# Patient Record
Sex: Female | Born: 1935 | Race: White | Hispanic: No | Smoking: Former smoker
Health system: Southern US, Community
[De-identification: ages and names within clinical notes are randomized; demographics above are authoritative.]

## PROBLEM LIST (undated history)

## (undated) DIAGNOSIS — F32A Depression, unspecified: Secondary | ICD-10-CM

## (undated) DIAGNOSIS — J189 Pneumonia, unspecified organism: Secondary | ICD-10-CM

## (undated) DIAGNOSIS — F329 Major depressive disorder, single episode, unspecified: Secondary | ICD-10-CM

## (undated) DIAGNOSIS — I2510 Atherosclerotic heart disease of native coronary artery without angina pectoris: Secondary | ICD-10-CM

## (undated) DIAGNOSIS — I209 Angina pectoris, unspecified: Secondary | ICD-10-CM

## (undated) DIAGNOSIS — R011 Cardiac murmur, unspecified: Secondary | ICD-10-CM

## (undated) DIAGNOSIS — M109 Gout, unspecified: Secondary | ICD-10-CM

## (undated) DIAGNOSIS — C9000 Multiple myeloma not having achieved remission: Principal | ICD-10-CM

## (undated) DIAGNOSIS — J42 Unspecified chronic bronchitis: Secondary | ICD-10-CM

## (undated) DIAGNOSIS — J449 Chronic obstructive pulmonary disease, unspecified: Secondary | ICD-10-CM

## (undated) DIAGNOSIS — Z9289 Personal history of other medical treatment: Secondary | ICD-10-CM

## (undated) DIAGNOSIS — M549 Dorsalgia, unspecified: Secondary | ICD-10-CM

## (undated) DIAGNOSIS — G8929 Other chronic pain: Secondary | ICD-10-CM

## (undated) DIAGNOSIS — I4891 Unspecified atrial fibrillation: Secondary | ICD-10-CM

## (undated) DIAGNOSIS — R0602 Shortness of breath: Secondary | ICD-10-CM

## (undated) DIAGNOSIS — I219 Acute myocardial infarction, unspecified: Secondary | ICD-10-CM

## (undated) DIAGNOSIS — N189 Chronic kidney disease, unspecified: Secondary | ICD-10-CM

## (undated) DIAGNOSIS — K644 Residual hemorrhoidal skin tags: Secondary | ICD-10-CM

## (undated) DIAGNOSIS — I10 Essential (primary) hypertension: Secondary | ICD-10-CM

## (undated) DIAGNOSIS — I25810 Atherosclerosis of coronary artery bypass graft(s) without angina pectoris: Secondary | ICD-10-CM

## (undated) DIAGNOSIS — E7800 Pure hypercholesterolemia, unspecified: Secondary | ICD-10-CM

## (undated) HISTORY — PX: APPENDECTOMY: SHX54

## (undated) HISTORY — PX: CHOLECYSTECTOMY: SHX55

## (undated) HISTORY — PX: ABDOMINAL HYSTERECTOMY: SHX81

## (undated) HISTORY — DX: Multiple myeloma not having achieved remission: C90.00

## (undated) HISTORY — PX: CATARACT EXTRACTION W/ INTRAOCULAR LENS  IMPLANT, BILATERAL: SHX1307

---

## 1968-12-06 HISTORY — PX: ECTOPIC PREGNANCY SURGERY: SHX613

## 1998-01-03 ENCOUNTER — Ambulatory Visit: Admission: RE | Admit: 1998-01-03 | Discharge: 1998-01-03 | Payer: Self-pay | Admitting: Pulmonary Disease

## 1998-08-23 ENCOUNTER — Encounter: Payer: Self-pay | Admitting: Emergency Medicine

## 1998-08-23 ENCOUNTER — Inpatient Hospital Stay (HOSPITAL_COMMUNITY): Admission: EM | Admit: 1998-08-23 | Discharge: 1998-08-26 | Payer: Self-pay | Admitting: Emergency Medicine

## 1999-03-19 ENCOUNTER — Other Ambulatory Visit: Admission: RE | Admit: 1999-03-19 | Discharge: 1999-03-19 | Payer: Self-pay | Admitting: *Deleted

## 1999-04-22 ENCOUNTER — Inpatient Hospital Stay (HOSPITAL_COMMUNITY): Admission: EM | Admit: 1999-04-22 | Discharge: 1999-04-24 | Payer: Self-pay | Admitting: Emergency Medicine

## 1999-04-22 ENCOUNTER — Encounter: Payer: Self-pay | Admitting: Interventional Cardiology

## 1999-06-05 ENCOUNTER — Inpatient Hospital Stay (HOSPITAL_COMMUNITY): Admission: AD | Admit: 1999-06-05 | Discharge: 1999-06-07 | Payer: Self-pay | Admitting: Interventional Cardiology

## 1999-06-05 ENCOUNTER — Encounter: Payer: Self-pay | Admitting: Interventional Cardiology

## 1999-06-13 ENCOUNTER — Emergency Department (HOSPITAL_COMMUNITY): Admission: EM | Admit: 1999-06-13 | Discharge: 1999-06-13 | Payer: Self-pay | Admitting: Emergency Medicine

## 2000-06-13 ENCOUNTER — Emergency Department (HOSPITAL_COMMUNITY): Admission: EM | Admit: 2000-06-13 | Discharge: 2000-06-13 | Payer: Self-pay | Admitting: Internal Medicine

## 2000-08-03 ENCOUNTER — Other Ambulatory Visit: Admission: RE | Admit: 2000-08-03 | Discharge: 2000-08-03 | Payer: Self-pay | Admitting: *Deleted

## 2001-08-06 ENCOUNTER — Encounter: Payer: Self-pay | Admitting: Cardiology

## 2001-08-06 ENCOUNTER — Inpatient Hospital Stay (HOSPITAL_COMMUNITY): Admission: EM | Admit: 2001-08-06 | Discharge: 2001-08-08 | Payer: Self-pay | Admitting: Emergency Medicine

## 2001-08-06 ENCOUNTER — Encounter: Payer: Self-pay | Admitting: Interventional Cardiology

## 2001-08-07 ENCOUNTER — Encounter: Payer: Self-pay | Admitting: Interventional Cardiology

## 2002-09-28 ENCOUNTER — Encounter: Admission: RE | Admit: 2002-09-28 | Discharge: 2002-09-28 | Payer: Self-pay | Admitting: Internal Medicine

## 2002-09-28 ENCOUNTER — Encounter: Payer: Self-pay | Admitting: Internal Medicine

## 2003-04-08 HISTORY — PX: CORONARY ARTERY BYPASS GRAFT: SHX141

## 2003-04-08 HISTORY — PX: CORONARY ANGIOPLASTY WITH STENT PLACEMENT: SHX49

## 2003-06-23 ENCOUNTER — Emergency Department (HOSPITAL_COMMUNITY): Admission: EM | Admit: 2003-06-23 | Discharge: 2003-06-23 | Payer: Self-pay | Admitting: Family Medicine

## 2003-06-30 ENCOUNTER — Emergency Department (HOSPITAL_COMMUNITY): Admission: EM | Admit: 2003-06-30 | Discharge: 2003-06-30 | Payer: Self-pay | Admitting: Family Medicine

## 2003-08-11 ENCOUNTER — Emergency Department (HOSPITAL_COMMUNITY): Admission: EM | Admit: 2003-08-11 | Discharge: 2003-08-11 | Payer: Self-pay | Admitting: Family Medicine

## 2004-02-06 ENCOUNTER — Inpatient Hospital Stay (HOSPITAL_COMMUNITY): Admission: EM | Admit: 2004-02-06 | Discharge: 2004-02-23 | Payer: Self-pay | Admitting: Emergency Medicine

## 2008-03-03 ENCOUNTER — Inpatient Hospital Stay (HOSPITAL_COMMUNITY): Admission: EM | Admit: 2008-03-03 | Discharge: 2008-03-07 | Payer: Self-pay | Admitting: Emergency Medicine

## 2008-03-05 ENCOUNTER — Encounter (INDEPENDENT_AMBULATORY_CARE_PROVIDER_SITE_OTHER): Payer: Self-pay | Admitting: Cardiology

## 2008-03-06 ENCOUNTER — Encounter (INDEPENDENT_AMBULATORY_CARE_PROVIDER_SITE_OTHER): Payer: Self-pay | Admitting: Surgery

## 2009-01-26 ENCOUNTER — Encounter: Admission: RE | Admit: 2009-01-26 | Discharge: 2009-01-26 | Payer: Self-pay | Admitting: Internal Medicine

## 2010-04-27 ENCOUNTER — Encounter: Payer: Self-pay | Admitting: Internal Medicine

## 2010-04-27 ENCOUNTER — Encounter: Payer: Self-pay | Admitting: Cardiothoracic Surgery

## 2010-04-28 ENCOUNTER — Encounter: Payer: Self-pay | Admitting: Internal Medicine

## 2010-08-20 NOTE — Op Note (Signed)
[name], [name]              ACCOUNT NO.:  [id number]   MEDICAL RECORD NO.:  [id number]          PATIENT TYPE:  INP   LOCATION:  3309                         FACILITY:  [facility]   PHYSICIAN:  Currie Paris, M.D.DATE OF BIRTH:  11/13/1931   DATE OF PROCEDURE:  03/06/2008  DATE OF DISCHARGE:                               OPERATIVE REPORT   PREOPERATIVE DIAGNOSIS:  Acute calculus cholecystitis.   POSTOPERATIVE DIAGNOSIS:  Acute calculus cholecystitis.   OPERATION:  Laparoscopic cholecystectomy with operative cholangiogram.   SURGEON:  Currie Paris, MD   ASSISTANT:  Letha Cape, PA   ANESTHESIA:  General.   CLINICAL HISTORY:  This is a 77 year old lady with coronary artery  disease, came in with what initially thought to be some chest pain, but  on further evaluation, was more epigastric, right upper quadrant, and  right shoulder.  Her cardiac workup was okay, and she was found to have  gallstones with some right upper quadrant tenderness.  It was felt that  her symptoms were biliary in origin.  After discussion with the patient  and her family, she elected to proceed to cholecystectomy.   DESCRIPTION OF PROCEDURE:  The patient was seen in the holding area, and  we went over the plans again, and she had no further questions.   She was taken to the operating room, and after satisfactory general  endotracheal anesthesia had been obtained, the abdomen was prepped and  draped.  The time-out was done.   I used 0.25% plain Marcaine for each incision.  I made the umbilical  incision, opened the fascia, and entered the peritoneal cavity.  There  appeared to be some omental adhesions, but I was able to get in and  place the Hasson cannula and insufflate the abdomen.   On placing the camera, I could see we had some flimsy adhesions, and I  was able to break through a thin window and gain access into the right  upper quadrant.  There were multiple other adhesions up  here.  Once I  was able to get up here, however, I was able to put a 5-mm trocar in the  right upper quadrant under direct vision.  Initially with that in place,  I could use the scissors and took down some midline adhesions to the  falciform, so that I had the midline opened up and could put a 10/11  trocar there under direct vision.  This was next done.  Then, with the  camera in the epigastric port, I was able to take the remaining midline  adhesions down enough to get good visualization of the umbilical port,  freed that up to be sure there was no bowel involved with these  adhesions that we had a free entry into the peritoneal cavity.   With that done, I put another 5-mm port laterally.  The patient was  placed in reverse Trendelenburg and tilted to the left.   The gallbladder was removed.  I opened the peritoneum over the cystic  duct and identified both the cystic duct and cystic artery, dissected  out a  nice window on a long segment of each.  The cystic duct was  clipped once as was the artery.  The cystic duct was then opened, and a  Cook catheter introduced, and an operative cholangiogram done, which  appeared normal to me.   The cystic duct catheter was removed, and 3 clips placed on the stay  side of the cystic duct.  Two additional clips were placed on the cystic  artery, and both were divided.  A small posterior branch of the cystic  artery was also identified and clipped as we removed the gallbladder.   The gallbladder was removed from below to above with coagulation current  of the cautery.  Once it was removed, I placed it in the bag.  We  irrigated.  I cauterized a few oozing places on the bed of the  gallbladder.  Once everything was dry and irrigated, I went ahead and  brought the gallbladder out the umbilical port.  The lateral ports were  removed under direct vision.  There was no bleeding.  The camera was  placed back in the epigastric port, and I reinspected to  make sure we  had had no injuries on entering the abdomen, and everything appeared to  be okay.  The umbilical site was closed with a purse-string.  The  abdomen was deflated through the epigastric site.  Skin was closed with  4-0 Monocryl subcuticular and Dermabond.   The patient tolerated the procedure well, and there were no  complications.  All counts were correct.      Currie Paris, M.D.  Electronically Signed     CJS/MEDQ  D:  03/06/2008  T:  03/07/2008  Job:  [id number]

## 2010-08-20 NOTE — H&P (Signed)
[name], [name]              ACCOUNT NO.:  [id number]   MEDICAL RECORD NO.:  [id number]          PATIENT TYPE:  INP   LOCATION:  3309                         FACILITY:  [facility]   PHYSICIAN:  Armanda Magic, M.D.     DATE OF BIRTH:  07/11/1931   DATE OF ADMISSION:  03/03/2008  DATE OF DISCHARGE:                              HISTORY & PHYSICAL   CHIEF COMPLAINT:  Chest pain.   HISTORY OF PRESENT ILLNESS:  [name] is a 77 year old female with 24  hours of constant left back sharp pain and substernal chest pressure.  She took sublingual nitroglycerin at home which did not help.  She came  to the ER and was placed on IV nitroglycerin and IV heparin drip that  helped somewhat.  She also complains of nausea, vomiting, and diarrhea  for 24 hours.   REVIEW OF SYSTEMS:  Otherwise negative.   PAST MEDICAL HISTORY:  She has coronary artery disease and undergone  bypass surgery in the past.  She has had multiple interventions of her  right coronary artery.  She has a history of hypertension, dyslipidemia,  asthma, COPD, obesity, and gout.   SOCIAL HISTORY:  She lives in [place] with her daughter and smokes 1  pack of cigarettes per day.  No alcohol or illicit drug use.   FAMILY HISTORY:  Mother died of a CVA.  Father died in his 8s of an  aneurysm.   MEDICATIONS:  1. Omega-3 Fish Oil 1000 mg 2 tablets daily.  2. Hydrochlorothiazide 25 mg a day.  3. Potassium gluconate 595 mg over-the-counter 2 tablets daily.  4. Zetia 10 mg nightly.  5. Restoril 15 mg nightly.  6. Coated aspirin 325 mg 2 tablets daily.  7. Colchicine 0.6 mg daily.   ALLERGIES:  CODEINE.   PHYSICAL EXAMINATION:  VITAL SIGNS:  Temperature 97.9, pulse 52, blood  pressure 136/78, respirations 28, O2 saturation 99% on 2L.  HEENT:  Grossly normal.  Conjunctivae normal.  Nares without drainage.  NECK: .  No carotid or subclavian bruits.  No JVD or thyromegaly.  CHEST:  Clear to auscultation bilaterally.  No  wheezing or rhonchi.  HEART:  Regular rate and rhythm.  No gallops or murmur.  ABDOMEN:  Good bowel sounds.  Nontender.  Nondistended.  No masses or  bruits.  EXTREMITY:  No lower extremity edema.  SKIN:  Warm and dry.  NEUROLOGIC:  Cranial nerves II through XII were grossly intact.  PSYCHIATRIC:  Normal mood and affect.   Chest x-ray no active disease.   LABORATORY STUDIES:  Hemoglobin 13.9, hematocrit 41.4, and platelets  172.  White count 6.4, sodium 130, potassium 3.2, BUN 30, creatinine  1.6, myoglobin 236, troponin normal.  EKG shows T-wave inversions in  lateral leads new from November 2005.   ASSESSMENT AND PLAN:  1. Chest pain atypical.  2. Hypokalemia, repleted.  3. Coronary artery disease.  4. History of coronary artery bypass graft.  5. Asthma.  6. Hypertension.  7. Borderline diabetes mellitus.  8. Diarrhea.   We will admit the patient and perform a chest CT to rule  out pulmonary  embolism.  We placed her on IV heparin to begin with.  We wanted to  switch her over to Lovenox, but  the patient refused this Lovenox shot.  We will continue IV nitroglycerin and start her on Protonix.  Also, we  will check her C. diff.  Monitor on telemetry.      Guy Franco, P.A.      Armanda Magic, M.D.  Electronically Signed    LB/MEDQ  D:  03/03/2008  T:  03/04/2008  Job:  [id number]   cc:   Georgann Housekeeper, MD  Lyn Records, M.D.

## 2010-08-20 NOTE — Consult Note (Signed)
[name], [name]              ACCOUNT NO.:  [id number]   MEDICAL RECORD NO.:  [id number]          PATIENT TYPE:  INP   LOCATION:                               FACILITY:  [facility]   PHYSICIAN:  Ollen Gross. Vernell Morgans, M.D. DATE OF BIRTH:  06/01/1931   DATE OF CONSULTATION:  03/05/2008  DATE OF DISCHARGE:                                 CONSULTATION   We were asked to see [name] in consultation by Dr. Ewing Schlein to evaluate  her for gallstones.   CHIEF COMPLAINT:  Abdominal pain.  [name] is a 77 year old white  female who presented a couple of days ago to the emergency department  with epigastric pain radiating to her back.  She has a history of  coronary artery disease and thought it might be her heart causing this  pain.  She was admitted by the cardiologist and worked up.  The workup  has not shown any cardiac source of her pain.  She also had an  ultrasound that did show stones in her gallbladder, but no gallbladder  wall thickening or ductal dilatation.  Her white counts normal.  Her  LFTs are normal.  She is continuing to have this pain, though this is  fairly constant.   PAST MEDICAL HISTORY:  Again significant for coronary artery disease,  hypertension, cholesterol, asthma, COPD, and gout.   PAST SURGICAL HISTORY:  Significant for coronary artery bypass grafting  and hysterectomy.   MEDICATIONS:  Omega-3, fish oil, hydrochlorothiazide, potassium, Zetia,  Restoril, aspirin, and colchicine.   ALLERGIES:  CODEINE.   SOCIAL HISTORY:  She smokes a pack of cigarettes a day and denies any  alcohol use.   FAMILY HISTORY:  Noncontributory.   PHYSICAL EXAMINATION:  GENERAL:  She is elderly white female in no acute  distress.  SKIN:  Warm and dry.  No jaundice.  EYES:  Extraocular movements intact.  Pupils equal, round, and reactive  to light.  Sclerae nonicteric.  LUNGS:  Clear bilaterally with no use of accessory inspiratory muscles.  HEART:  Regular rate and rhythm with  impulse in the left chest.  ABDOMEN:  Soft with some mild right upper quadrant tenderness, but no  guarding or peritonitis.  No palpable mass or hepatosplenomegaly.  EXTREMITIES:  No cyanosis, clubbing, or edema.  Good strength in arms  and legs.  PSYCHOLOGIC:  She is alert and oriented x3 with no evidence of anxiety  or depression.   Her labs and ultrasound were as mentioned above.   ASSESSMENT AND PLAN:  This is a 77 year old white female with what  sounds like symptomatic gallstones.  This certainly could account for  her symptoms.  It sounds as though her heart has been ruled out for  source of the pain.  At this point, I think she would probably benefit  from cholecystectomy.  I have discussed her in detail the  risks and benefits of operation to remove the gallbladder as well as  some of the technical aspects and she understands and wishes to proceed.  We will tentatively plan for this for tomorrow if she  is felt to be in  adequate cardiac risk for surgery by the cardiologists.       Ollen Gross. Vernell Morgans, M.D.  Electronically Signed     PST/MEDQ  D:  03/05/2008  T:  03/06/2008  Job:  [id number]

## 2010-08-20 NOTE — Consult Note (Signed)
NAMEKORAYMA, HAGWOOD              ACCOUNT NO.:  [id number]   MEDICAL RECORD NO.:  [id number]          PATIENT TYPE:  INP   LOCATION:  3309                         FACILITY:  [facility]   PHYSICIAN:  Petra Kuba, M.D.    DATE OF BIRTH:  1932-01-10   DATE OF CONSULTATION:  03/05/2008  DATE OF DISCHARGE:                                 CONSULTATION   HISTORY:  The patient was seen at request of Armanda Magic, although  primary patient of Verdis Prime with atypical chest pain.  She is ruled  out for an MI.  Pain does go to her back and also includes in her  abdomen.  She is doing better than she was.  This pain came on all of a  sudden on Thursday, did have some diarrhea as well on Thursday but it  has decreased 3 or 4 times yesterday and 2 times today.  She has not  seen any blood.  She is not having any previous GI workup, not sure if  she ever had a colonoscopy before, if she did it was years ago.  No  previous endoscopies or ultrasound, do not know if she had gallstones.  She is not having any nausea or vomiting and is currently doing better.  At home, has had some bulging but no other GI issues and no lower bowel  complaints.   PAST MEDICAL HISTORY:  Pertinent for coronary artery disease with a CABG  and stent.  She also has hypertension, gout, and hysterectomy.   FAMILY HISTORY:  Negative for any GI issues.   ALLERGIES:  To CODEINE.   MEDICINES AT HOME:  Aspirin, colchicine, HCTZ, and temazepam.   REVIEW OF SYSTEMS:  Negative except above.   CURRENT MEDICATIONS:  Currently in the hospital she is on aspirin, HCTZ,  Zetia, Ambien, colchicine, potassium, Protonix, heparin, nitroglycerin,  morphine, and Senokot.   PHYSICAL EXAMINATION:  GENERAL:  No acute distress, lying comfortably in  the bed.  LUNGS:  Clear with regular rate and rhythm.  ABDOMEN:  Soft and nontender, cannot duplicate the pain.   Ultrasound pertinent for stones, normal CBD, spiral CT ruled out PE.   LABORATORY DATA:  Pertinent for a hemoglobin which is dropped with  hydration from 14.3 to 11.7, normal white count, normal MCV, platelet  count is dropped from 172-123.  PT normal on admission.  BUN and  creatinine normal, was slightly dehydrated on admission.  SGOT 62  dropped to 46 with hydration, SGPT 44 dropped to 34, normal bilirubin,  alk phos, amylase, and lipase.  Albumin was 3.9 dropped to 3.1.  Ultrasound confirms multiple small stones and sludge with a normal size  common bile duct.  No other abnormalities.   ASSESSMENT:  1. Coronary artery disease.  2. Atypical chest and abdominal pain probably due to gallstones.  3. Minimal diarrhea questionably secondary to the gallstones as well.   PLAN:  Surgical consult today or tomorrow, discussed schedule to Dr.  Michaell Cowing and he will pass that along to either Dr. Corliss Skains or Dr. Jamey Ripa  tomorrow.  Pending their evaluation, they might consider  a lap chole  first, if okay with cardiology and an intraop cholangiogram versus a GI  workup first to rule out other etiologies although I think that be low  yield.  We will do an endoscopy and a colonoscopy if they requested.  She does not need a preop ERCP based on the very minimal increased liver  test which have decreased nicely.  We will follow with you.  I have  discussed all the above with the patient who agrees.           ______________________________  Petra Kuba, M.D.     MEM/MEDQ  D:  03/05/2008  T:  03/05/2008  Job:  [id number]   cc:   Lyn Records, M.D.

## 2010-08-23 NOTE — H&P (Signed)
[name], [name]              ACCOUNT NO.:  [id number]   MEDICAL RECORD NO.:  [id number]          PATIENT TYPE:  INP   LOCATION:  1829                         FACILITY:  [facility]   PHYSICIAN:  Lyn Records, M.D.   DATE OF BIRTH:  01-30-1932   DATE OF ADMISSION:  02/06/2004  DATE OF DISCHARGE:                                HISTORY & PHYSICAL   CHIEF COMPLAINT:  Significant chest pain.   HISTORY OF PRESENT ILLNESS:  [name] is a 77 year old white female  patient of Dr. Michaelle Copas, known history of CAD and MI, status post stent to  the RCA in 2000 after an MI with subsequent instant stenosis in January 2001  and then again March 2001, treated with brachytherapy. Her last cardiac  admission was in 2003 for complaint of chest pain. At that time a Cardiolite  study was negative for ischemia. She presents today to the ER with  complaints of being awakened last night with substernal chest tightness  about 2 a.m. associated with nausea and vomiting, radiating up to the left  arm and shoulder. She took 3 nitroglycerin without any total relief of  symptoms, but did minimize symptoms some. On further questioning of the  patient, she has been having these symptoms intermittently for several  months. These symptoms will last for multiple hours but not as long as last  night symptoms which lasted all night long. Again she describes epigastric  pain mainly occurring at night associated with diaphoresis and nausea. She  has been taking nitroglycerin 1-3 tablets per occurrence. She has been  having shortness of breath with exertion, but this is her chronic baseline  associated with her COPD. She does not have similar chest discomfort or  epigastric discomfort with any type of walking. In questioning her about her  previous cardiac presentation symptoms, she says these symptoms are exactly  the same.   REVIEW OF SYSTEMS:  Chronic acid reflux symptoms. Patient uses over-the-  counter medications  such as Tums, Gaviscon, Maalox, etc. She denies any  recent fevers, chills, or cough. No palpitations. No dizziness. No syncope.  No orthopnea. No abdominal pain. No hematochezia. No hematemesis or melena.  No pain in the legs with walking. No lower extremity swelling.   PAST MEDICAL HISTORY:  1.  Hypertension.  2.  Dyslipidemia.  3.  MI May 2000 status post stent to the RCA.  4.  Instant stenosis January 2001 and in March 2001 treated with      brachytherapy.  5.  Negative Cardiolite study for ischemia in 2003.  6.  Asthma.  7.  COPD.  8.  Obesity.  9.  Ongoing tobacco abuse.   FAMILY MEDICAL HISTORY:  Father, he died in his 81s of an aneurysm. Mother  died of a CVA.   SOCIAL HISTORY:  She is a smoker, down to one pack per day. She has been  smoking for at least 50 years. She does not utilize alcohol products. She  lives with her son. She still drives and they live in a mobile home.   ALLERGIES:  CODEINE.  MEDICATIONS:  1.  Toprol XL 50 mg daily.  2.  Zetia 10 mg daily.  3.  HCTZ 25 mg half tablet daily.  4.  Enteric coated aspirin 325 mg daily.  5.  Fish oil two caps daily.   PHYSICAL EXAMINATION:  GENERAL:  Pleasant female, currently without any  significant complaints of epigastric discomfort. Appropriate to  conversation.  VITAL SIGNS:  BP 130/70, heart rate 56, respirations 20, temperature 97.8.  NEUROLOGIC:  Patient is alert and oriented, moving all extremities x4. No  focal neurological deficits.  HEENT:  Head is normocephalic. Sclerae not injected.  NECK:  Supple no adenopathy.  CHEST:  Bilateral lung sounds are clear to auscultation without wheezing.  Respiratory effort not labored. Currently on room air.  CARDIAC:  Mostly S1, S2. There is a soft S4. No JVD. Carotids 2+ bilaterally  without bruits. In sinus rhythm. Apical radial pulse is regular.  ABDOMEN:  Soft, nondistended, nontender. Bowel sounds are present. No  hepatosplenomegaly, masses or bruits.   EXTREMITIES:  Symmetrical in appearance without edema, cyanosis or clubbing.  Pulses are 2+ bilaterally femoral, radial, pedal and dorsalis pedis.   LABS:  Sodium 139, potassium 3.8, chloride 105, BUN 20, glucose 109, pCO2 on  ABG is 39.5, bicarb 24, hemoglobin 13.9, hematocrit 41, creatinine 1.1,  white count 11,200, platelet count 197,000, neutrophils are normal at 68%  with a normal lymphocyte count of 27%, PT 13.1, INR 1, PTT 26, point of care  enzymes done in the ER are negative times 3 collections with troponin I  being less than 0.05. EKG is negative for any acute changes. She is having  some PVCs. Chest x-ray shows no CHF, cardiac enlargement.   IMPRESSION:  1.  Chest pain in patient with known coronary artery disease and prior      history of myocardial infarction and stent.  2.  Recurrent epigastric located chest pain, rule out potential      gastrointestinal etiology especially with history of problems related to      chronic reflux.  3.  Hypertension currently controlled.  4.  Dyslipidemia on Zetia and fish oil.  5.  Chronic obstructive pulmonary disease and asthma in patient with      continued tobacco abuse.  6.  Mild obesity.   PLAN:  Dr. Katrinka Blazing is going to admit the patient to the medical telemetry  unit. Routine serial cardiac isoenzymes. Continue home medications. Check a  lipase and amylase to help differentiate if this is possibly a GI etiology.  Continue IV nitroglycerin. Serial EKGs. Anticoagulation with heparin or  Lovenox. Continue aspirin as before. Initiate proton pump inhibitor with  Protonix. Again, based on patient's symptoms and additional diagnostic  workup, will proceed either with Cardiolite study versus diagnostic coronary  angiography.       ALE/MEDQ  D:  02/06/2004  T:  02/06/2004  Job:  [id number]   cc:   Georgann Housekeeper, MD  [address]  [place]  [place] [address] Fax: [phone]

## 2010-08-23 NOTE — H&P (Signed)
[place]. [facility]  Patient:    [name], [name]                     MRN: [id number] Adm. Date:  [id number] Attending:  Lyn Records. Iii Dictator:   Anselm Lis, N.P. CC:         Ammie Dalton, M.D.                         History and Physical  HISTORY OF PRESENT ILLNESS:  [name] is a pleasant 77 year old female, with  history of ongoing tobacco use and dyslipidemia, who is one month status post PTCA of in-stent restenosis of RCA.  She is now with two-day history of progressive angina pectoris which is suspected secondary to restenosis of RCA.  Chest discomfort has been somewhat responsive sublingual nitrate.  This morning, after she awoke, with 8 out 10 anterior chest pressure for which he took two sublingual nitrates and propped herself up.  She fell asleep but reawoke again approximately 4 oclock with continued though decreased discomfort.  The discomfort radiated to her left neck and left upper shoulder.  She had associated shortness of breath but no diaphoresis.  PAST MEDICAL HISTORY:  Coronary atherosclerotic heart disease, April 23, 1999. PTCA of RCA (diffuse) in-stent restenosis.  Residual left main 24%, mid-lucent AD 50-60% proximal and 70% after SP #2.  Good LV function.  May 2000, an inferior myocardial infarction with peaked CK 1640 and MB fraction 176.  She had a subsequent stent of the RCA.  Hysterectomy with USO in 1976 secondary to tubal pregnancy.  She had had a prior USO secondary to ruptured ovarian cyst. Questionable she may have had an appendectomy incident to her hysterectomy. Chronic obstructive pulmonary disease with ongoing tobacco use.  History of hypertension.  Benign head trauma, familiar.  Dyslipidemia on Lescol management via our [facility].  The patient denies history of diabetes mellitus, hypertension, cancer, nor GERD. She does have a "touch" of asthma.  ALLERGIES:  CODEINE causes pruritus and  headache.  MEDICATIONS: 1. Toprol XL 50 mg p.o. q.d. 2. Lescol 20 mg p.o. q.d. 3. Nitroglycerin 0.4 sublingual p.r.n. chest pain. 4. Enteric-coated aspirin 325 mg q.d.  SOCIAL HISTORY:  She smokes a pack of cigarettes per day for 40 years but is trying to cut down.  She has intention to quit when she enters in the hospital.  ETOH:  Negative.  She is widowed in 65 (second marriage).  She has a daughter and a on from her first marriage who are alive and well.  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  FAMILY HISTORY:  Father deceased of aneurysm in his 83s.  Mother deceased with [facility] after suffering a stroke.  She was in her 58s.  One brother alive and well.  REVIEW OF SYSTEMS:  She wears bifocals.  Decreased hearing bilaterally.  Denies  history of syncope nor near syncopal episodes but gets dizzy as she moves about and gets up to fast which is consistent for her.  Positive history for constipation for which she will take occasional over-the-counter laxatives.  Denies diarrhea, melena, nor bright red blood per rectum.  Negative hematuria.  No arthritic-type complaints.  Episodic pedal edema complaints since the morning.  Negative orthopnea.  History of nocturnal night sweats which is dependent on the side she is  lying on.  Negative palpation nor history of claudication.  PHYSICAL EXAMINATION:  VITAL SIGNS:  Blood pressure is 112/70 with heart rate 60 and regular.  Weight 158-1/2 pounds.  GENERAL:  She is a pleasantly conversant 77 year old female in no apparent distress.  Her good friend accompanies her today.  NECK:  Brisk bilateral carotid upstrokes without bruits, JVD, nor thyromegaly.  CARDIOVASCULAR:  Regular rate and rhythm without murmurs, rubs, or gallops. Normal S1 and S2.  Questionable soft S4.  CHEST:  Bibasilar crackles.  Negative CTA tenderness.  ABDOMEN:   Soft, nondistended, normal active bowel sounds.  Negative abdominal aortic nor femoral bruit.  EXTREMITIES:  There are +2/4 bilateral radial and femoral pulses.  Decreased pedal pulses, though good in warmth and color.  LABORATORY DATA:  CMET, CBC, chest x-ray, coagulase, and EKG are pending at the  time of this dictation.  IMPRESSION: 1. Unstable anginal pectoris in a 77 year old who is status post recent    percutaneous transluminal coronary angioplasty with in-stent restenosis    approximately one month earlier.  She has known stable moderate left anterior    descending coronary artery disease 60-70%.  There is a high suspicion of    restenosis of her right coronary artery. 2. Dyslipidemia on Lescol. 3. History of chronic obstructive pulmonary disease with ongoing tobacco use,    though she has cut down significantly and is hoping to quit with this    hospital admission.  PLAN:  Admit to hospital.  Rule out MI protocol with serial cardiac enzymes and  daily EKGs.  We will note that she has IV nitrate and heparin.  Anticipate coronary angiogram tomorrow with brachytherapy to restenotic RCA if indicated and able.  The risks, potential, benefits, and alternatives to the procedure is discussed n detail with [name].  The patient indicates her questions and concerns were addressed and is agreeable to proceed. DD:  06/05/99 TD:  06/05/99 Job: [id number]

## 2010-08-23 NOTE — Cardiovascular Report (Signed)
[place]. [facility]  Patient:    [name], [name]                     MRN: [id number] Proc. Date: 06/06/99 Adm. Date:  [id number] Disc. Date: [id number] Attending:  Lyn Records. Iii CC:         Ammie Dalton, M.D.             Darci Needle, M.D.             Cardiac Catheterization Lab                        Cardiac Catheterization  CINE NO.:  [id number].  PROCEDURES PERFORMED: 1. Left heart catheterization. 2. Selective coronary angiography. 3. Left ventriculography. 4. PTCA and brachy therapy of the right coronary stented region.  INDICATIONS:  Recurrent angina in a patient with diffuse in-stent restenosis on  April 23, 1999.  This study is being done to investigate for restenosis and to perform brachy therapy if there is clinical indication.  DESCRIPTION OF PROCEDURE:  After an informed consent was obtained, an 8-French sheath was started in the right femoral artery using the modified Seldinger technique.  A 6-French A2 multipurpose catheter was used for hemodynamic recordings, hand injection left ventriculography, and selective left coronary angiography.   A Judkins #4 6-French catheter was used for right coronary angiography.  After reviewing the cineangiograms, it was difficult to determine the exact source of the patients chest discomfort.  We did ultimately decide that there was moderate restenosis within the stents in the right coronary at just six weeks following angioplasty.  Because of this we felt that progression to more restenosis was highly likely, and that we should go ahead and treat the stented  area with brachy therapy.  The patient had been previously consented to have this procedure, and desires greatly not to have coronary bypass surgery.  Of concern is borderline lesions in the left coronary system, but they have been present since Aug 23, 1998 and apparently not changed in appearance over time.  She had  total  relief of symptoms after her most recent angioplasty in January 2001, with recurrence of symptoms over the past week.  We upgraded to an 8-French side hole JR4 catheter, and used a 300 cm long BMW wire. Heparin (6000 units) was administered intravenously.  We performed angioplasty within the stent and just distal to the stent; and proximal to the stent with a 30 mm long 3.0 mm Adante balloon.  Five balloon inflations were performed to a peak of 10 atm.  We then used a 3.25 x 15 mm long CrossSail balloon in the proximal part of the stent, where there is persistent indentation, to 13 atm.  Four balloon inflations were performed.  We then performed brachy therapy using the beta catheter system.  An 18.6 Gy was delivered.  We did an overlapping pullback procedure because of the length of the patients lesion.  This was discussed with Dr. Chipper Herb (the radiation oncologist).  We feel that we had overlap only within the stented area, and that this overlap area was no more than 5 mm.  We documented angiographic catheter positions during the case.  The ______ time for each brachy therapy run was 3 min 26 sec.  The patient tolerated the procedure without significant ischemia, and the post-angiographic procedure result was improved when compared to the preprocedure angiogram.  The patient left  the cardiac catheterization laboratory in stable condition without any evidence of ischemia or discomfort.  RESULTS:  HEMODYNAMIC DATA: 1. Aortic pressure:  177/81. 2. Left ventricular pressure:  179/22 mmHg.  LEFT VENTRICULOGRAPHY:  The left ventricle by hand injection demonstrates normal contractility.  Ejection fraction was estimated to be 60%.  SELECTIVE CORONARY ANGIOGRAPHY: 1. Left Main:  Free of significant obstruction. 2. Left Anterior Descending Coronary:  This is a large vessel that wraps around the    left ventricular apex.  There are two questionable areas of stenosis  within his    artery.  After the first septal perforator and near the origin of the first    diagonal there is an eccentric 50-70% stenosis.  Just distal to this is an    area of lucency that my obstruct the vessel by up to 50%.  The appearance of    this area of stenosis is unchanged when compared to the previous angiograms.  The distal vessel is diffusely diseased. 3. Circumflex Artery:  Gives origin to obtuse marginal branches.  The second obtuse    marginal bifurcates, as does the first obtuse marginal.  No significant    obstructive lesions are noted in the circumflex system.  Luminal irregularities    are noted to be present. 4. Right Coronary Artery:  The native right coronary contains obvious segmental  stenting, starting in the proximal vessel and extending to the distal vessel.    Within the proximal part of the stented area there is a 55-70% stenosis. Just    distal to the stent there is an area of lucency with up to 50% narrowing. This    angiographic appearance is present with the patient now only six weeks following    her prior angioplasty.  PERCUTANEOUS CORONARY INTERVENTION:  After angioplasty and performance of brachy therapy, there was an acceptable angiographic appearance.  There was less than 0% stenosis noted throughout the stent and distal to the stent.  We administered 18.6 Gy in each site.  The overlap site within the stent have received as much s 37 Gy.  CONCLUSIONS: 1. Moderate restenosis of the right coronary stent, with successful angioplasty  and brachy therapy with final acceptable angiographic result. 2. Borderline significant lesions in the LAD. 3. Normal left ventricular function.  RECOMMENDATIONS:  If the patient has recurrent symptoms she will need to have a  myocardial perfusion study to rule out evidence of anterior wall ischemia. Should anterior wall ischemia be demonstrated, angioplasty and stenting of the LAD would then be  pursued. DD:  06/06/99 TD:  06/07/99 Job: [id number]

## 2010-08-23 NOTE — Cardiovascular Report (Signed)
[name], [name]              ACCOUNT NO.:  [id number]   MEDICAL RECORD NO.:  [id number]          PATIENT TYPE:  INP   LOCATION:  3709                         FACILITY:  [facility]   PHYSICIAN:  Lyn Records III, M.D.DATE OF BIRTH:  22-Jul-1931   DATE OF PROCEDURE:  02/07/2004  DATE OF DISCHARGE:                              CARDIAC CATHETERIZATION   INDICATIONS:  Probable unstable angina, patient with prior history of right  coronary PCI including right coronary stent, restenosis, restenting, and  brachy therapy.   PROCEDURE PERFORMED:  1.  Left heart cath.  2.  Selective coronary angiography.  3.  Left ventriculography.   DESCRIPTION:  After informed consent, a 6 French sheath was placed in the  right femoral artery using the modified Seldinger technique.  A 6 French A2  multipurpose catheter was used for hemodynamic recordings, left  ventriculography by hand injection, and selective left coronary angiography.  The right coronary was injected using a JR catheter.  The case was then  terminated.   RESULTS:  I:  Hemodynamic data.  A.  Aortic pressure 174/71.  B.  Left ventricular  pressure 177/12 mmHg.   II:  Left ventriculography.  Left ventricle demonstrates mild inferobasal  hypokinesis.  EF 55%.  No mitral regurgitation of significance is noted.   III:  Coronary angiography.  A.  Left main coronary:  Patent.  B.  Left anterior descending  coronary:  The LAD is large and moderately  calcified, there is an eccentric 50-70% lesion after the first septal  perforator.  The LAD is transapical.  C.  Circumflex artery:  The circumflex gives origin to a large obtuse  marginal distally as well as a proximal obtuse marginal, both branches have  irregularities up to 50% narrowing.  D.  Right coronary artery:  The right coronary is occluded ostial with  catheter dampening.  There is an ostial 70% narrowing.  Within the stented  region, there is 75-85% narrowing in the  proximal/mid region.  This area had  been previously treated with stenting then brachy therapy in 2001.  The  distal artery is large giving PDA and left ventricular branches.   CONCLUSION:  1.  Segmental restenosis within a 30+ mm region of proximal and mid right      coronary stents that has been treated with brachy therapy in 2001.      There is, additionally, evidence of ostial significant disease involving      the right coronary.  There is also borderline significant eccentric      proximal LAD after the first septal perforator.  2.  Mild wall motion abnormality involving the inferobasal region, EF 55%.   PLAN:  Consider surgical revascularization since PCI on the right coronary  would require life long Plavix therapy, the patient would have a long region  of re-stenting including stenting of the ostium that may prevent future  coronary intubation.  Stenting within previously brachy therapy treated  regions increased for risk of subacute stent thrombosis.  If this were to  occur and we were not able to recannulate the right coronary, this  could be  a catastrophic situation.  Coronary artery bypass grafting may be a better  long term solution for this patient who continues to smoke and has  challenges with reference to staying on her medications.       HWS/MEDQ  D:  02/07/2004  T:  02/07/2004  Job:  [id number]

## 2010-08-23 NOTE — H&P (Signed)
[place]. [facility]  Patient:    [name]                      MRN: [id number] Adm. Date:  [id number] Attending:  Lyn Records. Iii Dictator:   Anselm Lis, N.P. CC:         Ammie Dalton, M.D.                         History and Physical  HISTORY OF PRESENT ILLNESS:  Ms. Kahler is a pleasant 77 year old who is status  post stent and an RCA subsequent to an acute inferior myocardial infarction seven months earlier.  She had known residual 75% midleft main, 75 to 80% LAD after ST#1, 50 to 70% diagonal 1; history of hypertension; history of ongoing tobacco abuse. Patient has had left anterior pressure/tightness which has been episodic approximately every months until three weeks prior to Christmas when he has had  increase in frequency and severity to approximately every other day.  Not particularly associated with activity and usually relieved with rest but now requiring up to three sublingual nitros which usually ease her symptoms. Discomfort usually lasts less than one half hour but last evening she developed  more severe intense discomfort rated 6 to 7 out of 10 on a pain scale.  She took three sublingual nitros with some easing of discomfort; she went to sleep.  She  awoke with chest discomfort, 4/10 and she was subsequently referred to emergency room by our clinic.  She denies associated nausea, diaphoresis, shortness of breath.  Discomfort does radiate to her back and "feels like gas".  She is not ure if this is similar to her prior heart attack last year.  Patient admits to being under a lot of stress lately as her son and his girlfriend have lost their jobs and she has been supporting them financially making their house payments (in conjunction with her daughter).  CARDIAC RISK FACTORS:  Known coronary artery disease, history of hypertension, ongoing tobacco abuse.  Dislipidemia.  PAST MEDICAL HISTORY:  1. Coronary  ____________     a. (May 2000) Inferior myocardial infarction with peak CK of 1640 and MB        fraction of 176 with subsequent stent of the right coronary artery.        Preserved left ventricular function with ejection fraction of 60%; negative        mitral regurgitation.  Residual disease 25% midleft main, 70 to 80% LAD        after ST#1; 50 to 70% after diagonal #1.     b. (November 2000) Adenosine Cardiolite revealing inferior infarct with  peri-infarct ischemia.  EF of 52% with inferobasal hypokinesis.  2. Hysterectomy with USO in 1976 secondary to a tubal pregnancy.  She had had  prior USO secondary to ruptured ovarian cyst.  Question she may have had     appendectomy incidentally to her hysterectomy.  3. Chronic obstructive pulmonary disease.  4. History of hypertension.  5. History of ongoing tobacco abuse.  6. Benign head tremor (familial).  7. Dyslipidemia (on Lescol).  The patient denies history of diabetes mellitus, hypertension, cancer or GERD. She does have a "touch" of asthma.  ALLERGIES:  CODEINE causes pruritis and headache.  MEDICATIONS ON ADMISSION:  1. Toprol XL 50 mg 1/2 tab p.o. q.d.  2. Enteric coated aspirin 325 mg a day.  3. Lescol  20 mg a day.  SOCIAL HISTORY AND HABITS:  She smokes a pack of cigarettes a day for the last 0 years.  ETOH negative.  She is widowed in 35 (second marriage).  She has a daughter and a son from her first marriage who are alive and well.  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  FAMILY HISTORY:  Father deceased of aneurysm at age 56s.  Mother deceased with [facility] 58 after suffering a stroke; she was in her 64s.  One brother alive and well.  REVIEW OF SYSTEMS:  She wears bifocals; decreased hearing bilaterally.  Denies history of syncope ____________ episodes but gets dizzy if she moves around and  gets up to fast which  is consistent for her.  Positive history of constipation or which she will take occasional over-the-counter laxatives.  Denies diarrhea, melena or bright red blood per rectum.  Negative hematuria.  No arthritic type complaints. Episodic pedal edema.  Complains of snoring.  Negative orthopnea.  History of nocturnal night sweats which is dependent on the side she is lying on. Negative palpitations or history of claudication.  PHYSICAL EXAMINATION:  VITAL SIGNS:  Blood pressure is 112/73, subsequently 86/55 after one sublingual  nitroglycerin.  Currently 100/51 post 250 ml normal saline bolus.  GENERAL:  She is an overweight older female appearing older than 67 years.  Her  daughter is in attendance.  She is complaining of ongoing mild anterior chest discomfort though it does not look in distress.  HEENT:  Brisk bilateral carotid upstroke without bruit.  NECK:  No significant JVD, no thyromegaly.  CARDIAC:  Regular rate and rhythm without murmur or rub; soft S4.  ABDOMEN:  Soft, nondistended, normal active bowel sounds.  Negative abdominal aortic, renal or femoral bruit.  No masses or organomegaly appreciated.  NEURO:  Cranial nerve II-XII intact; slight head tremor.  GENITORECTAL:  Deferred.  LABORATORY DATA:  EKG revealed normal sinus rhythm at 62 beats per minute without acute ischemic changes; subsequent EKG unchanged.  CK/MB and troponin I are pending.  CMET is pending.  CBC:  Hemoglobin 15.8, white blood cell count 6800,  platelet count 233,000.  Chest x-ray revealed cardiac enlargement with mild congestive heart failure. Pro time was 13.3, PTT 25 and INR of 1.1.  IMPRESSION: 1. Chest pain suspicious for unstable angina pectoris in a 77 year old who is    now seven months status post stent RCA.  She does have known residual 25%    left main, 70 to 80% LAD after SP#1; 50 to 70% LAD after diagonal #1.  A    Cardiolite some two months earlier revealed preserved EF of  52% with inferobasal    hypokinesis and evidence of old inferior MI with peri-infarct ischemia.  She is     currently having decreased though persistent anterior chest pain after one    sublingual nitrate. 2. Ongoing tobacco abuse. 3. History of hypertension; currently under good control. 4. History of chronic obstructive pulmonary disease with ongoing tobacco abuse.  PLAN: 1. Admit to TCU, rule out myocardial infarction protocol, serial cardiac enzymes    and daily EKG. 2. IV nitrates and heparin with possible initiation of Integrelin pending    cardiologists review. 3. Continue low dose beta blocker, aspirin, Lescol.  Will add Protonix. DD:  04/22/99 TD:  04/22/99 Job: [id number]

## 2010-08-23 NOTE — Op Note (Signed)
NAMEJENICKA, [name]              ACCOUNT NO.:  [id number]   MEDICAL RECORD NO.:  [id number]          PATIENT TYPE:  INP   LOCATION:  2310                         FACILITY:  [facility]   PHYSICIAN:  Kathlee Nations Trigt III, M.D.DATE OF BIRTH:  Jun 24, 1931   DATE OF PROCEDURE:  02/13/2004  DATE OF DISCHARGE:                                 OPERATIVE REPORT   OPERATION:  Coronary artery bypass grafting x 2 (left internal mammary  artery to the left anterior descending, saphenous vein graft to the right  coronary artery).   PREOPERATIVE DIAGNOSIS:  Class IV unstable angina with severe two vessel  coronary artery disease.   POSTOPERATIVE DIAGNOSIS:  Class IV unstable angina with severe two vessel  coronary artery disease.   SURGEON:  Kerin Perna, M.D.   ASSISTANT:  Jerold Coombe, P.A.-C.   ANESTHESIA:  General by Dr. Arta Bruce   INDICATIONS FOR PROCEDURE:  The patient is a 77 year old white female smoker  with a history of coronary artery disease status post prior percutaneous  intervention with stenting of the right coronary artery.  She represented  with recurrent angina and in-stent stenosis as well as 80% stenosis of the  proximal LAD.  She was felt to be a candidate for surgical  revascularization.  Prior to surgery, I examined the patient in her hospital  room and reviewed the results of the cardiac catheterization with the  patient and her son, Ike Bene.  I reviewed the indications and expected  benefits of coronary bypass surgery for treatment of her coronary artery  disease.  I reviewed the alternatives to surgical therapy for treatment of  her coronary artery disease.  I discussed with the patient the major aspects  of the planned procedure including the choice of conduit to include the  mammary artery and saphenous vein graft, the use of general anesthesia and  cardiopulmonary bypass, and the expected postoperative hospital recovery.  She understood that her long  smoking history and chronic obstructive lung  disease would increase her risks for surgery.  She understood the  alternatives to surgical therapy and the expected outcome of those  alternatives therapies.  I discussed with her the other risks to her of  coronary bypass surgery including the risks of MI, stroke, bleeding,  infection, and death.  She understood all these factors and agreed to  proceed with the operation as planned under what I felt was an informed  consent after our discussions over several days.   OPERATIVE FINDINGS:  The patient's saphenous vein was harvested from the  left thigh using an open technique.  There was no useable vein in the right  leg and the left leg vein below the knee was inadequate.  The mammary artery  was a small vessel but had adequate flow.  The circumflex vessels had a 40-  50% stenosis and were not grafted due to mild severity of disease and lack  of conduit.  The patient received one unit of packed cells in the operating  room for a hemoglobin of 7.9 grams while on cardiopulmonary bypass.   PROCEDURE:  The patient was brought to the operating room and placed supine  on the operating table.  General anesthesia was induced under invasive  hemodynamic monitoring.  The chest, abdomen, and legs were prepped with  Betadine and draped as a sterile field.  A sternal incision was made as the  saphenous vein was harvested from the left thigh using an open technique.  An endoscopic technique was used to examine the right thigh, but there was  no useable vein.  The left internal mammary artery was harvested as a  pedicle graft from its origin of the subclavian vessels.  It was a small  vessel but had adequate flow.  Heparin was administered for the aprotinin  protocol used for this operation.  The ACT was documented as being  therapeutic.  The sternal retractor was placed and the pericardium was  opened and elevated.  Through purse-strings placed in the  ascending aorta  and right atrium, the patient was cannulated and placed on bypass.  The  coronaries were identified for grafting.  The mammary artery and vein grafts  were prepared for the distal anastomoses.  A cardioplegia cannula was placed  in the ascending aorta.  The patient was cooled to 32 degrees.  The mammary  artery and vein graft were prepared for the distal anastomoses.  The aortic  Cross clamp was applied.  800 mL of cold blood cardioplegia was delivered to  the aortic root with good cardioplegic arrest.  Septal temperature was  measured and dropped to less than 10 degrees.  Topical iced saline slush was  used to augment myocardial preservation and a pericardial insufflator pad  was used to protect the left phrenic nerve.   The distal coronary anastomoses were then performed.  The first distal  anastomosis was to the distal right.  This was a 1.5 mm vessel with a  proximal 90% ostial stenosis.  A reversed saphenous vein was sewn end-to-  side with running 7-0 Prolene with good flow to the graft.  The second  distal anastomosis was to the distal third of the LAD.  This was a 1.5 mm  vessel with a proximal 80% stenosis.  The left IMA pedicle was brought  through an opening created in the left lateral pericardium and was brought  down to the LAD and sewn end-to-side with running 8-0 Prolene.  There was  excellent flow through the anastomosis with immediate rise in septal  temperature after release of the pedicle clamp on the mammary pedicle.  The  mammary pedicle was secured to the epicardium and the vascular bulldog clamp  was reapplied to the mammary pedicle.  While the Cross clamp was still in  place, the proximal vein anastomosis was performed on the ascending aorta  using a 4 mm punch.  Prior to releasing the Cross clamp, the air was vented  from the aorta.   The heart resumed a spontaneous rhythm after release of the Cross clamp. The vein graft was opened and the  mammary artery was opened and there was  good flow through each graft.  Hemostasis was documented at the proximal and  distal anastomoses.  The patient was rewarmed to 37 degrees.  Temporary  pacing wires were applied.  The lungs were re-expanded and the ventilator  was resumed.  The patient was weaned from bypass with stable blood pressure  and cardiac output.  She was on low dose Dopamine.  Protamine was  administered without adverse reaction.  The cannulae were removed.  The  mediastinum was irrigated with warm antibiotic irrigation.  The leg incision  was irrigated and closed in a standard fashion.  The superior pericardial  fat was closed over the aorta and vein grafts.  Two mediastinal and left  pleural chest tubes were placed and brought out through separate incisions.  The sternum was closed with interrupted steel wire.  The pectoralis fascia  was closed with a running #1 Vicryl.  The subcutaneous tissue and skin  layers were closed with running Vicryl and sterile dressings were applied.  Total bypass time was 65 minutes.  The Cross clamp time was 40 minutes.      Pete   PV/MEDQ  D:  02/13/2004  T:  02/13/2004  Job:  [id number]   cc:   Lyn Records III, M.D.  [address]  [address]  [place]  [place] [address]  Fax: [phone]

## 2010-08-23 NOTE — Consult Note (Signed)
[name], HAISLIP NO.:  [id number]   MEDICAL RECORD NO.:  [id number]          PATIENT TYPE:  INP   LOCATION:  3709                         FACILITY:  [facility]   PHYSICIAN:  Kathlee Nations Trigt III, M.D.DATE OF BIRTH:  [birth year]   DATE OF CONSULTATION:  02/07/2004  DATE OF DISCHARGE:                                   CONSULTATION   CONSULTING PHYSICIAN:  Kerin Perna, M.D.   REQUESTING PHYSICIAN:  Lyn Records, M.D.   PRIMARY CARE PHYSICIAN:  Georgann Housekeeper, M.D.   REASON FOR CONSULTATION:  Recurrent two-vessel coronary artery disease with  class IV unstable angina.   CHIEF COMPLAINT:  Chest pain, indigestion.   HISTORY OF PRESENT ILLNESS:  I was asked to evaluate this 77 year old female  for potential surgical coronary revascularization for a recently diagnosed  recurrent severe coronary disease.  The patient has a history of a DMI in  2000 and underwent a PCI with stent in the proximal segment of the right  coronary.  She subsequently reportedly had some restenosis in this area and  was treated with brachy therapy.  She has not had a cath in two years.  She  has had progressive recurrent exertional sub-epigastric indigestion and  discomfort.  This is usually relieved at rest.  She was admitted with these  symptoms and ruled out for MI with negative enzymes.  She underwent a  diagnostic cardiac catheterization by Dr. Katrinka Blazing which demonstrated ostial  restenosis of the right coronary of 80%, a proximal 75% stenosis of the LAD,  and mild disease of the circumflex circulation.  Her EF is 50-55% with some  inferior wall hypokinesia.  Her LVEDP is 16-mmHg.  It is felt that while  another PCI would be possible that surgical revascularization would be her  best long term option.   PAST MEDICAL HISTORY:  1.  History of coronary disease, status post DMI with PCI of the right      coronary.  2.  COPD with active smoking.  3.  Hypertension.  4.   Hyperlipidemia.   HOME MEDICATIONS:  1.  Toprol XL 25 mg every day.  2.  Zetia 10 mg p.o. every day.  3.  HCTZ 12.5 mg p.o. every day.  4.  Aspirin 325 mg p.o. every day.  5.  Fish oil.   ALLERGIES:  PENICILLIN causes a rash.   SOCIAL HISTORY:  She smokes one pack of cigarettes a day.  She denies  alcohol intake.  She is retired and lives with her son who works during the  day.  She is active taking care of the home, driving, etcetera.  Her son's  name is Ike Bene.   FAMILY HISTORY:  Positive for hypertension and stroke.   REVIEW OF SYSTEMS:  No significant recent change in weight loss, or fever,  or night sweats.  She has upper dental plates, lower native teeth.  She  denies difficulty swallowing.  She denies thoracic trauma, productive cough  at the current time, or recent flare up of her COPD.  She states she is  intolerant of prednisone  or albuterol.  Her GI review is negative for  jaundice, hepatitis, or blood per rectum.  Her epigastric pain is improved  since she has been placed on nitroglycerin.  She is currently also on  Lovenox.  UROLOGIC:  Negative.  VASCULAR:  Negative for DVT, TIA, or  claudication.  ENDOCRINE:  Negative for diabetes or thyroid disease.  NEUROLOGIC:  Negative for a stroke or a seizure.  She denies bleeding  disorder or prior blood transfusion.   SURGICAL HISTORY:  Hysterectomy (and she has one child, Ike Bene).   PHYSICAL EXAMINATION:  VITAL SIGNS:  She is 5' 2 and weighs 145 pounds,  blood pressure 130/60, heart rate 60 and regular, respirations 18.  GENERAL APPEARANCE:  Is that of a pleasant, elderly, white female in her  hospital room following a cardiac catheterization.  She is alert and  responsive.  HEENT:  Normocephalic.  Full EOMs.  Upper dental plate.  Pharynx clear.  NECK:  Without JVD, mass, or carotid bruit.  LYMPHATICS:  Reveal no palpable, supraclavicular, or axillary adenopathy.  THORAX:  Without deformity and breath sounds  are clear without wheezes.  CARDIAC:  Reveals a regular rhythm with a positive S4 gallop.  No murmur.  ABDOMEN:  Soft, nontender without organomegaly or pulsatile mass.  EXTREMITIES:  Reveal no clubbing, edema, or cyanosis, or tenderness.  Peripheral pulses are 2+ in the radial, femoral, and pedal areas.  There is  a compression dressing in the right groin at the femoral artery site of  catheterization.  SKIN:  With some mild atrophic changes.  No rash.  Some keratoses are  present on her posterior thorax in the skin.  NEUROLOGIC:  She is alert and oriented x 3 without focal motor deficit.   LABORATORY DATA:  Hematocrit 42, white count 11,000, creatinine 1.0,  platelet count 190,000.  Pro thrombin time 13.1.  Amylase and lipase normal.   IMPRESSION AND RECOMMENDATION:  The patient has significant symptoms of  unstable angina with severe two-vessel coronary disease.  Her best long term  option would be surgical revascularization over percutaneous coronary  intervention.  With her underlying medical factors of chronic obstructive  pulmonary disease and hypertension, her risk is slightly increased, however,  the expected overall benefit of surgical revascularization versus  percutaneous coronary intervention would still be in favor of surgery.  I  discussed the operation with the patient and she is agreeable to proceed.  Prior to surgery we will place on a Nicoderm patch, check her PFTs, and  place her on some pulmonary toilet and bronchodilator therapy and  expectorants.  The surgery will be scheduled for the morning of February 12, 2004.  Thank you very much for the consultation.      Pete   PV/MEDQ  D:  02/07/2004  T:  02/07/2004  Job:  [id number]

## 2010-08-23 NOTE — H&P (Signed)
[place]  Patient:    [name], CARBONI Visit Number: [id number] MRN: [id number]          Service Type: MED Location: 2000 2015 01 Attending Physician:  Lyn Records. Iii Dictated by:   Peter M. [place], M.D. Admit Date:  08/05/2001   CC:         Darci Needle, M.D.   History and Physical  HISTORY OF PRESENT ILLNESS:  Ms. Brubacher is a 77 year old white female who presents with complaints of chest pain.  Pain began as an interscapular pain approximately 5 p.m., then radiated to her anterior chest, described as a pressure-like pain.  She took several nitroglycerin at home with only partial relief.  She had no nausea, vomiting, diaphoresis or shortness of breath.  She does report increased asthma symptoms within the past month with the increased pollen.  She also developed swelling in her lower extremities two weeks ago that resolved after she took a fluid pill.  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  She developed in-stent restenosis in January of 2001 and again in March of 2001.  Her last treatment included brachytherapy.  PAST MEDICAL HISTORY:  Significant for hypertension, hyperlipidemia and asthma and COPD.  MEDICATIONS: 1. Toprol-XL 25 mg per day. 2. Aspirin daily.  The patient is not taking her cholesterol-lowering medications.  ALLERGIES:  CODEINE.  SOCIAL HISTORY:  The patient smokes 1/2 to 1 pack per day and has smoked for over 40 years.  She denies alcohol use.  She is widowed and has two children.  FAMILY HISTORY:  Father died with an aneurysm in his 19s.  Mother died of CVA.  REVIEW OF SYSTEMS:  Review of systems is otherwise unremarkable.  No significant cough or fever.  No claudication and no TIA symptoms.  PHYSICAL EXAMINATION:  GENERAL:  The patient is an obese white female in no apparent distress.  VITAL SIGNS:  Blood  pressure is 177/90.  Pulse 56 and regular.  She is afebrile.  HEENT:  She has a head tremor.  Pupils equal, round and reactive. Conjunctivae are clear.  Oropharynx is clear.  NECK:  Without JVD or bruits.  LUNGS:  A few scattered rhonchi.  CARDIAC:  Exam reveals a regular rate and rhythm without murmurs, rubs, gallops or clicks.  ABDOMEN:  Obese, soft and nontender.  EXTREMITIES:  Without edema.  Pulses are 2+ and symmetric.  LABORATORY AND ACCESSORY DATA:  ECG shows normal sinus rhythm and nonspecific T wave abnormality; this is unchanged from March of 2001.  Urinalysis is negative.  Other labs are pending.  IMPRESSION: 1. Chest pain, rule out myocardial infarction. 2. Status post inferior myocardial infarction with stenting of the right    coronary artery.  Two episodes of in-stent restenosis, last treated with    brachytherapy in March of 2001. 3. Hypertension. 4. Hyperlipidemia. 5. Chronic obstructive pulmonary disease/asthma. 6. Tobacco abuse.  PLAN:  The patient will be admitted to telemetry.  She will be treated with subcu Lovenox and IV nitroglycerin.  We will check a lipid panel.  We will keep n.p.o. for possible cardiac catheterization today. Dictated by:   Peter M. [place], M.D. Attending Physician:  Lyn Records. Iii DD:  08/06/01 TD:  08/07/01 Job: [id number]

## 2010-08-23 NOTE — Discharge Summary (Signed)
[name], [name]              ACCOUNT NO.:  [id number]   MEDICAL RECORD NO.:  [id number]          PATIENT TYPE:  INP   LOCATION:  2024                         FACILITY:  [facility]   PHYSICIAN:  Kerin Perna, M.D.  DATE OF BIRTH:  04/17/1931   DATE OF ADMISSION:  02/06/2004  DATE OF DISCHARGE:  02/23/2004                                 DISCHARGE SUMMARY   REFERRING PHYSICIAN:  Lyn Records, M.D.   ADMISSION DIAGNOSIS:  Unstable angina.   ADDITIONAL DISCHARGE DIAGNOSES:  1.  Known coronary artery disease with prior history of myocardial      infarction in May of 2000, status post stent to the right coronary      artery.  2.  History of in-stent stenosis in January 2001 and again in March of 2001,      treated with brachytherapy.  3.  Severe three vessel coronary artery disease not amenable to percutaneous      intervention, status post coronary artery bypass grafting x2 completed      on February 13, 2004.  4.  Postoperative paroxysmal atrial fibrillation.  5.  Chronic obstructive pulmonary disease.  6.  Hypertension.  7.  Dyslipidemia.  8.  Asthma.  9.  Obesity.  10. Ongoing tobacco abuse.   HOSPITAL MANAGEMENT/ PROCEDURES:  1.  Cardiac catheterization completed on February 07, 2004.  This revealed      segmental restenosis of the proximal and mid right coronary stents.  In      addition, there was evidence of significant coronary artery disease, not      amenable to percutaneous intervention.  The patient had mild wall motion      abnormalities involving the inferobasal region and an estimated ejection      fraction of 55%.  2.  Initiation of cardiac rehab phase I.  3.  Coronary artery bypass grafting x2, utilizing the left internal mammary      artery to the LAD and saphenous vein graft to the right coronary artery,      completed on February 13, 2004.   CONSULTATIONS:  1.  Cardiac rehab.  2.  Case management.  3.  Smoking cessation counselor.   HISTORY OF  PRESENT ILLNESS:  [name] is a 77 year old white female who is  a patient of Dr. Michaelle Copas of [facility].  The patient has a known  history of coronary artery disease and MI, status post stent to the right  coronary artery in 2000 with subsequent in-stent restenosis in January 2001  and then again in March of 2001.  The patient presented to the ER on  February 06, 2004, with complaints of being awakened at night with substernal  chest tightness which was associated with nausea and vomiting.  The pain  radiated up to the left arm and shoulder.  The patient took three  nitroglycerin without any total relief of symptoms, although it did minimize  the symptoms some.  The patient was admitted to [facility] for further management and evaluation.   HOSPITAL COURSE:  [name] was admitted to [facility]  on February 06, 2004, for further management and evaluation of angina.  Serial cardiac enzymes were drawn and were negative for acute myocardial  infarction.  The patient was taken to the cardiac catheterization lab on  February 07, 2004, and this study revealed segmental restenosis of the  proximal and mid right coronary stents.  In addition, there was evidence of  ostial significant disease involving the right coronary.  There was  borderline significant __________ proximal LAD after the first septal  perforator.  There was mild wall motion abnormality involving the  inferobasalar region with an estimated ejection fraction of 55%.  A surgical  consultation was initiated secondary to patient's high risk of restenosis of  stent placement as well as the risk associated with stenting within  previously brachytherapy treated regions.  Dr. Donata Clay of [facility] responded  to the consultation and felt that the patient indeed was a candidate for  coronary artery bypass grafting for reduction of risk for further myocardial  infarction and to improve  symptoms.   Over the next several hospital days, the patient remained hemodynamically  stable and free of chest pain.  She underwent preoperative evaluation with  arterial evaluation and pulmonary function testing as well as aggressive  pulmonary rehab.  The patient spoke briefly with the smoking cessation  counselor and expressed interest in quitting after surgery.  Otherwise, the  patient participated in cardiac rehab phase I and did fairly well with this.   The patient was taken to the operating room then on February 13, 2004, and  underwent coronary artery bypass grafting x2, utilizing the grafts as  described above.  The greater saphenous vein was harvested from the left  thigh using open technique.  Overall, the patient tolerated this procedure  well and was transferred to the surgical intensive care unit in critical but  stable condition.  While in the ICU, the patient was extubated on the  evening of her operative day.  Aggressive pulmonary toilet was initiated  immediately upon extubation and was continued throughout the remainder of  her hospitalization.   The patient's postoperative course was complicated by postoperative  paroxysmal atrial fibrillation.  This was treated with antiarrhythmic  medications and ultimately the patient maintained a normal sinus rhythm at  the time of discharge.  Otherwise, the patient continued to make slow but  steady progress toward recovery without any further complications.  Ultimately, she was deemed appropriate for initiation of discharge planning  on February 22, 2004, or postoperative day #9.  Her postoperative course was  slowed somewhat by her poor pulmonary status secondary to COPD, asthma and  ongoing tobacco abuse.  The patient will be discharged to home on home O2  for the short-term.  Otherwise, the patient resumed normal bowel and bladder  function.  She was tolerating a regular diet.  Her pain was well controlled with pain  medications.  Her incisions were healing well without evidence of  infection.  Her vital signs remained stable and she remained afebrile.   DISPOSITION:  [name] is being discharged to home in improved and stable  condition.  Will continue as planned with discharge in the a.m. of February 23, 2004, pending a.m. rounds and no change in the patient's clinical  status.   Lab values at the time of initiation of discharge planning are as follows:  CBC from February 22, 2004, reads wbc of 8.6, hemoglobin 10.7, hematocrit  31.3, platelets 480.  BMET  from February 22, 2004, reads sodium 136,  potassium 4.5, chloride 92, CO2 35, BUN 15, creatinine 1.2 and glucose of  112.   Chest x-ray from February 18, 2004, reads as follows:  There is an estimated  5% right pneumothorax which is improved from prior examination.  There was a  slight increase in perihilar and lower lobe atelectasis.  There was no  evidence of CHF.   DISCHARGE MEDICATIONS:  1.  Aspirin 325 mg daily.  2.  Toprol XL 25 mg daily.  3.  Zetia 10 mg daily.  4.  Nicotine patch 21 mg over 24 hours one patch transdermal daily.  5.  Humibid LA 600 mg twice daily for five more days.  6.  Nasonex 50 mcg one spray each nostril twice daily.  7.  Hydrochlorothiazide 25 mg half tablet daily.  8.  Diltiazem 180 mg daily.  9.  Fish oil capsules daily.  10. Tylox one to two tablets every four to six hours as needed for pain .   DISCHARGE INSTRUCTIONS:  1.  Activity:  The patient is to avoid driving.  She is to avoid heavy      lifting or strenuous activity.  She is to continue to walk daily.  She      should continue her breathing exercises for one more week.  2.  Diet:  The patient should follow a low fat, low salt, heart-healthy      diet.  3.  Wound care:  The patient may shower.  She should wash her incisions      daily with soap and water.  She should notify the [facility] office if she has      any redness, swelling or drainage from  her incision sites.   SPECIAL INSTRUCTIONS:  1.  The patient will be discharged with supplemental oxygen to maintain her      saturations greater than 88%.  2.  The patient will also receive  home health registered nurse visits per      cardiac surgery protocol.   FOLLOW UP:  1.  The patient is to schedule an appointment with Dr. Katrinka Blazing of [facility]      [facility]iology within two weeks of discharge.  She is to call to make this      appointment date and time.  2.  The patient is scheduled to see Dr. Donata Clay on Friday, March 22, 2004, at 12:15 p.m.  The patient should go to [facility] one hour prior to this appointment to obtain a PA and lateral      chest x-ray.      Eber Jones   CAF/MEDQ  D:  02/22/2004  T:  02/23/2004  Job:  [id number]   cc:   Lesleigh Noe, M.D.  [address]  [address]  [place]  [place] [address]  Fax: [phone]   Georgann Housekeeper, MD  [address]  [place]  [place] [address]  Fax: [phone]

## 2010-08-23 NOTE — Discharge Summary (Signed)
[name], [name]              ACCOUNT NO.:  [id number]   MEDICAL RECORD NO.:  [id number]          PATIENT TYPE:  INP   LOCATION:  3309                         FACILITY:  [facility]   PHYSICIAN:  Lyn Records, M.D.   DATE OF BIRTH:  Oct 19, 1931   DATE OF ADMISSION:  03/03/2008  DATE OF DISCHARGE:  03/07/2008                               DISCHARGE SUMMARY   DISCHARGE DIAGNOSES:  1. Cholelithiasis, status post cholecystectomy.  2. Back and abdominal pain secondary to cholelithiasis.  3. Sinus node dysfunction.  4. Coronary artery disease, history of coronary artery bypass      grafting.  5. Hypertension.  6. Dyslipidemia.  7. Asthma/chronic obstructive pulmonary disease.  8. Obesity.  9. Gout.   HOSPITAL COURSE:  [name] is a 77 year old female who was  admitted after a 24-hour episode of constant left flank pain sharp  from the front to her chest through her back that she describes more  like a pressure.  She states sublingual nitroglycerin did not help.  She  has had nausea, vomiting, and diarrhea for 24 hours.  The pain has been  constant for 24 hours.  In the emergency room, we placed her on IV  nitroglycerin and IV heparin.  This helps somewhat, but do not really  alleviate her pain.   Of note, she has had coronary artery disease with multiple percutaneous  interventions to the right coronary artery and in fact she has undergone  bypass surgery in the past.   She did have some bradycardia while in the hospital, beta-blockers were  on hold.  She was asymptomatic for this.  However, workup included lab  studies that showed total cholesterol of 115, triglycerides 96, LDL 82,  and HDL 14.  Cardiac enzymes were normal.  Hemoglobin 30.9, hematocrit  41.4, white count 6.4, and platelets 172.  TSH 0.474.  Hepatic function  panel is essentially normal with the exception of mildly elevated AST of  46.  Serum amylase 47 and lipase 26.   We were concerned for pulmonary  embolism and a CT angiogram of the chest  shows no acute pulmonary embolism.  We also got an abdominal ultrasound  that did show multiple gallstones with sludge with a contracted  gallbladder.  No ultrasound evidence for acute cholecystitis.   At this point, we obtained a consultation from not only the  Gastroenterology Service, but also the Surgery Service and ultimately  she required a cholecystectomy.  On March 06, 2008, she underwent  laparoscopic cholecystectomy with intraoperative cholangiogram.  She  tolerated the procedure well and on March 07, 2008, she was ready for  discharge to home.  She was discharged to home in stable and improved  condition.   DISCHARGE MEDICATIONS:  1. Percocet 1-2 tablets every 4 hours p.r.n. pain.  2. Hydrochlorothiazide 25 mg daily.  3. Colchicine 0.6 mg daily.  4. Restoril 15 mg at bedtime.  5. Zetia 10 mg daily.  6. Aspirin 325 mg a day.   May shower at home.  Cough or fever greater than 101.5, worsening  abdominal pain, redness, or pus-like drainage  from her incisions.  Follow up with the surgeon on March 21, 2008, at 2 p.m.  Follow up  with Dr. Verdis Prime in 1 month.  The patient is to call for this  appointment.  No restrictions on diet.  No lifting over 15 pounds for 2  weeks.  No driving for 1 week.  Increase activity slowly.  May walk up  steps.      [name], P.A.      Lyn Records, M.D.  Electronically Signed    LB/MEDQ  D:  05/17/2008  T:  05/18/2008  Job:  [id number]   cc:   Lyn Records, M.D.  Currie Paris, M.D.  Petra Kuba, M.D.

## 2011-01-07 LAB — BASIC METABOLIC PANEL
BUN: 16 mg/dL (ref 6–23)
BUN: 22 mg/dL (ref 6–23)
CO2: 28 mEq/L (ref 19–32)
Calcium: 7.9 mg/dL — ABNORMAL LOW (ref 8.4–10.5)
Chloride: 100 mEq/L (ref 96–112)
GFR calc non Af Amer: 53 mL/min — ABNORMAL LOW (ref >60)
GFR calc non Af Amer: >60 mL/min (ref >60)
Glucose, Bld: 111 mg/dL — ABNORMAL HIGH (ref 70–99)
Glucose, Bld: 92 mg/dL (ref 70–99)
Potassium: 3 mEq/L — ABNORMAL LOW (ref 3.5–5.1)
Sodium: 136 mEq/L (ref 135–145)

## 2011-01-07 LAB — COMPREHENSIVE METABOLIC PANEL
ALT: 40 U/L — ABNORMAL HIGH (ref 0–35)
AST: 53 U/L — ABNORMAL HIGH (ref 0–37)
Albumin: 3 g/dL — ABNORMAL LOW (ref 3.5–5.2)
Albumin: 3.9 g/dL (ref 3.5–5.2)
Alkaline Phosphatase: 126 U/L — ABNORMAL HIGH (ref 39–117)
BUN: 27 mg/dL — ABNORMAL HIGH (ref 6–23)
CO2: 28 mEq/L (ref 19–32)
Chloride: 106 mEq/L (ref 96–112)
Creatinine, Ser: 1.26 mg/dL — ABNORMAL HIGH (ref 0.4–1.2)
GFR calc Af Amer: >60 mL/min (ref >60)
GFR calc non Af Amer: 58 mL/min — ABNORMAL LOW (ref >60)
Potassium: 4.1 mEq/L (ref 3.5–5.1)
Total Bilirubin: 0.5 mg/dL (ref 0.3–1.2)
Total Protein: 6.9 g/dL (ref 6.0–8.3)

## 2011-01-07 LAB — APTT
aPTT: 104 seconds — ABNORMAL HIGH (ref 24–37)
aPTT: 113 seconds — ABNORMAL HIGH (ref 24–37)
aPTT: 139 seconds — ABNORMAL HIGH (ref 24–37)

## 2011-01-07 LAB — HEPATIC FUNCTION PANEL
ALT: 34 U/L (ref 0–35)
AST: 46 U/L — ABNORMAL HIGH (ref 0–37)
Alkaline Phosphatase: 87 U/L (ref 39–117)
Bilirubin, Direct: 0.1 mg/dL (ref 0.0–0.3)
Indirect Bilirubin: 0.6 mg/dL (ref 0.3–0.9)
Total Bilirubin: 0.7 mg/dL (ref 0.3–1.2)

## 2011-01-07 LAB — POCT CARDIAC MARKERS
CKMB, poc: <1 ng/mL — ABNORMAL LOW (ref 1.0–8.0)
Troponin i, poc: <0.05 ng/mL (ref 0.00–0.09)

## 2011-01-07 LAB — CARDIAC PANEL(CRET KIN+CKTOT+MB+TROPI)
CK, MB: 1.2 ng/mL (ref 0.3–4.0)
CK, MB: 1.4 ng/mL (ref 0.3–4.0)
Relative Index: 0.9 (ref 0.0–2.5)
Relative Index: 1 (ref 0.0–2.5)
Total CK: 138 U/L (ref 7–177)
Troponin I: 0.01 ng/mL (ref 0.00–0.06)
Troponin I: 0.01 ng/mL (ref 0.00–0.06)
Troponin I: 0.01 ng/mL (ref 0.00–0.06)
Troponin I: 0.02 ng/mL (ref 0.00–0.06)

## 2011-01-07 LAB — HEPARIN LEVEL (UNFRACTIONATED)
Heparin Unfractionated: 0.48 IU/mL (ref 0.30–0.70)
Heparin Unfractionated: 0.7 IU/mL (ref 0.30–0.70)

## 2011-01-07 LAB — DIFFERENTIAL
Eosinophils Absolute: 0 10*3/uL (ref 0.0–0.7)
Lymphs Abs: 2.3 10*3/uL (ref 0.7–4.0)
Monocytes Relative: 5 % (ref 3–12)
Neutro Abs: 3.7 10*3/uL (ref 1.7–7.7)
Neutrophils Relative %: 58 % (ref 43–77)

## 2011-01-07 LAB — CBC
HCT: 33.4 % — ABNORMAL LOW (ref 36.0–46.0)
HCT: 34.7 % — ABNORMAL LOW (ref 36.0–46.0)
HCT: 35.4 % — ABNORMAL LOW (ref 36.0–46.0)
Hemoglobin: 11.7 g/dL — ABNORMAL LOW (ref 12.0–15.0)
Hemoglobin: 12.1 g/dL (ref 12.0–15.0)
Hemoglobin: 13.9 g/dL (ref 12.0–15.0)
MCV: 97.5 fL (ref 78.0–100.0)
MCV: 97.9 fL (ref 78.0–100.0)
MCV: 98.1 fL (ref 78.0–100.0)
Platelets: 119 10*3/uL — ABNORMAL LOW (ref 150–400)
Platelets: 123 10*3/uL — ABNORMAL LOW (ref 150–400)
Platelets: 129 10*3/uL — ABNORMAL LOW (ref 150–400)
Platelets: 141 10*3/uL — ABNORMAL LOW (ref 150–400)
RBC: 3.56 MIL/uL — ABNORMAL LOW (ref 3.87–5.11)
RBC: 4.23 MIL/uL (ref 3.87–5.11)
RDW: 16.3 % — ABNORMAL HIGH (ref 11.5–15.5)
WBC: 4.7 10*3/uL (ref 4.0–10.5)
WBC: 4.9 10*3/uL (ref 4.0–10.5)
WBC: 6.2 10*3/uL (ref 4.0–10.5)
WBC: 6.4 10*3/uL (ref 4.0–10.5)
WBC: 6.4 10*3/uL (ref 4.0–10.5)

## 2011-01-07 LAB — CK TOTAL AND CKMB (NOT AT ARMC)
CK, MB: 1.6 ng/mL (ref 0.3–4.0)
Relative Index: 1.1 (ref 0.0–2.5)

## 2011-01-07 LAB — PROTIME-INR
INR: 1.1 (ref 0.00–1.49)
Prothrombin Time: 14.9 seconds (ref 11.6–15.2)
Prothrombin Time: 15.2 seconds (ref 11.6–15.2)

## 2011-01-07 LAB — TSH: TSH: 0.474 u[IU]/mL (ref 0.350–4.500)

## 2011-01-07 LAB — TROPONIN I: Troponin I: 0.01 ng/mL (ref 0.00–0.06)

## 2011-01-07 LAB — POCT I-STAT, CHEM 8
Chloride: 101 mEq/L (ref 96–112)
Creatinine, Ser: 1.6 mg/dL — ABNORMAL HIGH (ref 0.4–1.2)
Glucose, Bld: 97 mg/dL (ref 70–99)
Potassium: 3.2 mEq/L — ABNORMAL LOW (ref 3.5–5.1)

## 2011-01-07 LAB — AMYLASE: Amylase: 47 U/L (ref 27–131)

## 2011-01-07 LAB — LIPID PANEL: HDL: 14 mg/dL — ABNORMAL LOW (ref >39)

## 2011-01-10 LAB — CBC
MCHC: 34.5 g/dL (ref 30.0–36.0)
MCV: 97.3 fL (ref 78.0–100.0)
Platelets: 112 10*3/uL — ABNORMAL LOW (ref 150–400)
RBC: 3.49 MIL/uL — ABNORMAL LOW (ref 3.87–5.11)
RDW: 16.3 % — ABNORMAL HIGH (ref 11.5–15.5)

## 2011-01-10 LAB — APTT: aPTT: 31 seconds (ref 24–37)

## 2011-05-29 ENCOUNTER — Other Ambulatory Visit (HOSPITAL_COMMUNITY): Payer: Self-pay | Admitting: Internal Medicine

## 2011-05-29 DIAGNOSIS — J449 Chronic obstructive pulmonary disease, unspecified: Secondary | ICD-10-CM

## 2011-06-25 ENCOUNTER — Encounter (HOSPITAL_COMMUNITY): Payer: Self-pay

## 2011-07-15 ENCOUNTER — Encounter (HOSPITAL_COMMUNITY): Payer: Self-pay

## 2012-02-05 ENCOUNTER — Emergency Department (HOSPITAL_COMMUNITY): Payer: Medicare Other

## 2012-02-05 ENCOUNTER — Encounter (HOSPITAL_COMMUNITY): Payer: Self-pay | Admitting: Emergency Medicine

## 2012-02-05 ENCOUNTER — Observation Stay (HOSPITAL_COMMUNITY)
Admission: EM | Admit: 2012-02-05 | Discharge: 2012-02-06 | Disposition: A | Discharge status: Home / Self Care | Payer: Medicare Other | Attending: Interventional Cardiology | Admitting: Interventional Cardiology

## 2012-02-05 DIAGNOSIS — R142 Eructation: Secondary | ICD-10-CM | POA: Insufficient documentation

## 2012-02-05 DIAGNOSIS — J4489 Other specified chronic obstructive pulmonary disease: Secondary | ICD-10-CM | POA: Insufficient documentation

## 2012-02-05 DIAGNOSIS — R0789 Other chest pain: Principal | ICD-10-CM | POA: Insufficient documentation

## 2012-02-05 DIAGNOSIS — R079 Chest pain, unspecified: Secondary | ICD-10-CM

## 2012-02-05 DIAGNOSIS — E7800 Pure hypercholesterolemia, unspecified: Secondary | ICD-10-CM

## 2012-02-05 DIAGNOSIS — E785 Hyperlipidemia, unspecified: Secondary | ICD-10-CM | POA: Diagnosis present

## 2012-02-05 DIAGNOSIS — I252 Old myocardial infarction: Secondary | ICD-10-CM | POA: Insufficient documentation

## 2012-02-05 DIAGNOSIS — R141 Gas pain: Secondary | ICD-10-CM | POA: Insufficient documentation

## 2012-02-05 DIAGNOSIS — J449 Chronic obstructive pulmonary disease, unspecified: Secondary | ICD-10-CM | POA: Insufficient documentation

## 2012-02-05 DIAGNOSIS — K5730 Diverticulosis of large intestine without perforation or abscess without bleeding: Secondary | ICD-10-CM | POA: Insufficient documentation

## 2012-02-05 DIAGNOSIS — R0602 Shortness of breath: Secondary | ICD-10-CM | POA: Insufficient documentation

## 2012-02-05 DIAGNOSIS — Z23 Encounter for immunization: Secondary | ICD-10-CM | POA: Insufficient documentation

## 2012-02-05 DIAGNOSIS — K429 Umbilical hernia without obstruction or gangrene: Secondary | ICD-10-CM | POA: Insufficient documentation

## 2012-02-05 DIAGNOSIS — I2510 Atherosclerotic heart disease of native coronary artery without angina pectoris: Secondary | ICD-10-CM

## 2012-02-05 DIAGNOSIS — I10 Essential (primary) hypertension: Secondary | ICD-10-CM | POA: Insufficient documentation

## 2012-02-05 DIAGNOSIS — K219 Gastro-esophageal reflux disease without esophagitis: Secondary | ICD-10-CM | POA: Diagnosis present

## 2012-02-05 HISTORY — DX: Gout, unspecified: M10.9

## 2012-02-05 HISTORY — DX: Angina pectoris, unspecified: I20.9

## 2012-02-05 HISTORY — DX: Pneumonia, unspecified organism: J18.9

## 2012-02-05 HISTORY — DX: Other chronic pain: G89.29

## 2012-02-05 HISTORY — DX: Personal history of other medical treatment: Z92.89

## 2012-02-05 HISTORY — DX: Unspecified chronic bronchitis: J42

## 2012-02-05 HISTORY — DX: Acute myocardial infarction, unspecified: I21.9

## 2012-02-05 HISTORY — DX: Essential (primary) hypertension: I10

## 2012-02-05 HISTORY — DX: Major depressive disorder, single episode, unspecified: F32.9

## 2012-02-05 HISTORY — DX: Residual hemorrhoidal skin tags: K64.4

## 2012-02-05 HISTORY — DX: Chronic obstructive pulmonary disease, unspecified: J44.9

## 2012-02-05 HISTORY — DX: Atherosclerotic heart disease of native coronary artery without angina pectoris: I25.10

## 2012-02-05 HISTORY — DX: Pure hypercholesterolemia, unspecified: E78.00

## 2012-02-05 HISTORY — DX: Cardiac murmur, unspecified: R01.1

## 2012-02-05 HISTORY — DX: Depression, unspecified: F32.A

## 2012-02-05 HISTORY — DX: Dorsalgia, unspecified: M54.9

## 2012-02-05 HISTORY — DX: Shortness of breath: R06.02

## 2012-02-05 LAB — POCT I-STAT, CHEM 8
Glucose, Bld: 114 mg/dL — ABNORMAL HIGH (ref 70–99)
HCT: 40 % (ref 36.0–46.0)
Hemoglobin: 13.6 g/dL (ref 12.0–15.0)
Potassium: 3.9 mEq/L (ref 3.5–5.1)
Sodium: 142 mEq/L (ref 135–145)

## 2012-02-05 LAB — COMPREHENSIVE METABOLIC PANEL
ALT: 12 U/L (ref 0–35)
AST: 26 U/L (ref 0–37)
AST: 19 U/L (ref 0–37)
Albumin: 3.9 g/dL (ref 3.5–5.2)
BUN: 16 mg/dL (ref 6–23)
CO2: 30 mEq/L (ref 19–32)
Calcium: 9.2 mg/dL (ref 8.4–10.5)
Calcium: 8.9 mg/dL (ref 8.4–10.5)
Chloride: 101 mEq/L (ref 96–112)
Creatinine, Ser: 1.1 mg/dL (ref 0.50–1.10)
Creatinine, Ser: 1.11 mg/dL — ABNORMAL HIGH (ref 0.50–1.10)
GFR calc non Af Amer: 46 mL/min — ABNORMAL LOW (ref >90)
Sodium: 139 mEq/L (ref 135–145)
Total Bilirubin: 0.6 mg/dL (ref 0.3–1.2)

## 2012-02-05 LAB — CBC
MCHC: 33.8 g/dL (ref 30.0–36.0)
Platelets: 160 10*3/uL (ref 150–400)
RDW: 14.4 % (ref 11.5–15.5)
WBC: 5.3 10*3/uL (ref 4.0–10.5)

## 2012-02-05 LAB — CBC WITH DIFFERENTIAL/PLATELET
Basophils Absolute: 0 10*3/uL (ref 0.0–0.1)
Basophils Relative: 0 % (ref 0–1)
HCT: 38.5 % (ref 36.0–46.0)
Lymphocytes Relative: 36 % (ref 12–46)
MCHC: 34.8 g/dL (ref 30.0–36.0)
Monocytes Absolute: 0.3 10*3/uL (ref 0.1–1.0)
Neutro Abs: 3 10*3/uL (ref 1.7–7.7)
Platelets: 171 10*3/uL (ref 150–400)
RDW: 14.3 % (ref 11.5–15.5)
WBC: 5.2 10*3/uL (ref 4.0–10.5)

## 2012-02-05 LAB — D-DIMER, QUANTITATIVE: D-Dimer, Quant: 0.47 ug/mL-FEU (ref 0.00–0.48)

## 2012-02-05 LAB — TROPONIN I
Troponin I: <0.3 ng/mL (ref <0.30)
Troponin I: <0.3 ng/mL (ref <0.30)

## 2012-02-05 LAB — APTT: aPTT: 30 seconds (ref 24–37)

## 2012-02-05 LAB — PROTIME-INR: INR: 1.13 (ref 0.00–1.49)

## 2012-02-05 MED ORDER — IOHEXOL 350 MG/ML SOLN
100.0000 mL | Freq: Once | INTRAVENOUS | Status: AC | PRN
  Administered 2012-02-05: 100 mL via INTRAVENOUS

## 2012-02-05 MED ORDER — GI COCKTAIL ~~LOC~~
30.0000 mL | Freq: Once | ORAL | Status: AC
  Administered 2012-02-05: 30 mL via ORAL
  Filled 2012-02-05: qty 30

## 2012-02-05 MED ORDER — TEMAZEPAM 15 MG PO CAPS: 15.0000 mg | ORAL_CAPSULE | Freq: Every day | ORAL | Status: DC

## 2012-02-05 MED ORDER — ONDANSETRON HCL 4 MG/2ML IJ SOLN
4.0000 mg | Freq: Once | INTRAMUSCULAR | Status: AC
  Administered 2012-02-05: 4 mg via INTRAVENOUS
  Filled 2012-02-05: qty 2

## 2012-02-05 MED ORDER — HYDROCHLOROTHIAZIDE 25 MG PO TABS
25.0000 mg | ORAL_TABLET | Freq: Every day | ORAL | Status: DC
  Filled 2012-02-05 (×2): qty 1

## 2012-02-05 MED ORDER — ACETAMINOPHEN 325 MG PO TABS: 650.0000 mg | ORAL_TABLET | ORAL | Status: DC | PRN

## 2012-02-05 MED ORDER — ISOSORBIDE MONONITRATE ER 60 MG PO TB24
60.0000 mg | ORAL_TABLET | Freq: Every day | ORAL | Status: DC
  Filled 2012-02-05 (×2): qty 1

## 2012-02-05 MED ORDER — ASPIRIN EC 325 MG PO TBEC
325.0000 mg | DELAYED_RELEASE_TABLET | Freq: Every day | ORAL | Status: DC
  Administered 2012-02-05: 325 mg via ORAL
  Filled 2012-02-05 (×3): qty 1

## 2012-02-05 MED ORDER — DOCUSATE SODIUM 100 MG PO CAPS
100.0000 mg | ORAL_CAPSULE | Freq: Two times a day (BID) | ORAL | Status: DC
  Administered 2012-02-05: 100 mg via ORAL
  Filled 2012-02-05 (×3): qty 1

## 2012-02-05 MED ORDER — NITROGLYCERIN 0.4 MG SL SUBL: 0.4000 mg | SUBLINGUAL_TABLET | SUBLINGUAL | Status: DC | PRN

## 2012-02-05 MED ORDER — INFLUENZA VIRUS VACC SPLIT PF IM SUSP
0.5000 mL | INTRAMUSCULAR | Status: AC
  Administered 2012-02-06: 0.5 mL via INTRAMUSCULAR
  Filled 2012-02-05: qty 0.5

## 2012-02-05 MED ORDER — ASPIRIN 81 MG PO CHEW
324.0000 mg | CHEWABLE_TABLET | Freq: Once | ORAL | Status: AC
  Administered 2012-02-05: 324 mg via ORAL
  Filled 2012-02-05: qty 4

## 2012-02-05 MED ORDER — MORPHINE SULFATE 4 MG/ML IJ SOLN
4.0000 mg | Freq: Once | INTRAMUSCULAR | Status: AC
  Administered 2012-02-05: 4 mg via INTRAVENOUS
  Filled 2012-02-05: qty 1

## 2012-02-05 MED ORDER — EZETIMIBE 10 MG PO TABS
10.0000 mg | ORAL_TABLET | Freq: Every day | ORAL | Status: DC
  Administered 2012-02-05: 10 mg via ORAL
  Filled 2012-02-05 (×2): qty 1

## 2012-02-05 MED ORDER — TRAMADOL HCL 50 MG PO TABS: 50.0000 mg | ORAL_TABLET | Freq: Four times a day (QID) | ORAL | Status: DC | PRN

## 2012-02-05 MED ORDER — ONDANSETRON HCL 4 MG/2ML IJ SOLN: 4.0000 mg | Freq: Four times a day (QID) | INTRAMUSCULAR | Status: DC | PRN

## 2012-02-05 MED ORDER — POLYETHYLENE GLYCOL 3350 17 G PO PACK
17.0000 g | PACK | Freq: Every day | ORAL | Status: DC
  Filled 2012-02-05: qty 1

## 2012-02-05 MED ORDER — ASPIRIN 300 MG RE SUPP
300.0000 mg | RECTAL | Status: DC
  Filled 2012-02-05: qty 1

## 2012-02-05 MED ORDER — NITROGLYCERIN 0.4 MG SL SUBL
0.4000 mg | SUBLINGUAL_TABLET | SUBLINGUAL | Status: DC | PRN
  Administered 2012-02-05: 0.4 mg via SUBLINGUAL

## 2012-02-05 MED ORDER — ASPIRIN 81 MG PO CHEW: 324.0000 mg | CHEWABLE_TABLET | ORAL | Status: DC

## 2012-02-05 MED ORDER — ENOXAPARIN SODIUM 40 MG/0.4ML ~~LOC~~ SOLN
40.0000 mg | SUBCUTANEOUS | Status: DC
  Administered 2012-02-05: 40 mg via SUBCUTANEOUS
  Filled 2012-02-05 (×2): qty 0.4

## 2012-02-05 NOTE — ED Notes (Addendum)
Pt returned from CT. Resting comfortably. Family at bedside.

## 2012-02-05 NOTE — ED Notes (Signed)
Patient being transported to 2019.

## 2012-02-05 NOTE — ED Provider Notes (Signed)
History     CSN: [id number]  Arrival date & time 02/05/12  4098   First MD Initiated Contact with Patient 02/05/12 [phone]      Chief Complaint  Patient presents with  . Chest Pain    (Consider location/radiation/quality/duration/timing/severity/associated sxs/prior treatment) HPI Comments: Patient presents with substernal chest pain it radiates in the center of her back between her scapulas has been constant for the past hour and half. She has a history of a bypass with stents placed in 2005. Nitroglycerin at home which helped minimally. She denies any nausea or vomiting or sweating. She endorses some shortness of breath and aching in her chest that is similar to previous heart attack. She denies any pain or swelling in her legs.  The history is provided by the patient.    Past Medical History  Diagnosis Date  . COPD (chronic obstructive pulmonary disease)   . Gout     Past Surgical History  Procedure Date  . Cardiac surgery   . Double bipass   . Stents     History reviewed. No pertinent family history.  History  Substance Use Topics  . Smoking status: Former Games developer  . Smokeless tobacco: Not on file  . Alcohol Use: No    OB History    Grav Para Term Preterm Abortions TAB SAB Ect Mult Living                  Review of Systems  Constitutional: Negative for fever, activity change and appetite change.  HENT: Negative for congestion and rhinorrhea.   Respiratory: Positive for chest tightness and shortness of breath.   Cardiovascular: Positive for chest pain.  Gastrointestinal: Negative for nausea, vomiting and abdominal pain.  Genitourinary: Negative for dysuria and hematuria.  Musculoskeletal: Positive for back pain.  Skin: Negative for rash.  Neurological: Negative for dizziness and headaches.    Allergies  Codeine  Home Medications   Current Outpatient Rx  Name Route Sig Dispense Refill  . ASPIRIN EC 325 MG PO TBEC Oral Take 325 mg by mouth daily.    .  COQ-10 PO Oral Take 1 tablet by mouth daily.    [name] DOCUSATE SODIUM 100 MG PO CAPS Oral Take 100 mg by mouth 2 (two) times daily.    [name] EZETIMIBE 10 MG PO TABS Oral Take 10 mg by mouth daily.    [name] FISH OIL PO Oral Take 1 capsule by mouth 3 (three) times daily.    [name] TEMAZEPAM 15 MG PO CAPS Oral Take 15-30 mg by mouth at bedtime. 15 mg to start, may take additional 15 mg if needed    . TRAMADOL HCL 50 MG PO TABS Oral Take 50-100 mg by mouth every 6 (six) hours as needed. For pain    . VITAMIN E PO Oral Take 1 tablet by mouth daily.      BP 169/63  Pulse 46  Resp 18  Ht 5\' 2"  (1.575 m)  Wt 147 lb (66.679 kg)  BMI 26.89 kg/m2  SpO2 97%  Physical Exam  Constitutional: She is oriented to person, place, and time. She appears well-developed and well-nourished. She appears distressed.       uncomfortable  HENT:  Head: Normocephalic and atraumatic.  Mouth/Throat: Oropharynx is clear and moist. No oropharyngeal exudate.  Eyes: Conjunctivae normal and EOM are normal. Pupils are equal, round, and reactive to light.  Neck: Normal range of motion. Neck supple.  Cardiovascular: Normal rate, regular rhythm and normal heart sounds.  No murmur heard.      Equal DP pulses  Pulmonary/Chest: Effort normal and breath sounds normal. No respiratory distress.  Abdominal: Soft. There is no tenderness. There is no rebound and no guarding.  Musculoskeletal: She exhibits no edema and no tenderness.  Neurological: She is alert and oriented to person, place, and time. No cranial nerve deficit.       Equal grip strengths, Moving all extremities  Skin: Skin is warm.    ED Course  Procedures (including critical care time)  Labs Reviewed  CBC WITH DIFFERENTIAL - Abnormal; Notable for the following:    MCH 34.6 (*)     All other components within normal limits  COMPREHENSIVE METABOLIC PANEL - Abnormal; Notable for the following:    Glucose, Bld 116 (*)     GFR calc non Af Amer 47 (*)     GFR calc Af Amer 54  (*)     All other components within normal limits  POCT I-STAT, CHEM 8 - Abnormal; Notable for the following:    Glucose, Bld 114 (*)     Calcium, Ion 1.12 (*)     All other components within normal limits  TROPONIN I  D-DIMER, QUANTITATIVE  POCT I-STAT TROPONIN I  TROPONIN I   Ct Angio Chest W/cm &/or Wo Cm  02/05/2012  *RADIOLOGY REPORT*  Clinical Data:  Chest pain and shortness of breath.  Back pain.  CT ANGIOGRAPHY CHEST, ABDOMEN AND PELVIS  Technique:  Multidetector CT imaging through the chest, abdomen and pelvis was performed using the standard protocol during bolus administration of intravenous contrast.  Multiplanar reconstructed images including MIPs were obtained and reviewed to evaluate the vascular anatomy.  Contrast: OMNIPAQUE IOHEXOL 350 MG/ML SOLN  Comparison:  Chest CT 03/04/2008.  CTA CHEST  Findings:  Mediastinum: On precontrast images there is no crescentic high attenuation associated with the wall of the thoracic aorta to suggest acute intramural hemorrhage.  Although there is extensive atherosclerosis of the thoracic aorta and great vessels of the mediastinum, there is no evidence of thoracic aortic dissection, penetrating aortic ulcer or large ulcerated plaque at this time. Mild ectasia of the ascending thoracic aorta (4.2 cm in diameter) is only slightly increased from the prior study from 03/04/2008, at which point the ascending thoracic aorta measured approximately 4 cm in diameter.  There are atherosclerotic calcifications in the left main, left anterior descending, left circumflex and right coronary arteries.  There is also a stent in the proximal and mid right coronary artery, which appears to be occluded.  However, the patient is status post median sternotomy for CABG with a grossly patent LIMA to the distal LAD, and a grossly patent saphenous vein graft to the distal RCA circulation (this non gated CT examination is insufficient to accurately assess for subtle areas  of stenosis within either of these grafts). There is a small hiatal hernia. No pathologically enlarged mediastinal or hilar lymph nodes. Although the study was not specifically tailored for evaluation of pulmonary embolism, the pulmonary arteries are well opacified on this examination, and there are no definite central, lobar or segmental sized filling defects to suggest clinically relevant pulmonary embolism.  Lungs/Pleura: A small area of scarring in the inferior segment of the lingula.  No acute consolidative airspace disease.  No pleural effusions.  No suspicious appearing pulmonary nodules or masses. Mild bilateral apical pleuroparenchymal thickening, compatible with scarring.  Musculoskeletal: Sternotomy wires. There are no aggressive appearing lytic or blastic lesions noted in the  visualized portions of the skeleton.  Sclerotic lesion in the superior aspect of T4 is unchanged, and most compatible with a bone island.   Review of the MIP images confirms the above findings.  IMPRESSION: 1.  No findings to suggest acute aortic syndrome at this time. 2.  Mild ectasia of the ascending thoracic aorta (4.2 cm in diameter), only slightly increased compared to prior study 03/04/2008 (at which point it measured 4 cm in diameter). 3. Atherosclerosis, including left main and three-vessel coronary artery disease.  Status post median sternotomy for CABG with grossly patent LIMA to the LAD and SVG to distal RCA, as above. 4. Although the study was not specifically tailored for evaluation of pulmonary embolism, there is no evidence to suggest clinically relevant central, lobar or segmental sized embolus. 5.  Small hiatal hernia.  CTA ABDOMEN AND PELVIS  Findings:  Abdomen/Pelvis:  Although there is extensive atherosclerosis of the abdominal and pelvic vasculature, there is no definite aneurysm or dissection noted at this time.  Renal arteries are patent bilaterally, although, there is a suggestion of a greater than 50%  stenosis at the ostium of the left renal artery.  There is also a small accessory renal artery to the lower pole of the right kidney. The celiac axis, superior mesenteric artery and inferior mesenteric arteries are all grossly patent, as are their major branches at this time.  Status post cholecystectomy.  The appearance of the liver, pancreas, spleen and right adrenal gland is unremarkable.  Mild adreniform thickening of the left adrenal gland suggestive of hyperplasia.  Cortical thinning is noted in both of the kidneys, and there are numerous tiny low-attenuation renal lesions bilaterally (left greater than right) which are too small to definitively characterize, but are statistically likely to represent small cysts.  In addition, there is an 11 mm simple cyst extending exophytically from the lower pole of the left kidney.  No ascites or pneumoperitoneum and no pathologic distension of small bowel.  No definite pathologic lymphadenopathy identified within the abdomen or pelvis.  There are numerous colonic diverticula, particularly of the sigmoid colon, without surrounding inflammatory changes to suggest acute diverticulitis at this time. Tiny umbilical hernia containing only a small amount of omental fat is incidentally noted.  Status post hysterectomy.  Ovaries are not confidently identified and may either be surgically absent or atrophic.  Urinary bladder is unremarkable in appearance.  Musculoskeletal: There are no aggressive appearing lytic or blastic lesions noted in the visualized portions of the skeleton.   Review of the MIP images confirms the above findings.  IMPRESSION:  1.  No acute findings in the abdomen or pelvis. 2.  Although there is extensive atherosclerosis in the abdominal and pelvic vasculature, there is no evidence of aneurysm or dissection at this time.  However, there is a stenosis at the ostium of the left renal artery which may be hemodynamically significant.  Clinical correlation for  signs and symptoms of renovascular hypertension is recommended. 3.  Status post cholecystectomy. 4.  Colonic diverticulosis (particularly of the sigmoid colon), without acute inflammatory changes to suggest an acute colonic diverticulitis at this time. 5.  Additional incidental findings, as above.   Original Report Authenticated By: Trudie Reed, M.D.    Dg Chest Portable 1 View  02/05/2012  *RADIOLOGY REPORT*  Clinical Data: Chest pain and shortness of breath.  PORTABLE CHEST - 1 VIEW  Comparison: Chest x-ray 01/07/2011.  Findings: Lung volumes are slightly low.  There is an opacity in the periphery  of the left base obscuring the lateral costophrenic margin, which may represent a focus of subsegmental atelectasis or a tiny area of peripheral air space consolidation.  The possibility of a trace left pleural effusion is not excluded.  Lungs are otherwise clear.  No evidence of pulmonary edema.  Heart size is borderline enlarged.  Mediastinal contours are unremarkable. Atherosclerosis in the thoracic aorta.  Status post median sternotomy for CABG with a LIMA.  IMPRESSION: 1.  Opacity in the periphery of the left lung base is favored to represent subsegmental atelectasis.  However, clinical correlation for signs and symptoms of infection are recommended as this could represent a focus of early pneumonia.  Alternatively, if no infectious symptoms are noted, and if the patient has pleuritic chest pain, the possibility of a small area of pulmonary infarction from pulmonary embolism may warrant consideration, and further evaluation with PE protocol chest CT may be appropriate. 2.  Atherosclerosis.   Original Report Authenticated By: Trudie Reed, M.D.    Ct Cta Abd/pel W/cm &/or W/o Cm  02/05/2012  *RADIOLOGY REPORT*  Clinical Data:  Chest pain and shortness of breath.  Back pain.  CT ANGIOGRAPHY CHEST, ABDOMEN AND PELVIS  Technique:  Multidetector CT imaging through the chest, abdomen and pelvis was  performed using the standard protocol during bolus administration of intravenous contrast.  Multiplanar reconstructed images including MIPs were obtained and reviewed to evaluate the vascular anatomy.  Contrast: OMNIPAQUE IOHEXOL 350 MG/ML SOLN  Comparison:  Chest CT 03/04/2008.  CTA CHEST  Findings:  Mediastinum: On precontrast images there is no crescentic high attenuation associated with the wall of the thoracic aorta to suggest acute intramural hemorrhage.  Although there is extensive atherosclerosis of the thoracic aorta and great vessels of the mediastinum, there is no evidence of thoracic aortic dissection, penetrating aortic ulcer or large ulcerated plaque at this time. Mild ectasia of the ascending thoracic aorta (4.2 cm in diameter) is only slightly increased from the prior study from 03/04/2008, at which point the ascending thoracic aorta measured approximately 4 cm in diameter.  There are atherosclerotic calcifications in the left main, left anterior descending, left circumflex and right coronary arteries.  There is also a stent in the proximal and mid right coronary artery, which appears to be occluded.  However, the patient is status post median sternotomy for CABG with a grossly patent LIMA to the distal LAD, and a grossly patent saphenous vein graft to the distal RCA circulation (this non gated CT examination is insufficient to accurately assess for subtle areas of stenosis within either of these grafts). There is a small hiatal hernia. No pathologically enlarged mediastinal or hilar lymph nodes. Although the study was not specifically tailored for evaluation of pulmonary embolism, the pulmonary arteries are well opacified on this examination, and there are no definite central, lobar or segmental sized filling defects to suggest clinically relevant pulmonary embolism.  Lungs/Pleura: A small area of scarring in the inferior segment of the lingula.  No acute consolidative airspace disease.  No  pleural effusions.  No suspicious appearing pulmonary nodules or masses. Mild bilateral apical pleuroparenchymal thickening, compatible with scarring.  Musculoskeletal: Sternotomy wires. There are no aggressive appearing lytic or blastic lesions noted in the visualized portions of the skeleton.  Sclerotic lesion in the superior aspect of T4 is unchanged, and most compatible with a bone island.   Review of the MIP images confirms the above findings.  IMPRESSION: 1.  No findings to suggest acute aortic syndrome at  this time. 2.  Mild ectasia of the ascending thoracic aorta (4.2 cm in diameter), only slightly increased compared to prior study 03/04/2008 (at which point it measured 4 cm in diameter). 3. Atherosclerosis, including left main and three-vessel coronary artery disease.  Status post median sternotomy for CABG with grossly patent LIMA to the LAD and SVG to distal RCA, as above. 4. Although the study was not specifically tailored for evaluation of pulmonary embolism, there is no evidence to suggest clinically relevant central, lobar or segmental sized embolus. 5.  Small hiatal hernia.  CTA ABDOMEN AND PELVIS  Findings:  Abdomen/Pelvis:  Although there is extensive atherosclerosis of the abdominal and pelvic vasculature, there is no definite aneurysm or dissection noted at this time.  Renal arteries are patent bilaterally, although, there is a suggestion of a greater than 50% stenosis at the ostium of the left renal artery.  There is also a small accessory renal artery to the lower pole of the right kidney. The celiac axis, superior mesenteric artery and inferior mesenteric arteries are all grossly patent, as are their major branches at this time.  Status post cholecystectomy.  The appearance of the liver, pancreas, spleen and right adrenal gland is unremarkable.  Mild adreniform thickening of the left adrenal gland suggestive of hyperplasia.  Cortical thinning is noted in both of the kidneys, and there are  numerous tiny low-attenuation renal lesions bilaterally (left greater than right) which are too small to definitively characterize, but are statistically likely to represent small cysts.  In addition, there is an 11 mm simple cyst extending exophytically from the lower pole of the left kidney.  No ascites or pneumoperitoneum and no pathologic distension of small bowel.  No definite pathologic lymphadenopathy identified within the abdomen or pelvis.  There are numerous colonic diverticula, particularly of the sigmoid colon, without surrounding inflammatory changes to suggest acute diverticulitis at this time. Tiny umbilical hernia containing only a small amount of omental fat is incidentally noted.  Status post hysterectomy.  Ovaries are not confidently identified and may either be surgically absent or atrophic.  Urinary bladder is unremarkable in appearance.  Musculoskeletal: There are no aggressive appearing lytic or blastic lesions noted in the visualized portions of the skeleton.   Review of the MIP images confirms the above findings.  IMPRESSION:  1.  No acute findings in the abdomen or pelvis. 2.  Although there is extensive atherosclerosis in the abdominal and pelvic vasculature, there is no evidence of aneurysm or dissection at this time.  However, there is a stenosis at the ostium of the left renal artery which may be hemodynamically significant.  Clinical correlation for signs and symptoms of renovascular hypertension is recommended. 3.  Status post cholecystectomy. 4.  Colonic diverticulosis (particularly of the sigmoid colon), without acute inflammatory changes to suggest an acute colonic diverticulitis at this time. 5.  Additional incidental findings, as above.   Original Report Authenticated By: Trudie Reed, M.D.      1. Chest pain       MDM  Chest pain radiating to the back similar to previous heart attack. EKG unchanged. We'll obtain labs and imaging to rule out dissection. Troponin  negative, d-dimer negative.  CT negative for dissection or pulmonary embolism. Patient's pain persists to improve. Given additional medications, GI cocktail, Zofran and morphine. Discussed with Dr. Anne Fu of cardiology who will admit for observation rule out.   Date: 02/05/2012  Rate: 63  Rhythm: normal sinus rhythm  QRS Axis: normal  Intervals:  PR prolonged  ST/T Wave abnormalities: nonspecific ST/T changes  Conduction Disutrbances:none  Narrative Interpretation:   Old EKG Reviewed: unchanged  CRITICAL CARE Performed by: Glynn Octave   Total critical care time: 30  Critical care time was exclusive of separately billable procedures and treating other patients.  Critical care was necessary to treat or prevent imminent or life-threatening deterioration.  Critical care was time spent personally by me on the following activities: development of treatment plan with patient and/or surrogate as well as nursing, discussions with consultants, evaluation of patient's response to treatment, examination of patient, obtaining history from patient or surrogate, ordering and performing treatments and interventions, ordering and review of laboratory studies, ordering and review of radiographic studies, pulse oximetry and re-evaluation of patient's condition.      Glynn Octave, MD 02/05/12 [phone]

## 2012-02-05 NOTE — Progress Notes (Signed)
Patient stated that she consumes Restoril 15 mg at night before bed time to help with sleep. Wants the doctor to prescribe while she is here. Harmon Pier

## 2012-02-05 NOTE — ED Notes (Signed)
Pt aware of plan for admission

## 2012-02-05 NOTE — ED Notes (Signed)
Pt woke up today with cp. Pt states 5/10 achy pain radiating from center of chest to center of back between scapulas. Hx of COPD, currently sob. Pt felt dizzy, weak, took nitro SL x1 before arrival. Pt had double bi-pass and stents placed in 2005. Pt A&Ox4.

## 2012-02-05 NOTE — ED Notes (Signed)
Patient walked to restroom with NAD. Patient resting while watching tv with NAD

## 2012-02-05 NOTE — H&P (Signed)
Admit date: 02/05/2012 Primary Physician  : Dr. Donette Larry Primary Cardiologist  Dr. Katrinka Blazing  CC: Chest/back pain  HPI: 78 year old female with prior bypass surgery, prior myocardial infarction who was asked to go to the emergency department after describing chest pain that was mostly right sided radiating to her mid back with mild associated shortness of breath. She denied any nausea, sweating. No edema. No fevers, cough. No emesis. She thought this was gas pain. She was concerned because she had similar discomfort prior to her myocardial infarction before her bypass. While I saw her in the emergency room, she was chest pain-free, feeling well. Her first set of cardiac markers is normal. CT scan was done of her aorta which was normal. No obvious pulmonary seen. EKG showed sinus rhythm with nonspecific ST flattening. Telemetry demonstrated occasional sinus bradycardia into the 50s.    PMH:   Past Medical History  Diagnosis Date  . COPD (chronic obstructive pulmonary disease)   . Gout     PSH:   Past Surgical History  Procedure Date  . Cardiac surgery   . Double bipass   . Stents    Allergies:  Albuterol sulfate; Codeine; Lexapro; Plavix; Soma; and Wellbutrin Prior to Admit Meds:   (Not in a hospital admission) Fam HX:   History reviewed. No pertinent family history. Social HX:    History   Social History  . Marital Status: Widowed    Spouse Name: N/A    Number of Children: N/A  . Years of Education: N/A   Occupational History  . Not on file.   Social History Main Topics  . Smoking status: Former Games developer  . Smokeless tobacco: Not on file  . Alcohol Use: No  . Drug Use: No  . Sexually Active:    Other Topics Concern  . Not on file   Social History Narrative  . No narrative on file     ROS:  All 11 ROS were addressed and are negative except what is stated in the HPI  Physical Exam: Blood pressure 168/80, pulse 54, resp. rate 17, height 5\' 2"  (1.575 m), weight 66.679 kg  (147 lb), SpO2 95.00%.    General: Well developed, well nourished, in no acute distress Head: Eyes PERRLA, No xanthomas.   Normal cephalic and atramatic  Lungs:   Clear bilaterally to auscultation and percussion. Normal respiratory effort. No wheezes, no rales. Heart:   HRRR S1 S2 Pulses are 2+ & equal. No significant murmurs, chest wall scar healed.           No carotid bruit. No JVD.  No abdominal bruits. No femoral bruits. Abdomen: Bowel sounds are positive, abdomen soft and non-tender without masses  No hepatosplenomegaly. Msk:  Back normal, normal gait. Normal strength and tone for age. Extremities:   No clubbing, cyanosis or edema.  DP +1 Neuro: Alert and oriented X 3, non-focal, MAE x 4 GU: Deferred Rectal: Deferred Psych:  Good affect, responds appropriately    Labs:   Lab Results  Component Value Date   WBC 5.2 02/05/2012   HGB 13.6 02/05/2012   HCT 40.0 02/05/2012   MCV 99.5 02/05/2012   PLT 171 02/05/2012    Lab 02/05/12 1031 02/05/12 1001  NA 142 --  K 3.9 --  CL 101 --  CO2 -- 28  BUN 21 --  CREATININE 1.10 --  CALCIUM -- 9.2  PROT -- 7.5  BILITOT -- 0.6  ALKPHOS -- 53  ALT -- 12  AST -- 26  GLUCOSE 114* --   Troponin normal.   Radiology:  Ct Angio Chest W/cm &/or Wo Cm  02/05/2012  *RADIOLOGY REPORT*  Clinical Data:  Chest pain and shortness of breath.  Back pain.  CT ANGIOGRAPHY CHEST, ABDOMEN AND PELVIS  Technique:  Multidetector CT imaging through the chest, abdomen and pelvis was performed using the standard protocol during bolus administration of intravenous contrast.  Multiplanar reconstructed images including MIPs were obtained and reviewed to evaluate the vascular anatomy.  Contrast: OMNIPAQUE IOHEXOL 350 MG/ML SOLN  Comparison:  Chest CT 03/04/2008.  CTA CHEST  Findings:  Mediastinum: On precontrast images there is no crescentic high attenuation associated with the wall of the thoracic aorta to suggest acute intramural hemorrhage.  Although  there is extensive atherosclerosis of the thoracic aorta and great vessels of the mediastinum, there is no evidence of thoracic aortic dissection, penetrating aortic ulcer or large ulcerated plaque at this time. Mild ectasia of the ascending thoracic aorta (4.2 cm in diameter) is only slightly increased from the prior study from 03/04/2008, at which point the ascending thoracic aorta measured approximately 4 cm in diameter.  There are atherosclerotic calcifications in the left main, left anterior descending, left circumflex and right coronary arteries.  There is also a stent in the proximal and mid right coronary artery, which appears to be occluded.  However, the patient is status post median sternotomy for CABG with a grossly patent LIMA to the distal LAD, and a grossly patent saphenous vein graft to the distal RCA circulation (this non gated CT examination is insufficient to accurately assess for subtle areas of stenosis within either of these grafts). There is a small hiatal hernia. No pathologically enlarged mediastinal or hilar lymph nodes. Although the study was not specifically tailored for evaluation of pulmonary embolism, the pulmonary arteries are well opacified on this examination, and there are no definite central, lobar or segmental sized filling defects to suggest clinically relevant pulmonary embolism.  Lungs/Pleura: A small area of scarring in the inferior segment of the lingula.  No acute consolidative airspace disease.  No pleural effusions.  No suspicious appearing pulmonary nodules or masses. Mild bilateral apical pleuroparenchymal thickening, compatible with scarring.  Musculoskeletal: Sternotomy wires. There are no aggressive appearing lytic or blastic lesions noted in the visualized portions of the skeleton.  Sclerotic lesion in the superior aspect of T4 is unchanged, and most compatible with a bone island.   Review of the MIP images confirms the above findings.  IMPRESSION: 1.  No findings  to suggest acute aortic syndrome at this time. 2.  Mild ectasia of the ascending thoracic aorta (4.2 cm in diameter), only slightly increased compared to prior study 03/04/2008 (at which point it measured 4 cm in diameter). 3. Atherosclerosis, including left main and three-vessel coronary artery disease.  Status post median sternotomy for CABG with grossly patent LIMA to the LAD and SVG to distal RCA, as above. 4. Although the study was not specifically tailored for evaluation of pulmonary embolism, there is no evidence to suggest clinically relevant central, lobar or segmental sized embolus. 5.  Small hiatal hernia.  CTA ABDOMEN AND PELVIS  Findings:  Abdomen/Pelvis:  Although there is extensive atherosclerosis of the abdominal and pelvic vasculature, there is no definite aneurysm or dissection noted at this time.  Renal arteries are patent bilaterally, although, there is a suggestion of a greater than 50% stenosis at the ostium of the left renal artery.  There is also a small accessory renal artery  to the lower pole of the right kidney. The celiac axis, superior mesenteric artery and inferior mesenteric arteries are all grossly patent, as are their major branches at this time.  Status post cholecystectomy.  The appearance of the liver, pancreas, spleen and right adrenal gland is unremarkable.  Mild adreniform thickening of the left adrenal gland suggestive of hyperplasia.  Cortical thinning is noted in both of the kidneys, and there are numerous tiny low-attenuation renal lesions bilaterally (left greater than right) which are too small to definitively characterize, but are statistically likely to represent small cysts.  In addition, there is an 11 mm simple cyst extending exophytically from the lower pole of the left kidney.  No ascites or pneumoperitoneum and no pathologic distension of small bowel.  No definite pathologic lymphadenopathy identified within the abdomen or pelvis.  There are numerous colonic  diverticula, particularly of the sigmoid colon, without surrounding inflammatory changes to suggest acute diverticulitis at this time. Tiny umbilical hernia containing only a small amount of omental fat is incidentally noted.  Status post hysterectomy.  Ovaries are not confidently identified and may either be surgically absent or atrophic.  Urinary bladder is unremarkable in appearance.  Musculoskeletal: There are no aggressive appearing lytic or blastic lesions noted in the visualized portions of the skeleton.   Review of the MIP images confirms the above findings.  IMPRESSION:  1.  No acute findings in the abdomen or pelvis. 2.  Although there is extensive atherosclerosis in the abdominal and pelvic vasculature, there is no evidence of aneurysm or dissection at this time.  However, there is a stenosis at the ostium of the left renal artery which may be hemodynamically significant.  Clinical correlation for signs and symptoms of renovascular hypertension is recommended. 3.  Status post cholecystectomy. 4.  Colonic diverticulosis (particularly of the sigmoid colon), without acute inflammatory changes to suggest an acute colonic diverticulitis at this time. 5.  Additional incidental findings, as above.   Original Report Authenticated By: Trudie Reed, M.D.    Dg Chest Portable 1 View  02/05/2012  *RADIOLOGY REPORT*  Clinical Data: Chest pain and shortness of breath.  PORTABLE CHEST - 1 VIEW  Comparison: Chest x-ray 01/07/2011.  Findings: Lung volumes are slightly low.  There is an opacity in the periphery of the left base obscuring the lateral costophrenic margin, which may represent a focus of subsegmental atelectasis or a tiny area of peripheral air space consolidation.  The possibility of a trace left pleural effusion is not excluded.  Lungs are otherwise clear.  No evidence of pulmonary edema.  Heart size is borderline enlarged.  Mediastinal contours are unremarkable. Atherosclerosis in the thoracic aorta.   Status post median sternotomy for CABG with a LIMA.  IMPRESSION: 1.  Opacity in the periphery of the left lung base is favored to represent subsegmental atelectasis.  However, clinical correlation for signs and symptoms of infection are recommended as this could represent a focus of early pneumonia.  Alternatively, if no infectious symptoms are noted, and if the patient has pleuritic chest pain, the possibility of a small area of pulmonary infarction from pulmonary embolism may warrant consideration, and further evaluation with PE protocol chest CT may be appropriate. 2.  Atherosclerosis.   Original Report Authenticated By: Trudie Reed, M.D.    Ct Cta Abd/pel W/cm &/or W/o Cm  02/05/2012  *RADIOLOGY REPORT*  Clinical Data:  Chest pain and shortness of breath.  Back pain.  CT ANGIOGRAPHY CHEST, ABDOMEN AND PELVIS  Technique:  Multidetector CT imaging through the chest, abdomen and pelvis was performed using the standard protocol during bolus administration of intravenous contrast.  Multiplanar reconstructed images including MIPs were obtained and reviewed to evaluate the vascular anatomy.  Contrast: OMNIPAQUE IOHEXOL 350 MG/ML SOLN  Comparison:  Chest CT 03/04/2008.  CTA CHEST  Findings:  Mediastinum: On precontrast images there is no crescentic high attenuation associated with the wall of the thoracic aorta to suggest acute intramural hemorrhage.  Although there is extensive atherosclerosis of the thoracic aorta and great vessels of the mediastinum, there is no evidence of thoracic aortic dissection, penetrating aortic ulcer or large ulcerated plaque at this time. Mild ectasia of the ascending thoracic aorta (4.2 cm in diameter) is only slightly increased from the prior study from 03/04/2008, at which point the ascending thoracic aorta measured approximately 4 cm in diameter.  There are atherosclerotic calcifications in the left main, left anterior descending, left circumflex and right coronary  arteries.  There is also a stent in the proximal and mid right coronary artery, which appears to be occluded.  However, the patient is status post median sternotomy for CABG with a grossly patent LIMA to the distal LAD, and a grossly patent saphenous vein graft to the distal RCA circulation (this non gated CT examination is insufficient to accurately assess for subtle areas of stenosis within either of these grafts). There is a small hiatal hernia. No pathologically enlarged mediastinal or hilar lymph nodes. Although the study was not specifically tailored for evaluation of pulmonary embolism, the pulmonary arteries are well opacified on this examination, and there are no definite central, lobar or segmental sized filling defects to suggest clinically relevant pulmonary embolism.  Lungs/Pleura: A small area of scarring in the inferior segment of the lingula.  No acute consolidative airspace disease.  No pleural effusions.  No suspicious appearing pulmonary nodules or masses. Mild bilateral apical pleuroparenchymal thickening, compatible with scarring.  Musculoskeletal: Sternotomy wires. There are no aggressive appearing lytic or blastic lesions noted in the visualized portions of the skeleton.  Sclerotic lesion in the superior aspect of T4 is unchanged, and most compatible with a bone island.   Review of the MIP images confirms the above findings.  IMPRESSION: 1.  No findings to suggest acute aortic syndrome at this time. 2.  Mild ectasia of the ascending thoracic aorta (4.2 cm in diameter), only slightly increased compared to prior study 03/04/2008 (at which point it measured 4 cm in diameter). 3. Atherosclerosis, including left main and three-vessel coronary artery disease.  Status post median sternotomy for CABG with grossly patent LIMA to the LAD and SVG to distal RCA, as above. 4. Although the study was not specifically tailored for evaluation of pulmonary embolism, there is no evidence to suggest clinically  relevant central, lobar or segmental sized embolus. 5.  Small hiatal hernia.  CTA ABDOMEN AND PELVIS  Findings:  Abdomen/Pelvis:  Although there is extensive atherosclerosis of the abdominal and pelvic vasculature, there is no definite aneurysm or dissection noted at this time.  Renal arteries are patent bilaterally, although, there is a suggestion of a greater than 50% stenosis at the ostium of the left renal artery.  There is also a small accessory renal artery to the lower pole of the right kidney. The celiac axis, superior mesenteric artery and inferior mesenteric arteries are all grossly patent, as are their major branches at this time.  Status post cholecystectomy.  The appearance of the liver, pancreas, spleen and right adrenal gland  is unremarkable.  Mild adreniform thickening of the left adrenal gland suggestive of hyperplasia.  Cortical thinning is noted in both of the kidneys, and there are numerous tiny low-attenuation renal lesions bilaterally (left greater than right) which are too small to definitively characterize, but are statistically likely to represent small cysts.  In addition, there is an 11 mm simple cyst extending exophytically from the lower pole of the left kidney.  No ascites or pneumoperitoneum and no pathologic distension of small bowel.  No definite pathologic lymphadenopathy identified within the abdomen or pelvis.  There are numerous colonic diverticula, particularly of the sigmoid colon, without surrounding inflammatory changes to suggest acute diverticulitis at this time. Tiny umbilical hernia containing only a small amount of omental fat is incidentally noted.  Status post hysterectomy.  Ovaries are not confidently identified and may either be surgically absent or atrophic.  Urinary bladder is unremarkable in appearance.  Musculoskeletal: There are no aggressive appearing lytic or blastic lesions noted in the visualized portions of the skeleton.   Review of the MIP images confirms  the above findings.  IMPRESSION:  1.  No acute findings in the abdomen or pelvis. 2.  Although there is extensive atherosclerosis in the abdominal and pelvic vasculature, there is no evidence of aneurysm or dissection at this time.  However, there is a stenosis at the ostium of the left renal artery which may be hemodynamically significant.  Clinical correlation for signs and symptoms of renovascular hypertension is recommended. 3.  Status post cholecystectomy. 4.  Colonic diverticulosis (particularly of the sigmoid colon), without acute inflammatory changes to suggest an acute colonic diverticulitis at this time. 5.  Additional incidental findings, as above.   Original Report Authenticated By: Trudie Reed, M.D.      EKG:  As above in history of present illness Personally viewed.  ASSESSMENT/PLAN:   78 year old female with coronary artery disease status post prior myocardial infarction, bypass in 2005, low risk stress test in 2012 observation for chest discomfort possible unstable angina.  1. Chest pain/possible unstable angina-given her prior coronary history, and symptoms that remind her of her prior heart attack, I will observe her overnight, check cardiac markers, if positive will place on heparin. Currently she appears quite comfortable. CT scan reassuring. No evidence of aortic issues/dissection. 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 We will continue to observe.  2. Coronary artery disease-status post bypass-by stress test approximately one year ago, low risk with no ischemia. Reassuring. If cardiac markers are reassuring and during this admission, it is certainly reasonable to perform outpatient stress test.  3. COPD-quit smoking in 2012.  4. Hypertension-continue to monitor  5. Hyperlipidemia-continue to treat   Donato Schultz, MD  02/05/2012  4:01 PM

## 2012-02-06 LAB — LIPID PANEL
Cholesterol: 147 mg/dL (ref 0–200)
HDL: 32 mg/dL — ABNORMAL LOW (ref >39)
Triglycerides: 207 mg/dL — ABNORMAL HIGH (ref <150)

## 2012-02-06 LAB — BASIC METABOLIC PANEL
BUN: 19 mg/dL (ref 6–23)
Chloride: 101 mEq/L (ref 96–112)
GFR calc Af Amer: 42 mL/min — ABNORMAL LOW (ref >90)
GFR calc non Af Amer: 36 mL/min — ABNORMAL LOW (ref >90)
Potassium: 3.6 mEq/L (ref 3.5–5.1)
Sodium: 139 mEq/L (ref 135–145)

## 2012-02-06 LAB — CBC
HCT: 36.6 % (ref 36.0–46.0)
Platelets: 147 10*3/uL — ABNORMAL LOW (ref 150–400)
RDW: 14.2 % (ref 11.5–15.5)
WBC: 5 10*3/uL (ref 4.0–10.5)

## 2012-02-06 LAB — HEMOGLOBIN A1C
Hgb A1c MFr Bld: 5.8 % — ABNORMAL HIGH (ref <5.7)
Mean Plasma Glucose: 120 mg/dL — ABNORMAL HIGH (ref <117)

## 2012-02-06 LAB — TROPONIN I
Troponin I: <0.3 ng/mL (ref <0.30)
Troponin I: <0.3 ng/mL (ref <0.30)

## 2012-02-06 MED ORDER — POTASSIUM CHLORIDE CRYS ER 20 MEQ PO TBCR
40.0000 meq | EXTENDED_RELEASE_TABLET | Freq: Once | ORAL | Status: AC
  Administered 2012-02-06: 40 meq via ORAL
  Filled 2012-02-06: qty 2

## 2012-02-06 MED ORDER — SIMETHICONE 80 MG PO CHEW
80.0000 mg | CHEWABLE_TABLET | Freq: Four times a day (QID) | ORAL | Status: DC | PRN
  Administered 2012-02-06: 80 mg via ORAL
  Filled 2012-02-06 (×2): qty 1

## 2012-02-06 MED ORDER — SIMETHICONE 80 MG PO CHEW: 80.0000 mg | CHEWABLE_TABLET | Freq: Four times a day (QID) | ORAL | Status: DC | PRN

## 2012-02-06 NOTE — Care Management Note (Signed)
    Page 1 of 1   02/06/2012     1:57:09 PM   CARE MANAGEMENT NOTE 02/06/2012  Patient:  [name], [name]   Account Number:  [id number]  Date Initiated:  02/06/2012  Documentation initiated by:  Bay Wayson  Subjective/Objective Assessment:   PT ADM ON 02/05/12 WITH CHEST PAIN.  PTA, PT INDEPENDENT OF ADLS.     Action/Plan:   WILL FOLLOW FOR HOME NEEDS AS PT PROGRESSES.  NO DC NEEDS ANTICIPATED.   Anticipated DC Date:  02/06/2012   Anticipated DC Plan:  HOME/SELF CARE      DC Planning Services  CM consult      Choice offered to / List presented to:             Status of service:  Completed, signed off Medicare Important Message given?   (If response is "NO", the following Medicare IM given date fields will be blank) Date Medicare IM given:   Date Additional Medicare IM given:    Discharge Disposition:  HOME/SELF CARE  Per UR Regulation:  Reviewed for med. necessity/level of care/duration of stay  If discussed at Long Length of Stay Meetings, dates discussed:    Comments:

## 2012-02-06 NOTE — Progress Notes (Signed)
02/06/2012 10:33 AM Nursing note Discharge avs form, medications given today and those due this evening given and explained to patient and daughter. Location of called in RX given to pt. Follow up appointments and when to call MD reviewed. Pt. Verbalized understanding. Pt. Refused 1000 medications prior to discharge stating she wants to take them once she arrives home. D/c iv line. D/c tele. D/c home per orders.  Khara Renaud, Blanchard Kelch

## 2012-02-06 NOTE — Progress Notes (Signed)
The admission nurse informed the floor nurse that the patient has requested a DNR order. Harmon Pier

## 2012-02-06 NOTE — Discharge Summary (Addendum)
Patient ID: [name] MRN: [id number] DOB/AGE: [birth year] 78 y.o.  Admit date: 02/05/2012 Discharge date: 02/06/2012  Primary Discharge Diagnosis: Atypical chest pain Secondary Discharge Diagnosis: Prior bypass, myocardial infarction, COPD, hypertension, hyperlipidemia, bradycardia-asymptomatic  Significant Diagnostic Studies: EKG is unremarkable    Hospital Course: 78 year old female with prior bypass surgery prior myocardial infarction who described yesterday mostly right-sided radiating to her back chest discomfort with some mild associated shortness of breath. She was worried that this was similar to her prior heart attack. Cardiac markers have all been normal. CT of her chest did not show any evidence of pulmonary embolism or aortic dissection. Her heart rate was noted to be at times in the 40s, sinus bradycardia especially during the night. She was asymptomatic. She's not on any AV nodal blocking agents currently. This could be in part vagally mediated. We will continue to monitor. If she has any syncopal episodes, further monitoring may be necessary.  On morning of discharge she did state that after eating her breakfast she had some abdominal bloating and requested medication to help with this. Simethicone was given. Her exam was unremarkable, no significant tenderness in her abdomen. She has had a bowel movement without any melena. No nausea or vomiting. She is ambulating well. In her own words, she is eager to go home. She states that Dr. Laural Benes is her GI doctor. She has had colonoscopy.  Last year she had a nuclear stress test which was low risk.  I did just give her 40 mEq of potassium and perhaps this is the reason why her stomach is irritated. Her creatinine is 1.36, CO2 31. LDL cholesterol 74. Triglycerides 207. Liver functions were normal on admission. Potassium was slightly low on admission and was repleted on morning of discharge. She is on HCTZ. Depending on her blood  pressures, and electrolytes, this may need to be discontinued if she continues to demonstrate hypokalemia.   Discharge Exam: Blood pressure 124/66, pulse 44, temperature 98.2 F (36.8 C), temperature source Oral, resp. rate 18, height 5\' 2"  (1.575 m), weight 66.4 kg (146 lb 6.2 oz), SpO2 92.00%.    General: Alert and oriented x3 no acute distress Lungs: Clear to auscultation bilaterally Cardiovascular: Regular rate and rhythm no murmurs rubs or gallops, normal chest scar Abdomen: Nontender, soft, positive bowel sounds Extremities: No edema Neuro: Nonfocal, alert  Labs:   Lab Results  Component Value Date   WBC 5.0 02/06/2012   HGB 12.4 02/06/2012   HCT 36.6 02/06/2012   MCV 100.3* 02/06/2012   PLT 147* 02/06/2012    Lab 02/06/12 0635 02/05/12 1944  NA 139 --  K 3.6 --  CL 101 --  CO2 31 --  BUN 19 --  CREATININE 1.36* --  CALCIUM 8.8 --  PROT -- 7.2  BILITOT -- 0.4  ALKPHOS -- 56  ALT -- 11  AST -- 19  GLUCOSE 98 --   Lab Results  Component Value Date   CKTOTAL 138 03/05/2008   CKMB 1.2 03/05/2008   TROPONINI <0.30 02/06/2012    Lab Results  Component Value Date   CHOL 147 02/06/2012   CHOL  Value: 115        ATP III CLASSIFICATION:  <200     mg/dL   Desirable  409-811  mg/dL   Borderline High  >=914    mg/dL   High 78/29/5621   Lab Results  Component Value Date   HDL 32* 02/06/2012   HDL 14* 03/03/2008   Lab Results  Component Value Date   LDLCALC 74 02/06/2012   LDLCALC  Value: 82        Total Cholesterol/HDL:CHD Risk Coronary Heart Disease Risk Table                     Men   Women  1/2 Average Risk   3.4   3.3 03/03/2008   Lab Results  Component Value Date   TRIG 207* 02/06/2012   TRIG 96 03/03/2008   Lab Results  Component Value Date   CHOLHDL 4.6 02/06/2012   CHOLHDL 8.2 03/03/2008   No results found for this basename: LDLDIRECT       FOLLOW UP PLANS AND APPOINTMENTS    Medication List     As of 02/06/2012  8:22 AM    ASK your doctor about  these medications         aspirin EC 325 MG tablet   Take 325 mg by mouth daily.      cholecalciferol 1000 UNITS tablet   Commonly known as: VITAMIN D   Take 1,000 Units by mouth daily.      COQ-10 PO   Take 1 tablet by mouth daily.      docusate sodium 100 MG capsule   Commonly known as: COLACE   Take 100 mg by mouth 2 (two) times daily.      ezetimibe 10 MG tablet   Commonly known as: ZETIA   Take 10 mg by mouth daily.      FISH OIL PO   Take 1 capsule by mouth 3 (three) times daily.      hydrochlorothiazide 25 MG tablet   Commonly known as: HYDRODIURIL   Take 25 mg by mouth daily.      isosorbide mononitrate 60 MG 24 hr tablet   Commonly known as: IMDUR   Take 60 mg by mouth daily.      Magnesium 250 MG Tabs   Take 250 mg by mouth daily.      polyethylene glycol packet   Commonly known as: MIRALAX / GLYCOLAX   Take 17 g by mouth daily.      Potassium 99 MG Tabs   Take 99 mg by mouth daily.      temazepam 15 MG capsule   Commonly known as: RESTORIL   Take 15-30 mg by mouth at bedtime. 15 mg to start, may take additional 15 mg if needed      traMADol 50 MG tablet   Commonly known as: ULTRAM   Take 50-100 mg by mouth every 6 (six) hours as needed. For pain      VITAMIN E PO   Take 1 tablet by mouth daily.        I will set her up for a nuclear stress test, pharmacologic. If her symptoms worsen or become more worrisome she knows to seek medical attention. I encouraged her to obtain an early appointment with Dr. Donette Larry as well.  BRING ALL MEDICATIONS WITH YOU TO FOLLOW UP APPOINTMENTS  Time spent with patient to include physician time: 25 minutes  Signed: Donato Schultz 02/06/2012, 8:22 AM

## 2013-01-19 ENCOUNTER — Telehealth: Payer: Self-pay | Admitting: *Deleted

## 2013-01-19 NOTE — Telephone Encounter (Signed)
Received call from schedulers that pt was on phone calling with chest pain.  I spoke with pt who reports she is in [facility] and started having chest pain last night. She is requesting appt with Dr. Katrinka Blazing tomorrow.  She took 3 NTG and has had some relief.  I told pt she should call 911 to be transported to hospital in [facility] due to continuing chest pain. Pt agreeable with this.

## 2013-01-21 ENCOUNTER — Telehealth: Payer: Self-pay | Admitting: Interventional Cardiology

## 2013-01-21 NOTE — Telephone Encounter (Signed)
Agree 

## 2013-01-21 NOTE — Telephone Encounter (Signed)
New Problem:  Pt states Dr. Katrinka Blazing just called her and she is returning his call. I informed the pt that Dr Katrinka Blazing nor his CMA are in the clinic today. I also informed the pt of her upcoming appt next week. Pt states she just wants to return Dr. Michaelle Copas call.... Please advise

## 2013-01-21 NOTE — Telephone Encounter (Signed)
Routed to Dr. Katrinka Blazing and [facility], CMA

## 2013-01-21 NOTE — Telephone Encounter (Signed)
Notified patient of Dr. Michaelle Copas message. Patient states she is okay and that it seems like it was gastric related.  Patient states she will keep appointment.

## 2013-01-21 NOTE — Telephone Encounter (Signed)
Calling to check up on the patient after she called about having chest pain while in [facility]. If she is doing okay no specific actions are needed. Keep appointment.

## 2013-01-24 ENCOUNTER — Encounter: Payer: Self-pay | Admitting: Interventional Cardiology

## 2013-01-24 DIAGNOSIS — J42 Unspecified chronic bronchitis: Secondary | ICD-10-CM | POA: Insufficient documentation

## 2013-01-24 DIAGNOSIS — J189 Pneumonia, unspecified organism: Secondary | ICD-10-CM | POA: Insufficient documentation

## 2013-01-24 DIAGNOSIS — J449 Chronic obstructive pulmonary disease, unspecified: Secondary | ICD-10-CM | POA: Insufficient documentation

## 2013-01-26 ENCOUNTER — Encounter: Payer: Self-pay | Admitting: Interventional Cardiology

## 2013-01-26 ENCOUNTER — Ambulatory Visit (INDEPENDENT_AMBULATORY_CARE_PROVIDER_SITE_OTHER): Payer: Medicare Other | Admitting: Interventional Cardiology

## 2013-01-26 VITALS — BP 132/84 | HR 72 | Ht 62.0 in | Wt 138.0 lb

## 2013-01-26 DIAGNOSIS — J449 Chronic obstructive pulmonary disease, unspecified: Secondary | ICD-10-CM

## 2013-01-26 DIAGNOSIS — I10 Essential (primary) hypertension: Secondary | ICD-10-CM

## 2013-01-26 DIAGNOSIS — K219 Gastro-esophageal reflux disease without esophagitis: Secondary | ICD-10-CM

## 2013-01-26 DIAGNOSIS — R079 Chest pain, unspecified: Secondary | ICD-10-CM

## 2013-01-26 DIAGNOSIS — I2510 Atherosclerotic heart disease of native coronary artery without angina pectoris: Secondary | ICD-10-CM

## 2013-01-26 NOTE — Patient Instructions (Signed)
Your physician recommends that you continue on your current medications as directed. Please refer to the Current Medication list given to you today.  Your physician recommends that you schedule a follow-up appointment in: As needed  Follow up with your primary care physician

## 2013-01-26 NOTE — Progress Notes (Signed)
Patient ID: [name], female   DOB: 03-28-1932, 79 y.o.   MRN: [id number]    [address], [address] [place], [place]  [address] Phone: [phone] Fax:  [phone]  Date:  01/26/2013   ID:  [name], DOB 03-19-1932, MRN [id number]  PCP:  Georgann Housekeeper, MD   ASSESSMENT:  1. Chest pain starting in upper left subscapular region and radiating around the rib cage to the left mid chest and subclavicular area. This is been present now for greater than a week. It is unaltered by physical activity and position. No rashes present. This pattern of discomfort seems musculoskeletal perhaps from thoracic disc or other mechanical abnormality.  2. Coronary disease without symptoms suggestive of angina  3. Gastroesophageal reflux  4. Hypertension  5. Tobacco abuse  6. Weight loss  PLAN:  1. The patient should followup with her primary physician, Dr. Eula Listen concerning the chest discomfort and weight loss. In a smoker these findings could represent evidence of a mitotic process.  2. She will followup from a cardiac standpoint as needed.   SUBJECTIVE: [name] is a 79 y.o. female who is here today with complaints of persisting left chest pain in a radicular/dermatomal pattern. The discomfort started nearly 10 days ago when she was in [facility]. Changes in position, activity, and respiratory effort do not change the discomfort. His been unremitting and is a grade 6 of 10 in intensity. There is no evidence of skin rashes she is noted.  She has also had burping, belching, and heartburn. This is also been continuous and is nonexertional.  She has chronic dyspnea on exertion related to COPD.  The patient continues to smoke. Her appetite has been poor. She has lost weight.   Wt Readings from Last 3 Encounters:  01/26/13 138 lb (62.596 kg)  02/06/12 146 lb 6.2 oz (66.4 kg)     Past Medical History  Diagnosis Date  . COPD (chronic obstructive pulmonary disease)     . Gout   . Coronary atherosclerosis of native coronary artery 02/05/2012  . Hypertension   . High blood cholesterol   . Heart murmur     "all my life" (02/05/2012)  . Anginal pain   . Myocardial infarction 1990's; 2000's    "2 total, I think" (02/05/2012)  . Pneumonia     "several times" (02/05/2012)  . Chronic bronchitis   . Shortness of breath     "sometimes when I'm laying down; always when I do too much" (02/05/2012)  . History of blood transfusion   . External bleeding hemorrhoids     "act up at times" (02/05/2012)  . Chronic back pain     "all over" (02/05/2012)  . Depression     Current Outpatient Prescriptions  Medication Sig Dispense Refill  . aspirin EC 325 MG tablet Take 325 mg by mouth daily.      . cholecalciferol (VITAMIN D) 1000 UNITS tablet Take 1,000 Units by mouth daily.      . Coenzyme Q10 (COQ-10 PO) Take 1 tablet by mouth daily.      [name] docusate sodium (COLACE) 100 MG capsule Take 100 mg by mouth 2 (two) times daily.      [name] ezetimibe (ZETIA) 10 MG tablet Take 10 mg by mouth daily.      . hydrochlorothiazide (HYDRODIURIL) 25 MG tablet Take 25 mg by mouth daily.      . Magnesium 250 MG TABS Take 250 mg by mouth daily.      [name]  NITROSTAT 0.4 MG SL tablet Place under the tongue as needed.       . Omega-3 Fatty Acids (FISH OIL PO) Take 1 capsule by mouth 3 (three) times daily.      . polyethylene glycol (MIRALAX / GLYCOLAX) packet Take 17 g by mouth daily.      . Potassium 99 MG TABS Take 99 mg by mouth daily.      . simethicone (MYLICON) 80 MG chewable tablet Chew 1 tablet (80 mg total) by mouth 4 (four) times daily as needed for flatulence (bloating).  30 tablet  2  . temazepam (RESTORIL) 15 MG capsule Take 15-30 mg by mouth at bedtime. 15 mg to start, may take additional 15 mg if needed      . traMADol (ULTRAM) 50 MG tablet Take 50-100 mg by mouth every 6 (six) hours as needed. For pain       No current facility-administered medications for this visit.     Allergies:    Allergies  Allergen Reactions  . Lexapro [Escitalopram Oxalate] Other (See Comments)    Fatigue   . Soma [Carisoprodol] Other (See Comments)    Fatigue   . Wellbutrin [Bupropion] Other (See Comments)    "couldn't function and take it"  . Albuterol Sulfate Other (See Comments)    Albuterol HFA inhaler caused nervousness   . Codeine Hives  . Plavix [Clopidogrel Bisulfate] Other (See Comments)    unknown    Social History:  The patient  reports that she quit smoking about 21 months ago. Her smoking use included Cigarettes. She has a 50 pack-year smoking history. She has never used smokeless tobacco. She reports that she drinks alcohol. She reports that she does not use illicit drugs.   ROS:  Please see the history of present illness.   Denies melena, dysphagia, edema, palpitations, syncope, and jaundice.   All other systems reviewed and negative.   OBJECTIVE: VS:  BP 132/84  Pulse 72  Ht 5\' 2"  (1.575 m)  Wt 138 lb (62.596 kg)  BMI 25.23 kg/m2 Well nourished, well developed, in no acute distress, pale. HEENT: normal Neck: JVD flat. Carotid bruit , absent  Cardiac:  normal S1, S2; RRR; no murmur Lungs:  clear to auscultation bilaterally, no wheezing, rhonchi or rales Abd: soft, nontender, no hepatomegaly Ext: Edema none. Pulses 2+ Skin: warm and dry Neuro:  CNs 2-12 intact, no focal abnormalities noted  EKG:  Normal sinus rhythm with left posterior hemiblock.       Signed, Darci Needle III, MD 01/26/2013 12:16 PM

## 2013-07-19 ENCOUNTER — Emergency Department (HOSPITAL_COMMUNITY): Payer: Medicare Other

## 2013-07-19 ENCOUNTER — Emergency Department (HOSPITAL_COMMUNITY)
Admission: EM | Admit: 2013-07-19 | Discharge: 2013-07-19 | Disposition: A | Discharge status: Home / Self Care | Payer: Medicare Other | Attending: Emergency Medicine | Admitting: Emergency Medicine

## 2013-07-19 ENCOUNTER — Encounter (HOSPITAL_COMMUNITY): Payer: Self-pay | Admitting: Emergency Medicine

## 2013-07-19 DIAGNOSIS — Z9861 Coronary angioplasty status: Secondary | ICD-10-CM | POA: Insufficient documentation

## 2013-07-19 DIAGNOSIS — E7800 Pure hypercholesterolemia, unspecified: Secondary | ICD-10-CM | POA: Insufficient documentation

## 2013-07-19 DIAGNOSIS — I209 Angina pectoris, unspecified: Secondary | ICD-10-CM | POA: Insufficient documentation

## 2013-07-19 DIAGNOSIS — J441 Chronic obstructive pulmonary disease with (acute) exacerbation: Secondary | ICD-10-CM | POA: Insufficient documentation

## 2013-07-19 DIAGNOSIS — Z7982 Long term (current) use of aspirin: Secondary | ICD-10-CM | POA: Insufficient documentation

## 2013-07-19 DIAGNOSIS — K5730 Diverticulosis of large intestine without perforation or abscess without bleeding: Secondary | ICD-10-CM | POA: Insufficient documentation

## 2013-07-19 DIAGNOSIS — F3289 Other specified depressive episodes: Secondary | ICD-10-CM | POA: Insufficient documentation

## 2013-07-19 DIAGNOSIS — I10 Essential (primary) hypertension: Secondary | ICD-10-CM | POA: Insufficient documentation

## 2013-07-19 DIAGNOSIS — G8929 Other chronic pain: Secondary | ICD-10-CM | POA: Insufficient documentation

## 2013-07-19 DIAGNOSIS — R61 Generalized hyperhidrosis: Secondary | ICD-10-CM | POA: Insufficient documentation

## 2013-07-19 DIAGNOSIS — R0602 Shortness of breath: Secondary | ICD-10-CM

## 2013-07-19 DIAGNOSIS — I2510 Atherosclerotic heart disease of native coronary artery without angina pectoris: Secondary | ICD-10-CM | POA: Insufficient documentation

## 2013-07-19 DIAGNOSIS — Z8701 Personal history of pneumonia (recurrent): Secondary | ICD-10-CM | POA: Insufficient documentation

## 2013-07-19 DIAGNOSIS — I498 Other specified cardiac arrhythmias: Secondary | ICD-10-CM | POA: Insufficient documentation

## 2013-07-19 DIAGNOSIS — R011 Cardiac murmur, unspecified: Secondary | ICD-10-CM | POA: Insufficient documentation

## 2013-07-19 DIAGNOSIS — M109 Gout, unspecified: Secondary | ICD-10-CM | POA: Insufficient documentation

## 2013-07-19 DIAGNOSIS — F329 Major depressive disorder, single episode, unspecified: Secondary | ICD-10-CM | POA: Insufficient documentation

## 2013-07-19 DIAGNOSIS — Z87891 Personal history of nicotine dependence: Secondary | ICD-10-CM | POA: Insufficient documentation

## 2013-07-19 DIAGNOSIS — R001 Bradycardia, unspecified: Secondary | ICD-10-CM

## 2013-07-19 DIAGNOSIS — I252 Old myocardial infarction: Secondary | ICD-10-CM | POA: Insufficient documentation

## 2013-07-19 DIAGNOSIS — Z951 Presence of aortocoronary bypass graft: Secondary | ICD-10-CM | POA: Insufficient documentation

## 2013-07-19 DIAGNOSIS — Z79899 Other long term (current) drug therapy: Secondary | ICD-10-CM | POA: Insufficient documentation

## 2013-07-19 DIAGNOSIS — M546 Pain in thoracic spine: Secondary | ICD-10-CM | POA: Insufficient documentation

## 2013-07-19 LAB — BASIC METABOLIC PANEL
BUN: 30 mg/dL — AB (ref 6–23)
CO2: 28 mEq/L (ref 19–32)
Calcium: 9.4 mg/dL (ref 8.4–10.5)
Chloride: 99 mEq/L (ref 96–112)
Creatinine, Ser: 1.28 mg/dL — ABNORMAL HIGH (ref 0.50–1.10)
GFR, EST AFRICAN AMERICAN: 45 mL/min — AB (ref >90)
GFR, EST NON AFRICAN AMERICAN: 38 mL/min — AB (ref >90)
Glucose, Bld: 153 mg/dL — ABNORMAL HIGH (ref 70–99)
POTASSIUM: 4 meq/L (ref 3.7–5.3)
SODIUM: 141 meq/L (ref 137–147)

## 2013-07-19 LAB — I-STAT TROPONIN, ED
TROPONIN I, POC: 0.01 ng/mL (ref 0.00–0.08)
Troponin i, poc: 0.01 ng/mL (ref 0.00–0.08)

## 2013-07-19 LAB — CBC
HCT: 37.1 % (ref 36.0–46.0)
Hemoglobin: 12.8 g/dL (ref 12.0–15.0)
MCH: 35.4 pg — ABNORMAL HIGH (ref 26.0–34.0)
MCHC: 34.5 g/dL (ref 30.0–36.0)
MCV: 102.5 fL — ABNORMAL HIGH (ref 78.0–100.0)
Platelets: 174 10*3/uL (ref 150–400)
RBC: 3.62 MIL/uL — ABNORMAL LOW (ref 3.87–5.11)
RDW: 14.8 % (ref 11.5–15.5)
WBC: 5 10*3/uL (ref 4.0–10.5)

## 2013-07-19 LAB — PRO B NATRIURETIC PEPTIDE: Pro B Natriuretic peptide (BNP): 299.3 pg/mL (ref 0–450)

## 2013-07-19 MED ORDER — ASPIRIN 325 MG PO TABS
325.0000 mg | ORAL_TABLET | Freq: Once | ORAL | Status: AC
  Administered 2013-07-19: 325 mg via ORAL
  Filled 2013-07-19: qty 1

## 2013-07-19 MED ORDER — ONDANSETRON HCL 4 MG/2ML IJ SOLN
4.0000 mg | Freq: Once | INTRAMUSCULAR | Status: AC
  Administered 2013-07-19: 4 mg via INTRAVENOUS
  Filled 2013-07-19: qty 2

## 2013-07-19 MED ORDER — IOHEXOL 350 MG/ML SOLN
80.0000 mL | Freq: Once | INTRAVENOUS | Status: AC | PRN
  Administered 2013-07-19: 80 mL via INTRAVENOUS

## 2013-07-19 NOTE — ED Notes (Signed)
Pt resting quietly at this time.  Waiting for CT scan.  Pt alert and oriented x's 3, skin warm and dry, color appropriate.

## 2013-07-19 NOTE — ED Provider Notes (Signed)
Medical screening examination/treatment/procedure(s) were conducted as a shared visit with non-physician practitioner(s) and myself.  I personally evaluated the patient during the encounter.   EKG Interpretation   Date/Time:  Tuesday July 19 2013 11:43:41 EDT Ventricular Rate:  60 PR Interval:  200 QRS Duration: 88 QT Interval:  420 QTC Calculation: 420 R Axis:   133 Text Interpretation:  Normal sinus rhythm with sinus arrhythmia Left  posterior fascicular block Possible Anterior infarct , age undetermined  Abnormal ECG No significant change since last tracing Confirmed by  Analeigha Nauman  MD, [place] [phone]) on 07/19/2013 12:04:23 PM      Results for orders placed during the hospital encounter of 07/19/13  CBC      Result Value Ref Range   WBC 5.0  4.0 - 10.5 K/uL   RBC 3.62 (*) 3.87 - 5.11 MIL/uL   Hemoglobin 12.8  12.0 - 15.0 g/dL   HCT 37.1  36.0 - 46.0 %   MCV 102.5 (*) 78.0 - 100.0 fL   MCH 35.4 (*) 26.0 - 34.0 pg   MCHC 34.5  30.0 - 36.0 g/dL   RDW 14.8  11.5 - 15.5 %   Platelets 174  150 - 400 K/uL  BASIC METABOLIC PANEL      Result Value Ref Range   Sodium 141  137 - 147 mEq/L   Potassium 4.0  3.7 - 5.3 mEq/L   Chloride 99  96 - 112 mEq/L   CO2 28  19 - 32 mEq/L   Glucose, Bld 153 (*) 70 - 99 mg/dL   BUN 30 (*) 6 - 23 mg/dL   Creatinine, Ser 1.28 (*) 0.50 - 1.10 mg/dL   Calcium 9.4  8.4 - 10.5 mg/dL   GFR calc non Af Amer 38 (*) >90 mL/min   GFR calc Af Amer 45 (*) >90 mL/min  PRO B NATRIURETIC PEPTIDE      Result Value Ref Range   Pro B Natriuretic peptide (BNP) 299.3  0 - 450 pg/mL  I-STAT TROPOININ, ED      Result Value Ref Range   Troponin i, poc 0.01  0.00 - 0.08 ng/mL   Comment 3            Dg Chest 2 View  07/19/2013   CLINICAL DATA:  Pain.  EXAM: CHEST  2 VIEW  COMPARISON:  01/03/2013  FINDINGS: Changes from CABG surgery are stable. Cardiac silhouette is mildly enlarged. Normal mediastinal and hilar contours.  Clear lungs.  No pleural effusion.  No  pneumothorax.  Bony thorax is intact.  IMPRESSION: No acute cardiopulmonary disease.   Electronically Signed   By: Lajean Manes M.D.   On: 07/19/2013 12:38   Ct Angio Abdomen W/cm &/or Wo Contrast  07/19/2013   CLINICAL DATA:  Posterior chest pain between the shoulder blades with associated nausea vomiting and lightheaded; history of reason productive cough treated with antibiotics  EXAM: CT ANGIOGRAPHY CHEST, ABDOMEN AND PELVIS  TECHNIQUE: Multidetector CT imaging through the chest, abdomen and pelvis was performed using the standard protocol during bolus administration of intravenous contrast. Multiplanar reconstructed images and MIPs were obtained and reviewed to evaluate the vascular anatomy.  CONTRAST:  53mL OMNIPAQUE IOHEXOL 350 MG/ML SOLN  COMPARISON:  DG CHEST 2 VIEW dated 07/19/2013  FINDINGS: CTA CHEST FINDINGS  The caliber of the aortic root is stable measuring 4.2 cm. The aortic arch and descending thoracic aorta exhibit normal caliber. There is no false lumen. The cardiac chambers are enlarged. There is coronary  artery calcification. Contrast within the pulmonary arterial tree is normal. There are no findings to suggest an acute pulmonary embolism. No pathologic sized mediastinal or hilar or axillary lymph nodes are demonstrated. The caliber of the thoracic esophagus is normal. The thyroid gland and retrosternal soft tissues appear normal. There is no pleural nor pericardial effusion.  At lung window settings there is mild compressive atelectasis at both lung bases. There is minimal subsegmental atelectasis in the lingula. There is no alveolar pneumonia. There is no significant bronchiectasis. No pulmonary parenchymal masses are demonstrated. The thoracic vertebral bodies are preserved in height. The patient has undergone previous median sternotomy and CABG and stent placement. The ribs exhibit no acute abnormalities. No paravertebral hematoma or inflammatory mass is demonstrated.  Review of the MIP  images confirms the above findings.  CTA ABDOMEN AND PELVIS FINDINGS  The abdominal aorta exhibits normal caliber. There is no false lumen. There is no periaortic hematoma or masses. The mesenteric vessels appear to fill well. The iliac arteries exhibit no evidence of an aneurysm.  The gallbladder is surgically absent. The liver exhibits mildly decreased density suggesting fatty infiltration. The pancreas, spleen, adrenal glands, and kidneys exhibit no acute abnormalities. There is mild cortical atrophy of both kidneys. There is likely a cyst measuring 12 mm in diameter in the lower pole of the left kidney exhibiting Hounsfield measurement of +20. There is a small hiatal hernia. The stomach, duodenum, and small and large bowel exhibit no evidence of ileus nor obstruction. There is sigmoid diverticulosis without objective evidence of acute diverticulitis. The urinary bladder is partially distended and grossly normal. The uterus is surgically absent. There are no adnexal masses. There are small fat containing inguinal hernias. There is no umbilical hernia.  The lumbar vertebral bodies are preserved in height. There is mild disc space narrowing at L4-5 and at L5-S1 and there is no pars defect. The bony pelvis exhibits no acute abnormalities.  Review of the MIP images confirms the above findings.  IMPRESSION: 1. There is no evidence of thoracic aortic dissection or leaking aneurysm. There is no evidence of acute pulmonary embolism. 2. The cardiac chambers are enlarged. There is no more than minimal pulmonary interstitial edema. 3. There is no evidence of pneumonia. 4. There is no evidence of an abdominal aortic aneurysm nor dissection. The iliac arteries appear normal. 5. There is no acute hepatobiliary nor acute urinary tract abnormality. Bilateral renal cortical atrophy is present. 6. There is no acute bowel abnormality. There is sigmoid diverticulosis. No intra-abdominal or pelvic masses or free fluid are  demonstrated.   Electronically Signed   By: David  Martinique   On: 07/19/2013 16:21   Ct Angio Chest Aortic Dissect W &/or W/o  07/19/2013   CLINICAL DATA:  Posterior chest pain between the shoulder blades with associated nausea vomiting and lightheaded; history of reason productive cough treated with antibiotics  EXAM: CT ANGIOGRAPHY CHEST, ABDOMEN AND PELVIS  TECHNIQUE: Multidetector CT imaging through the chest, abdomen and pelvis was performed using the standard protocol during bolus administration of intravenous contrast. Multiplanar reconstructed images and MIPs were obtained and reviewed to evaluate the vascular anatomy.  CONTRAST:  8mL OMNIPAQUE IOHEXOL 350 MG/ML SOLN  COMPARISON:  DG CHEST 2 VIEW dated 07/19/2013  FINDINGS: CTA CHEST FINDINGS  The caliber of the aortic root is stable measuring 4.2 cm. The aortic arch and descending thoracic aorta exhibit normal caliber. There is no false lumen. The cardiac chambers are enlarged. There is coronary artery calcification.  Contrast within the pulmonary arterial tree is normal. There are no findings to suggest an acute pulmonary embolism. No pathologic sized mediastinal or hilar or axillary lymph nodes are demonstrated. The caliber of the thoracic esophagus is normal. The thyroid gland and retrosternal soft tissues appear normal. There is no pleural nor pericardial effusion.  At lung window settings there is mild compressive atelectasis at both lung bases. There is minimal subsegmental atelectasis in the lingula. There is no alveolar pneumonia. There is no significant bronchiectasis. No pulmonary parenchymal masses are demonstrated. The thoracic vertebral bodies are preserved in height. The patient has undergone previous median sternotomy and CABG and stent placement. The ribs exhibit no acute abnormalities. No paravertebral hematoma or inflammatory mass is demonstrated.  Review of the MIP images confirms the above findings.  CTA ABDOMEN AND PELVIS FINDINGS  The  abdominal aorta exhibits normal caliber. There is no false lumen. There is no periaortic hematoma or masses. The mesenteric vessels appear to fill well. The iliac arteries exhibit no evidence of an aneurysm.  The gallbladder is surgically absent. The liver exhibits mildly decreased density suggesting fatty infiltration. The pancreas, spleen, adrenal glands, and kidneys exhibit no acute abnormalities. There is mild cortical atrophy of both kidneys. There is likely a cyst measuring 12 mm in diameter in the lower pole of the left kidney exhibiting Hounsfield measurement of +20. There is a small hiatal hernia. The stomach, duodenum, and small and large bowel exhibit no evidence of ileus nor obstruction. There is sigmoid diverticulosis without objective evidence of acute diverticulitis. The urinary bladder is partially distended and grossly normal. The uterus is surgically absent. There are no adnexal masses. There are small fat containing inguinal hernias. There is no umbilical hernia.  The lumbar vertebral bodies are preserved in height. There is mild disc space narrowing at L4-5 and at L5-S1 and there is no pars defect. The bony pelvis exhibits no acute abnormalities.  Review of the MIP images confirms the above findings.  IMPRESSION: 1. There is no evidence of thoracic aortic dissection or leaking aneurysm. There is no evidence of acute pulmonary embolism. 2. The cardiac chambers are enlarged. There is no more than minimal pulmonary interstitial edema. 3. There is no evidence of pneumonia. 4. There is no evidence of an abdominal aortic aneurysm nor dissection. The iliac arteries appear normal. 5. There is no acute hepatobiliary nor acute urinary tract abnormality. Bilateral renal cortical atrophy is present. 6. There is no acute bowel abnormality. There is sigmoid diverticulosis. No intra-abdominal or pelvic masses or free fluid are demonstrated.   Electronically Signed   By: David  Martinique   On: 07/19/2013 16:21     Patient with persistent upper thoracic back chest pain since yesterday afternoon extensive workup here are negative. CT angios no evidence of PE or dissection or significant thoracic or abdominal aortic aneurysm. Patient's initial troponin was negative. EKG with possible anterior infarct but nothing acute. We'll repeat the second troponin now if negative even though she still having discomfort we'll go ahead and discharge home with followup with her cardiology group.  Mervin Kung, MD 07/19/13 [phone]

## 2013-07-19 NOTE — ED Provider Notes (Signed)
CSN: [id number]     Arrival date & time 07/19/13  1139 History   First MD Initiated Contact with Patient 07/19/13 1227     Chief Complaint  Patient presents with  . Back Pain  . Emesis     (Consider location/radiation/quality/duration/timing/severity/associated sxs/prior Treatment) Patient is a 80 y.o. female presenting with back pain and vomiting.  Back Pain Emesis Associated symptoms: no diarrhea    80 yo female with hx of CAD, COPD, HTN, and MI presents with back pain that started last night prior to going to bed and has been constant until today. Pain is sharp and rated at a 6/10. Patient admits to some SOB associated with it, N/V, and diaphoresis. Pain relieved by nitro this morning. Patient recently been on multiple antibiotics for PNA over the past month. Patient denies any chest pain but admits to epigastric pain.   Past Medical History  Diagnosis Date  . COPD (chronic obstructive pulmonary disease)   . Gout   . Coronary atherosclerosis of native coronary artery 02/05/2012  . Hypertension   . High blood cholesterol   . Heart murmur     "all my life" (02/05/2012)  . Anginal pain   . Myocardial infarction 1990's; 2000's    "2 total, I think" (02/05/2012)  . Pneumonia     "several times" (02/05/2012)  . Chronic bronchitis   . Shortness of breath     "sometimes when I'm laying down; always when I do too much" (02/05/2012)  . History of blood transfusion   . External bleeding hemorrhoids     "act up at times" (02/05/2012)  . Chronic back pain     "all over" (02/05/2012)  . Depression    Past Surgical History  Procedure Laterality Date  . Appendectomy  ? 1970's  . Ectopic pregnancy surgery  1970's  . Abdominal hysterectomy  ? 1970's    "partial 1st time; complete 2nd" (02/05/2012)  . Cholecystectomy  ~ 2010  . Cataract extraction w/ intraocular lens  implant, bilateral  [phone]  . Coronary angioplasty with stent placement  2005  . Coronary artery bypass  graft  2005    CABG X2   History reviewed. No pertinent family history. History  Substance Use Topics  . Smoking status: Former Smoker -- 1.00 packs/day for 50 years    Types: Cigarettes    Quit date: 04/02/2011  . Smokeless tobacco: Never Used  . Alcohol Use: Yes     Comment: 02/05/2012 "have a drink of wine ~ q 6 months"   OB History   Grav Para Term Preterm Abortions TAB SAB Ect Mult Living                 Review of Systems  Cardiovascular: Negative for palpitations and leg swelling.  Gastrointestinal: Positive for nausea and vomiting. Negative for diarrhea and constipation.  Musculoskeletal: Positive for back pain.  All other systems reviewed and are negative.     Allergies  Lexapro; Soma; Wellbutrin; Albuterol sulfate; Codeine; and Plavix  Home Medications   Prior to Admission medications   Medication Sig Start Date End Date Taking? Authorizing Provider  aspirin EC 325 MG tablet Take 325 mg by mouth daily.   Yes Historical Provider, MD  escitalopram (LEXAPRO) 5 MG tablet Take 5 mg by mouth daily. 07/13/13  Yes Historical Provider, MD  ezetimibe (ZETIA) 10 MG tablet Take 10 mg by mouth every evening.    Yes Historical Provider, MD  hydrochlorothiazide (HYDRODIURIL) 25 MG tablet Take 25  mg by mouth daily.   Yes Historical Provider, MD  Magnesium 250 MG TABS Take 250 mg by mouth daily.   Yes Historical Provider, MD  naproxen sodium (ANAPROX) 220 MG tablet Take 440-660 mg by mouth daily as needed (pain).   Yes Historical Provider, MD  NITROSTAT 0.4 MG SL tablet Place 0.4 mg under the tongue every 5 (five) minutes as needed for chest pain.  01/06/13  Yes Historical Provider, MD  Omega-3 Fatty Acids (FISH OIL PO) Take 1 capsule by mouth 3 (three) times daily.   Yes Historical Provider, MD  polyethylene glycol (MIRALAX / GLYCOLAX) packet Take 17 g by mouth daily as needed for mild constipation.    Yes Historical Provider, MD  Potassium 99 MG TABS Take 99 mg by mouth daily.   Yes  Historical Provider, MD  temazepam (RESTORIL) 15 MG capsule Take 15-30 mg by mouth at bedtime. 15 mg to start, may take additional 15 mg if needed   Yes Historical Provider, MD  traMADol (ULTRAM) 50 MG tablet Take 50-100 mg by mouth every 6 (six) hours as needed. For pain   Yes Historical Provider, MD   BP 137/60  Pulse 49  Temp(Src) 97.6 F (36.4 C) (Oral)  Resp 10  Ht 5\' 2"  (1.575 m)  SpO2 95% Physical Exam  Nursing note and vitals reviewed. Constitutional: She is oriented to person, place, and time. She appears well-developed and well-nourished. No distress.  HENT:  Head: Normocephalic and atraumatic.  Nose: Nose normal.  Mouth/Throat: Uvula is midline, oropharynx is clear and moist and mucous membranes are normal. No oropharyngeal exudate.  Eyes: Conjunctivae and EOM are normal. No scleral icterus.  Neck: Normal range of motion. Neck supple. No JVD present. No tracheal deviation present.  Cardiovascular: Regular rhythm.  Bradycardia present.  Exam reveals no gallop and no friction rub.   No murmur heard. Pulmonary/Chest: Effort normal. No respiratory distress. She has no wheezes. She has no rhonchi. She has no rales.  Abdominal: Soft. She exhibits no distension.  Musculoskeletal: Normal range of motion. She exhibits no edema.  Neurological: She is alert and oriented to person, place, and time.  Skin: Skin is warm and dry. No rash noted. She is not diaphoretic.  Psychiatric: She has a normal mood and affect. Her behavior is normal.    ED Course  Procedures (including critical care time) Labs Review Labs Reviewed  CBC - Abnormal; Notable for the following:    RBC 3.62 (*)    MCV 102.5 (*)    MCH 35.4 (*)    All other components within normal limits  BASIC METABOLIC PANEL - Abnormal; Notable for the following:    Glucose, Bld 153 (*)    BUN 30 (*)    Creatinine, Ser 1.28 (*)    GFR calc non Af Amer 38 (*)    GFR calc Af Amer 45 (*)    All other components within normal  limits  PRO B NATRIURETIC PEPTIDE  I-STAT TROPOININ, ED  Randolm Idol, ED    Imaging Review Dg Chest 2 View  07/19/2013   CLINICAL DATA:  Pain.  EXAM: CHEST  2 VIEW  COMPARISON:  01/03/2013  FINDINGS: Changes from CABG surgery are stable. Cardiac silhouette is mildly enlarged. Normal mediastinal and hilar contours.  Clear lungs.  No pleural effusion.  No pneumothorax.  Bony thorax is intact.  IMPRESSION: No acute cardiopulmonary disease.   Electronically Signed   By: Lajean Manes M.D.   On: 07/19/2013 12:38  Ct Angio Abdomen W/cm &/or Wo Contrast  07/19/2013   CLINICAL DATA:  Posterior chest pain between the shoulder blades with associated nausea vomiting and lightheaded; history of reason productive cough treated with antibiotics  EXAM: CT ANGIOGRAPHY CHEST, ABDOMEN AND PELVIS  TECHNIQUE: Multidetector CT imaging through the chest, abdomen and pelvis was performed using the standard protocol during bolus administration of intravenous contrast. Multiplanar reconstructed images and MIPs were obtained and reviewed to evaluate the vascular anatomy.  CONTRAST:  28mL OMNIPAQUE IOHEXOL 350 MG/ML SOLN  COMPARISON:  DG CHEST 2 VIEW dated 07/19/2013  FINDINGS: CTA CHEST FINDINGS  The caliber of the aortic root is stable measuring 4.2 cm. The aortic arch and descending thoracic aorta exhibit normal caliber. There is no false lumen. The cardiac chambers are enlarged. There is coronary artery calcification. Contrast within the pulmonary arterial tree is normal. There are no findings to suggest an acute pulmonary embolism. No pathologic sized mediastinal or hilar or axillary lymph nodes are demonstrated. The caliber of the thoracic esophagus is normal. The thyroid gland and retrosternal soft tissues appear normal. There is no pleural nor pericardial effusion.  At lung window settings there is mild compressive atelectasis at both lung bases. There is minimal subsegmental atelectasis in the lingula. There is no  alveolar pneumonia. There is no significant bronchiectasis. No pulmonary parenchymal masses are demonstrated. The thoracic vertebral bodies are preserved in height. The patient has undergone previous median sternotomy and CABG and stent placement. The ribs exhibit no acute abnormalities. No paravertebral hematoma or inflammatory mass is demonstrated.  Review of the MIP images confirms the above findings.  CTA ABDOMEN AND PELVIS FINDINGS  The abdominal aorta exhibits normal caliber. There is no false lumen. There is no periaortic hematoma or masses. The mesenteric vessels appear to fill well. The iliac arteries exhibit no evidence of an aneurysm.  The gallbladder is surgically absent. The liver exhibits mildly decreased density suggesting fatty infiltration. The pancreas, spleen, adrenal glands, and kidneys exhibit no acute abnormalities. There is mild cortical atrophy of both kidneys. There is likely a cyst measuring 12 mm in diameter in the lower pole of the left kidney exhibiting Hounsfield measurement of +20. There is a small hiatal hernia. The stomach, duodenum, and small and large bowel exhibit no evidence of ileus nor obstruction. There is sigmoid diverticulosis without objective evidence of acute diverticulitis. The urinary bladder is partially distended and grossly normal. The uterus is surgically absent. There are no adnexal masses. There are small fat containing inguinal hernias. There is no umbilical hernia.  The lumbar vertebral bodies are preserved in height. There is mild disc space narrowing at L4-5 and at L5-S1 and there is no pars defect. The bony pelvis exhibits no acute abnormalities.  Review of the MIP images confirms the above findings.  IMPRESSION: 1. There is no evidence of thoracic aortic dissection or leaking aneurysm. There is no evidence of acute pulmonary embolism. 2. The cardiac chambers are enlarged. There is no more than minimal pulmonary interstitial edema. 3. There is no evidence of  pneumonia. 4. There is no evidence of an abdominal aortic aneurysm nor dissection. The iliac arteries appear normal. 5. There is no acute hepatobiliary nor acute urinary tract abnormality. Bilateral renal cortical atrophy is present. 6. There is no acute bowel abnormality. There is sigmoid diverticulosis. No intra-abdominal or pelvic masses or free fluid are demonstrated.   Electronically Signed   By: David  Martinique   On: 07/19/2013 16:21   Ct Angio Chest  Aortic Dissect W &/or W/o  07/19/2013   CLINICAL DATA:  Posterior chest pain between the shoulder blades with associated nausea vomiting and lightheaded; history of reason productive cough treated with antibiotics  EXAM: CT ANGIOGRAPHY CHEST, ABDOMEN AND PELVIS  TECHNIQUE: Multidetector CT imaging through the chest, abdomen and pelvis was performed using the standard protocol during bolus administration of intravenous contrast. Multiplanar reconstructed images and MIPs were obtained and reviewed to evaluate the vascular anatomy.  CONTRAST:  62mL OMNIPAQUE IOHEXOL 350 MG/ML SOLN  COMPARISON:  DG CHEST 2 VIEW dated 07/19/2013  FINDINGS: CTA CHEST FINDINGS  The caliber of the aortic root is stable measuring 4.2 cm. The aortic arch and descending thoracic aorta exhibit normal caliber. There is no false lumen. The cardiac chambers are enlarged. There is coronary artery calcification. Contrast within the pulmonary arterial tree is normal. There are no findings to suggest an acute pulmonary embolism. No pathologic sized mediastinal or hilar or axillary lymph nodes are demonstrated. The caliber of the thoracic esophagus is normal. The thyroid gland and retrosternal soft tissues appear normal. There is no pleural nor pericardial effusion.  At lung window settings there is mild compressive atelectasis at both lung bases. There is minimal subsegmental atelectasis in the lingula. There is no alveolar pneumonia. There is no significant bronchiectasis. No pulmonary parenchymal  masses are demonstrated. The thoracic vertebral bodies are preserved in height. The patient has undergone previous median sternotomy and CABG and stent placement. The ribs exhibit no acute abnormalities. No paravertebral hematoma or inflammatory mass is demonstrated.  Review of the MIP images confirms the above findings.  CTA ABDOMEN AND PELVIS FINDINGS  The abdominal aorta exhibits normal caliber. There is no false lumen. There is no periaortic hematoma or masses. The mesenteric vessels appear to fill well. The iliac arteries exhibit no evidence of an aneurysm.  The gallbladder is surgically absent. The liver exhibits mildly decreased density suggesting fatty infiltration. The pancreas, spleen, adrenal glands, and kidneys exhibit no acute abnormalities. There is mild cortical atrophy of both kidneys. There is likely a cyst measuring 12 mm in diameter in the lower pole of the left kidney exhibiting Hounsfield measurement of +20. There is a small hiatal hernia. The stomach, duodenum, and small and large bowel exhibit no evidence of ileus nor obstruction. There is sigmoid diverticulosis without objective evidence of acute diverticulitis. The urinary bladder is partially distended and grossly normal. The uterus is surgically absent. There are no adnexal masses. There are small fat containing inguinal hernias. There is no umbilical hernia.  The lumbar vertebral bodies are preserved in height. There is mild disc space narrowing at L4-5 and at L5-S1 and there is no pars defect. The bony pelvis exhibits no acute abnormalities.  Review of the MIP images confirms the above findings.  IMPRESSION: 1. There is no evidence of thoracic aortic dissection or leaking aneurysm. There is no evidence of acute pulmonary embolism. 2. The cardiac chambers are enlarged. There is no more than minimal pulmonary interstitial edema. 3. There is no evidence of pneumonia. 4. There is no evidence of an abdominal aortic aneurysm nor dissection.  The iliac arteries appear normal. 5. There is no acute hepatobiliary nor acute urinary tract abnormality. Bilateral renal cortical atrophy is present. 6. There is no acute bowel abnormality. There is sigmoid diverticulosis. No intra-abdominal or pelvic masses or free fluid are demonstrated.   Electronically Signed   By: David  Martinique   On: 07/19/2013 16:21     EKG Interpretation  Date/Time:  Tuesday July 19 2013 11:43:41 EDT Ventricular Rate:  60 PR Interval:  200 QRS Duration: 88 QT Interval:  420 QTC Calculation: 420 R Axis:   133 Text Interpretation:  Normal sinus rhythm with sinus arrhythmia Left  posterior fascicular block Possible Anterior infarct , age undetermined  Abnormal ECG No significant change since last tracing Confirmed by  ZACKOWSKI  MD, SCOTT [phone]) on 07/19/2013 12:04:23 PM      MDM   Final diagnoses:  Back pain, thoracic  Shortness of breath  Sigmoid diverticulosis  Bradycardia   Patient afebrile  Blood pressure deficit. Left - 124/70. Right - 144/54 CBC stable  Renal insufficiency appears stable BNP wnl  CXR negative Patient discussed with Dr. Rogene Houston. Will get CTA chest to assess for dissection.   CT angio shows no evidence of Dissection, PNA, AAA, or any acute intrabdominal abnormalities.  Delta troponin negative. Doubt ACS.  Patient appears stable for discharge. Plan to have patient follow up with her PCP in 2 days. Discussed results. Patient confirms her understanding. Plan to have patient return to ED for any worsening SOB or Chest pain.  Patient agrees with plan. Discharged in good condition.   Meds given in ED:  Medications  aspirin tablet 325 mg (325 mg Oral Given 07/19/13 1707)  ondansetron (ZOFRAN) injection 4 mg (4 mg Intravenous Given 07/19/13 1422)  iohexol (OMNIPAQUE) 350 MG/ML injection 80 mL (80 mLs Intravenous Contrast Given 07/19/13 1549)    New Prescriptions   No medications on file        Sherrie George,  PA-C 07/19/13 1839

## 2013-07-19 NOTE — ED Notes (Signed)
Pt resting st's she is ready to go home.  Pt ate [place] sandwich and applesauce.  Daughter at bedside.

## 2013-07-19 NOTE — Discharge Instructions (Signed)
Continue your current medication regimen as directed. Follow up with your doctor in 2 days for reevaluation. Return to Emergency department if your symptoms worse, you develop any chest pain or worsening shortness of breath.    Shortness of Breath Shortness of breath means you have trouble breathing. Shortness of breath needs medical care right away. HOME CARE   Do not smoke.  Avoid being around chemicals or things (paint fumes, dust) that may bother your breathing.  Rest as needed. Slowly begin your normal activities.  Only take medicines as told by your doctor.  Keep all doctor visits as told. GET HELP RIGHT AWAY IF:   Your shortness of breath gets worse.  You feel lightheaded, pass out (faint), or have a cough that is not helped by medicine.  You cough up blood.  You have pain with breathing.  You have pain in your chest, arms, shoulders, or belly (abdomen).  You have a fever.  You cannot walk up stairs or exercise the way you normally do.  You do not get better in the time expected.  You have a hard time doing normal activities even with rest.  You have problems with your medicines.  You have any new symptoms. MAKE SURE YOU:  Understand these instructions.  Will watch your condition.  Will get help right away if you are not doing well or get worse. Document Released: 09/10/2007 Document Revised: 09/23/2011 Document Reviewed: 06/09/2011 [facility] Patient Information 2014 [place], [place].  Back Pain, Adult Back pain is very common. The pain often gets better over time. The cause of back pain is usually not dangerous. Most people can learn to manage their back pain on their own.  HOME CARE   Stay active. Start with short walks on flat ground if you can. Try to walk farther each day.  Do not sit, drive, or stand in one place for more than 30 minutes. Do not stay in bed.  Do not avoid exercise or work. Activity can help your back heal faster.  Be careful when  you bend or lift an object. Bend at your knees, keep the object close to you, and do not twist.  Sleep on a firm mattress. Lie on your side, and bend your knees. If you lie on your back, put a pillow under your knees.  Only take medicines as told by your doctor.  Put ice on the injured area.  Put ice in a plastic bag.  Place a towel between your skin and the bag.  Leave the ice on for 15-20 minutes, 03-04 times a day for the first 2 to 3 days. After that, you can switch between ice and heat packs.  Ask your doctor about back exercises or massage.  Avoid feeling anxious or stressed. Find good ways to deal with stress, such as exercise. GET HELP RIGHT AWAY IF:   Your pain does not go away with rest or medicine.  Your pain does not go away in 1 week.  You have new problems.  You do not feel well.  The pain spreads into your legs.  You cannot control when you poop (bowel movement) or pee (urinate).  Your arms or legs feel weak or lose feeling (numbness).  You feel sick to your stomach (nauseous) or throw up (vomit).  You have belly (abdominal) pain.  You feel like you may pass out (faint). MAKE SURE YOU:   Understand these instructions.  Will watch your condition.  Will get help right away if you  are not doing well or get worse. Document Released: 09/10/2007 Document Revised: 06/16/2011 Document Reviewed: 08/12/2010 [facility] Patient Information 2014 [place].

## 2013-07-19 NOTE — ED Notes (Signed)
Placed patient on O2 2 liters.

## 2013-07-19 NOTE — ED Notes (Addendum)
Pt reports having onset one hour ago of back pain between her shoulder blades, sob, n/v and diaphoresis. Reports having productive cough, has taken 3 rounds of antibiotics. Reports taking 1 nitro pta, which helped relieve the discomfort.

## 2013-07-19 NOTE — ED Notes (Signed)
Pt to CT at this time.  Pt st's nausea has subsided

## 2013-10-03 ENCOUNTER — Ambulatory Visit
Admission: RE | Admit: 2013-10-03 | Discharge: 2013-10-03 | Disposition: A | Discharge status: Home / Self Care | Payer: Medicare Other | Source: Ambulatory Visit | Attending: Internal Medicine | Admitting: Internal Medicine

## 2013-10-03 ENCOUNTER — Other Ambulatory Visit: Payer: Self-pay | Admitting: Internal Medicine

## 2013-10-03 DIAGNOSIS — R071 Chest pain on breathing: Secondary | ICD-10-CM

## 2013-10-03 DIAGNOSIS — K5900 Constipation, unspecified: Secondary | ICD-10-CM

## 2013-10-20 ENCOUNTER — Other Ambulatory Visit: Payer: Self-pay | Admitting: Internal Medicine

## 2013-10-20 DIAGNOSIS — K219 Gastro-esophageal reflux disease without esophagitis: Secondary | ICD-10-CM

## 2013-10-25 ENCOUNTER — Other Ambulatory Visit: Payer: Self-pay | Admitting: Internal Medicine

## 2013-10-25 ENCOUNTER — Ambulatory Visit
Admission: RE | Admit: 2013-10-25 | Discharge: 2013-10-25 | Disposition: A | Discharge status: Home / Self Care | Payer: Medicare Other | Source: Ambulatory Visit | Attending: Internal Medicine | Admitting: Internal Medicine

## 2013-10-25 DIAGNOSIS — K219 Gastro-esophageal reflux disease without esophagitis: Secondary | ICD-10-CM

## 2014-01-31 ENCOUNTER — Other Ambulatory Visit: Payer: Self-pay | Admitting: Internal Medicine

## 2014-01-31 DIAGNOSIS — Z1231 Encounter for screening mammogram for malignant neoplasm of breast: Secondary | ICD-10-CM

## 2014-02-08 ENCOUNTER — Emergency Department (HOSPITAL_COMMUNITY): Payer: Medicare Other

## 2014-02-08 ENCOUNTER — Inpatient Hospital Stay (HOSPITAL_COMMUNITY)
Admission: EM | Admit: 2014-02-08 | Discharge: 2014-02-11 | DRG: 287 | Disposition: A | Discharge status: Home / Self Care | Payer: Medicare Other | Attending: Cardiovascular Disease | Admitting: Cardiovascular Disease

## 2014-02-08 ENCOUNTER — Encounter (HOSPITAL_COMMUNITY): Payer: Self-pay | Admitting: Emergency Medicine

## 2014-02-08 DIAGNOSIS — R079 Chest pain, unspecified: Secondary | ICD-10-CM | POA: Diagnosis present

## 2014-02-08 DIAGNOSIS — I25710 Atherosclerosis of autologous vein coronary artery bypass graft(s) with unstable angina pectoris: Secondary | ICD-10-CM | POA: Diagnosis present

## 2014-02-08 DIAGNOSIS — Z888 Allergy status to other drugs, medicaments and biological substances status: Secondary | ICD-10-CM | POA: Diagnosis not present

## 2014-02-08 DIAGNOSIS — I10 Essential (primary) hypertension: Secondary | ICD-10-CM | POA: Diagnosis present

## 2014-02-08 DIAGNOSIS — J189 Pneumonia, unspecified organism: Secondary | ICD-10-CM | POA: Diagnosis present

## 2014-02-08 DIAGNOSIS — Z955 Presence of coronary angioplasty implant and graft: Secondary | ICD-10-CM | POA: Diagnosis not present

## 2014-02-08 DIAGNOSIS — Z87891 Personal history of nicotine dependence: Secondary | ICD-10-CM

## 2014-02-08 DIAGNOSIS — K219 Gastro-esophageal reflux disease without esophagitis: Secondary | ICD-10-CM | POA: Diagnosis present

## 2014-02-08 DIAGNOSIS — Z9981 Dependence on supplemental oxygen: Secondary | ICD-10-CM | POA: Diagnosis not present

## 2014-02-08 DIAGNOSIS — I25110 Atherosclerotic heart disease of native coronary artery with unstable angina pectoris: Secondary | ICD-10-CM | POA: Diagnosis present

## 2014-02-08 DIAGNOSIS — R52 Pain, unspecified: Secondary | ICD-10-CM

## 2014-02-08 DIAGNOSIS — Z7982 Long term (current) use of aspirin: Secondary | ICD-10-CM | POA: Diagnosis not present

## 2014-02-08 DIAGNOSIS — J42 Unspecified chronic bronchitis: Secondary | ICD-10-CM

## 2014-02-08 DIAGNOSIS — I208 Other forms of angina pectoris: Secondary | ICD-10-CM

## 2014-02-08 DIAGNOSIS — R011 Cardiac murmur, unspecified: Secondary | ICD-10-CM | POA: Diagnosis present

## 2014-02-08 DIAGNOSIS — I25810 Atherosclerosis of coronary artery bypass graft(s) without angina pectoris: Secondary | ICD-10-CM | POA: Diagnosis present

## 2014-02-08 DIAGNOSIS — I77819 Aortic ectasia, unspecified site: Secondary | ICD-10-CM | POA: Diagnosis present

## 2014-02-08 DIAGNOSIS — E785 Hyperlipidemia, unspecified: Secondary | ICD-10-CM | POA: Diagnosis present

## 2014-02-08 DIAGNOSIS — M109 Gout, unspecified: Secondary | ICD-10-CM | POA: Diagnosis present

## 2014-02-08 DIAGNOSIS — E876 Hypokalemia: Secondary | ICD-10-CM | POA: Diagnosis present

## 2014-02-08 DIAGNOSIS — J449 Chronic obstructive pulmonary disease, unspecified: Secondary | ICD-10-CM | POA: Diagnosis present

## 2014-02-08 DIAGNOSIS — Z885 Allergy status to narcotic agent status: Secondary | ICD-10-CM | POA: Diagnosis not present

## 2014-02-08 DIAGNOSIS — I252 Old myocardial infarction: Secondary | ICD-10-CM | POA: Diagnosis not present

## 2014-02-08 DIAGNOSIS — Z951 Presence of aortocoronary bypass graft: Secondary | ICD-10-CM | POA: Diagnosis not present

## 2014-02-08 DIAGNOSIS — J440 Chronic obstructive pulmonary disease with acute lower respiratory infection: Secondary | ICD-10-CM | POA: Diagnosis present

## 2014-02-08 HISTORY — DX: Atherosclerosis of coronary artery bypass graft(s) without angina pectoris: I25.810

## 2014-02-08 LAB — BASIC METABOLIC PANEL
ANION GAP: 15 (ref 5–15)
BUN: 24 mg/dL — ABNORMAL HIGH (ref 6–23)
CALCIUM: 10 mg/dL (ref 8.4–10.5)
CO2: 27 meq/L (ref 19–32)
Chloride: 96 mEq/L (ref 96–112)
Creatinine, Ser: 1.14 mg/dL — ABNORMAL HIGH (ref 0.50–1.10)
GFR, EST AFRICAN AMERICAN: 51 mL/min — AB (ref >90)
GFR, EST NON AFRICAN AMERICAN: 44 mL/min — AB (ref >90)
Glucose, Bld: 141 mg/dL — ABNORMAL HIGH (ref 70–99)
Potassium: 3.7 mEq/L (ref 3.7–5.3)
SODIUM: 138 meq/L (ref 137–147)

## 2014-02-08 LAB — CBC
HCT: 40.3 % (ref 36.0–46.0)
Hemoglobin: 13.4 g/dL (ref 12.0–15.0)
MCH: 33.3 pg (ref 26.0–34.0)
MCHC: 33.3 g/dL (ref 30.0–36.0)
MCV: 100 fL (ref 78.0–100.0)
PLATELETS: 219 10*3/uL (ref 150–400)
RBC: 4.03 MIL/uL (ref 3.87–5.11)
RDW: 15.1 % (ref 11.5–15.5)
WBC: 4.1 10*3/uL (ref 4.0–10.5)

## 2014-02-08 LAB — TROPONIN I: Troponin I: <0.3 ng/mL (ref <0.30)

## 2014-02-08 LAB — T4, FREE: Free T4: 1.2 ng/dL (ref 0.80–1.80)

## 2014-02-08 LAB — TSH: TSH: 1.42 u[IU]/mL (ref 0.350–4.500)

## 2014-02-08 LAB — PROTIME-INR
INR: 1.08 (ref 0.00–1.49)
PROTHROMBIN TIME: 14.1 s (ref 11.6–15.2)

## 2014-02-08 LAB — PRO B NATRIURETIC PEPTIDE: Pro B Natriuretic peptide (BNP): 121.3 pg/mL (ref 0–450)

## 2014-02-08 LAB — APTT: aPTT: 26 seconds (ref 24–37)

## 2014-02-08 LAB — MAGNESIUM: Magnesium: 2.1 mg/dL (ref 1.5–2.5)

## 2014-02-08 LAB — I-STAT TROPONIN, ED: TROPONIN I, POC: 0.01 ng/mL (ref 0.00–0.08)

## 2014-02-08 MED ORDER — POLYETHYLENE GLYCOL 3350 17 G PO PACK
17.0000 g | PACK | Freq: Every day | ORAL | Status: DC | PRN
  Administered 2014-02-10: 17 g via ORAL
  Filled 2014-02-08: qty 1

## 2014-02-08 MED ORDER — HEPARIN (PORCINE) IN NACL 100-0.45 UNIT/ML-% IJ SOLN
750.0000 [IU]/h | INTRAMUSCULAR | Status: DC
  Administered 2014-02-08: 750 [IU]/h via INTRAVENOUS
  Filled 2014-02-08: qty 250

## 2014-02-08 MED ORDER — NITROGLYCERIN 0.4 MG SL SUBL: 0.4000 mg | SUBLINGUAL_TABLET | SUBLINGUAL | Status: DC | PRN

## 2014-02-08 MED ORDER — TEMAZEPAM 15 MG PO CAPS
15.0000 mg | ORAL_CAPSULE | Freq: Every day | ORAL | Status: DC
  Administered 2014-02-08 – 2014-02-11 (×5): 15 mg via ORAL
  Filled 2014-02-08 (×5): qty 1

## 2014-02-08 MED ORDER — ASPIRIN EC 81 MG PO TBEC
81.0000 mg | DELAYED_RELEASE_TABLET | Freq: Every day | ORAL | Status: DC
  Filled 2014-02-08: qty 1

## 2014-02-08 MED ORDER — ATORVASTATIN CALCIUM 20 MG PO TABS
20.0000 mg | ORAL_TABLET | Freq: Every day | ORAL | Status: DC
  Administered 2014-02-10: 20 mg via ORAL
  Filled 2014-02-08 (×3): qty 1

## 2014-02-08 MED ORDER — SODIUM CHLORIDE 0.9 % IV SOLN
INTRAVENOUS | Status: DC
  Administered 2014-02-08: 19:00:00 via INTRAVENOUS

## 2014-02-08 MED ORDER — ASPIRIN 300 MG RE SUPP: 300.0000 mg | RECTAL | Status: AC

## 2014-02-08 MED ORDER — ASPIRIN 325 MG PO TABS
325.0000 mg | ORAL_TABLET | Freq: Once | ORAL | Status: AC
  Administered 2014-02-08: 325 mg via ORAL
  Filled 2014-02-08: qty 1

## 2014-02-08 MED ORDER — EZETIMIBE 10 MG PO TABS
10.0000 mg | ORAL_TABLET | Freq: Every evening | ORAL | Status: DC
  Administered 2014-02-08 – 2014-02-10 (×3): 10 mg via ORAL
  Filled 2014-02-08 (×3): qty 1

## 2014-02-08 MED ORDER — ASPIRIN 81 MG PO CHEW: 324.0000 mg | CHEWABLE_TABLET | ORAL | Status: AC

## 2014-02-08 MED ORDER — HEPARIN BOLUS VIA INFUSION
3800.0000 [IU] | Freq: Once | INTRAVENOUS | Status: AC
  Administered 2014-02-08: 3800 [IU] via INTRAVENOUS
  Filled 2014-02-08: qty 3800

## 2014-02-08 MED ORDER — ASPIRIN EC 325 MG PO TBEC: 325.0000 mg | DELAYED_RELEASE_TABLET | Freq: Every day | ORAL | Status: DC

## 2014-02-08 MED ORDER — NITROGLYCERIN 0.4 MG SL SUBL
0.4000 mg | SUBLINGUAL_TABLET | SUBLINGUAL | Status: DC | PRN
  Administered 2014-02-08 (×2): 0.4 mg via SUBLINGUAL
  Filled 2014-02-08: qty 1

## 2014-02-08 MED ORDER — PANTOPRAZOLE SODIUM 40 MG PO TBEC
40.0000 mg | DELAYED_RELEASE_TABLET | Freq: Every day | ORAL | Status: DC
  Administered 2014-02-08 – 2014-02-11 (×4): 40 mg via ORAL
  Filled 2014-02-08 (×4): qty 1

## 2014-02-08 MED ORDER — OMEGA-3-ACID ETHYL ESTERS 1 G PO CAPS
1.0000 g | ORAL_CAPSULE | Freq: Two times a day (BID) | ORAL | Status: DC
  Administered 2014-02-08 – 2014-02-11 (×6): 1 g via ORAL
  Filled 2014-02-08 (×6): qty 1

## 2014-02-08 MED ORDER — ESCITALOPRAM OXALATE 10 MG PO TABS: 5.0000 mg | ORAL_TABLET | Freq: Every day | ORAL | Status: DC

## 2014-02-08 MED ORDER — TIOTROPIUM BROMIDE MONOHYDRATE 18 MCG IN CAPS
18.0000 ug | ORAL_CAPSULE | Freq: Every day | RESPIRATORY_TRACT | Status: DC
  Administered 2014-02-10 – 2014-02-11 (×2): 18 ug via RESPIRATORY_TRACT
  Filled 2014-02-08 (×2): qty 5

## 2014-02-08 MED ORDER — NITROGLYCERIN IN D5W 200-5 MCG/ML-% IV SOLN
5.0000 ug/min | INTRAVENOUS | Status: DC
  Administered 2014-02-08: 5 ug/min via INTRAVENOUS
  Filled 2014-02-08: qty 250

## 2014-02-08 MED ORDER — ACETAMINOPHEN 325 MG PO TABS
650.0000 mg | ORAL_TABLET | ORAL | Status: DC | PRN
  Filled 2014-02-08 (×2): qty 2

## 2014-02-08 MED ORDER — ONDANSETRON HCL 4 MG/2ML IJ SOLN: 4.0000 mg | Freq: Four times a day (QID) | INTRAMUSCULAR | Status: DC | PRN

## 2014-02-08 MED ORDER — METOPROLOL TARTRATE 12.5 MG HALF TABLET
12.5000 mg | ORAL_TABLET | Freq: Two times a day (BID) | ORAL | Status: DC
  Administered 2014-02-08 – 2014-02-10 (×3): 12.5 mg via ORAL
  Filled 2014-02-08 (×4): qty 1

## 2014-02-08 MED ORDER — MORPHINE SULFATE 2 MG/ML IJ SOLN
2.0000 mg | Freq: Once | INTRAMUSCULAR | Status: AC
  Administered 2014-02-08: 2 mg via INTRAVENOUS
  Filled 2014-02-08: qty 1

## 2014-02-08 NOTE — ED Notes (Signed)
Cardiology at bedside.

## 2014-02-08 NOTE — ED Notes (Signed)
Attempted report and reported nurse would call back for report.

## 2014-02-08 NOTE — ED Notes (Signed)
Pt reports CP and cold sweats for a few days with SOB.

## 2014-02-08 NOTE — ED Notes (Signed)
Attempted report 

## 2014-02-08 NOTE — ED Notes (Signed)
NP (cardiology) at bedside

## 2014-02-08 NOTE — Progress Notes (Signed)
ANTICOAGULATION CONSULT NOTE - Initial Consult  Pharmacy Consult for Heparin Indication: chest pain/ACS  Allergies  Allergen Reactions  . Lexapro [Escitalopram Oxalate] Other (See Comments)    Fatigue   . Soma [Carisoprodol] Other (See Comments)    Fatigue   . Wellbutrin [Bupropion] Other (See Comments)    "couldn't function and take it"  . Albuterol Sulfate Other (See Comments)    Albuterol HFA inhaler caused nervousness   . Codeine Hives  . Plavix [Clopidogrel Bisulfate] Other (See Comments)    unknown    Patient Measurements: Weight: 148 lb (67.132 kg) Heparin Dosing Weight: 63.8 kg  Vital Signs: Temp: 97.6 F (36.4 C) (11/04 1129) Temp Source: Oral (11/04 1129) BP: 130/92 mmHg (11/04 1715) Pulse Rate: 60 (11/04 1715)  Labs:  Recent Labs  02/08/14 1124  HGB 13.4  HCT 40.3  PLT 219  CREATININE 1.14*    CrCl cannot be calculated (Unknown ideal weight.).   Medical History: Past Medical History  Diagnosis Date  . COPD (chronic obstructive pulmonary disease)   . Gout   . Coronary atherosclerosis of native coronary artery 02/05/2012  . Hypertension   . High blood cholesterol   . Heart murmur     "all my life" (02/05/2012)  . Anginal pain   . Myocardial infarction 1990's; 2000's    "2 total, I think" (02/05/2012)  . Pneumonia     "several times" (02/05/2012)  . Chronic bronchitis   . Shortness of breath     "sometimes when I'm laying down; always when I do too much" (02/05/2012)  . History of blood transfusion   . External bleeding hemorrhoids     "act up at times" (02/05/2012)  . Chronic back pain     "all over" (02/05/2012)  . Depression     Medications:  Scheduled:  . aspirin  324 mg Oral NOW   Or  . aspirin  300 mg Rectal NOW  . [START ON 02/09/2014] aspirin EC  81 mg Oral Daily  . atorvastatin  20 mg Oral q1800  . metoprolol tartrate  12.5 mg Oral BID   Infusions:  . sodium chloride    . nitroGLYCERIN      Assessment: 80 yo f  admitted on 11/4 for CP x 3 days.  Patient is s/p CABG x 2 in 2005.  Patient's CP began about 1 week ago and initially would come and go but has been more consistent lately.  Pharmacy is consulted to begin heparin.  Will give a heparin bolus of 3,800 units (~59 units/kg) and then begin a heparin infusion of 750 units/hr (~11.7 units/kg/hr).  Will obtain a 8-hr HL @ 0200. Hgb 13.4, plts 219, INR 1.08.  Goal of Therapy:  Heparin level 0.3-0.7 units/ml Monitor platelets by anticoagulation protocol: Yes   Plan:  Give heparin bolus 3,800 units x 1 Begin heparin infusion at 750 units/hr 8-hr HL @ 0200 Monitor hgb/plts, s/s of bleeding, clinical course, plans for cath  Cassie L. Nicole Kindred, PharmD Clinical Pharmacy Resident Pager: [phone] 02/08/2014 8:01 PM

## 2014-02-08 NOTE — ED Notes (Signed)
Patient states started having chest pain x 3 days ago.  Patient states has gotten worse with mid chest pain no radiation.  Patient states has had some lightheadedness, cold sweats.   Patient denies nausea and vomiting.   Patient states history of heart problems and didn't come "because I'm stubborn".

## 2014-02-08 NOTE — H&P (Signed)
[name] is an 80 y.o. female.    Primary Cardiologist:Dr. Linard Millers PCP:  [name], MD  Chief Complaint: chest pain X 3 days HPI: 80 year old female with prior bypass surgery, prior myocardial infarction who presents to the emergency department after describing chest pain that began 1 week or so ago.  Initially pain would come and go now more consistent pressure ant. chest with no associated symptoms except SOB, fatigue and "cold sweats."  No nausea or vomiting.  She has over last several days stayed in her bed due to just feeling bad and the chest pressure.  She has had SL NTG without relief and no real relief with Morphine.  She has oxygen at home and has used this at times for the SOB.  Her daughter does have bronchitis and the pt herself has hx of PNA and bronchitis requiring the home Oxygen.  She denies fevers, no productive cough.   Last cardiac cath 2005 prior to CABG X 2 (left internal mammary artery to the left anterior descending, saphenous vein graft to the right coronary artery) by Dr. Myriam Jacobson.  Her troponin is neg.  EKG without acute changes.  Except occ PVC. With her MI she thought she had gas, so difficult for her to determine angina.       Past Medical History  Diagnosis Date  . COPD (chronic obstructive pulmonary disease)   . Gout   . Coronary atherosclerosis of native coronary artery 02/05/2012  . Hypertension   . High blood cholesterol   . Heart murmur     "all my life" (02/05/2012)  . Anginal pain   . Myocardial infarction 1990's; 2000's    "2 total, I think" (02/05/2012)  . Pneumonia     "several times" (02/05/2012)  . Chronic bronchitis   . Shortness of breath     "sometimes when I'm laying down; always when I do too much" (02/05/2012)  . History of blood transfusion   . External bleeding hemorrhoids     "act up at times" (02/05/2012)  . Chronic back pain     "all over" (02/05/2012)  . Depression     Past Surgical History    Procedure Laterality Date  . Appendectomy  ? 1970's  . Ectopic pregnancy surgery  1970's  . Abdominal hysterectomy  ? 1970's    "partial 1st time; complete 2nd" (02/05/2012)  . Cholecystectomy  ~ 2010  . Cataract extraction w/ intraocular lens  implant, bilateral  [phone]  . Coronary angioplasty with stent placement  2005  . Coronary artery bypass graft  2005    CABG X2    Family History  Problem Relation Age of Onset  . Anemia Neg Hx   . Arrhythmia Neg Hx   . Asthma Neg Hx   . Clotting disorder Neg Hx   . Fainting Neg Hx   . Heart attack Neg Hx   . Heart disease Neg Hx   . Heart failure Neg Hx   . Hyperlipidemia Neg Hx   . Hypertension Neg Hx   . Aneurysm    . CVA     Social History:  reports that she quit smoking about 2 years ago. Her smoking use included Cigarettes. She has a 50 pack-year smoking history. She has never used smokeless tobacco. She reports that she drinks alcohol. She reports that she does not use illicit drugs.  Allergies:  Allergies  Allergen Reactions  . Lexapro [Escitalopram Oxalate] Other (See  Comments)    Fatigue   . Soma [Carisoprodol] Other (See Comments)    Fatigue   . Wellbutrin [Bupropion] Other (See Comments)    "couldn't function and take it"  . Albuterol Sulfate Other (See Comments)    Albuterol HFA inhaler caused nervousness   . Codeine Hives  . Plavix [Clopidogrel Bisulfate] Other (See Comments)    unknown    OutPatient medications: Asa 325 mg daily lexapro 5 mg daily zetia 10 mg daily HCTZ 25 mg daily Magnesium 250 mg daily Anaprox prn NTG prn Fish oil 1 caps. TID protonix 40 mg daily miralax i pkt daily prn Potassium 99 mg daily restoril 15 mg at hs spiriva 18 mcg daily as needed Ultram 50 mg prn   Results for orders placed or performed during the hospital encounter of 02/08/14 (from the past 48 hour(s))  CBC     Status: None   Collection Time: 02/08/14 11:24 AM  Result Value Ref Range   WBC 4.1 4.0 - 10.5  K/uL   RBC 4.03 3.87 - 5.11 MIL/uL   Hemoglobin 13.4 12.0 - 15.0 g/dL   HCT 40.3 36.0 - 46.0 %   MCV 100.0 78.0 - 100.0 fL   MCH 33.3 26.0 - 34.0 pg   MCHC 33.3 30.0 - 36.0 g/dL   RDW 15.1 11.5 - 15.5 %   Platelets 219 150 - 400 K/uL  Basic metabolic panel     Status: Abnormal   Collection Time: 02/08/14 11:24 AM  Result Value Ref Range   Sodium 138 137 - 147 mEq/L   Potassium 3.7 3.7 - 5.3 mEq/L   Chloride 96 96 - 112 mEq/L   CO2 27 19 - 32 mEq/L   Glucose, Bld 141 (H) 70 - 99 mg/dL   BUN 24 (H) 6 - 23 mg/dL   Creatinine, Ser 1.14 (H) 0.50 - 1.10 mg/dL   Calcium 10.0 8.4 - 10.5 mg/dL   GFR calc non Af Amer 44 (L) >90 mL/min   GFR calc Af Amer 51 (L) >90 mL/min    Comment: (NOTE) The eGFR has been calculated using the CKD EPI equation. This calculation has not been validated in all clinical situations. eGFR's persistently <90 mL/min signify possible Chronic Kidney Disease.    Anion gap 15 5 - 15  I-stat troponin, ED (not at [facility])     Status: None   Collection Time: 02/08/14 12:04 PM  Result Value Ref Range   Troponin i, poc 0.01 0.00 - 0.08 ng/mL   Comment 3            Comment: Due to the release kinetics of cTnI, a negative result within the first hours of the onset of symptoms does not rule out myocardial infarction with certainty. If myocardial infarction is still suspected, repeat the test at appropriate intervals.    No results found.  ROS: General:no colds or fevers, no weight changes, + fatigue Skin:no rashes or ulcers HEENT:no blurred vision, no congestion CV:see HPI PUL:see HPI GI:no diarrhea constipation or melena, no indigestion GU:no hematuria, no dysuria MS:no joint pain, no claudication Neuro:no syncope, no lightheadedness Endo:no diabetes, no thyroid disease   Blood pressure 128/104, pulse 62, temperature 97.6 F (36.4 C), temperature source Oral, resp. rate 16, SpO2 91 %. PE: General:Pleasant affect, NAD- though obviously does not feel  well Skin:Warm and dry, brisk capillary refill HEENT:normocephalic, sclera clear, mucus membranes moist Neck:supple, no JVD, no bruits  Heart:S1S2 RRR without murmur, gallup, rub or click Lungs:clear without rales,  rhonchi, or wheezes, diminished some in bases XIH:WTUU, non tender, + BS, do not palpate liver spleen or masses Ext:no lower ext edema, 2+ pedal pulses, 2+ radial pulses Neuro:alert and oriented X 3, MAE, follows commands, + facial symmetry   EKG: Sinus rhythm with occasional Premature ventricular complexes Left posterior fascicular block Cannot rule out Anterior infarct , age undetermined Abnormal ECG  Assessment/Plan Principal Problem:   Angina at rest- admit to tele, IV heparin, IV NTG, serial tropinins- plan for cath vs. nuc study tomorrow.   Active Problems:   Coronary atherosclerosis of native coronary artery with hx of CABG 2005, no cath since that time   COPD (chronic obstructive pulmonary disease) with home oxygen use.  She stopped tobacco 2012, awaiting CXR results.   Hypertension   Chronic bronchitis   Chest pain   Hx GERD   [facility] R Nurse Practitioner Certified [place] Pager [phone] or after 5pm or weekends call [phone] 02/08/2014, 4:43 PM    Patient seen and examined. Agree with assessment and plan. Ms. Keira Bohlin is a very pleasant 79 year old female who had undergone CABG revascularization surgery in 2005 and had a left intramammary artery graft placed her LAD, and a saphenous vein graft to RCA by Dr. Nils Pyle.  She states recently she has noticed increasing shortness of breath with activity.  The day she experienced significant chest pressure which, in her mind is identical to the chest pain that she experienced prior to her CABG revascularization surgery.  Presently, her chest pain has improved and she currently is on an IV nitroglycerin drip.  She has a history of COPD and remotely had smoked cigarettes and quit  in 2012.  She has a history of chronic bronchitis.  I discussed options with her concerning further evaluation and after much discussion, the patient prefers definitive repeat cardiac catheterization. Will plan for tomorrow.    Troy Sine, MD, [facility] 02/08/2014 5:42 PM

## 2014-02-08 NOTE — ED Provider Notes (Signed)
CSN: [id number]     Arrival date & time 02/08/14  1112 History   First MD Initiated Contact with Patient 02/08/14 1116     Chief Complaint  Patient presents with  . Chest Pain     (Consider location/radiation/quality/duration/timing/severity/associated sxs/prior Treatment) HPI.....substernal chest pain intermittently for 1 week not associated with any activity. 6-7 episodes per day. Symptoms last for several minutes.. Review of systems positive for diaphoresis and nausea along with increasing dyspnea. Nonsmoker. Past medical history includes CABG, stents. Cardiologist is Dr. Daneen Schick  Past Medical History  Diagnosis Date  . COPD (chronic obstructive pulmonary disease)   . Gout   . Coronary atherosclerosis of native coronary artery 02/05/2012  . Hypertension   . High blood cholesterol   . Heart murmur     "all my life" (02/05/2012)  . Anginal pain   . Myocardial infarction 1990's; 2000's    "2 total, I think" (02/05/2012)  . Pneumonia     "several times" (02/05/2012)  . Chronic bronchitis   . Shortness of breath     "sometimes when I'm laying down; always when I do too much" (02/05/2012)  . History of blood transfusion   . External bleeding hemorrhoids     "act up at times" (02/05/2012)  . Chronic back pain     "all over" (02/05/2012)  . Depression    Past Surgical History  Procedure Laterality Date  . Appendectomy  ? 1970's  . Ectopic pregnancy surgery  1970's  . Abdominal hysterectomy  ? 1970's    "partial 1st time; complete 2nd" (02/05/2012)  . Cholecystectomy  ~ 2010  . Cataract extraction w/ intraocular lens  implant, bilateral  [phone]  . Coronary angioplasty with stent placement  2005  . Coronary artery bypass graft  2005    CABG X2   History reviewed. No pertinent family history. History  Substance Use Topics  . Smoking status: Former Smoker -- 1.00 packs/day for 50 years    Types: Cigarettes    Quit date: 04/02/2011  . Smokeless tobacco: Never  Used  . Alcohol Use: Yes     Comment: 02/05/2012 "have a drink of wine ~ q 6 months"   OB History    No data available     Review of Systems  All other systems reviewed and are negative.     Allergies  Lexapro; Soma; Wellbutrin; Albuterol sulfate; Codeine; and Plavix  Home Medications   Prior to Admission medications   Medication Sig Start Date End Date Taking? Authorizing Provider  aspirin EC 325 MG tablet Take 325 mg by mouth daily.   Yes Historical Provider, MD  ezetimibe (ZETIA) 10 MG tablet Take 10 mg by mouth every evening.    Yes Historical Provider, MD  hydrochlorothiazide (HYDRODIURIL) 25 MG tablet Take 25 mg by mouth daily.   Yes Historical Provider, MD  Magnesium 250 MG TABS Take 250 mg by mouth daily.   Yes Historical Provider, MD  naproxen sodium (ANAPROX) 220 MG tablet Take 440-660 mg by mouth daily as needed (pain).   Yes Historical Provider, MD  NITROSTAT 0.4 MG SL tablet Place 0.4 mg under the tongue every 5 (five) minutes as needed for chest pain.  01/06/13  Yes Historical Provider, MD  Omega-3 Fatty Acids (FISH OIL PO) Take 1 capsule by mouth 3 (three) times daily.   Yes Historical Provider, MD  pantoprazole (PROTONIX) 40 MG tablet Take 40 mg by mouth daily.   Yes Historical Provider, MD  polyethylene glycol (MIRALAX /  GLYCOLAX) packet Take 17 g by mouth daily as needed for mild constipation.    Yes Historical Provider, MD  Potassium 99 MG TABS Take 99 mg by mouth daily.   Yes Historical Provider, MD  temazepam (RESTORIL) 15 MG capsule Take 15-30 mg by mouth at bedtime. 15 mg to start, may take additional 15 mg if needed   Yes Historical Provider, MD  tiotropium (SPIRIVA) 18 MCG inhalation capsule Place 18 mcg into inhaler and inhale daily as needed.   Yes Historical Provider, MD  traMADol (ULTRAM) 50 MG tablet Take 50-100 mg by mouth every 6 (six) hours as needed. For pain   Yes Historical Provider, MD  escitalopram (LEXAPRO) 5 MG tablet Take 5 mg by mouth daily.  07/13/13   Historical Provider, MD   BP 135/82 mmHg  Pulse 65  Temp(Src) 97.6 F (36.4 C) (Oral)  Resp 18  SpO2 92% Physical Exam  Constitutional: She is oriented to person, place, and time. She appears well-developed and well-nourished.  HENT:  Head: Normocephalic and atraumatic.  Eyes: Conjunctivae and EOM are normal. Pupils are equal, round, and reactive to light.  Neck: Normal range of motion. Neck supple.  Cardiovascular: Normal rate, regular rhythm and normal heart sounds.   Pulmonary/Chest: Effort normal and breath sounds normal.  Abdominal: Soft. Bowel sounds are normal.  Musculoskeletal: Normal range of motion.  Neurological: She is alert and oriented to person, place, and time.  Skin: Skin is warm and dry.  Psychiatric: She has a normal mood and affect. Her behavior is normal.  Nursing note and vitals reviewed.   ED Course  Procedures (including critical care time) Labs Review Labs Reviewed  BASIC METABOLIC PANEL - Abnormal; Notable for the following:    Glucose, Bld 141 (*)    BUN 24 (*)    Creatinine, Ser 1.14 (*)    GFR calc non Af Amer 44 (*)    GFR calc Af Amer 51 (*)    All other components within normal limits  CBC  I-STAT TROPOININ, ED    Imaging Review No results found.   EKG Interpretation   Date/Time:  Wednesday February 08 2014 11:17:56 EST Ventricular Rate:  76 PR Interval:  184 QRS Duration: 94 QT Interval:  392 QTC Calculation: 441 R Axis:   121 Text Interpretation:  Sinus rhythm with occasional Premature ventricular  complexes Left posterior fascicular block Cannot rule out Anterior infarct  , age undetermined Abnormal ECG Confirmed by Amonte Brookover  MD, Lonia Roane ([id number]) on  02/08/2014 11:29:06 AM      MDM   Final diagnoses:  Chest pain, unspecified chest pain type   Patient has known coronary artery disease. She is status post CABG and stents. She complains of chest pain for 1 week with dyspnea and diaphoresis. Discussed with cardiologist.  Admit.    Nat Christen, MD 02/08/14 1700

## 2014-02-08 NOTE — ED Notes (Signed)
Spoke to doctor.  Holding third dose of nitro due to pt taking 1 dose at 10:20 before arriving to ED.

## 2014-02-09 ENCOUNTER — Encounter (HOSPITAL_COMMUNITY)
Admission: EM | Disposition: A | Discharge status: Home / Self Care | Payer: Self-pay | Source: Home / Self Care | Attending: Cardiovascular Disease

## 2014-02-09 DIAGNOSIS — I25810 Atherosclerosis of coronary artery bypass graft(s) without angina pectoris: Secondary | ICD-10-CM

## 2014-02-09 DIAGNOSIS — E876 Hypokalemia: Secondary | ICD-10-CM

## 2014-02-09 HISTORY — DX: Atherosclerosis of coronary artery bypass graft(s) without angina pectoris: I25.810

## 2014-02-09 HISTORY — PX: CARDIAC CATHETERIZATION: SHX172

## 2014-02-09 HISTORY — PX: LEFT HEART CATHETERIZATION WITH CORONARY/GRAFT ANGIOGRAM: SHX5450

## 2014-02-09 LAB — LIPID PANEL
CHOL/HDL RATIO: 5.1 ratio
Cholesterol: 137 mg/dL (ref 0–200)
HDL: 27 mg/dL — AB (ref >39)
LDL Cholesterol: 80 mg/dL (ref 0–99)
Triglycerides: 150 mg/dL — ABNORMAL HIGH (ref <150)
VLDL: 30 mg/dL (ref 0–40)

## 2014-02-09 LAB — BASIC METABOLIC PANEL
ANION GAP: 14 (ref 5–15)
BUN: 27 mg/dL — ABNORMAL HIGH (ref 6–23)
CO2: 25 meq/L (ref 19–32)
Calcium: 9.2 mg/dL (ref 8.4–10.5)
Chloride: 101 mEq/L (ref 96–112)
Creatinine, Ser: 1.15 mg/dL — ABNORMAL HIGH (ref 0.50–1.10)
GFR calc Af Amer: 51 mL/min — ABNORMAL LOW (ref >90)
GFR calc non Af Amer: 44 mL/min — ABNORMAL LOW (ref >90)
GLUCOSE: 108 mg/dL — AB (ref 70–99)
Potassium: 3.4 mEq/L — ABNORMAL LOW (ref 3.7–5.3)
SODIUM: 140 meq/L (ref 137–147)

## 2014-02-09 LAB — CBC
HCT: 36.1 % (ref 36.0–46.0)
Hemoglobin: 12.1 g/dL (ref 12.0–15.0)
MCH: 33.1 pg (ref 26.0–34.0)
MCHC: 33.5 g/dL (ref 30.0–36.0)
MCV: 98.6 fL (ref 78.0–100.0)
PLATELETS: 196 10*3/uL (ref 150–400)
RBC: 3.66 MIL/uL — AB (ref 3.87–5.11)
RDW: 15.1 % (ref 11.5–15.5)
WBC: 4 10*3/uL (ref 4.0–10.5)

## 2014-02-09 LAB — HEMOGLOBIN A1C
Hgb A1c MFr Bld: 5.9 % — ABNORMAL HIGH (ref <5.7)
Mean Plasma Glucose: 123 mg/dL — ABNORMAL HIGH (ref <117)

## 2014-02-09 LAB — HEPARIN LEVEL (UNFRACTIONATED)
HEPARIN UNFRACTIONATED: 0.76 [IU]/mL — AB (ref 0.30–0.70)
HEPARIN UNFRACTIONATED: 0.69 [IU]/mL (ref 0.30–0.70)

## 2014-02-09 LAB — POCT ACTIVATED CLOTTING TIME: ACTIVATED CLOTTING TIME: 124 s

## 2014-02-09 LAB — TROPONIN I

## 2014-02-09 SURGERY — LEFT HEART CATHETERIZATION WITH CORONARY/GRAFT ANGIOGRAM
Anesthesia: LOCAL

## 2014-02-09 MED ORDER — SODIUM CHLORIDE 0.9 % IV SOLN: 1.0000 mL/kg/h | INTRAVENOUS | Status: DC

## 2014-02-09 MED ORDER — LIDOCAINE HCL (PF) 1 % IJ SOLN
INTRAMUSCULAR | Status: AC
Start: 2014-02-09 — End: 2014-02-09
  Filled 2014-02-09: qty 30

## 2014-02-09 MED ORDER — ASPIRIN 81 MG PO CHEW
81.0000 mg | CHEWABLE_TABLET | ORAL | Status: AC
  Administered 2014-02-09: 81 mg via ORAL
  Filled 2014-02-09: qty 1

## 2014-02-09 MED ORDER — ONDANSETRON HCL 4 MG/2ML IJ SOLN: 4.0000 mg | Freq: Four times a day (QID) | INTRAMUSCULAR | Status: DC | PRN

## 2014-02-09 MED ORDER — FENTANYL CITRATE 0.05 MG/ML IJ SOLN
INTRAMUSCULAR | Status: AC
  Filled 2014-02-09: qty 2

## 2014-02-09 MED ORDER — SODIUM CHLORIDE 0.9 % IJ SOLN: 3.0000 mL | Freq: Two times a day (BID) | INTRAMUSCULAR | Status: DC

## 2014-02-09 MED ORDER — POTASSIUM CHLORIDE CRYS ER 20 MEQ PO TBCR
40.0000 meq | EXTENDED_RELEASE_TABLET | Freq: Once | ORAL | Status: AC
  Administered 2014-02-09: 40 meq via ORAL
  Filled 2014-02-09: qty 2

## 2014-02-09 MED ORDER — SODIUM CHLORIDE 0.9 % IV SOLN: 250.0000 mL | INTRAVENOUS | Status: DC | PRN

## 2014-02-09 MED ORDER — SODIUM CHLORIDE 0.9 % IJ SOLN: 3.0000 mL | INTRAMUSCULAR | Status: DC | PRN

## 2014-02-09 MED ORDER — ACETAMINOPHEN 325 MG PO TABS
650.0000 mg | ORAL_TABLET | ORAL | Status: DC | PRN
  Administered 2014-02-09 – 2014-02-10 (×2): 650 mg via ORAL

## 2014-02-09 MED ORDER — SODIUM CHLORIDE 0.9 % IV SOLN
INTRAVENOUS | Status: DC
  Administered 2014-02-09 (×2): via INTRAVENOUS

## 2014-02-09 MED ORDER — ASPIRIN EC 81 MG PO TBEC
81.0000 mg | DELAYED_RELEASE_TABLET | Freq: Every day | ORAL | Status: DC
  Administered 2014-02-10 – 2014-02-11 (×2): 81 mg via ORAL
  Filled 2014-02-09 (×2): qty 1

## 2014-02-09 MED ORDER — MIDAZOLAM HCL 2 MG/2ML IJ SOLN
INTRAMUSCULAR | Status: AC
  Filled 2014-02-09: qty 2

## 2014-02-09 MED ORDER — NITROGLYCERIN 1 MG/10 ML FOR IR/CATH LAB
INTRA_ARTERIAL | Status: AC
  Filled 2014-02-09: qty 10

## 2014-02-09 MED ORDER — HEPARIN (PORCINE) IN NACL 2-0.9 UNIT/ML-% IJ SOLN
INTRAMUSCULAR | Status: AC
  Filled 2014-02-09: qty 1000

## 2014-02-09 NOTE — Plan of Care (Signed)
Problem: Phase I Progression Outcomes Goal: Anginal pain relieved Outcome: Completed/Met Date Met:  02/09/14

## 2014-02-09 NOTE — CV Procedure (Signed)
[name] is a 80 y.o. female    [id number]  [id number] LOCATION:  FACILITY: [facility]  PHYSICIAN: Troy Sine, MD, [facility] 1931/12/02   DATE OF PROCEDURE:  02/09/2014    CARDIAC CATHETERIZATION     HISTORY:    DETRICE CALES is a 80 y.o. female has known coronary artery disease and is status post CABG revascularization surgery in 2005 by Dr. Tharon Aquas Tright at which time a LIMA graft was placed to the mid LAD and the saphenous vein graft was placed to the RCA.  She presented to the hospital yesterday with chest pain which she felt was identical to her pre-CABG discomfort.  She has a history of COPD and bronchitis. In light of her worrisome symptoms with similar quality to her prior discomfort definitive cardiac catheterization was recommended.   PROCEDURE: Left heart catheterization: Coronary angiography in the native coronary arteries, saphenous vein graft, and left internal mammary artery; left ventriculography  The patient was brought to the [facility] cardiac catherization labaratory in the postabsorptive state. She was premedicated with Versed 1 mg and fentanyl 25 g. Her right groin was prepped and shaved in usual sterile fashion. Xylocaine 1% was used for local anesthesia. A 5 French sheath was inserted into the R femoral artery. Diagnostic catheterizatiion was done with 5 [place] FL4, FR4, LIMA and pigtail catheters. Left ventriculography was done with 28 cc Omnipaque contrast. Hemostasis was obtained by direct manual compression. The patient tolerated the procedure well. She returned to her room in satisfactory condition.   HEMODYNAMICS:   Central Aorta: 170/57   Left Ventricle: 170/11  ANGIOGRAPHY:  Left main: Moderate size vessel which trifurcated into the LAD, ramus intermediate vessel, and left circumflex coronary artery.  LAD: Moderate size vessel that had percent narrowing proximally.  After the first septal perforator artery.  In the mid LAD there was  retrograde filling of an atretic LIMA system. The LAD extended to the apex and was otherwise free of significant disease.  Ramus Intermediate: Moderate sized normal vessel  Left circumflex: Moderate size vessel which gave rise to one marginal branch.  There was 30% smooth distal narrowing.  Right coronary artery: Totally occluded proximally prior to a remotely placed mid RCA stent.  LIMA to LAD: there was 30% proximal left subclavian stenosis prior to the takeoff of the LIMA vessel.  The LIMA graft was atretic.  SVG to distal RCA was widely patent.  The distal RCA beyond the graft anastomosis was free of significant disease.   Left ventriculography revealed  Normal global LV contractility with an ejection fraction of 55%.  There was a small region of mild inferior mid hypocontractility.  The aortic root was significantly dilated.    IMPRESSION:  Normal global LV contractility with ejection fraction of 55% but with evidence for mild mid inferior hypocontractility.  Dilated ascending aorta.  Total occlusion of the native RCA proximally prior to remotely placement RCA stent, with mild proximal 30% LAD stenosis and 30% distal circumflex stenoses.  Atretic LIMA graft to the mid LAD.  Patent saphenous vein graft supplying the distal RCA.  RECOMMENDATION:  Medical therapy.  Further evaluation of the dilated ascending aorta is recommended with CT angiography following adequate hydration from her catheterization study.  Troy Sine, MD, [facility] 02/09/2014 2:12 PM

## 2014-02-09 NOTE — H&P (View-Only) (Signed)
Patient Profile: 80 y/o female with known CAD s/p CABG x2 in 2005 (LIMA-LAD SVG-RCA), admitted for chest pain concerning for [place]. Enzymes negative x3.    Subjective: No recurrent CP overnight.   Objective: Vital signs in last 24 hours: Temp:  [97.6 F (36.4 C)-98 F (36.7 C)] 98 F (36.7 C) (11/05 0526) Pulse Rate:  [57-73] 67 (11/04 2031) Resp:  [14-25] 14 (11/04 2031) BP: (104-157)/(62-104) 104/64 mmHg (11/04 2031) SpO2:  [91 %-99 %] 92 % (11/04 2031) Weight:  [145 lb 1.6 oz (65.817 kg)-148 lb (67.132 kg)] 146 lb 12.8 oz (66.588 kg) (11/05 0526) Last BM Date: 02/08/14  Intake/Output from previous day:   Intake/Output this shift:    Medications Current Facility-Administered Medications  Medication Dose Route Frequency Provider Last Rate Last Dose  . 0.9 %  sodium chloride infusion   Intravenous Continuous Isaiah Serge, NP 10 mL/hr at 02/08/14 1900    . 0.9 %  sodium chloride infusion  250 mL Intravenous PRN Isaiah Serge, NP      . 0.9 %  sodium chloride infusion  1 mL/kg/hr Intravenous Continuous Troy Sine, MD      . acetaminophen (TYLENOL) tablet 650 mg  650 mg Oral Q4H PRN Isaiah Serge, NP      . aspirin chewable tablet 324 mg  324 mg Oral NOW Isaiah Serge, NP   324 mg at 02/08/14 1743   Or  . aspirin suppository 300 mg  300 mg Rectal NOW Isaiah Serge, NP      . aspirin chewable tablet 81 mg  81 mg Oral Pre-Cath Troy Sine, MD      . aspirin EC tablet 81 mg  81 mg Oral Daily Isaiah Serge, NP      . atorvastatin (LIPITOR) tablet 20 mg  20 mg Oral q1800 Isaiah Serge, NP   20 mg at 02/08/14 2039  . ezetimibe (ZETIA) tablet 10 mg  10 mg Oral QPM Isaiah Serge, NP   10 mg at 02/08/14 2039  . heparin ADULT infusion 100 units/mL (25000 units/250 mL)  750 Units/hr Intravenous Continuous Cassie Jodean Lima, RPH 7.5 mL/hr at 02/08/14 1858 750 Units/hr at 02/08/14 1858  . metoprolol tartrate (LOPRESSOR) tablet 12.5 mg  12.5 mg Oral BID Isaiah Serge, NP   12.5  mg at 02/08/14 2058  . nitroGLYCERIN (NITROSTAT) SL tablet 0.4 mg  0.4 mg Sublingual Q5 min PRN Nat Christen, MD   0.4 mg at 02/08/14 1245  . nitroGLYCERIN 50 mg in dextrose 5 % 250 mL (0.2 mg/mL) infusion  5 mcg/min Intravenous Titrated Isaiah Serge, NP 1.5 mL/hr at 02/09/14 0424 5 mcg/min at 02/09/14 0424  . omega-3 acid ethyl esters (LOVAZA) capsule 1 g  1 g Oral BID Isaiah Serge, NP   1 g at 02/08/14 2058  . ondansetron (ZOFRAN) injection 4 mg  4 mg Intravenous Q6H PRN Isaiah Serge, NP      . pantoprazole (PROTONIX) EC tablet 40 mg  40 mg Oral Daily Isaiah Serge, NP   40 mg at 02/08/14 2039  . polyethylene glycol (MIRALAX / GLYCOLAX) packet 17 g  17 g Oral Daily PRN Isaiah Serge, NP      . sodium chloride 0.9 % injection 3 mL  3 mL Intravenous Q12H Isaiah Serge, NP      . sodium chloride 0.9 % injection 3 mL  3 mL Intravenous PRN Isaiah Serge, NP      .  temazepam (RESTORIL) capsule 15-30 mg  15-30 mg Oral QHS Isaiah Serge, NP   15 mg at 02/08/14 2057  . tiotropium (SPIRIVA) inhalation capsule 18 mcg  18 mcg Inhalation Daily Isaiah Serge, NP   18 mcg at 02/08/14 2000    PE: General appearance: alert, cooperative and no distress Lungs: clear to auscultation bilaterally Heart: regular rate and rhythm Extremities: no LEE Pulses: 2+ and symmetric Skin: warm and dry Neurologic: Grossly normal  Lab Results:   Recent Labs  02/08/14 1124 02/09/14 0500  WBC 4.1 4.0  HGB 13.4 12.1  HCT 40.3 36.1  PLT 219 196   BMET  Recent Labs  02/08/14 1124 02/09/14 0500  NA 138 140  K 3.7 3.4*  CL 96 101  CO2 27 25  GLUCOSE 141* 108*  BUN 24* 27*  CREATININE 1.14* 1.15*  CALCIUM 10.0 9.2   PT/INR  Recent Labs  02/08/14 1727  LABPROT 14.1  INR 1.08   Cholesterol  Recent Labs  02/09/14 0500  CHOL 137   Cardiac Panel (last 3 results)  Recent Labs  02/08/14 1727 02/08/14 2317 02/09/14 0517  TROPONINI <0.30 <0.30 <0.30   Lipid Panel     Component Value  Date/Time   CHOL 137 02/09/2014 0500   TRIG 150* 02/09/2014 0500   HDL 27* 02/09/2014 0500   CHOLHDL 5.1 02/09/2014 0500   VLDL 30 02/09/2014 0500   LDLCALC 80 02/09/2014 0500    Assessment/Plan  Principal Problem:   Angina at rest Active Problems:   Coronary atherosclerosis of native coronary artery   COPD (chronic obstructive pulmonary disease)   Hypertension   Chronic bronchitis   Chest pain  1. Unstable Angina: h/o CAGB x 2 in 2005. Cardiac enzymes negative x 3. Currently CP free on IV heparin and IV nitro. Plan is for definitive assessment of her coronaries with a LHC today. INR is 1.08. Continue ASA, statin and BB.   2. Hypertension: BP well controlled. Continue BB.   3. Hypokalemia: K is 3.4. Will replete with 40 mEq supplemental K.     LOS: 1 day    Brittainy M. Rosita Fire, PA-C 02/09/2014 8:15 AM

## 2014-02-09 NOTE — Progress Notes (Signed)
Patient Profile: 80 y/o female with known CAD s/p CABG x2 in 2005 (LIMA-LAD SVG-RCA), admitted for chest pain concerning for [place]. Enzymes negative x3.    Subjective: No recurrent CP overnight.   Objective: Vital signs in last 24 hours: Temp:  [97.6 F (36.4 C)-98 F (36.7 C)] 98 F (36.7 C) (11/05 0526) Pulse Rate:  [57-73] 67 (11/04 2031) Resp:  [14-25] 14 (11/04 2031) BP: (104-157)/(62-104) 104/64 mmHg (11/04 2031) SpO2:  [91 %-99 %] 92 % (11/04 2031) Weight:  [145 lb 1.6 oz (65.817 kg)-148 lb (67.132 kg)] 146 lb 12.8 oz (66.588 kg) (11/05 0526) Last BM Date: 02/08/14  Intake/Output from previous day:   Intake/Output this shift:    Medications Current Facility-Administered Medications  Medication Dose Route Frequency Provider Last Rate Last Dose  . 0.9 %  sodium chloride infusion   Intravenous Continuous Isaiah Serge, NP 10 mL/hr at 02/08/14 1900    . 0.9 %  sodium chloride infusion  250 mL Intravenous PRN Isaiah Serge, NP      . 0.9 %  sodium chloride infusion  1 mL/kg/hr Intravenous Continuous Troy Sine, MD      . acetaminophen (TYLENOL) tablet 650 mg  650 mg Oral Q4H PRN Isaiah Serge, NP      . aspirin chewable tablet 324 mg  324 mg Oral NOW Isaiah Serge, NP   324 mg at 02/08/14 1743   Or  . aspirin suppository 300 mg  300 mg Rectal NOW Isaiah Serge, NP      . aspirin chewable tablet 81 mg  81 mg Oral Pre-Cath Troy Sine, MD      . aspirin EC tablet 81 mg  81 mg Oral Daily Isaiah Serge, NP      . atorvastatin (LIPITOR) tablet 20 mg  20 mg Oral q1800 Isaiah Serge, NP   20 mg at 02/08/14 2039  . ezetimibe (ZETIA) tablet 10 mg  10 mg Oral QPM Isaiah Serge, NP   10 mg at 02/08/14 2039  . heparin ADULT infusion 100 units/mL (25000 units/250 mL)  750 Units/hr Intravenous Continuous Cassie Jodean Lima, RPH 7.5 mL/hr at 02/08/14 1858 750 Units/hr at 02/08/14 1858  . metoprolol tartrate (LOPRESSOR) tablet 12.5 mg  12.5 mg Oral BID Isaiah Serge, NP   12.5  mg at 02/08/14 2058  . nitroGLYCERIN (NITROSTAT) SL tablet 0.4 mg  0.4 mg Sublingual Q5 min PRN Nat Christen, MD   0.4 mg at 02/08/14 1245  . nitroGLYCERIN 50 mg in dextrose 5 % 250 mL (0.2 mg/mL) infusion  5 mcg/min Intravenous Titrated Isaiah Serge, NP 1.5 mL/hr at 02/09/14 0424 5 mcg/min at 02/09/14 0424  . omega-3 acid ethyl esters (LOVAZA) capsule 1 g  1 g Oral BID Isaiah Serge, NP   1 g at 02/08/14 2058  . ondansetron (ZOFRAN) injection 4 mg  4 mg Intravenous Q6H PRN Isaiah Serge, NP      . pantoprazole (PROTONIX) EC tablet 40 mg  40 mg Oral Daily Isaiah Serge, NP   40 mg at 02/08/14 2039  . polyethylene glycol (MIRALAX / GLYCOLAX) packet 17 g  17 g Oral Daily PRN Isaiah Serge, NP      . sodium chloride 0.9 % injection 3 mL  3 mL Intravenous Q12H Isaiah Serge, NP      . sodium chloride 0.9 % injection 3 mL  3 mL Intravenous PRN Isaiah Serge, NP      .  temazepam (RESTORIL) capsule 15-30 mg  15-30 mg Oral QHS Isaiah Serge, NP   15 mg at 02/08/14 2057  . tiotropium (SPIRIVA) inhalation capsule 18 mcg  18 mcg Inhalation Daily Isaiah Serge, NP   18 mcg at 02/08/14 2000    PE: General appearance: alert, cooperative and no distress Lungs: clear to auscultation bilaterally Heart: regular rate and rhythm Extremities: no LEE Pulses: 2+ and symmetric Skin: warm and dry Neurologic: Grossly normal  Lab Results:   Recent Labs  02/08/14 1124 02/09/14 0500  WBC 4.1 4.0  HGB 13.4 12.1  HCT 40.3 36.1  PLT 219 196   BMET  Recent Labs  02/08/14 1124 02/09/14 0500  NA 138 140  K 3.7 3.4*  CL 96 101  CO2 27 25  GLUCOSE 141* 108*  BUN 24* 27*  CREATININE 1.14* 1.15*  CALCIUM 10.0 9.2   PT/INR  Recent Labs  02/08/14 1727  LABPROT 14.1  INR 1.08   Cholesterol  Recent Labs  02/09/14 0500  CHOL 137   Cardiac Panel (last 3 results)  Recent Labs  02/08/14 1727 02/08/14 2317 02/09/14 0517  TROPONINI <0.30 <0.30 <0.30   Lipid Panel     Component Value  Date/Time   CHOL 137 02/09/2014 0500   TRIG 150* 02/09/2014 0500   HDL 27* 02/09/2014 0500   CHOLHDL 5.1 02/09/2014 0500   VLDL 30 02/09/2014 0500   LDLCALC 80 02/09/2014 0500    Assessment/Plan  Principal Problem:   Angina at rest Active Problems:   Coronary atherosclerosis of native coronary artery   COPD (chronic obstructive pulmonary disease)   Hypertension   Chronic bronchitis   Chest pain  1. Unstable Angina: h/o CAGB x 2 in 2005. Cardiac enzymes negative x 3. Currently CP free on IV heparin and IV nitro. Plan is for definitive assessment of her coronaries with a LHC today. INR is 1.08. Continue ASA, statin and BB.   2. Hypertension: BP well controlled. Continue BB.   3. Hypokalemia: K is 3.4. Will replete with 40 mEq supplemental K.     LOS: 1 day    Belinda Bringhurst M. Rosita Fire, PA-C 02/09/2014 8:15 AM

## 2014-02-09 NOTE — Progress Notes (Signed)
Site area: rt femoral arterial sheath Site Prior to Removal:  Level 0 Pressure Applied For: 20 minutes Manual:   yes Patient Status During Pull:  stable Post Pull Site:  Level 0 Post Pull Instructions Given:  yes Post Pull Pulses Present: yes Dressing Applied:  tegaderm Bedrest begins @ 1440 Comments: no complications

## 2014-02-09 NOTE — Interval H&P Note (Signed)
Cath Lab Visit (complete for each Cath Lab visit)  Clinical Evaluation Leading to the Procedure:   ACS: No.  Non-ACS:    Anginal Classification: CCS III  Anti-ischemic medical therapy: Maximal Therapy (2 or more classes of medications)  Non-Invasive Test Results: No non-invasive testing performed  Prior CABG: Previous CABG      History and Physical Interval Note:  02/09/2014 1:07 PM  [name]  has presented today for surgery, with the diagnosis of cp  The various methods of treatment have been discussed with the patient and family. After consideration of risks, benefits and other options for treatment, the patient has consented to  Procedure(s): LEFT HEART CATHETERIZATION WITH CORONARY/GRAFT ANGIOGRAM (N/A) as a surgical intervention .  The patient's history has been reviewed, patient examined, no change in status, stable for surgery.  I have reviewed the patient's chart and labs.  Questions were answered to the patient's satisfaction.     Krystena Reitter A

## 2014-02-09 NOTE — Progress Notes (Signed)
ANTICOAGULATION CONSULT NOTE - Follow Up Consult  Pharmacy Consult for heparin  Indication: chest pain/ACS  Allergies  Allergen Reactions  . Lexapro [Escitalopram Oxalate] Other (See Comments)    Fatigue   . Soma [Carisoprodol] Other (See Comments)    Fatigue   . Wellbutrin [Bupropion] Other (See Comments)    "couldn't function and take it"  . Albuterol Sulfate Other (See Comments)    Albuterol HFA inhaler caused nervousness   . Codeine Hives  . Plavix [Clopidogrel Bisulfate] Other (See Comments)    unknown    Patient Measurements: Height: 5\' 1"  (154.9 cm) Weight: 145 lb 1.6 oz (65.817 kg) IBW/kg (Calculated) : 47.8 Heparin Dosing Weight:   Vital Signs: Temp: 97.9 F (36.6 C) (11/04 2031) BP: 104/64 mmHg (11/04 2031) Pulse Rate: 67 (11/04 2031)  Labs:  Recent Labs  02/08/14 1124 02/08/14 1727 02/08/14 2317 02/09/14 0139  HGB 13.4  --   --   --   HCT 40.3  --   --   --   PLT 219  --   --   --   APTT  --  26  --   --   LABPROT  --  14.1  --   --   INR  --  1.08  --   --   HEPARINUNFRC  --   --   --  0.76*  CREATININE 1.14*  --   --   --   TROPONINI  --  <0.30 <0.30  --     Estimated Creatinine Clearance: 34.2 mL/min (by C-G formula based on Cr of 1.14).   Medications:  Heparin bolus of 3800 then 750/hr  Assessment: 60 unit/kg bolus is most likely reason HL is high in this 80 yo with poor renal function. Infusion is less than 12 units/kg. HL is 0.76. Will continue at current rate and recheck in 8 hours as level will most likely normalize in therapeutic range once bolus is cleared. No bleeding is noted.  Goal of Therapy:  Heparin level 0.3-0.7 units/ml Monitor platelets by anticoagulation protocol: Yes   Plan:  Continue at 750 units/hr and recheck HL in 6-8 hours.   Curlene Dolphin 02/09/2014,4:04 AM

## 2014-02-09 NOTE — Plan of Care (Signed)
Problem: Phase I Progression Outcomes Goal: Voiding-avoid urinary catheter unless indicated Outcome: Completed/Met Date Met:  02/09/14     

## 2014-02-10 ENCOUNTER — Encounter (HOSPITAL_COMMUNITY): Payer: Self-pay | Admitting: Cardiology

## 2014-02-10 ENCOUNTER — Inpatient Hospital Stay (HOSPITAL_COMMUNITY): Payer: Medicare Other

## 2014-02-10 DIAGNOSIS — I25810 Atherosclerosis of coronary artery bypass graft(s) without angina pectoris: Secondary | ICD-10-CM | POA: Diagnosis present

## 2014-02-10 DIAGNOSIS — I2510 Atherosclerotic heart disease of native coronary artery without angina pectoris: Secondary | ICD-10-CM

## 2014-02-10 DIAGNOSIS — J438 Other emphysema: Secondary | ICD-10-CM

## 2014-02-10 LAB — BASIC METABOLIC PANEL
ANION GAP: 10 (ref 5–15)
BUN: 19 mg/dL (ref 6–23)
CHLORIDE: 106 meq/L (ref 96–112)
CO2: 24 meq/L (ref 19–32)
Calcium: 8.4 mg/dL (ref 8.4–10.5)
Creatinine, Ser: 1.01 mg/dL (ref 0.50–1.10)
GFR calc Af Amer: 59 mL/min — ABNORMAL LOW (ref >90)
GFR calc non Af Amer: 51 mL/min — ABNORMAL LOW (ref >90)
Glucose, Bld: 134 mg/dL — ABNORMAL HIGH (ref 70–99)
POTASSIUM: 4 meq/L (ref 3.7–5.3)
SODIUM: 140 meq/L (ref 137–147)

## 2014-02-10 LAB — CBC
HCT: 35.4 % — ABNORMAL LOW (ref 36.0–46.0)
Hemoglobin: 11.5 g/dL — ABNORMAL LOW (ref 12.0–15.0)
MCH: 32.7 pg (ref 26.0–34.0)
MCHC: 32.5 g/dL (ref 30.0–36.0)
MCV: 100.6 fL — ABNORMAL HIGH (ref 78.0–100.0)
PLATELETS: 167 10*3/uL (ref 150–400)
RBC: 3.52 MIL/uL — AB (ref 3.87–5.11)
RDW: 15.3 % (ref 11.5–15.5)
WBC: 3.4 10*3/uL — AB (ref 4.0–10.5)

## 2014-02-10 MED ORDER — ISOSORBIDE MONONITRATE ER 30 MG PO TB24
30.0000 mg | ORAL_TABLET | Freq: Every day | ORAL | Status: DC
  Administered 2014-02-10 – 2014-02-11 (×2): 30 mg via ORAL
  Filled 2014-02-10 (×2): qty 1

## 2014-02-10 MED ORDER — BISACODYL 10 MG RE SUPP
10.0000 mg | Freq: Every day | RECTAL | Status: DC | PRN
  Administered 2014-02-10: 10 mg via RECTAL
  Filled 2014-02-10: qty 1

## 2014-02-10 MED ORDER — METOPROLOL SUCCINATE ER 25 MG PO TB24
12.5000 mg | ORAL_TABLET | Freq: Every day | ORAL | Status: DC
  Administered 2014-02-11: 12.5 mg via ORAL
  Filled 2014-02-10: qty 1

## 2014-02-10 MED ORDER — IOHEXOL 350 MG/ML SOLN
100.0000 mL | Freq: Once | INTRAVENOUS | Status: AC | PRN
  Administered 2014-02-10: 100 mL via INTRAVENOUS

## 2014-02-10 MED ORDER — LORATADINE 10 MG PO TABS
10.0000 mg | ORAL_TABLET | Freq: Every day | ORAL | Status: DC | PRN
  Administered 2014-02-10: 10 mg via ORAL
  Filled 2014-02-10: qty 1

## 2014-02-10 NOTE — Care Management Note (Signed)
    Page 1 of 1   02/10/2014     3:32:08 PM CARE MANAGEMENT NOTE 02/10/2014  Patient:  [name], [name]   Account Number:  [id number]  Date Initiated:  02/10/2014  Documentation initiated by:  GRAVES-BIGELOW,Kannon Baum  Subjective/Objective Assessment:   Pt admitted for cp. S/p cath. Pt is from home with daughter.     Action/Plan:   CM did speak with pt in regards to disposition needs and pt has no needs at this time.   Anticipated DC Date:  02/11/2014   Anticipated DC Plan:  [place]  CM consult      Choice offered to / List presented to:             Status of service:  Completed, signed off Medicare Important Message given?  YES (If response is "NO", the following Medicare IM given date fields will be blank) Date Medicare IM given:  02/10/2014 Medicare IM given by:  GRAVES-BIGELOW,Willa Brocks Date Additional Medicare IM given:   Additional Medicare IM given by:    Discharge Disposition:  HOME/SELF CARE  Per UR Regulation:  Reviewed for med. necessity/level of care/duration of stay  If discussed at [place] of Stay Meetings, dates discussed:    Comments:

## 2014-02-10 NOTE — Progress Notes (Signed)
Subjective: Chest tightness has returned was improved on IV NTG, will add IMDUR  Objective: Vital signs in last 24 hours: Temp:  [97.7 F (36.5 C)-98.2 F (36.8 C)] 97.7 F (36.5 C) (11/06 0451) Pulse Rate:  [41-72] 65 (11/06 0451) Resp:  [9-21] 21 (11/06 0451) BP: (113-166)/(40-76) 135/43 mmHg (11/06 0451) SpO2:  [91 %-100 %] 99 % (11/06 0958) Weight:  [150 lb (68.04 kg)] 150 lb (68.04 kg) (11/06 0451) Weight change: 2 lb (0.907 kg) Last BM Date: 02/08/14 Intake/Output from previous day: +480 11/05 0701 - 11/06 0700 In: 480 [P.O.:480] Out: -  Intake/Output this shift: Total I/O In: 240 [P.O.:240] Out: -   PE: General:Pleasant affect, NAD but chest pressure returned Skin:Warm and dry, brisk capillary refill HEENT:normocephalic, sclera clear, mucus membranes moist Neck:supple, no JVD, no bruits  Heart:S1S2 RRR without murmur, gallup, rub or click Lungs:diminished without rales, rhonchi, or wheezes JSE:GBTD, non tender, + BS, do not palpate liver spleen or masses Ext:no lower ext edema, 2+ pedal pulses, 2+ radial pulses, rt groin cath site with mild tenderness no hematoma Neuro:alert and oriented X 3, MAE, follows commands, + facial symmetry   Lab Results:  Recent Labs  02/09/14 0500 02/10/14 0318  WBC 4.0 3.4*  HGB 12.1 11.5*  HCT 36.1 35.4*  PLT 196 167   BMET  Recent Labs  02/08/14 1124 02/09/14 0500  NA 138 140  K 3.7 3.4*  CL 96 101  CO2 27 25  GLUCOSE 141* 108*  BUN 24* 27*  CREATININE 1.14* 1.15*  CALCIUM 10.0 9.2    Recent Labs  02/08/14 2317 02/09/14 0517  TROPONINI <0.30 <0.30    Lab Results  Component Value Date   CHOL 137 02/09/2014   HDL 27* 02/09/2014   LDLCALC 80 02/09/2014   TRIG 150* 02/09/2014   CHOLHDL 5.1 02/09/2014   Lab Results  Component Value Date   HGBA1C 5.9* 02/08/2014     Lab Results  Component Value Date   TSH 1.420 02/08/2014    Hepatic Function Panel No results for input(s): PROT,  ALBUMIN, AST, ALT, ALKPHOS, BILITOT, BILIDIR, IBILI in the last 72 hours.  Recent Labs  02/09/14 0500  CHOL 137   No results for input(s): PROTIME in the last 72 hours.     Studies/Results: Dg Chest 2 View  02/08/2014   CLINICAL DATA:  Several day history of chest pain; history of CABG and stent placement.  EXAM: CHEST  2 VIEW  COMPARISON:  Rib series of October 03, 2013. And PA and lateral chest x-ray of July 19, 2013  FINDINGS: The lungs are adequately inflated. There are coarse increased lung markings in the left retrocardiac region. The cardiac silhouette is mildly enlarged. The pulmonary vascularity is not engorged. There are 8 intact sternal wires present. The mediastinum is normal in width. There is no pleural effusion or pneumothorax. There is mild degenerative disc change at multiple thoracic levels.  IMPRESSION: Left lower lobe atelectasis or pneumonia. There is no evidence of CHF.   Electronically Signed   By: David  Martinique   On: 02/08/2014 12:46    Medications: I have reviewed the patient's current medications. Scheduled Meds: . aspirin EC  81 mg Oral Daily  . atorvastatin  20 mg Oral q1800  . ezetimibe  10 mg Oral QPM  . metoprolol tartrate  12.5 mg Oral BID  . omega-3 acid ethyl esters  1 g Oral BID  . pantoprazole  40 mg  Oral Daily  . temazepam  15-30 mg Oral QHS  . tiotropium  18 mcg Inhalation Daily   Continuous Infusions: . sodium chloride 10 mL/hr at 02/08/14 1900  . sodium chloride 125 mL/hr at 02/09/14 2242   PRN Meds:.acetaminophen, acetaminophen, nitroGLYCERIN, ondansetron (ZOFRAN) IV, ondansetron (ZOFRAN) IV, polyethylene glycol  Assessment/Plan: [name] is a 80 y.o. female has known coronary artery disease and is status post CABG revascularization surgery in 2005 by Dr. Tharon Aquas Tright at which time a LIMA graft was placed to the mid LAD and the saphenous vein graft was placed to the RCA. She presented to the hospital yesterday with chest pain  which she felt was identical to her pre-CABG discomfort. She has a history of COPD and bronchitis. In light of her worrisome symptoms with similar quality to her prior discomfort definitive cardiac catheterization was completed with atretic LIMA to LAD patent VG-dRCA and Total occlusion of the native RCA proximally prior to remotely placement RCA stent, with mild proximal 30% LAD stenosis and 30% distal circumflex stenoses.  EF 55%.  Also with dilated ascending aorta.  For CT angio.  Medical therapy for CAD.  Principal Problem:   Angina at rest with atretic LIMA and tot occl of RCA-patent VG-RCA, lopressor has been added this admit, though HR to 43 at times will change to toprol 12.5 daily.  Add Imdur   Active Problems:   Coronary atherosclerosis of native coronary artery with hx CABG   COPD (chronic obstructive pulmonary disease) use home O2   Hypertension stable   Chronic bronchitis   CAD of CABG LIMA -atretic    Hyperlipidemia- LDL 80, HDL 27 on lipitor and zetia   Dilated aorta-ascending- for CT angio if Renal stable.   LOS: 2 days   Time spent with pt. :15 minutes. [facility] R  Nurse Practitioner Certified Pager [phone] or after 5pm and on weekends call [phone] 02/10/2014, 10:36 AM

## 2014-02-10 NOTE — Plan of Care (Signed)
Problem: Phase III Progression Outcomes Goal: No anginal pain Outcome: Completed/Met Date Met:  02/10/14

## 2014-02-10 NOTE — Plan of Care (Signed)
Problem: Phase II Progression Outcomes Goal: Anginal pain relieved Outcome: Completed/Met Date Met:  02/10/14

## 2014-02-10 NOTE — Plan of Care (Signed)
Problem: Phase III Progression Outcomes Goal: Tolerating diet Outcome: Completed/Met Date Met:  02/10/14     

## 2014-02-10 NOTE — Plan of Care (Signed)
Problem: Phase II Progression Outcomes Goal: Cath/PCI Day Path if indicated Outcome: Completed/Met Date Met:  02/10/14

## 2014-02-11 LAB — CBC
HEMATOCRIT: 32.7 % — AB (ref 36.0–46.0)
HEMOGLOBIN: 10.7 g/dL — AB (ref 12.0–15.0)
MCH: 32.9 pg (ref 26.0–34.0)
MCHC: 32.7 g/dL (ref 30.0–36.0)
MCV: 100.6 fL — ABNORMAL HIGH (ref 78.0–100.0)
Platelets: 156 10*3/uL (ref 150–400)
RBC: 3.25 MIL/uL — ABNORMAL LOW (ref 3.87–5.11)
RDW: 15.2 % (ref 11.5–15.5)
WBC: 4.1 10*3/uL (ref 4.0–10.5)

## 2014-02-11 MED ORDER — LEVOFLOXACIN 500 MG PO TABS: 500.0000 mg | ORAL_TABLET | Freq: Every day | ORAL | Status: DC

## 2014-02-11 MED ORDER — ISOSORBIDE MONONITRATE ER 30 MG PO TB24: 30.0000 mg | ORAL_TABLET | Freq: Every day | ORAL | Status: DC

## 2014-02-11 MED ORDER — ACETAMINOPHEN 325 MG PO TABS: 650.0000 mg | ORAL_TABLET | ORAL | Status: DC | PRN

## 2014-02-11 MED ORDER — METOPROLOL SUCCINATE 12.5 MG HALF TABLET: 12.5000 mg | ORAL_TABLET | Freq: Every day | ORAL | Status: DC

## 2014-02-11 MED ORDER — LORATADINE 10 MG PO TABS: 10.0000 mg | ORAL_TABLET | Freq: Every day | ORAL | Status: DC | PRN

## 2014-02-11 MED ORDER — ASPIRIN 81 MG PO TBEC
81.0000 mg | DELAYED_RELEASE_TABLET | Freq: Every day | ORAL | Status: DC
Start: 2014-02-11 — End: 2014-03-09

## 2014-02-11 NOTE — Discharge Summary (Signed)
Patient ID: [name],  MRN: [id number], DOB/AGE: [birth year] 80 y.o.  Admit date: 02/08/2014 Discharge date: 02/11/2014  Primary Care Provider: [name], MD Primary Cardiologist: Dr Tamala Julian  Discharge Diagnoses Principal Problem:   Chest pain with moderate risk of acute coronary syndrome Active Problems:   CAD (coronary artery disease) of artery bypass graft, atretic LIMA   COPD (chronic obstructive pulmonary disease)   Hypertension   Pneumonia   Dyslipidemia   GERD (gastroesophageal reflux disease)    Procedures: Coronary angiogram 02/09/14   Hospital Course:  80 y.o. Female, followed by Dr Tamala Julian, with known coronary artery disease and is status post CABG revascularization surgery in 2005 by Dr. Tharon Aquas Tright at which time a LIMA graft was placed to the mid LAD and the saphenous vein graft was placed to the RCA. She presented to the hospital 02/08/14 with chest pain which she felt was identical to her pre-CABG discomfort. She has a history of COPD and bronchitis and is on O2 at home prn. In light of her worrisome symptoms with similar quality to her prior discomfort definitive cardiac catheterization was recommended. This was done 02/09/14 by Dr Claiborne Billings and revealed total occlusion of the native RCA proximally prior to remotely placement RCA stent, with mild proximal 30% LAD stenosis and 30% distal circumflex stenoses. She had a patent saphenous vein graft supplying the distal RCA, and an atretic LIMA to the LAD with a patent native LAD. EF was 55%. She was noted to have dilatation of the ascending aorta and a CTA was done which showed ectasia of the ascending thoracic aorta of 3.6 cm.  At discharge she reported that she was intolerant to statins. She also felt like she had "bronchitis" coming on. Her CXR on admission did show LLL atelectasis vs pneumonia and she will be discharged on antibiotics.    Discharge Vitals:  Blood pressure 116/55, pulse 53, temperature 97.7 F  (36.5 C), temperature source Oral, resp. rate 20, height 5\' 1"  (1.549 m), weight 147 lb 4.8 oz (66.815 kg), SpO2 94 %.    Labs: Results for orders placed or performed during the hospital encounter of 02/08/14 (from the past 24 hour(s))  Basic metabolic panel     Status: Abnormal   Collection Time: 02/10/14 11:20 AM  Result Value Ref Range   Sodium 140 137 - 147 mEq/L   Potassium 4.0 3.7 - 5.3 mEq/L   Chloride 106 96 - 112 mEq/L   CO2 24 19 - 32 mEq/L   Glucose, Bld 134 (H) 70 - 99 mg/dL   BUN 19 6 - 23 mg/dL   Creatinine, Ser 1.01 0.50 - 1.10 mg/dL   Calcium 8.4 8.4 - 10.5 mg/dL   GFR calc non Af Amer 51 (L) >90 mL/min   GFR calc Af Amer 59 (L) >90 mL/min   Anion gap 10 5 - 15  CBC     Status: Abnormal   Collection Time: 02/11/14  3:48 AM  Result Value Ref Range   WBC 4.1 4.0 - 10.5 K/uL   RBC 3.25 (L) 3.87 - 5.11 MIL/uL   Hemoglobin 10.7 (L) 12.0 - 15.0 g/dL   HCT 32.7 (L) 36.0 - 46.0 %   MCV 100.6 (H) 78.0 - 100.0 fL   MCH 32.9 26.0 - 34.0 pg   MCHC 32.7 30.0 - 36.0 g/dL   RDW 15.2 11.5 - 15.5 %   Platelets 156 150 - 400 K/uL    Disposition:  Follow-up Information  Follow up with Sinclair Grooms, MD.   Specialty:  Cardiology   Why:  office will call   Contact information:   [address] [place] [address] [phone]       Discharge Medications:    Medication List    TAKE these medications        acetaminophen 325 MG tablet  Commonly known as:  TYLENOL  Take 2 tablets (650 mg total) by mouth every 4 (four) hours as needed for headache or mild pain.     aspirin 81 MG EC tablet  Take 1 tablet (81 mg total) by mouth daily.     escitalopram 5 MG tablet  Commonly known as:  LEXAPRO  Take 5 mg by mouth daily.     ezetimibe 10 MG tablet  Commonly known as:  ZETIA  Take 10 mg by mouth every evening.     FISH OIL PO  Take 1 capsule by mouth 3 (three) times daily.     hydrochlorothiazide 25 MG tablet  Commonly known as:   HYDRODIURIL  Take 25 mg by mouth daily.     isosorbide mononitrate 30 MG 24 hr tablet  Commonly known as:  IMDUR  Take 1 tablet (30 mg total) by mouth daily.     levofloxacin 500 MG tablet  Commonly known as:  LEVAQUIN  Take 1 tablet (500 mg total) by mouth daily.     loratadine 10 MG tablet  Commonly known as:  CLARITIN  Take 1 tablet (10 mg total) by mouth daily as needed for allergies, rhinitis or itching.     Magnesium 250 MG Tabs  Take 250 mg by mouth daily.     metoprolol succinate 12.5 mg Tb24 24 hr tablet  Commonly known as:  TOPROL-XL  Take 0.5 tablets (12.5 mg total) by mouth daily.     naproxen sodium 220 MG tablet  Commonly known as:  ANAPROX  Take 440-660 mg by mouth daily as needed (pain).     NITROSTAT 0.4 MG SL tablet  Generic drug:  nitroGLYCERIN  Place 0.4 mg under the tongue every 5 (five) minutes as needed for chest pain.     pantoprazole 40 MG tablet  Commonly known as:  PROTONIX  Take 40 mg by mouth daily.     polyethylene glycol packet  Commonly known as:  MIRALAX / GLYCOLAX  Take 17 g by mouth daily as needed for mild constipation.     Potassium 99 MG Tabs  Take 99 mg by mouth daily.     temazepam 15 MG capsule  Commonly known as:  RESTORIL  Take 15-30 mg by mouth at bedtime. 15 mg to start, may take additional 15 mg if needed     tiotropium 18 MCG inhalation capsule  Commonly known as:  SPIRIVA  Place 18 mcg into inhaler and inhale daily as needed.     traMADol 50 MG tablet  Commonly known as:  ULTRAM  Take 50-100 mg by mouth every 6 (six) hours as needed. For pain         Duration of Discharge Encounter: Greater than 30 minutes including physician time.  Angelena Form PA-C 02/11/2014 10:02 AM

## 2014-02-11 NOTE — Discharge Instructions (Signed)
Abdominal Aortic Aneurysm ° Blood pumps away from the heart through tubes (blood vessels) called arteries. Aneurysms are weak or damaged places in the wall of an artery. It bulges out like a balloon. An abdominal aortic aneurysm happens in the main artery of the body (aorta). It can burst or tear, causing bleeding inside the body. This is an emergency. It needs treatment right away. °CAUSES  °The exact cause is unknown. Things that could cause this problem include: °· Fat and other substances building up in the lining of a tube. °· Swelling of the walls of a blood vessel. °· Certain tissue diseases. °· Belly (abdominal) trauma. °· An infection in the main artery of the body. °RISK FACTORS °There are things that make it more likely for you to have an aneurysm. These include: °· Being over the age of 78 years old. °· Having high blood pressure (hypertension). °· Being a female. °· Being white. °· Being very overweight (obese). °· Having a family history of aneurysm. °· Using tobacco products. °PREVENTION °To lessen your chance of getting this condition: °· Stop smoking. Stop chewing tobacco. °· Limit or avoid alcohol. °· Keep your blood pressure, blood sugar, and cholesterol within normal limits. °· Eat less salt. °· Eat foods low in saturated fats and cholesterol. These are found in animal and whole dairy products.  °· Eat more fiber. Fiber is found in whole grains, vegetables, and fruits. °· Keep a healthy weight. °· Stay active and exercise often. °SYMPTOMS °Symptoms depend on the size of the aneurysm and how fast it grows. There may not be symptoms. If symptoms occur, they can include: °· Pain (belly, side, lower back, or groin). °· Feeling full after eating a small amount of food. °· Feeling sick to your stomach (nauseous), throwing up (vomiting), or both. °· Feeling a lump in your belly that feels like it is beating (pulsating). °· Feeling like you will pass out (faint). °TREATMENT  °· Medicine to control blood  pressure and pain. °· Imaging tests to see if the aneurysm gets bigger. °· Surgery. °MAKE SURE YOU:  °· Understand these instructions. °· Will watch your condition. °· Will get help right away if you are not doing well or get worse. °Document Released: 07/19/2012 Document Reviewed: 07/19/2012 °[employer]® Patient Information ©2015 [employer]. This information is not intended to replace advice given to you by your health care provider. Make sure you discuss any questions you have with your health care provider. ° °

## 2014-02-11 NOTE — Progress Notes (Signed)
Pt discharged home with daughter.  Reviewed discharge instructions and education, all questions answered.  Assessment unchanged from earlier.  

## 2014-02-11 NOTE — Progress Notes (Signed)
Patient ID: [name], female   DOB: 11/13/1931, 80 y.o.   MRN: [id number]         Subjective: No chest pain wants to go home   Objective: Vital signs in last 24 hours: Temp:  [97.8 F (36.6 C)-98 F (36.7 C)] 97.8 F (36.6 C) (11/07 0359) Pulse Rate:  [47-54] 50 (11/07 0359) Resp:  [20] 20 (11/07 0359) BP: (135-149)/(47-71) 148/54 mmHg (11/07 0359) SpO2:  [94 %-99 %] 95 % (11/07 0808) Weight:  [66.815 kg (147 lb 4.8 oz)] 66.815 kg (147 lb 4.8 oz) (11/07 0359) Weight change: -1.225 kg (-2 lb 11.2 oz) Last BM Date: 02/08/14 Intake/Output from previous day: +480 11/06 0701 - 11/07 0700 In: 740 [P.O.:240; I.V.:500] Out: -  Intake/Output this shift:    PE: General:Pleasant affect, NAD but chest pressure returned Skin:Warm and dry, brisk capillary refill HEENT:normocephalic, sclera clear, mucus membranes moist Neck:supple, no JVD, no bruits  Heart:S1S2 RRR without murmur, gallup, rub or click Lungs:diminished without rales, rhonchi, or wheezes WCH:ENID, non tender, + BS, do not palpate liver spleen or masses Ext:no lower ext edema, 2+ pedal pulses, 2+ radial pulses, rt groin cath site with mild tenderness no hematoma Neuro:alert and oriented X 3, MAE, follows commands, + facial symmetry   Lab Results:  Recent Labs  02/10/14 0318 02/11/14 0348  WBC 3.4* 4.1  HGB 11.5* 10.7*  HCT 35.4* 32.7*  PLT 167 156   BMET  Recent Labs  02/09/14 0500 02/10/14 1120  NA 140 140  K 3.4* 4.0  CL 101 106  CO2 25 24  GLUCOSE 108* 134*  BUN 27* 19  CREATININE 1.15* 1.01  CALCIUM 9.2 8.4    Recent Labs  02/08/14 2317 02/09/14 0517  TROPONINI <0.30 <0.30    Lab Results  Component Value Date   CHOL 137 02/09/2014   HDL 27* 02/09/2014   LDLCALC 80 02/09/2014   TRIG 150* 02/09/2014   CHOLHDL 5.1 02/09/2014   Lab Results  Component Value Date   HGBA1C 5.9* 02/08/2014     Lab Results  Component Value Date   TSH 1.420 02/08/2014    Hepatic Function  Panel No results for input(s): PROT, ALBUMIN, AST, ALT, ALKPHOS, BILITOT, BILIDIR, IBILI in the last 72 hours.  Recent Labs  02/09/14 0500  CHOL 137   No results for input(s): PROTIME in the last 72 hours.     Studies/Results: Ct Angio Chest Aorta W/cm &/or Wo/cm  02/10/2014   CLINICAL DATA:  Dilated ascending aorta on cardiac catheterization, chest pressure x2 weeks  EXAM: CT ANGIOGRAPHY CHEST WITH CONTRAST  TECHNIQUE: Multidetector CT imaging of the chest was performed using the standard protocol during bolus administration of intravenous contrast. Multiplanar CT image reconstructions and MIPs were obtained to evaluate the vascular anatomy.  CONTRAST:  130mL OMNIPAQUE IOHEXOL 350 MG/ML SOLN  COMPARISON:  CTA chest dated 07/19/2013  FINDINGS: On unenhanced CT, there is no evidence of mediastinal hematoma. No evidence of thoracic aortic aneurysm or dissection. Ectasia of the ascending thoracic aorta, measuring up to 3.6 cm (series 5/ image 46).  Additional aortic measurements are as follows:  --2.7 cm at the level of the aortic root (series 5/image 25)  --2.9 cm the level of the proximal descending thoracic aorta (series 5/image 20)  --2.5 cm at the level of the aortic hiatus (series 5/image 98)  Although not tailored for evaluation of the pulmonary arteries, there is no evidence of pulmonary embolism.  Lungs are essentially clear. Mild subpleural  reticulation/ fibrosis at the right lung base. Mild right apical pleural parenchymal scarring. No suspicious pulmonary nodules. No pleural effusion or pneumothorax.  Visualized thyroid is unremarkable.  Cardiomegaly. No pericardial effusion. Coronary atherosclerosis. Postsurgical changes related to prior CABG.  Small mediastinal lymph nodes which do not meet pathologic CT size criteria. No suspicious hilar or axillary lymphadenopathy.  Visualized upper abdomen is notable for cholecystectomy clips, a small left upper pole renal cyst, and mild thickening of  the bilateral adrenal glands without discrete mass.  Degenerative changes of the visualized thoracolumbar spine. Probable benign bone island involving an upper thoracic vertebral body (series 9/image 78).  Review of the MIP images confirms the above findings.  IMPRESSION: No evidence of thoracic aortic aneurysm or dissection.  Ectasia of the ascending thoracic aorta, measuring up to 3.6 cm.   Electronically Signed   By: Julian Hy M.D.   On: 02/10/2014 16:27    Medications: I have reviewed the patient's current medications. Scheduled Meds: . aspirin EC  81 mg Oral Daily  . atorvastatin  20 mg Oral q1800  . ezetimibe  10 mg Oral QPM  . isosorbide mononitrate  30 mg Oral Daily  . metoprolol succinate  12.5 mg Oral Daily  . omega-3 acid ethyl esters  1 g Oral BID  . pantoprazole  40 mg Oral Daily  . temazepam  15-30 mg Oral QHS  . tiotropium  18 mcg Inhalation Daily   Continuous Infusions: . sodium chloride 10 mL/hr at 02/08/14 1900  . sodium chloride Stopped (02/10/14 1200)   PRN Meds:.acetaminophen, acetaminophen, bisacodyl, loratadine, nitroGLYCERIN, ondansetron (ZOFRAN) IV, ondansetron (ZOFRAN) IV, polyethylene glycol  Assessment/Plan: MARIEM SKOLNICK is a 80 y.o. female has known coronary artery disease and is status post CABG revascularization surgery in 2005 by Dr. Tharon Aquas Tright at which time a LIMA graft was placed to the mid LAD and the saphenous vein graft was placed to the RCA. She presented to the hospital yesterday with chest pain which she felt was identical to her pre-CABG discomfort. She has a history of COPD and bronchitis. In light of her worrisome symptoms with similar quality to her prior discomfort definitive cardiac catheterization was completed with atretic LIMA to LAD patent VG-dRCA and Total occlusion of the native RCA proximally prior to remotely placement RCA stent, with mild proximal 30% LAD stenosis and 30% distal circumflex stenoses.  EF 55%.  Also with  dilated ascending aorta.  For CT angio.  Medical therapy for CAD.  Principal Problem:   Angina at rest with atretic LIMA and tot occl of RCA-patent VG-RCA, lopressor has been added this admit, though HR to 43 at times will change to toprol 12.5 daily.  Imdur also added She is pain free and can be d/c for f/u Dr Claiborne Billings as outpatient.  CT with aortic ectasia no aneurysm    Chol:  Continue statin  Jenkins Rouge

## 2014-02-11 NOTE — Plan of Care (Signed)
Problem: Phase II Progression Outcomes Goal: Hemodynamically stable Outcome: Completed/Met Date Met:  02/11/14 Goal: Stress Test if indicated Outcome: Not Applicable Date Met:  62/83/15 Goal: CV Risk Factors identified Outcome: Completed/Met Date Met:  02/11/14 Goal: Cardiac Rehab if ordered Outcome: Not Applicable Date Met:  17/61/60

## 2014-02-16 ENCOUNTER — Ambulatory Visit: Payer: Medicare Other

## 2014-03-09 ENCOUNTER — Ambulatory Visit (INDEPENDENT_AMBULATORY_CARE_PROVIDER_SITE_OTHER): Payer: Medicare Other | Admitting: Physician Assistant

## 2014-03-09 ENCOUNTER — Encounter: Payer: Self-pay | Admitting: Physician Assistant

## 2014-03-09 VITALS — BP 140/70 | HR 72 | Ht 62.0 in | Wt 151.0 lb

## 2014-03-09 DIAGNOSIS — R5383 Other fatigue: Secondary | ICD-10-CM | POA: Insufficient documentation

## 2014-03-09 DIAGNOSIS — I25810 Atherosclerosis of coronary artery bypass graft(s) without angina pectoris: Secondary | ICD-10-CM

## 2014-03-09 DIAGNOSIS — E785 Hyperlipidemia, unspecified: Secondary | ICD-10-CM

## 2014-03-09 DIAGNOSIS — R5382 Chronic fatigue, unspecified: Secondary | ICD-10-CM

## 2014-03-09 LAB — CBC
HCT: 31.5 % — ABNORMAL LOW (ref 36.0–46.0)
Hemoglobin: 10.8 g/dL — ABNORMAL LOW (ref 12.0–15.0)
MCH: 33.2 pg (ref 26.0–34.0)
MCHC: 34.3 g/dL (ref 30.0–36.0)
MCV: 96.9 fL (ref 78.0–100.0)
MPV: 9.7 fL (ref 9.4–12.4)
Platelets: 202 10*3/uL (ref 150–400)
RBC: 3.25 MIL/uL — AB (ref 3.87–5.11)
RDW: 17.1 % — ABNORMAL HIGH (ref 11.5–15.5)
WBC: 4.4 10*3/uL (ref 4.0–10.5)

## 2014-03-09 NOTE — Assessment & Plan Note (Signed)
Blood pressures mildly elevated today however, the patient reports her blood pressures consistently under 125/75 at home. No changes to current therapy.

## 2014-03-09 NOTE — Assessment & Plan Note (Signed)
Patient reports She feels like she's "dragging" since being discharged from the hospital. Her conjunctiva look somewhat pale. We will check CBC to assess her hemoglobin level.

## 2014-03-09 NOTE — Assessment & Plan Note (Addendum)
CABG 2005, LIMA to LAD, SVG to RCA, cath 02/10/14- atretic LIMA with a patent LAD, patent [id number]-CFX.  No symptoms of angina in the last month.

## 2014-03-09 NOTE — Assessment & Plan Note (Addendum)
Last lipid panel showed an LDL of 80 triglycerides of 150. Patient does take fish oil and Zetia.

## 2014-03-09 NOTE — Patient Instructions (Signed)
Your physician recommends that you schedule a follow-up appointment in: one month with Dr. Tamala Julian  We are ordering blood work for you to get done today

## 2014-03-09 NOTE — Progress Notes (Signed)
Patient ID: [name], female   DOB: [birth year], 80 y.o.   MRN: [id number]    Date:  03/09/2014   ID:  [name], DOB Jan 29, 1932, MRN [id number]  PCP:  [name] Low, MD  Primary Cardiologist:  [name]     History of Present Illness: [name] is a 80 y.o. female, followed by Dr [name], with known coronary artery disease and is status post CABG revascularization surgery in 2005 by Dr. Tharon Aquas Tright at which time a LIMA graft was placed to the mid LAD and the saphenous vein graft was placed to the RCA. She presented to the hospital 02/08/14 with chest pain which she felt was identical to her pre-CABG discomfort. She has a history of COPD and bronchitis and is on O2 at home prn. In light of her worrisome symptoms with similar quality to her prior discomfort definitive cardiac catheterization was recommended. This was done 02/09/14 by Dr Claiborne Billings and revealed total occlusion of the native RCA proximally prior to remotely placement RCA stent, with mild proximal 30% LAD stenosis and 30% distal circumflex stenoses. She had a patent saphenous vein graft supplying the distal RCA, and an atretic LIMA to the LAD with a patent native LAD. EF was 55%. She was noted to have dilatation of the ascending aorta and a CTA was done which showed ectasia of the ascending thoracic aorta of 3.6 cm. At discharge she reported that she was intolerant to statins. She also felt like she had "bronchitis" coming on. Her CXR on admission did show LLL atelectasis vs pneumonia and she will be discharged on antibiotics.   Patient presents today for posthospital evaluation.  She  says she's been "dragging" since being discharged from the hospital.  She denies nausea, vomiting, fever, chest pain, shortness of breath, orthopnea, dizziness, PND, cough, congestion, abdominal pain, hematochezia, melena, lower extremity edema, claudication.  She says her right groin is fine without any problems.   Lipid Panel     Component  Value Date/Time   CHOL 137 02/09/2014 0500   TRIG 150* 02/09/2014 0500   HDL 27* 02/09/2014 0500   CHOLHDL 5.1 02/09/2014 0500   VLDL 30 02/09/2014 0500   LDLCALC 80 02/09/2014 0500      Wt Readings from Last 3 Encounters:  03/09/14 151 lb (68.493 kg)  02/11/14 147 lb 4.8 oz (66.815 kg)  01/26/13 138 lb (62.596 kg)     Past Medical History  Diagnosis Date  . COPD (chronic obstructive pulmonary disease)   . Gout   . Coronary atherosclerosis of native coronary artery 02/05/2012  . Hypertension   . High blood cholesterol   . Heart murmur     "all my life" (02/05/2012)  . Anginal pain   . Myocardial infarction 1990's; 2000's    "2 total, I think" (02/05/2012)  . Pneumonia     "several times" (02/05/2012)  . Chronic bronchitis   . Shortness of breath     "sometimes when I'm laying down; always when I do too much" (02/05/2012)  . History of blood transfusion   . External bleeding hemorrhoids     "act up at times" (02/05/2012)  . Chronic back pain     "all over" (02/05/2012)  . Depression   . CAD (coronary artery disease) of artery bypass graft 02/09/14    atretic LIMA    Current Outpatient Prescriptions  Medication Sig Dispense Refill  . aspirin 81 MG tablet Take 81 mg by mouth daily.    [name] escitalopram (  LEXAPRO) 5 MG tablet Take 5 mg by mouth daily.    [name] ezetimibe (ZETIA) 10 MG tablet Take 10 mg by mouth every evening.     . hydrochlorothiazide (HYDRODIURIL) 25 MG tablet Take 25 mg by mouth daily.    . isosorbide mononitrate (IMDUR) 30 MG 24 hr tablet Take 1 tablet (30 mg total) by mouth daily. 30 tablet 11  . loratadine (CLARITIN) 10 MG tablet Take 1 tablet (10 mg total) by mouth daily as needed for allergies, rhinitis or itching.    . Magnesium 250 MG TABS Take 250 mg by mouth daily.    . metoprolol succinate (TOPROL-XL) 12.5 mg TB24 24 hr tablet Take 0.5 tablets (12.5 mg total) by mouth daily. 30 tablet 5  . naproxen sodium (ANAPROX) 220 MG tablet Take 440-660 mg by  mouth daily as needed (pain).    [name] NITROSTAT 0.4 MG SL tablet Place 0.4 mg under the tongue every 5 (five) minutes as needed for chest pain.     . Omega-3 Fatty Acids (FISH OIL PO) Take 1 capsule by mouth 3 (three) times daily.    . pantoprazole (PROTONIX) 40 MG tablet Take 40 mg by mouth daily.    . polyethylene glycol (MIRALAX / GLYCOLAX) packet Take 17 g by mouth daily as needed for mild constipation.     . Potassium 99 MG TABS Take 99 mg by mouth daily.    [name] RA SENNA 8.6 MG tablet Take 1 tablet by mouth as needed.  0  . temazepam (RESTORIL) 15 MG capsule Take 15-30 mg by mouth at bedtime. 15 mg to start, may take additional 15 mg if needed    . tiotropium (SPIRIVA) 18 MCG inhalation capsule Place 18 mcg into inhaler and inhale daily as needed.    . traMADol (ULTRAM) 50 MG tablet Take 50-100 mg by mouth every 6 (six) hours as needed. For pain     No current facility-administered medications for this visit.    Allergies:    Allergies  Allergen Reactions  . Lexapro [Escitalopram Oxalate] Other (See Comments)    Fatigue   . Soma [Carisoprodol] Other (See Comments)    Fatigue   . Wellbutrin [Bupropion] Other (See Comments)    "couldn't function and take it"  . Albuterol Sulfate Other (See Comments)    Albuterol HFA inhaler caused nervousness   . Codeine Hives  . Plavix [Clopidogrel Bisulfate] Other (See Comments)    unknown    Social History:  The patient  reports that she quit smoking about 2 years ago. Her smoking use included Cigarettes. She has a 50 pack-year smoking history. She has never used smokeless tobacco. She reports that she drinks alcohol. She reports that she does not use illicit drugs.   Family history:   Family History  Problem Relation Age of Onset  . Anemia Neg Hx   . Arrhythmia Neg Hx   . Asthma Neg Hx   . Clotting disorder Neg Hx   . Fainting Neg Hx   . Heart attack Neg Hx   . Heart disease Neg Hx   . Heart failure Neg Hx   . Hyperlipidemia Neg Hx   .  Hypertension Neg Hx   . Aneurysm    . CVA    . CVA Mother   . Aneurysm Father     ROS:  Please see the history of present illness.  All other systems reviewed and negative.   PHYSICAL EXAM: VS:  BP 140/70 mmHg  Pulse  72  Ht 5\' 2"  (1.575 m)  Wt 151 lb (68.493 kg)  BMI 27.61 kg/m2 Well nourished, well developed, in no acute distress HEENT: Pupils are equal round react to light accommodation extraocular movements are intact.  Neck: no JVDNo cervical lymphadenopathy. Cardiac: Regular rate and rhythm without murmurs rubs or gallops. Lungs:  clear to auscultation bilaterally, no wheezing, rhonchi or rales Abd: soft, nontender, positive bowel sounds all quadrants, no hepatosplenomegaly Ext: no lower extremity edema.  2+ radial and dorsalis pedis pulses. Skin: warm and dry Neuro:  Grossly normal  None  ASSESSMENT AND PLAN:  Problem List Items Addressed This Visit    CAD (coronary artery disease) of artery bypass graft, atretic LIMA    CABG 2005, LIMA to LAD, SVG to RCA, cath 02/10/14- atretic LIMA with a patent LAD, patent SVG-CFX.  No symptoms of angina in the last month.      Relevant Medications      aspirin 81 MG tablet   Dyslipidemia (Chronic)    Last lipid panel showed an LDL of 80 triglycerides of 150. Patient does take fish oil and Zetia.    Fatigue    Patient reports She feels like she's "dragging" since being discharged from the hospital. Her conjunctiva look somewhat pale. We will check CBC to assess her hemoglobin level.    Relevant Orders      CBC    Other Visit Diagnoses    Chronic fatigue    -  Primary    Relevant Orders       CBC

## 2014-03-16 ENCOUNTER — Encounter (HOSPITAL_COMMUNITY): Payer: Self-pay | Admitting: Cardiovascular Disease

## 2014-04-18 ENCOUNTER — Encounter: Payer: Self-pay | Admitting: Interventional Cardiology

## 2014-04-18 ENCOUNTER — Ambulatory Visit (INDEPENDENT_AMBULATORY_CARE_PROVIDER_SITE_OTHER): Payer: Medicare Other | Admitting: Interventional Cardiology

## 2014-04-18 VITALS — BP 122/60 | HR 61 | Ht 62.0 in | Wt 148.0 lb

## 2014-04-18 DIAGNOSIS — E785 Hyperlipidemia, unspecified: Secondary | ICD-10-CM

## 2014-04-18 DIAGNOSIS — I10 Essential (primary) hypertension: Secondary | ICD-10-CM

## 2014-04-18 DIAGNOSIS — J438 Other emphysema: Secondary | ICD-10-CM

## 2014-04-18 DIAGNOSIS — I25810 Atherosclerosis of coronary artery bypass graft(s) without angina pectoris: Secondary | ICD-10-CM

## 2014-04-18 NOTE — Progress Notes (Signed)
Patient ID: [name], female   DOB: May 29, 1931, 81 y.o.   MRN: [id number]    [address], [address], [place]  [address] Phone: [phone] Fax:  [phone]  Date:  04/18/2014   ID:  [name], DOB [birth year], MRN [id number]  PCP:  [name], MD   ASSESSMENT:  1. Coronary artery disease with prior bypass grafting, clinically stable and with re-son catheterization documenting bypass graft patency 2. Essential hypertension, controlled 3. COPD, stable with recent pneumonia and now resolved   PLAN:  1. No change in current cardiac therapy 2. Maintain an active lifestyle 3. Clinical follow-up in one year   SUBJECTIVE: [name] is a 81 y.o. female who is doing well. She tells me she recently had pneumonia. She had a hospitalization at [facility] in November and underwent coronary angiography. She had atretic LIMA to LAD but widely patent native LAD. Saphenous vein graft to the right coronary was widely patent. Native right coronary was totally occluded. LV function was normal.  During the recent hospital stay had [facility], she had no cardiac issues. Appetite is improving.   Wt Readings from Last 3 Encounters:  04/18/14 148 lb (67.132 kg)  03/09/14 151 lb (68.493 kg)  02/11/14 147 lb 4.8 oz (66.815 kg)     Past Medical History  Diagnosis Date  . COPD (chronic obstructive pulmonary disease)   . Gout   . Coronary atherosclerosis of native coronary artery 02/05/2012  . Hypertension   . High blood cholesterol   . Heart murmur     "all my life" (02/05/2012)  . Anginal pain   . Myocardial infarction 1990's; 2000's    "2 total, I think" (02/05/2012)  . Pneumonia     "several times" (02/05/2012)  . Chronic bronchitis   . Shortness of breath     "sometimes when I'm laying down; always when I do too much" (02/05/2012)  . History of blood transfusion   . External bleeding hemorrhoids     "act up at times" (02/05/2012)  . Chronic  back pain     "all over" (02/05/2012)  . Depression   . CAD (coronary artery disease) of artery bypass graft 02/09/14    atretic LIMA    Current Outpatient Prescriptions  Medication Sig Dispense Refill  . aspirin 81 MG tablet Take 81 mg by mouth daily.    . cholecalciferol (VITAMIN D) 1000 UNITS tablet Take 1,000 Units by mouth daily.    [name] ezetimibe (ZETIA) 10 MG tablet Take 10 mg by mouth every evening.     . hydrochlorothiazide (HYDRODIURIL) 25 MG tablet Take 25 mg by mouth daily.    . isosorbide mononitrate (IMDUR) 30 MG 24 hr tablet Take 1 tablet (30 mg total) by mouth daily. 30 tablet 11  . loratadine (CLARITIN) 10 MG tablet Take 1 tablet (10 mg total) by mouth daily as needed for allergies, rhinitis or itching.    . metoprolol succinate (TOPROL-XL) 12.5 mg TB24 24 hr tablet Take 0.5 tablets (12.5 mg total) by mouth daily. 30 tablet 5  . NITROSTAT 0.4 MG SL tablet Place 0.4 mg under the tongue every 5 (five) minutes as needed for chest pain.     . Omega-3 Fatty Acids (FISH OIL PO) Take 1 capsule by mouth 3 (three) times daily.    [name] omeprazole (PRILOSEC) 40 MG capsule Take 40 mg by mouth daily.    . Potassium 99 MG TABS Take 99 mg by mouth daily.    [name]  potassium chloride SA (K-DUR,KLOR-CON) 20 MEQ tablet Take 20 mEq by mouth 2 (two) times daily.    . predniSONE (DELTASONE) 10 MG tablet Take 10 mg by mouth daily with breakfast.    . RA SENNA 8.6 MG tablet Take 1 tablet by mouth as needed.  0  . temazepam (RESTORIL) 15 MG capsule Take 15-30 mg by mouth at bedtime. 15 mg to start, may take additional 15 mg if needed    . tiotropium (SPIRIVA) 18 MCG inhalation capsule Place 18 mcg into inhaler and inhale daily as needed.    . traMADol (ULTRAM) 50 MG tablet Take 50-100 mg by mouth every 6 (six) hours as needed. For pain     No current facility-administered medications for this visit.    Allergies:    Allergies  Allergen Reactions  . Lexapro [Escitalopram Oxalate] Other (See Comments)     Fatigue   . Soma [Carisoprodol] Other (See Comments)    Fatigue   . Wellbutrin [Bupropion] Other (See Comments)    "couldn't function and take it"  . Albuterol Sulfate Other (See Comments)    Albuterol HFA inhaler caused nervousness   . Codeine Hives  . Plavix [Clopidogrel Bisulfate] Other (See Comments)    unknown    Social History:  The patient  reports that she quit smoking about 3 years ago. Her smoking use included Cigarettes. She has a 50 pack-year smoking history. She has never used smokeless tobacco. She reports that she drinks alcohol. She reports that she does not use illicit drugs.   ROS:  Please see the history of present illness.   Appetite is stable. No neurological complaints   All other systems reviewed and negative.   OBJECTIVE: VS:  BP 122/60 mmHg  Pulse 61  Ht 5\' 2"  (1.575 m)  Wt 148 lb (67.132 kg)  BMI 27.06 kg/m2 Well nourished, well developed, in no acute distress, pale appearing HEENT: normal Neck: JVD flat. Carotid bruit absent  Cardiac:  normal S1, S2; RRR; no murmur Lungs:  clear to auscultation bilaterally, no wheezing, rhonchi or rales Abd: soft, nontender, no hepatomegaly Ext: Edema absent. Pulses 2+ Skin: warm and dry Neuro:  CNs 2-12 intact, no focal abnormalities noted  EKG:  Not repeated       Signed, Illene Labrador III, MD 04/18/2014 8:17 AM

## 2014-04-18 NOTE — Patient Instructions (Signed)
Your physician recommends that you continue on your current medications as directed. Please refer to the Current Medication list given to you today.  Your physician wants you to follow-up in: 1 year with Dr.Smith You will receive a reminder letter in the mail two months in advance. If you don't receive a letter, please call our office to schedule the follow-up appointment.  

## 2014-05-20 ENCOUNTER — Encounter (HOSPITAL_COMMUNITY): Payer: Self-pay | Admitting: Emergency Medicine

## 2014-05-20 ENCOUNTER — Emergency Department (HOSPITAL_COMMUNITY): Payer: Medicare Other

## 2014-05-20 ENCOUNTER — Inpatient Hospital Stay (HOSPITAL_COMMUNITY)
Admission: EM | Admit: 2014-05-20 | Discharge: 2014-05-25 | DRG: 309 | Disposition: A | Discharge status: Skilled Nursing Facility | Payer: Medicare Other | Attending: Internal Medicine | Admitting: Internal Medicine

## 2014-05-20 DIAGNOSIS — I10 Essential (primary) hypertension: Secondary | ICD-10-CM | POA: Diagnosis present

## 2014-05-20 DIAGNOSIS — Z79899 Other long term (current) drug therapy: Secondary | ICD-10-CM | POA: Diagnosis not present

## 2014-05-20 DIAGNOSIS — Z7982 Long term (current) use of aspirin: Secondary | ICD-10-CM | POA: Diagnosis not present

## 2014-05-20 DIAGNOSIS — M109 Gout, unspecified: Secondary | ICD-10-CM | POA: Diagnosis present

## 2014-05-20 DIAGNOSIS — J438 Other emphysema: Secondary | ICD-10-CM

## 2014-05-20 DIAGNOSIS — J449 Chronic obstructive pulmonary disease, unspecified: Secondary | ICD-10-CM | POA: Diagnosis present

## 2014-05-20 DIAGNOSIS — R0789 Other chest pain: Secondary | ICD-10-CM | POA: Diagnosis present

## 2014-05-20 DIAGNOSIS — R001 Bradycardia, unspecified: Secondary | ICD-10-CM | POA: Diagnosis present

## 2014-05-20 DIAGNOSIS — R627 Adult failure to thrive: Secondary | ICD-10-CM | POA: Diagnosis present

## 2014-05-20 DIAGNOSIS — Q2733 Arteriovenous malformation of digestive system vessel: Secondary | ICD-10-CM

## 2014-05-20 DIAGNOSIS — Z6824 Body mass index (BMI) 24.0-24.9, adult: Secondary | ICD-10-CM | POA: Diagnosis not present

## 2014-05-20 DIAGNOSIS — F329 Major depressive disorder, single episode, unspecified: Secondary | ICD-10-CM | POA: Diagnosis present

## 2014-05-20 DIAGNOSIS — D649 Anemia, unspecified: Secondary | ICD-10-CM

## 2014-05-20 DIAGNOSIS — I25810 Atherosclerosis of coronary artery bypass graft(s) without angina pectoris: Secondary | ICD-10-CM | POA: Diagnosis present

## 2014-05-20 DIAGNOSIS — R42 Dizziness and giddiness: Secondary | ICD-10-CM | POA: Insufficient documentation

## 2014-05-20 DIAGNOSIS — Z955 Presence of coronary angioplasty implant and graft: Secondary | ICD-10-CM | POA: Diagnosis not present

## 2014-05-20 DIAGNOSIS — R1010 Upper abdominal pain, unspecified: Secondary | ICD-10-CM

## 2014-05-20 DIAGNOSIS — K219 Gastro-esophageal reflux disease without esophagitis: Secondary | ICD-10-CM | POA: Diagnosis present

## 2014-05-20 DIAGNOSIS — R079 Chest pain, unspecified: Secondary | ICD-10-CM | POA: Insufficient documentation

## 2014-05-20 DIAGNOSIS — E440 Moderate protein-calorie malnutrition: Secondary | ICD-10-CM | POA: Insufficient documentation

## 2014-05-20 DIAGNOSIS — Z87891 Personal history of nicotine dependence: Secondary | ICD-10-CM

## 2014-05-20 DIAGNOSIS — K5900 Constipation, unspecified: Secondary | ICD-10-CM | POA: Diagnosis present

## 2014-05-20 DIAGNOSIS — I2510 Atherosclerotic heart disease of native coronary artery without angina pectoris: Secondary | ICD-10-CM | POA: Diagnosis present

## 2014-05-20 DIAGNOSIS — G8929 Other chronic pain: Secondary | ICD-10-CM | POA: Diagnosis present

## 2014-05-20 DIAGNOSIS — R112 Nausea with vomiting, unspecified: Secondary | ICD-10-CM

## 2014-05-20 DIAGNOSIS — E876 Hypokalemia: Secondary | ICD-10-CM | POA: Diagnosis present

## 2014-05-20 DIAGNOSIS — M549 Dorsalgia, unspecified: Secondary | ICD-10-CM | POA: Diagnosis present

## 2014-05-20 DIAGNOSIS — I252 Old myocardial infarction: Secondary | ICD-10-CM

## 2014-05-20 DIAGNOSIS — E43 Unspecified severe protein-calorie malnutrition: Secondary | ICD-10-CM

## 2014-05-20 DIAGNOSIS — N179 Acute kidney failure, unspecified: Secondary | ICD-10-CM | POA: Diagnosis present

## 2014-05-20 DIAGNOSIS — I959 Hypotension, unspecified: Secondary | ICD-10-CM | POA: Diagnosis present

## 2014-05-20 LAB — URINALYSIS, ROUTINE W REFLEX MICROSCOPIC
Glucose, UA: NEGATIVE mg/dL
Hgb urine dipstick: NEGATIVE
Ketones, ur: 15 mg/dL — AB
NITRITE: NEGATIVE
PROTEIN: 30 mg/dL — AB
Specific Gravity, Urine: 1.029 (ref 1.005–1.030)
UROBILINOGEN UA: 1 mg/dL (ref 0.0–1.0)
pH: 5.5 (ref 5.0–8.0)

## 2014-05-20 LAB — COMPREHENSIVE METABOLIC PANEL
ALT: 13 U/L (ref 0–35)
AST: 29 U/L (ref 0–37)
Albumin: 3 g/dL — ABNORMAL LOW (ref 3.5–5.2)
Alkaline Phosphatase: 51 U/L (ref 39–117)
Anion gap: 10 (ref 5–15)
BUN: 16 mg/dL (ref 6–23)
CO2: 28 mmol/L (ref 19–32)
Calcium: 8.1 mg/dL — ABNORMAL LOW (ref 8.4–10.5)
Chloride: 96 mmol/L (ref 96–112)
Creatinine, Ser: 1.55 mg/dL — ABNORMAL HIGH (ref 0.50–1.10)
GFR calc Af Amer: 35 mL/min — ABNORMAL LOW (ref >90)
GFR, EST NON AFRICAN AMERICAN: 30 mL/min — AB (ref >90)
Glucose, Bld: 117 mg/dL — ABNORMAL HIGH (ref 70–99)
Potassium: 3.3 mmol/L — ABNORMAL LOW (ref 3.5–5.1)
Sodium: 134 mmol/L — ABNORMAL LOW (ref 135–145)
Total Bilirubin: 0.9 mg/dL (ref 0.3–1.2)
Total Protein: 8.5 g/dL — ABNORMAL HIGH (ref 6.0–8.3)

## 2014-05-20 LAB — PROTIME-INR
INR: 1.07 (ref 0.00–1.49)
Prothrombin Time: 14 seconds (ref 11.6–15.2)

## 2014-05-20 LAB — URINE MICROSCOPIC-ADD ON

## 2014-05-20 LAB — CBC
HCT: 26.8 % — ABNORMAL LOW (ref 36.0–46.0)
HEMOGLOBIN: 8.9 g/dL — AB (ref 12.0–15.0)
MCH: 32.7 pg (ref 26.0–34.0)
MCHC: 33.2 g/dL (ref 30.0–36.0)
MCV: 98.5 fL (ref 78.0–100.0)
PLATELETS: 221 10*3/uL (ref 150–400)
RBC: 2.72 MIL/uL — ABNORMAL LOW (ref 3.87–5.11)
RDW: 16.9 % — ABNORMAL HIGH (ref 11.5–15.5)
WBC: 4 10*3/uL (ref 4.0–10.5)

## 2014-05-20 LAB — LIPASE, BLOOD: Lipase: 27 U/L (ref 11–59)

## 2014-05-20 LAB — POC OCCULT BLOOD, ED: Fecal Occult Bld: NEGATIVE

## 2014-05-20 LAB — I-STAT TROPONIN, ED
TROPONIN I, POC: 0.03 ng/mL (ref 0.00–0.08)
Troponin i, poc: 0.03 ng/mL (ref 0.00–0.08)

## 2014-05-20 LAB — BRAIN NATRIURETIC PEPTIDE: B Natriuretic Peptide: 144.9 pg/mL — ABNORMAL HIGH (ref 0.0–100.0)

## 2014-05-20 MED ORDER — TRAMADOL HCL 50 MG PO TABS
100.0000 mg | ORAL_TABLET | Freq: Four times a day (QID) | ORAL | Status: DC | PRN
  Administered 2014-05-22 – 2014-05-25 (×2): 100 mg via ORAL
  Filled 2014-05-20 (×2): qty 2

## 2014-05-20 MED ORDER — SODIUM CHLORIDE 0.9 % IV SOLN
INTRAVENOUS | Status: DC
  Administered 2014-05-21: 50 mL/h via INTRAVENOUS

## 2014-05-20 MED ORDER — ISOSORBIDE MONONITRATE ER 30 MG PO TB24
30.0000 mg | ORAL_TABLET | Freq: Every day | ORAL | Status: DC
  Administered 2014-05-21: 30 mg via ORAL
  Filled 2014-05-20: qty 1

## 2014-05-20 MED ORDER — MORPHINE SULFATE 2 MG/ML IJ SOLN
2.0000 mg | Freq: Once | INTRAMUSCULAR | Status: AC
  Administered 2014-05-20: 2 mg via INTRAVENOUS
  Filled 2014-05-20: qty 1

## 2014-05-20 MED ORDER — SODIUM CHLORIDE 0.9 % IV BOLUS (SEPSIS)
500.0000 mL | Freq: Once | INTRAVENOUS | Status: AC
  Administered 2014-05-20: 500 mL via INTRAVENOUS

## 2014-05-20 MED ORDER — TRAZODONE HCL 100 MG PO TABS
100.0000 mg | ORAL_TABLET | Freq: Every day | ORAL | Status: DC
  Administered 2014-05-21 – 2014-05-22 (×3): 100 mg via ORAL
  Filled 2014-05-20 (×3): qty 1

## 2014-05-20 MED ORDER — PANTOPRAZOLE SODIUM 40 MG IV SOLR
40.0000 mg | Freq: Two times a day (BID) | INTRAVENOUS | Status: DC
  Administered 2014-05-21 (×2): 40 mg via INTRAVENOUS
  Filled 2014-05-20 (×2): qty 40

## 2014-05-20 MED ORDER — IOHEXOL 300 MG/ML  SOLN: 25.0000 mL | Freq: Once | INTRAMUSCULAR | Status: AC | PRN

## 2014-05-20 MED ORDER — MORPHINE SULFATE 2 MG/ML IJ SOLN
2.0000 mg | Freq: Once | INTRAMUSCULAR | Status: AC
Start: 2014-05-20 — End: 2014-05-20
  Administered 2014-05-20: 2 mg via INTRAVENOUS
  Filled 2014-05-20: qty 1

## 2014-05-20 MED ORDER — ASPIRIN EC 325 MG PO TBEC
325.0000 mg | DELAYED_RELEASE_TABLET | Freq: Every day | ORAL | Status: DC
  Administered 2014-05-21 – 2014-05-25 (×5): 325 mg via ORAL
  Filled 2014-05-20 (×5): qty 1

## 2014-05-20 MED ORDER — EZETIMIBE 10 MG PO TABS
10.0000 mg | ORAL_TABLET | Freq: Every day | ORAL | Status: DC
Start: 2014-05-20 — End: 2014-05-25
  Administered 2014-05-21 – 2014-05-24 (×5): 10 mg via ORAL
  Filled 2014-05-20 (×5): qty 1

## 2014-05-20 MED ORDER — ACETAMINOPHEN 325 MG PO TABS: 650.0000 mg | ORAL_TABLET | ORAL | Status: DC | PRN

## 2014-05-20 MED ORDER — HEPARIN SODIUM (PORCINE) 5000 UNIT/ML IJ SOLN
5000.0000 [IU] | Freq: Three times a day (TID) | INTRAMUSCULAR | Status: DC
  Administered 2014-05-21 – 2014-05-25 (×11): 5000 [IU] via SUBCUTANEOUS
  Filled 2014-05-20 (×11): qty 1

## 2014-05-20 MED ORDER — POTASSIUM CHLORIDE 20 MEQ/15ML (10%) PO SOLN
40.0000 meq | Freq: Once | ORAL | Status: AC
  Administered 2014-05-20: 40 meq via ORAL
  Filled 2014-05-20: qty 30

## 2014-05-20 MED ORDER — ONDANSETRON HCL 4 MG/2ML IJ SOLN
4.0000 mg | Freq: Four times a day (QID) | INTRAMUSCULAR | Status: DC | PRN
  Administered 2014-05-22 – 2014-05-25 (×4): 4 mg via INTRAVENOUS
  Filled 2014-05-20 (×4): qty 2

## 2014-05-20 MED ORDER — ALPRAZOLAM 0.25 MG PO TABS
0.2500 mg | ORAL_TABLET | Freq: Every day | ORAL | Status: DC | PRN
  Administered 2014-05-24: 0.25 mg via ORAL
  Filled 2014-05-20: qty 1

## 2014-05-20 MED ORDER — FENTANYL CITRATE 0.05 MG/ML IJ SOLN: 12.5000 ug | INTRAMUSCULAR | Status: DC | PRN

## 2014-05-20 MED ORDER — TIOTROPIUM BROMIDE MONOHYDRATE 18 MCG IN CAPS
18.0000 ug | ORAL_CAPSULE | Freq: Every day | RESPIRATORY_TRACT | Status: DC | PRN
Start: 2014-05-20 — End: 2014-05-25

## 2014-05-20 MED ORDER — IOHEXOL 300 MG/ML  SOLN
75.0000 mL | Freq: Once | INTRAMUSCULAR | Status: AC | PRN
  Administered 2014-05-20: 75 mL via INTRAVENOUS

## 2014-05-20 NOTE — ED Notes (Signed)
Pt c/o mid chest pain onset today with shortness of breath, N/V, dizziness and diaphoresis. Pt reports that she has been ill since January.

## 2014-05-20 NOTE — ED Notes (Signed)
Family at bedside. 

## 2014-05-20 NOTE — H&P (Addendum)
[facility] History and Physical  Patient: [name]  MRN: [id number]  DOB: May 16, 1931  DOS: the patient was seen and examined on 05/20/2014 PCP: [name], MD  Chief Complaint: Chest pain  HPI: [name] is a 81 y.o. female with Past medical history of COPD, recent treatment for pneumonia, coronary artery disease with CABG with recent cath, hypertension, dyslipidemia, GERD. The patient is presenting with complaints of chest pain and epigastric pain with nausea and vomiting. Patient mentions that she has been having upper abdominal pain with anorexia over last few weeks. Today while she was at home she started having complaints of recurrence of her upper abdominal pain this is associated with shortness of breath some dizziness and nausea and therefore she came to the hospital. The pain she described as upper abdominal epigastric and central sternal. She states it is a pressure-like sensation. She denies any worsening with breathing. She denies any worsening with movement. She denies any association with food or bowel movement. Does not have any recent change in her medication. Does not have any nausea or vomiting at the time of my evaluation. Has been taking regular stool softener and does not have any constipation. Has been passing gas. Denies any active blood. She was found to be hypoxic after receiving morphine in the ER for pain control.  The patient is coming from home. And at her baseline independent for most of her ADL.  Review of Systems: as mentioned in the history of present illness.  A Comprehensive review of the other systems is negative.  Past Medical History  Diagnosis Date  . COPD (chronic obstructive pulmonary disease)   . Gout   . Coronary atherosclerosis of native coronary artery 02/05/2012  . Hypertension   . High blood cholesterol   . Heart murmur     "all my life" (02/05/2012)  . Anginal pain   . Myocardial infarction 1990's; 2000's    "2  total, I think" (02/05/2012)  . Pneumonia     "several times" (02/05/2012)  . Chronic bronchitis   . Shortness of breath     "sometimes when I'm laying down; always when I do too much" (02/05/2012)  . History of blood transfusion   . External bleeding hemorrhoids     "act up at times" (02/05/2012)  . Chronic back pain     "all over" (02/05/2012)  . Depression   . CAD (coronary artery disease) of artery bypass graft 02/09/14    atretic LIMA   Past Surgical History  Procedure Laterality Date  . Appendectomy  ? 1970's  . Ectopic pregnancy surgery  1970's  . Abdominal hysterectomy  ? 1970's    "partial 1st time; complete 2nd" (02/05/2012)  . Cholecystectomy  ~ 2010  . Cataract extraction w/ intraocular lens  implant, bilateral  [phone]  . Coronary angioplasty with stent placement  2005  . Coronary artery bypass graft  2005    CABG X2  . Cardiac catheterization  02/09/14    atretic LIMA,  patent VG-dRCA and Total occlusion of the native RCA proximally prior to remotely placement RCA stent, with mild proximal 30% LAD stenosis and 30% distal circumflex stenoses.  EF 55%.  . Left heart catheterization with coronary/graft angiogram N/A 02/09/2014    Procedure: LEFT HEART CATHETERIZATION WITH Beatrix Fetters;  Surgeon: Troy Sine, MD;  Location: [facility] CATH LAB;  Service: Cardiovascular;  Laterality: N/A;   Social History:  reports that she quit smoking about 3 years ago. Her smoking use  included Cigarettes. She has a 50 pack-year smoking history. She has never used smokeless tobacco. She reports that she drinks alcohol. She reports that she does not use illicit drugs.  Allergies  Allergen Reactions  . Lexapro [Escitalopram Oxalate] Other (See Comments)    Fatigue   . Soma [Carisoprodol] Other (See Comments)    Fatigue   . Wellbutrin [Bupropion] Other (See Comments)    "couldn't function and take it"  . Albuterol Sulfate Other (See Comments)    Albuterol HFA inhaler caused  nervousness   . Codeine Hives  . Levaquin [Levofloxacin In D5w] Other (See Comments)    Unknown allergic reaction  . Plavix [Clopidogrel Bisulfate] Other (See Comments)    Unknown reaction    Family History  Problem Relation Age of Onset  . Anemia Neg Hx   . Arrhythmia Neg Hx   . Asthma Neg Hx   . Clotting disorder Neg Hx   . Fainting Neg Hx   . Heart attack Neg Hx   . Heart disease Neg Hx   . Heart failure Neg Hx   . Hyperlipidemia Neg Hx   . Hypertension Neg Hx   . Aneurysm    . CVA    . CVA Mother   . Aneurysm Father     Prior to Admission medications   Medication Sig Start Date End Date Taking? Authorizing Provider  ALPRAZolam Duanne Moron) 0.5 MG tablet Take 0.25 mg by mouth daily as needed for anxiety.  05/11/14  Yes Historical Provider, MD  aspirin 325 MG EC tablet Take 325 mg by mouth daily.   Yes Historical Provider, MD  cholecalciferol (VITAMIN D) 1000 UNITS tablet Take 1,000 Units by mouth daily.   Yes Historical Provider, MD  ezetimibe (ZETIA) 10 MG tablet Take 10 mg by mouth at bedtime.    Yes Historical Provider, MD  hydrochlorothiazide (HYDRODIURIL) 25 MG tablet Take 25 mg by mouth daily.   Yes Historical Provider, MD  IRON PO Take 1 tablet by mouth daily.   Yes Historical Provider, MD  isosorbide mononitrate (IMDUR) 30 MG 24 hr tablet Take 1 tablet (30 mg total) by mouth daily. 02/11/14  Yes Luke K Kilroy, PA-C  metoprolol succinate (TOPROL-XL) 25 MG 24 hr tablet Take 12.5 mg by mouth daily.   Yes Historical Provider, MD  nitroGLYCERIN (NITROSTAT) 0.4 MG SL tablet Place 0.4 mg under the tongue every 5 (five) minutes as needed for chest pain.   Yes Historical Provider, MD  Omega-3 Fatty Acids (FISH OIL) 1200 MG CAPS Take 1,200-2,400 mg by mouth 2 (two) times daily. Take 2 capsules (2400 mg) every morning and 1 capsule (1200 mg) every night   Yes Historical Provider, MD  ondansetron (ZOFRAN) 4 MG tablet Take 4 mg by mouth every 8 (eight) hours as needed for nausea or  vomiting.  05/16/14  Yes Historical Provider, MD  pantoprazole (PROTONIX) 40 MG tablet Take 40 mg by mouth daily. 05/16/14  Yes Historical Provider, MD  polyethylene glycol powder (GLYCOLAX/MIRALAX) powder Take 2 Containers by mouth at bedtime. Mix with 8 oz liquid and drink 05/16/14  Yes Historical Provider, MD  RA SENNA 8.6 MG tablet Take 1 tablet by mouth daily as needed for constipation.  02/07/14  Yes Historical Provider, MD  tiotropium (SPIRIVA) 18 MCG inhalation capsule Place 18 mcg into inhaler and inhale daily as needed (shortness of breath).    Yes Historical Provider, MD  traMADol (ULTRAM) 50 MG tablet Take 100 mg by mouth every 6 (six) hours  as needed (pain). For pain   Yes Historical Provider, MD  loratadine (CLARITIN) 10 MG tablet Take 1 tablet (10 mg total) by mouth daily as needed for allergies, rhinitis or itching. Patient not taking: Reported on 05/20/2014 02/11/14   Erlene Quan, PA-C  metoprolol succinate (TOPROL-XL) 12.5 mg TB24 24 hr tablet Take 0.5 tablets (12.5 mg total) by mouth daily. Patient not taking: Reported on 05/20/2014 02/11/14   Doreene Burke Kilroy, PA-C  NITROSTAT 0.4 MG SL tablet Place 0.4 mg under the tongue every 5 (five) minutes as needed for chest pain.  01/06/13   Historical Provider, MD  Potassium 99 MG TABS Take 99 mg by mouth daily.    Historical Provider, MD  potassium chloride SA (K-DUR,KLOR-CON) 20 MEQ tablet Take 20 mEq by mouth 2 (two) times daily.    Historical Provider, MD  sertraline (ZOLOFT) 25 MG tablet Take 25 mg by mouth daily. 04/19/14   Historical Provider, MD  traZODone (DESYREL) 100 MG tablet Take 100 mg by mouth at bedtime. 04/19/14   Historical Provider, MD    Physical Exam: Filed Vitals:   05/20/14 2130 05/20/14 2145 05/20/14 2300 05/20/14 2315  BP: 134/37 123/58 128/44 140/67  Pulse: 43 48 51 50  Temp:      TempSrc:      Resp: 15 13 12 16   Height:      Weight:      SpO2: 87% 85% 97% 99%    General: Alert, Awake and Oriented to Time, Place  and Person. Appear in mild distress Eyes: PERRL ENT: Oral Mucosa clear moist. Neck: no JVD Cardiovascular: S1 and S2 Present, no Murmur, Peripheral Pulses Present Respiratory: Bilateral Air entry equal and Decreased, Clear to Auscultation, noCrackles, no wheezes Abdomen: Bowel Sound presentoft and non tender Skin: no Rash Extremities: no Pedal edema, no calf tenderness Neurologic: Grossly no focal neuro deficit.  Labs on Admission:  CBC:  Recent Labs Lab 05/20/14 1710  WBC 4.0  HGB 8.9*  HCT 26.8*  MCV 98.5  PLT 221    CMP     Component Value Date/Time   NA 134* 05/20/2014 1711   K 3.3* 05/20/2014 1711   CL 96 05/20/2014 1711   CO2 28 05/20/2014 1711   GLUCOSE 117* 05/20/2014 1711   BUN 16 05/20/2014 1711   CREATININE 1.55* 05/20/2014 1711   CALCIUM 8.1* 05/20/2014 1711   PROT 8.5* 05/20/2014 1711   ALBUMIN 3.0* 05/20/2014 1711   AST 29 05/20/2014 1711   ALT 13 05/20/2014 1711   ALKPHOS 51 05/20/2014 1711   BILITOT 0.9 05/20/2014 1711   GFRNONAA 30* 05/20/2014 1711   GFRAA 35* 05/20/2014 1711     Recent Labs Lab 05/20/14 1711  LIPASE 27    No results for input(s): CKTOTAL, CKMB, CKMBINDEX, TROPONINI in the last 168 hours. BNP (last 3 results)  Recent Labs  05/20/14 1709  BNP 144.9*    ProBNP (last 3 results)  Recent Labs  07/19/13 1149 02/08/14 1727  PROBNP 299.3 121.3     Radiological Exams on Admission: Dg Chest 2 View (if Patient Has Fever And/or Copd)  05/20/2014   CLINICAL DATA:  Chest pain for 1 day.  Vomiting for 2 weeks  EXAM: CHEST  2 VIEW  COMPARISON:  May 11, 2014  FINDINGS: There is scarring in the lateral left base. There is no edema or consolidation. Heart is upper normal in size with pulmonary vascularity within normal limits. No pneumothorax. Patient is status post coronary artery bypass  grafting. There is atherosclerotic change in the aorta. Prominence in the superior mediastinum is stable and felt to represent great vessel  prominence in this age group. No bone lesions.  IMPRESSION: Mild scarring left base.  No edema or consolidation.   Electronically Signed   By: Lowella Grip III M.D.   On: 05/20/2014 17:36   Ct Chest W Contrast  05/20/2014   CLINICAL DATA:  81 year old female with chest and upper abdominal pain shortness of breath nausea vomiting. Initial encounter.  EXAM: CT CHEST, ABDOMEN, AND PELVIS WITH CONTRAST  TECHNIQUE: Multidetector CT imaging of the chest, abdomen and pelvis was performed following the standard protocol during bolus administration of intravenous contrast.  CONTRAST:  24mL OMNIPAQUE IOHEXOL 300 MG/ML  SOLN  COMPARISON:  [facility] Chest CT, CT Abdomen and Pelvis 04/09/2014.  FINDINGS: CT CHEST FINDINGS  Resolved airspace disease in the right lung since January. Stable lower lobe scarring or atelectasis greater on the left. Major airways are patent. No new pulmonary opacity.  Stable cardiomegaly. No pericardial or pleural effusion. Extensive atherosclerosis of the thoracic aorta. Coronary calcified plaque. Sequelae of CABG. No mediastinal lymphadenopathy. Negative thoracic inlet.  Scoliosis. Degenerative changes in the spine. No acute osseous abnormality identified.  CT ABDOMEN AND PELVIS FINDINGS  No acute osseous abnormality identified.  No pelvic free fluid.  Diminutive bladder.  Sigmoid diverticulosis with indistinct appearance of the sigmoid colon today, evidence of wall thickening. Mild mesenteric stranding. Sigmoid also is inseparable from the left vaginal fornix (sagittal image 61), and there is a small volume of gas in the vaginal vault.  Diverticulosis decrease is in the left colon which is decompressed and not definitely inflamed. Decompressed transverse colon. Oral contrast has reached the right colon. The cecum is located in the pelvis. No dilated small bowel. Negative stomach. Small hiatal hernia. Small duodenum diverticulum.  Hepatic steatosis. Surgically absent gallbladder. Portal  venous system within normal limits. Stable spleen, pancreas, adrenal glands and kidneys. Aortoiliac calcified atherosclerosis noted. Major arterial structures are patent. No abdominal free fluid. No lymphadenopathy.  IMPRESSION: 1. Resolved right lung pneumonia.  No new abnormality in the chest. 2. Sigmoid diverticulosis. No evidence of rupture or abscess. However, colovaginal fistulous possible. 3. Otherwise stable abdomen and pelvis.   Electronically Signed   By: Genevie Ann M.D.   On: 05/20/2014 21:33   US Abdomen Complete  05/20/2014   CLINICAL DATA:  81 year old female with upper abdominal pain with nausea. Prior cholecystectomy. Initial encounter.  EXAM: ULTRASOUND ABDOMEN COMPLETE  COMPARISON:  [facility] CT Abdomen and Pelvis 04/09/2014, and earlier  FINDINGS: Gallbladder: Surgically absent  Common bile duct: Diameter: 7 mm, stable and within normal limits in the post cholecystectomy state.  Liver: No focal lesion identified. Echogenicity at the upper limits of normal. No intrahepatic biliary ductal dilatation.  IVC: No abnormality visualized.  Pancreas: Visualized portion unremarkable.  Spleen: Size and appearance within normal limits.  Right Kidney: Length: 9.7 cm. Echogenicity within normal limits. No mass or hydronephrosis visualized.  Left Kidney: Length: 9.3 cm. Several small simple renal cysts again noted. Echogenicity within normal limits. No mass or hydronephrosis visualized.  Abdominal aorta: No aneurysm visualized.  Other findings: None.  IMPRESSION: Negative abdomen ultrasound status postcholecystectomy.   Electronically Signed   By: Genevie Ann M.D.   On: 05/20/2014 18:34   Ct Abdomen Pelvis W Contrast  05/20/2014   CLINICAL DATA:  81 year old female with chest and upper abdominal pain shortness of breath nausea vomiting. Initial encounter.  EXAM: CT CHEST, ABDOMEN, AND PELVIS WITH CONTRAST  TECHNIQUE: Multidetector CT imaging of the chest, abdomen and pelvis was performed following the  standard protocol during bolus administration of intravenous contrast.  CONTRAST:  30mL OMNIPAQUE IOHEXOL 300 MG/ML  SOLN  COMPARISON:  [facility] Chest CT, CT Abdomen and Pelvis 04/09/2014.  FINDINGS: CT CHEST FINDINGS  Resolved airspace disease in the right lung since January. Stable lower lobe scarring or atelectasis greater on the left. Major airways are patent. No new pulmonary opacity.  Stable cardiomegaly. No pericardial or pleural effusion. Extensive atherosclerosis of the thoracic aorta. Coronary calcified plaque. Sequelae of CABG. No mediastinal lymphadenopathy. Negative thoracic inlet.  Scoliosis. Degenerative changes in the spine. No acute osseous abnormality identified.  CT ABDOMEN AND PELVIS FINDINGS  No acute osseous abnormality identified.  No pelvic free fluid.  Diminutive bladder.  Sigmoid diverticulosis with indistinct appearance of the sigmoid colon today, evidence of wall thickening. Mild mesenteric stranding. Sigmoid also is inseparable from the left vaginal fornix (sagittal image 61), and there is a small volume of gas in the vaginal vault.  Diverticulosis decrease is in the left colon which is decompressed and not definitely inflamed. Decompressed transverse colon. Oral contrast has reached the right colon. The cecum is located in the pelvis. No dilated small bowel. Negative stomach. Small hiatal hernia. Small duodenum diverticulum.  Hepatic steatosis. Surgically absent gallbladder. Portal venous system within normal limits. Stable spleen, pancreas, adrenal glands and kidneys. Aortoiliac calcified atherosclerosis noted. Major arterial structures are patent. No abdominal free fluid. No lymphadenopathy.  IMPRESSION: 1. Resolved right lung pneumonia.  No new abnormality in the chest. 2. Sigmoid diverticulosis. No evidence of rupture or abscess. However, colovaginal fistulous possible. 3. Otherwise stable abdomen and pelvis.   Electronically Signed   By: Genevie Ann M.D.   On: 05/20/2014 21:33     EKG: Independently reviewed. normal sinus rhythm, nonspecific ST and T waves changes, initial EKG did not have any T-wave inversions repeat EKG has T wave inversions. But EKG unchanged from EKG on November 2015.[name]  Assessment/Plan Principal Problem:   Chest pressure Active Problems:   GERD (gastroesophageal reflux disease)   COPD (chronic obstructive pulmonary disease)   Hypertension   CAD (coronary artery disease) of artery bypass graft, atretic LIMA   1. Chest pressure  the patient is presenting with complaints of chest pain. She has EKG changes but no significant new EKG changes as compared to 2015 November. She had a coronary angiography which was not showing any significant stenosis amenable to intervention. She was recommended to continue medical management. She will be admitted in the hospital we will check stat troponin. Monitor serial troponin. Echogram in the morning. She will remain nothing by mouth except medication.  2. GERD, nausea vomiting. We will attempt to use IV Protonix to control her gastritis. Patient remains nothing by mouth at present. Zofran as needed.  3. Hypertension. Sinus bradycardia   In view of patient's hypotensive episodes holding metoprolol but continuing Imdur. Can resume metoprolol once her blood pressure is more stable.  4. Constipation. Continuing Miralex.  Advance goals of care discussion: Full code   DVT Prophylaxis: subcutaneous Heparin Nutrition: Nothing by mouth except medication  Disposition: Admitted to observation in stepdown  unit.  Author: Berle Mull, MD [facility] Pager: [phone] 05/20/2014, 11:43 PM    If 7PM-7AM, please contact night-coverage [email or web address] Password [email or web address]

## 2014-05-20 NOTE — ED Notes (Signed)
Patient transported to X-ray 

## 2014-05-20 NOTE — ED Notes (Signed)
Pt. Not in room at this time.

## 2014-05-20 NOTE — ED Notes (Signed)
Patient transported back to E-48 from CT.

## 2014-05-20 NOTE — ED Notes (Signed)
Patient transported to CT 

## 2014-05-20 NOTE — ED Provider Notes (Signed)
CSN: [id number]     Arrival date & time 05/20/14  1636 History   First MD Initiated Contact with Patient 05/20/14 1707     Chief Complaint  Patient presents with  . Chest Pain     (Consider location/radiation/quality/duration/timing/severity/associated sxs/prior Treatment) HPI 81 y.o. Female complaining of upper abdominal pain for a month worse for 2-3 days with worsening this am with nausea and vomiting.  Describes emesis as food and clear to yellow,denies blood.  She states she has lost 10 pounds over the past month.  Seh was originally seen with similar symptoms and had d ct of the abdomen which she reports as normal.  She has had cholecystectomy and has had hysterectomy.  She denies diarrhea or constipation, and no dark stools or bloody stools.  Past Medical History  Diagnosis Date  . COPD (chronic obstructive pulmonary disease)   . Gout   . Coronary atherosclerosis of native coronary artery 02/05/2012  . Hypertension   . High blood cholesterol   . Heart murmur     "all my life" (02/05/2012)  . Anginal pain   . Myocardial infarction 1990's; 2000's    "2 total, I think" (02/05/2012)  . Pneumonia     "several times" (02/05/2012)  . Chronic bronchitis   . Shortness of breath     "sometimes when I'm laying down; always when I do too much" (02/05/2012)  . History of blood transfusion   . External bleeding hemorrhoids     "act up at times" (02/05/2012)  . Chronic back pain     "all over" (02/05/2012)  . Depression   . CAD (coronary artery disease) of artery bypass graft 02/09/14    atretic LIMA   Past Surgical History  Procedure Laterality Date  . Appendectomy  ? 1970's  . Ectopic pregnancy surgery  1970's  . Abdominal hysterectomy  ? 1970's    "partial 1st time; complete 2nd" (02/05/2012)  . Cholecystectomy  ~ 2010  . Cataract extraction w/ intraocular lens  implant, bilateral  [phone]  . Coronary angioplasty with stent placement  2005  . Coronary artery bypass  graft  2005    CABG X2  . Cardiac catheterization  02/09/14    atretic LIMA,  patent VG-dRCA and Total occlusion of the native RCA proximally prior to remotely placement RCA stent, with mild proximal 30% LAD stenosis and 30% distal circumflex stenoses.  EF 55%.  . Left heart catheterization with coronary/graft angiogram N/A 02/09/2014    Procedure: LEFT HEART CATHETERIZATION WITH Beatrix Fetters;  Surgeon: Troy Sine, MD;  Location: [facility] CATH LAB;  Service: Cardiovascular;  Laterality: N/A;   Family History  Problem Relation Age of Onset  . Anemia Neg Hx   . Arrhythmia Neg Hx   . Asthma Neg Hx   . Clotting disorder Neg Hx   . Fainting Neg Hx   . Heart attack Neg Hx   . Heart disease Neg Hx   . Heart failure Neg Hx   . Hyperlipidemia Neg Hx   . Hypertension Neg Hx   . Aneurysm    . CVA    . CVA Mother   . Aneurysm Father    History  Substance Use Topics  . Smoking status: Former Smoker -- 1.00 packs/day for 50 years    Types: Cigarettes    Quit date: 04/02/2011  . Smokeless tobacco: Never Used  . Alcohol Use: Yes     Comment: 02/05/2012 "have a drink of wine ~ q 6 months"  OB History    No data available     Review of Systems  All other systems reviewed and are negative.     Allergies  Lexapro; Soma; Wellbutrin; Albuterol sulfate; Codeine; Levaquin; and Plavix  Home Medications   Prior to Admission medications   Medication Sig Start Date End Date Taking? Authorizing Provider  ALPRAZolam Duanne Moron) 0.5 MG tablet Take 0.25 mg by mouth daily as needed for anxiety.  05/11/14  Yes Historical Provider, MD  aspirin 325 MG EC tablet Take 325 mg by mouth daily.   Yes Historical Provider, MD  cholecalciferol (VITAMIN D) 1000 UNITS tablet Take 1,000 Units by mouth daily.   Yes Historical Provider, MD  ezetimibe (ZETIA) 10 MG tablet Take 10 mg by mouth at bedtime.    Yes Historical Provider, MD  hydrochlorothiazide (HYDRODIURIL) 25 MG tablet Take 25 mg by mouth daily.    Yes Historical Provider, MD  IRON PO Take 1 tablet by mouth daily.   Yes Historical Provider, MD  isosorbide mononitrate (IMDUR) 30 MG 24 hr tablet Take 1 tablet (30 mg total) by mouth daily. 02/11/14  Yes Luke K Kilroy, PA-C  metoprolol succinate (TOPROL-XL) 25 MG 24 hr tablet Take 12.5 mg by mouth daily.   Yes Historical Provider, MD  nitroGLYCERIN (NITROSTAT) 0.4 MG SL tablet Place 0.4 mg under the tongue every 5 (five) minutes as needed for chest pain.   Yes Historical Provider, MD  Omega-3 Fatty Acids (FISH OIL) 1200 MG CAPS Take 1,200-2,400 mg by mouth 2 (two) times daily. Take 2 capsules (2400 mg) every morning and 1 capsule (1200 mg) every night   Yes Historical Provider, MD  ondansetron (ZOFRAN) 4 MG tablet Take 4 mg by mouth every 8 (eight) hours as needed for nausea or vomiting.  05/16/14  Yes Historical Provider, MD  pantoprazole (PROTONIX) 40 MG tablet Take 40 mg by mouth daily. 05/16/14  Yes Historical Provider, MD  polyethylene glycol powder (GLYCOLAX/MIRALAX) powder Take 2 Containers by mouth at bedtime. Mix with 8 oz liquid and drink 05/16/14  Yes Historical Provider, MD  RA SENNA 8.6 MG tablet Take 1 tablet by mouth daily as needed for constipation.  02/07/14  Yes Historical Provider, MD  tiotropium (SPIRIVA) 18 MCG inhalation capsule Place 18 mcg into inhaler and inhale daily as needed (shortness of breath).    Yes Historical Provider, MD  traMADol (ULTRAM) 50 MG tablet Take 100 mg by mouth every 6 (six) hours as needed (pain). For pain   Yes Historical Provider, MD  loratadine (CLARITIN) 10 MG tablet Take 1 tablet (10 mg total) by mouth daily as needed for allergies, rhinitis or itching. Patient not taking: Reported on 05/20/2014 02/11/14   Erlene Quan, PA-C  metoprolol succinate (TOPROL-XL) 12.5 mg TB24 24 hr tablet Take 0.5 tablets (12.5 mg total) by mouth daily. Patient not taking: Reported on 05/20/2014 02/11/14   Doreene Burke Kilroy, PA-C  NITROSTAT 0.4 MG SL tablet Place 0.4 mg under the  tongue every 5 (five) minutes as needed for chest pain.  01/06/13   Historical Provider, MD  Potassium 99 MG TABS Take 99 mg by mouth daily.    Historical Provider, MD  potassium chloride SA (K-DUR,KLOR-CON) 20 MEQ tablet Take 20 mEq by mouth 2 (two) times daily.    Historical Provider, MD  sertraline (ZOLOFT) 25 MG tablet Take 25 mg by mouth daily. 04/19/14   Historical Provider, MD  traZODone (DESYREL) 100 MG tablet Take 100 mg by mouth at bedtime. 04/19/14  Historical Provider, MD   BP 138/47 mmHg  Pulse 49  Temp(Src) 97.4 F (36.3 C) (Oral)  Resp 14  Ht 5\' 2"  (1.575 m)  Wt 137 lb (62.143 kg)  BMI 25.05 kg/m2  SpO2 92% Physical Exam  Constitutional: She is oriented to person, place, and time. She appears well-developed and well-nourished.  HENT:  Head: Normocephalic and atraumatic.  Right Ear: External ear normal.  Left Ear: External ear normal.  Nose: Nose normal.  Mouth/Throat: Oropharynx is clear and moist.  Eyes: Conjunctivae and EOM are normal. Pupils are equal, round, and reactive to light.  Neck: Normal range of motion. Neck supple.  Cardiovascular: Normal rate, regular rhythm, normal heart sounds and intact distal pulses.   Pulmonary/Chest: Effort normal and breath sounds normal.  Abdominal: Soft. Bowel sounds are normal. There is tenderness.    Musculoskeletal: Normal range of motion.  Neurological: She is alert and oriented to person, place, and time. She has normal reflexes.  Skin: Skin is warm and dry.  Psychiatric: She has a normal mood and affect. Her behavior is normal. Judgment and thought content normal.  Nursing note and vitals reviewed.   ED Course  Procedures (including critical care time) Labs Review Labs Reviewed  CBC - Abnormal; Notable for the following:    RBC 2.72 (*)    Hemoglobin 8.9 (*)    HCT 26.8 (*)    RDW 16.9 (*)    All other components within normal limits  BRAIN NATRIURETIC PEPTIDE - Abnormal; Notable for the following:    B  Natriuretic Peptide 144.9 (*)    All other components within normal limits  COMPREHENSIVE METABOLIC PANEL - Abnormal; Notable for the following:    Sodium 134 (*)    Potassium 3.3 (*)    Glucose, Bld 117 (*)    Creatinine, Ser 1.55 (*)    Calcium 8.1 (*)    Total Protein 8.5 (*)    Albumin 3.0 (*)    GFR calc non Af Amer 30 (*)    GFR calc Af Amer 35 (*)    All other components within normal limits  LIPASE, BLOOD  PROTIME-INR  OCCULT BLOOD X 1 CARD TO LAB, STOOL  URINALYSIS, ROUTINE W REFLEX MICROSCOPIC  I-STAT TROPOININ, ED  POC OCCULT BLOOD, ED    Imaging Review Dg Chest 2 View (if Patient Has Fever And/or Copd)  05/20/2014   CLINICAL DATA:  Chest pain for 1 day.  Vomiting for 2 weeks  EXAM: CHEST  2 VIEW  COMPARISON:  May 11, 2014  FINDINGS: There is scarring in the lateral left base. There is no edema or consolidation. Heart is upper normal in size with pulmonary vascularity within normal limits. No pneumothorax. Patient is status post coronary artery bypass grafting. There is atherosclerotic change in the aorta. Prominence in the superior mediastinum is stable and felt to represent great vessel prominence in this age group. No bone lesions.  IMPRESSION: Mild scarring left base.  No edema or consolidation.   Electronically Signed   By: Lowella Grip III M.D.   On: 05/20/2014 17:36   US Abdomen Complete  05/20/2014   CLINICAL DATA:  81 year old female with upper abdominal pain with nausea. Prior cholecystectomy. Initial encounter.  EXAM: ULTRASOUND ABDOMEN COMPLETE  COMPARISON:  [facility] CT Abdomen and Pelvis 04/09/2014, and earlier  FINDINGS: Gallbladder: Surgically absent  Common bile duct: Diameter: 7 mm, stable and within normal limits in the post cholecystectomy state.  Liver: No focal lesion identified. Echogenicity  at the upper limits of normal. No intrahepatic biliary ductal dilatation.  IVC: No abnormality visualized.  Pancreas: Visualized portion unremarkable.   Spleen: Size and appearance within normal limits.  Right Kidney: Length: 9.7 cm. Echogenicity within normal limits. No mass or hydronephrosis visualized.  Left Kidney: Length: 9.3 cm. Several small simple renal cysts again noted. Echogenicity within normal limits. No mass or hydronephrosis visualized.  Abdominal aorta: No aneurysm visualized.  Other findings: None.  IMPRESSION: Negative abdomen ultrasound status postcholecystectomy.   Electronically Signed   By: Genevie Ann M.D.   On: 05/20/2014 18:34     EKG Interpretation   Date/Time:  Saturday May 20 2014 16:40:48 EST Ventricular Rate:  68 PR Interval:  174 QRS Duration: 82 QT Interval:  376 QTC Calculation: 399 R Axis:   118 Text Interpretation:  Normal sinus rhythm Right axis deviation Cannot rule  out Anterior infarct , age undetermined Abnormal ECG Poor data quality T  wave inversion has resolved since first prior tracing of 02/10/14 Confirmed  by Zacariah Belue MD, Andee Poles [phone]) on 05/20/2014 5:06:37 PM      EKG Interpretation  Date/Time:  Saturday May 20 2014 18:36:00 EST Ventricular Rate:  67 PR Interval:  220 QRS Duration: 90 QT Interval:  542 QTC Calculation: 572 R Axis:   119 Text Interpretation:  Sinus or ectopic atrial rhythm Atrial premature complex Prolonged PR interval Right axis deviation Abnormal T, consider ischemia, lateral leads Prolonged QT interval Confirmed by Yunique Dearcos MD, Andee Poles ([id number]) on 05/20/2014 8:39:42 PM       MDM   Final diagnoses:  Upper abdominal pain     1-upper abdominal pain/nausea/vomiting- patient ttp with weight loss.  Query gastritis- protonix given here.  2- chest pain?- patient with repeat ekg with lateral t wave changes.  Repeat troponin pending  Troponin negative, pain minimal after repeat ms.  3- anemia- hemocult negative- patient with downward trending hemoglobin - protonix ordered for possible upper gi source.  Patient will need further evaluation. November hgb 13 with gradual  downward trend 4- renal isufficiency.  Creatinine increased to 1.55 with prior 1.01 02/10/14 5- mild hypokalemia- oral repletion ordered  Discussed with Dr.Patel and plan admission to step down.   Shaune Pollack, MD 05/20/14 2239

## 2014-05-20 NOTE — ED Notes (Signed)
[name] - [phone]

## 2014-05-20 NOTE — ED Notes (Signed)
Pt back from ct

## 2014-05-21 DIAGNOSIS — E43 Unspecified severe protein-calorie malnutrition: Secondary | ICD-10-CM

## 2014-05-21 DIAGNOSIS — D649 Anemia, unspecified: Secondary | ICD-10-CM

## 2014-05-21 DIAGNOSIS — R112 Nausea with vomiting, unspecified: Secondary | ICD-10-CM

## 2014-05-21 DIAGNOSIS — R072 Precordial pain: Secondary | ICD-10-CM

## 2014-05-21 LAB — IRON AND TIBC
IRON: 56 ug/dL (ref 42–145)
Saturation Ratios: 29 % (ref 20–55)
TIBC: 190 ug/dL — ABNORMAL LOW (ref 250–470)
UIBC: 134 ug/dL (ref 125–400)

## 2014-05-21 LAB — CK TOTAL AND CKMB (NOT AT ARMC)
CK TOTAL: 78 U/L (ref 7–177)
CK, MB: 0.8 ng/mL (ref 0.3–4.0)
Relative Index: INVALID (ref 0.0–2.5)

## 2014-05-21 LAB — LACTIC ACID, PLASMA: Lactic Acid, Venous: 1.3 mmol/L (ref 0.5–2.0)

## 2014-05-21 LAB — LACTATE DEHYDROGENASE: LDH: 167 U/L (ref 94–250)

## 2014-05-21 LAB — COMPREHENSIVE METABOLIC PANEL
ALT: 11 U/L (ref 0–35)
ANION GAP: 9 (ref 5–15)
AST: 22 U/L (ref 0–37)
Albumin: 2.4 g/dL — ABNORMAL LOW (ref 3.5–5.2)
Alkaline Phosphatase: 42 U/L (ref 39–117)
BILIRUBIN TOTAL: 0.7 mg/dL (ref 0.3–1.2)
BUN: 14 mg/dL (ref 6–23)
CO2: 26 mmol/L (ref 19–32)
CREATININE: 1.39 mg/dL — AB (ref 0.50–1.10)
Calcium: 8.1 mg/dL — ABNORMAL LOW (ref 8.4–10.5)
Chloride: 99 mmol/L (ref 96–112)
GFR calc non Af Amer: 35 mL/min — ABNORMAL LOW (ref >90)
GFR, EST AFRICAN AMERICAN: 40 mL/min — AB (ref >90)
GLUCOSE: 92 mg/dL (ref 70–99)
Potassium: 3.4 mmol/L — ABNORMAL LOW (ref 3.5–5.1)
Sodium: 134 mmol/L — ABNORMAL LOW (ref 135–145)
TOTAL PROTEIN: 7.1 g/dL (ref 6.0–8.3)

## 2014-05-21 LAB — CBC WITH DIFFERENTIAL/PLATELET
BASOS ABS: 0 10*3/uL (ref 0.0–0.1)
Basophils Relative: 0 % (ref 0–1)
EOS PCT: 0 % (ref 0–5)
Eosinophils Absolute: 0 10*3/uL (ref 0.0–0.7)
HCT: 23.6 % — ABNORMAL LOW (ref 36.0–46.0)
Hemoglobin: 7.6 g/dL — ABNORMAL LOW (ref 12.0–15.0)
LYMPHS ABS: 0.9 10*3/uL (ref 0.7–4.0)
Lymphocytes Relative: 32 % (ref 12–46)
MCH: 32.2 pg (ref 26.0–34.0)
MCHC: 32.2 g/dL (ref 30.0–36.0)
MCV: 100 fL (ref 78.0–100.0)
MONOS PCT: 12 % (ref 3–12)
Monocytes Absolute: 0.3 10*3/uL (ref 0.1–1.0)
Neutro Abs: 1.6 10*3/uL — ABNORMAL LOW (ref 1.7–7.7)
Neutrophils Relative %: 56 % (ref 43–77)
Platelets: 171 10*3/uL (ref 150–400)
RBC: 2.36 MIL/uL — AB (ref 3.87–5.11)
RDW: 17.2 % — AB (ref 11.5–15.5)
WBC: 2.9 10*3/uL — AB (ref 4.0–10.5)

## 2014-05-21 LAB — ABO/RH: ABO/RH(D): A NEG

## 2014-05-21 LAB — RETICULOCYTES
RBC.: 2.37 MIL/uL — AB (ref 3.87–5.11)
Retic Count, Absolute: 56.9 10*3/uL (ref 19.0–186.0)
Retic Ct Pct: 2.4 % (ref 0.4–3.1)

## 2014-05-21 LAB — VITAMIN B12: VITAMIN B 12: 1452 pg/mL — AB (ref 211–911)

## 2014-05-21 LAB — MRSA PCR SCREENING: MRSA BY PCR: NEGATIVE

## 2014-05-21 LAB — FERRITIN: Ferritin: 566 ng/mL — ABNORMAL HIGH (ref 10–291)

## 2014-05-21 LAB — PROTIME-INR
INR: 1.18 (ref 0.00–1.49)
PROTHROMBIN TIME: 15.2 s (ref 11.6–15.2)

## 2014-05-21 LAB — TROPONIN I: Troponin I: 0.03 ng/mL (ref <0.031)

## 2014-05-21 LAB — FOLATE: Folate: >20 ng/mL

## 2014-05-21 LAB — PREPARE RBC (CROSSMATCH)

## 2014-05-21 LAB — HEPATITIS C ANTIBODY: HCV Ab: NEGATIVE

## 2014-05-21 LAB — TSH: TSH: 2.439 u[IU]/mL (ref 0.350–4.500)

## 2014-05-21 MED ORDER — ENSURE COMPLETE PO LIQD
237.0000 mL | Freq: Two times a day (BID) | ORAL | Status: DC
Start: 2014-05-21 — End: 2014-05-25
  Administered 2014-05-25: 237 mL via ORAL

## 2014-05-21 MED ORDER — SODIUM CHLORIDE 0.9 % IV SOLN
Freq: Once | INTRAVENOUS | Status: AC
  Administered 2014-05-21: 16:00:00 via INTRAVENOUS

## 2014-05-21 MED ORDER — POLYETHYLENE GLYCOL 3350 17 G PO PACK
17.0000 g | PACK | Freq: Every day | ORAL | Status: DC
Start: 2014-05-21 — End: 2014-05-25
  Administered 2014-05-21 – 2014-05-25 (×2): 17 g via ORAL
  Filled 2014-05-21 (×3): qty 1

## 2014-05-21 MED ORDER — PANTOPRAZOLE SODIUM 40 MG PO TBEC
40.0000 mg | DELAYED_RELEASE_TABLET | Freq: Two times a day (BID) | ORAL | Status: DC
  Administered 2014-05-21 – 2014-05-25 (×8): 40 mg via ORAL
  Filled 2014-05-21 (×8): qty 1

## 2014-05-21 MED ORDER — ISOSORBIDE MONONITRATE ER 60 MG PO TB24
60.0000 mg | ORAL_TABLET | Freq: Every day | ORAL | Status: DC
  Administered 2014-05-22 – 2014-05-25 (×3): 60 mg via ORAL
  Filled 2014-05-21 (×4): qty 1

## 2014-05-21 NOTE — Consult Note (Signed)
[facility] Consultation Note  Referring Provider:  Dr. Janece Canterbury [facility]) Primary Care Physician:  [name], MD  Reason for Consultation:  Nausea, vomiting, weight loss.  HPI: [name] is a 81 y.o. female whom we've been asked to see for the above reasons.  She has had several months of intermittent post-prandial nausea and vomiting, which over the past couple weeks, has significantly worsened.  She denies dysphagia, but will have vomiting very quickly after eating solid foods, once she feels like it has reached the stomach.  She is able to tolerate liquids.  Vomiting is now after most solid foods.  She has lost about 10 lbs over the past couple weeks.  No hematemesis, hematochezia.  Has "pressure" discomfort in periumbilical and epigastric region.  Takes NSAIDs on an occasional basis.  No prior endoscopy or colonoscopy.  Recent CT chest abdomen pelvis done yesterday, results as below.   Past Medical History  Diagnosis Date  . COPD (chronic obstructive pulmonary disease)   . Gout   . Coronary atherosclerosis of native coronary artery 02/05/2012  . Hypertension   . High blood cholesterol   . Heart murmur     "all my life" (02/05/2012)  . Anginal pain   . Myocardial infarction 1990's; 2000's    "2 total, I think" (02/05/2012)  . Pneumonia     "several times" (02/05/2012)  . Chronic bronchitis   . Shortness of breath     "sometimes when I'm laying down; always when I do too much" (02/05/2012)  . History of blood transfusion   . External bleeding hemorrhoids     "act up at times" (02/05/2012)  . Chronic back pain     "all over" (02/05/2012)  . Depression   . CAD (coronary artery disease) of artery bypass graft 02/09/14    atretic LIMA    Past Surgical History  Procedure Laterality Date  . Appendectomy  ? 1970's  . Ectopic pregnancy surgery  1970's  . Abdominal hysterectomy  ? 1970's    "partial 1st time; complete 2nd" (02/05/2012)  . Cholecystectomy   ~ 2010  . Cataract extraction w/ intraocular lens  implant, bilateral  [phone]  . Coronary angioplasty with stent placement  2005  . Coronary artery bypass graft  2005    CABG X2  . Cardiac catheterization  02/09/14    atretic LIMA,  patent VG-dRCA and Total occlusion of the native RCA proximally prior to remotely placement RCA stent, with mild proximal 30% LAD stenosis and 30% distal circumflex stenoses.  EF 55%.  . Left heart catheterization with coronary/graft angiogram N/A 02/09/2014    Procedure: LEFT HEART CATHETERIZATION WITH Beatrix Fetters;  Surgeon: Troy Sine, MD;  Location: [facility] CATH LAB;  Service: Cardiovascular;  Laterality: N/A;    Prior to Admission medications   Medication Sig Start Date End Date Taking? Authorizing Provider  ALPRAZolam Duanne Moron) 0.5 MG tablet Take 0.25 mg by mouth daily as needed for anxiety.  05/11/14  Yes Historical Provider, MD  aspirin 325 MG EC tablet Take 325 mg by mouth daily.   Yes Historical Provider, MD  cholecalciferol (VITAMIN D) 1000 UNITS tablet Take 1,000 Units by mouth daily.   Yes Historical Provider, MD  ezetimibe (ZETIA) 10 MG tablet Take 10 mg by mouth at bedtime.    Yes Historical Provider, MD  hydrochlorothiazide (HYDRODIURIL) 25 MG tablet Take 25 mg by mouth daily.   Yes Historical Provider, MD  IRON PO Take 1 tablet by mouth daily.  Yes Historical Provider, MD  isosorbide mononitrate (IMDUR) 30 MG 24 hr tablet Take 1 tablet (30 mg total) by mouth daily. 02/11/14  Yes Luke K Kilroy, PA-C  metoprolol succinate (TOPROL-XL) 25 MG 24 hr tablet Take 12.5 mg by mouth daily.   Yes Historical Provider, MD  nitroGLYCERIN (NITROSTAT) 0.4 MG SL tablet Place 0.4 mg under the tongue every 5 (five) minutes as needed for chest pain.   Yes Historical Provider, MD  Omega-3 Fatty Acids (FISH OIL) 1200 MG CAPS Take 1,200-2,400 mg by mouth 2 (two) times daily. Take 2 capsules (2400 mg) every morning and 1 capsule (1200 mg) every night   Yes  Historical Provider, MD  ondansetron (ZOFRAN) 4 MG tablet Take 4 mg by mouth every 8 (eight) hours as needed for nausea or vomiting.  05/16/14  Yes Historical Provider, MD  pantoprazole (PROTONIX) 40 MG tablet Take 40 mg by mouth daily. 05/16/14  Yes Historical Provider, MD  polyethylene glycol powder (GLYCOLAX/MIRALAX) powder Take 2 Containers by mouth at bedtime. Mix with 8 oz liquid and drink 05/16/14  Yes Historical Provider, MD  RA SENNA 8.6 MG tablet Take 1 tablet by mouth daily as needed for constipation.  02/07/14  Yes Historical Provider, MD  tiotropium (SPIRIVA) 18 MCG inhalation capsule Place 18 mcg into inhaler and inhale daily as needed (shortness of breath).    Yes Historical Provider, MD  traMADol (ULTRAM) 50 MG tablet Take 100 mg by mouth every 6 (six) hours as needed (pain). For pain   Yes Historical Provider, MD  loratadine (CLARITIN) 10 MG tablet Take 1 tablet (10 mg total) by mouth daily as needed for allergies, rhinitis or itching. Patient not taking: Reported on 05/20/2014 02/11/14   Erlene Quan, PA-C  metoprolol succinate (TOPROL-XL) 12.5 mg TB24 24 hr tablet Take 0.5 tablets (12.5 mg total) by mouth daily. Patient not taking: Reported on 05/20/2014 02/11/14   Doreene Burke Kilroy, PA-C  NITROSTAT 0.4 MG SL tablet Place 0.4 mg under the tongue every 5 (five) minutes as needed for chest pain.  01/06/13   Historical Provider, MD  Potassium 99 MG TABS Take 99 mg by mouth daily.    Historical Provider, MD  potassium chloride SA (K-DUR,KLOR-CON) 20 MEQ tablet Take 20 mEq by mouth 2 (two) times daily.    Historical Provider, MD  sertraline (ZOLOFT) 25 MG tablet Take 25 mg by mouth daily. 04/19/14   Historical Provider, MD  traZODone (DESYREL) 100 MG tablet Take 100 mg by mouth at bedtime. 04/19/14   Historical Provider, MD    Current Facility-Administered Medications  Medication Dose Route Frequency Provider Last Rate Last Dose  . 0.9 %  sodium chloride infusion   Intravenous Continuous Berle Mull, MD 50 mL/hr at 05/21/14 0049 50 mL/hr at 05/21/14 0049  . 0.9 %  sodium chloride infusion   Intravenous Once Janece Canterbury, MD      . acetaminophen (TYLENOL) tablet 650 mg  650 mg Oral Q4H PRN Berle Mull, MD      . ALPRAZolam Duanne Moron) tablet 0.25 mg  0.25 mg Oral Daily PRN Berle Mull, MD      . aspirin EC tablet 325 mg  325 mg Oral Daily Berle Mull, MD   325 mg at 05/21/14 1014  . ezetimibe (ZETIA) tablet 10 mg  10 mg Oral QHS Berle Mull, MD   10 mg at 05/21/14 0048  . feeding supplement (ENSURE COMPLETE) (ENSURE COMPLETE) liquid 237 mL  237 mL Oral BID BM Noah Delaine  Short, MD      . fentaNYL (SUBLIMAZE) injection 12.5-25 mcg  12.5-25 mcg Intravenous Q4H PRN Berle Mull, MD      . heparin injection 5,000 Units  5,000 Units Subcutaneous 3 times per day Berle Mull, MD   5,000 Units at 05/21/14 0609  . [START ON 05/22/2014] isosorbide mononitrate (IMDUR) 24 hr tablet 60 mg  60 mg Oral Daily Janece Canterbury, MD      . ondansetron [facility]) injection 4 mg  4 mg Intravenous Q6H PRN Berle Mull, MD      . pantoprazole (PROTONIX) EC tablet 40 mg  40 mg Oral BID AC Janece Canterbury, MD      . polyethylene glycol (MIRALAX / GLYCOLAX) packet 17 g  17 g Oral Daily Berle Mull, MD   17 g at 05/21/14 1015  . tiotropium (SPIRIVA) inhalation capsule 18 mcg  18 mcg Inhalation Daily PRN Berle Mull, MD      . traMADol Veatrice Bourbon) tablet 100 mg  100 mg Oral Q6H PRN Berle Mull, MD      . traZODone (DESYREL) tablet 100 mg  100 mg Oral QHS Berle Mull, MD   100 mg at 05/21/14 0048    Allergies as of 05/20/2014 - Review Complete 05/20/2014  Allergen Reaction Noted  . Lexapro [escitalopram oxalate] Other (See Comments) 02/05/2012  . Soma [carisoprodol] Other (See Comments) 02/05/2012  . Wellbutrin [bupropion] Other (See Comments) 02/05/2012  . Albuterol sulfate Other (See Comments) 02/05/2012  . Codeine Hives 02/05/2012  . Levaquin [levofloxacin in d5w] Other (See Comments) 05/20/2014  . Plavix  [clopidogrel bisulfate] Other (See Comments) 02/05/2012    Family History  Problem Relation Age of Onset  . Anemia Neg Hx   . Arrhythmia Neg Hx   . Asthma Neg Hx   . Clotting disorder Neg Hx   . Fainting Neg Hx   . Heart attack Neg Hx   . Heart disease Neg Hx   . Heart failure Neg Hx   . Hyperlipidemia Neg Hx   . Hypertension Neg Hx   . Aneurysm    . CVA    . CVA Mother   . Aneurysm Father     History   Social History  . Marital Status: Widowed    Spouse Name: N/A  . Number of Children: N/A  . Years of Education: N/A   Occupational History  . Not on file.   Social History Main Topics  . Smoking status: Former Smoker -- 1.00 packs/day for 50 years    Types: Cigarettes    Quit date: 04/02/2011  . Smokeless tobacco: Never Used  . Alcohol Use: Yes     Comment: 02/05/2012 "have a drink of wine ~ q 6 months"  . Drug Use: No  . Sexual Activity: Not Currently   Other Topics Concern  . Not on file   Social History Narrative    Review of Systems: As per HPI, all others negative  Physical Exam: Vital signs in last 24 hours: Temp:  [97.4 F (36.3 C)-98.8 F (37.1 C)] 98.8 F (37.1 C) (02/14 1321) Pulse Rate:  [42-216] 57 (02/14 1321) Resp:  [12-23] 18 (02/14 1321) BP: (100-148)/(37-99) 102/49 mmHg (02/14 1321) SpO2:  [85 %-99 %] 86 % (02/14 1321) Weight:  [61.553 kg (135 lb 11.2 oz)-62.143 kg (137 lb)] 61.553 kg (135 lb 11.2 oz) (02/14 0000) Last BM Date: 05/19/14 General:   Alert,  Chronically ill-appearing, but is in no acute distress Head:  Normocephalic and atraumatic. Eyes:  Sclera clear, no icterus.   Conjunctiva pale Ears:  Normal auditory acuity. Nose:  No deformity, discharge,  or lesions. Mouth:  No deformity or lesions.  Oropharynx pale and somewhat dry Neck:  Supple; no masses or thyromegaly. Lungs:  Clear throughout to auscultation.   No wheezes, crackles, or rhonchi. No acute distress. Heart:  Regular rate and rhythm; IV/VI SEM LUSB, no clicks,  rubs,  or gallops. Abdomen:  Soft, nontender but mild distended. No masses, hepatosplenomegaly or hernias noted. Normal bowel sounds, without guarding, and without rebound.     Msk:  Symmetrical without gross deformities. Normal posture. Pulses:  Normal pulses noted. Extremities:  Without clubbing or edema. Neurologic:  Alert and  oriented x4;  grossly normal neurologically. Skin:  Pale-appearing, but otherwise intact without significant lesions or rashes. Psych:  Alert and cooperative. Normal mood and affect.  Lab Results:  Recent Labs  05/20/14 1710 05/21/14 0920  WBC 4.0 2.9*  HGB 8.9* 7.6*  HCT 26.8* 23.6*  PLT 221 171   BMET  Recent Labs  05/20/14 1711 05/21/14 0920  NA 134* 134*  K 3.3* 3.4*  CL 96 99  CO2 28 26  GLUCOSE 117* 92  BUN 16 14  CREATININE 1.55* 1.39*  CALCIUM 8.1* 8.1*   LFT  Recent Labs  05/21/14 0920  PROT 7.1  ALBUMIN 2.4*  AST 22  ALT 11  ALKPHOS 42  BILITOT 0.7   PT/INR  Recent Labs  05/20/14 1711 05/21/14 0920  LABPROT 14.0 15.2  INR 1.07 1.18    Studies/Results: Dg Chest 2 View (if Patient Has Fever And/or Copd)  05/20/2014   CLINICAL DATA:  Chest pain for 1 day.  Vomiting for 2 weeks  EXAM: CHEST  2 VIEW  COMPARISON:  May 11, 2014  FINDINGS: There is scarring in the lateral left base. There is no edema or consolidation. Heart is upper normal in size with pulmonary vascularity within normal limits. No pneumothorax. Patient is status post coronary artery bypass grafting. There is atherosclerotic change in the aorta. Prominence in the superior mediastinum is stable and felt to represent great vessel prominence in this age group. No bone lesions.  IMPRESSION: Mild scarring left base.  No edema or consolidation.   Electronically Signed   By: Lowella Grip III M.D.   On: 05/20/2014 17:36   Ct Chest W Contrast  05/20/2014   CLINICAL DATA:  81 year old female with chest and upper abdominal pain shortness of breath nausea  vomiting. Initial encounter.  EXAM: CT CHEST, ABDOMEN, AND PELVIS WITH CONTRAST  TECHNIQUE: Multidetector CT imaging of the chest, abdomen and pelvis was performed following the standard protocol during bolus administration of intravenous contrast.  CONTRAST:  36mL OMNIPAQUE IOHEXOL 300 MG/ML  SOLN  COMPARISON:  [facility] Chest CT, CT Abdomen and Pelvis 04/09/2014.  FINDINGS: CT CHEST FINDINGS  Resolved airspace disease in the right lung since January. Stable lower lobe scarring or atelectasis greater on the left. Major airways are patent. No new pulmonary opacity.  Stable cardiomegaly. No pericardial or pleural effusion. Extensive atherosclerosis of the thoracic aorta. Coronary calcified plaque. Sequelae of CABG. No mediastinal lymphadenopathy. Negative thoracic inlet.  Scoliosis. Degenerative changes in the spine. No acute osseous abnormality identified.  CT ABDOMEN AND PELVIS FINDINGS  No acute osseous abnormality identified.  No pelvic free fluid.  Diminutive bladder.  Sigmoid diverticulosis with indistinct appearance of the sigmoid colon today, evidence of wall thickening. Mild mesenteric stranding. Sigmoid also is inseparable from the left vaginal  fornix (sagittal image 61), and there is a small volume of gas in the vaginal vault.  Diverticulosis decrease is in the left colon which is decompressed and not definitely inflamed. Decompressed transverse colon. Oral contrast has reached the right colon. The cecum is located in the pelvis. No dilated small bowel. Negative stomach. Small hiatal hernia. Small duodenum diverticulum.  Hepatic steatosis. Surgically absent gallbladder. Portal venous system within normal limits. Stable spleen, pancreas, adrenal glands and kidneys. Aortoiliac calcified atherosclerosis noted. Major arterial structures are patent. No abdominal free fluid. No lymphadenopathy.  IMPRESSION: 1. Resolved right lung pneumonia.  No new abnormality in the chest. 2. Sigmoid diverticulosis. No  evidence of rupture or abscess. However, colovaginal fistulous possible. 3. Otherwise stable abdomen and pelvis.   Electronically Signed   By: Genevie Ann M.D.   On: 05/20/2014 21:33   US Abdomen Complete  05/20/2014   CLINICAL DATA:  81 year old female with upper abdominal pain with nausea. Prior cholecystectomy. Initial encounter.  EXAM: ULTRASOUND ABDOMEN COMPLETE  COMPARISON:  [facility] CT Abdomen and Pelvis 04/09/2014, and earlier  FINDINGS: Gallbladder: Surgically absent  Common bile duct: Diameter: 7 mm, stable and within normal limits in the post cholecystectomy state.  Liver: No focal lesion identified. Echogenicity at the upper limits of normal. No intrahepatic biliary ductal dilatation.  IVC: No abnormality visualized.  Pancreas: Visualized portion unremarkable.  Spleen: Size and appearance within normal limits.  Right Kidney: Length: 9.7 cm. Echogenicity within normal limits. No mass or hydronephrosis visualized.  Left Kidney: Length: 9.3 cm. Several small simple renal cysts again noted. Echogenicity within normal limits. No mass or hydronephrosis visualized.  Abdominal aorta: No aneurysm visualized.  Other findings: None.  IMPRESSION: Negative abdomen ultrasound status postcholecystectomy.   Electronically Signed   By: Genevie Ann M.D.   On: 05/20/2014 18:34   Ct Abdomen Pelvis W Contrast  05/20/2014   CLINICAL DATA:  81 year old female with chest and upper abdominal pain shortness of breath nausea vomiting. Initial encounter.  EXAM: CT CHEST, ABDOMEN, AND PELVIS WITH CONTRAST  TECHNIQUE: Multidetector CT imaging of the chest, abdomen and pelvis was performed following the standard protocol during bolus administration of intravenous contrast.  CONTRAST:  70mL OMNIPAQUE IOHEXOL 300 MG/ML  SOLN  COMPARISON:  [facility] Chest CT, CT Abdomen and Pelvis 04/09/2014.  FINDINGS: CT CHEST FINDINGS  Resolved airspace disease in the right lung since January. Stable lower lobe scarring or atelectasis  greater on the left. Major airways are patent. No new pulmonary opacity.  Stable cardiomegaly. No pericardial or pleural effusion. Extensive atherosclerosis of the thoracic aorta. Coronary calcified plaque. Sequelae of CABG. No mediastinal lymphadenopathy. Negative thoracic inlet.  Scoliosis. Degenerative changes in the spine. No acute osseous abnormality identified.  CT ABDOMEN AND PELVIS FINDINGS  No acute osseous abnormality identified.  No pelvic free fluid.  Diminutive bladder.  Sigmoid diverticulosis with indistinct appearance of the sigmoid colon today, evidence of wall thickening. Mild mesenteric stranding. Sigmoid also is inseparable from the left vaginal fornix (sagittal image 61), and there is a small volume of gas in the vaginal vault.  Diverticulosis decrease is in the left colon which is decompressed and not definitely inflamed. Decompressed transverse colon. Oral contrast has reached the right colon. The cecum is located in the pelvis. No dilated small bowel. Negative stomach. Small hiatal hernia. Small duodenum diverticulum.  Hepatic steatosis. Surgically absent gallbladder. Portal venous system within normal limits. Stable spleen, pancreas, adrenal glands and kidneys. Aortoiliac calcified atherosclerosis noted. Major arterial structures  are patent. No abdominal free fluid. No lymphadenopathy.  IMPRESSION: 1. Resolved right lung pneumonia.  No new abnormality in the chest. 2. Sigmoid diverticulosis. No evidence of rupture or abscess. However, colovaginal fistulous possible. 3. Otherwise stable abdomen and pelvis.   Electronically Signed   By: Genevie Ann M.D.   On: 05/20/2014 21:33    Impression:  1.  Nausea and vomiting. 2.  Weight loss. 3.  Anemia, gradual but persistent, over the past few months. 4.  Possibility of colovaginal fistula, unclear etiology or significance, based on recent CT scan.  Doubt this is playing role into her current nausea and vomiting symptoms. 5.  Multiple other  medical problems, including known cardiac disease with intermittent sinus bradycardia.  Plan:  1.  Endoscopy tomorrow for further evaluation, unless her bradycardia worsens.  Will try to get anesthesia to administer propofol for [place]. 2.  Risks (bleeding, infection, bowel perforation that could require surgery, sedation-related changes in cardiopulmonary systems), benefits (identification and possible treatment of source of symptoms, exclusion of certain causes of symptoms), and alternatives (watchful waiting, radiographic imaging studies, empiric medical treatment) of upper endoscopy (EGD) were explained to patient/family in detail and patient wishes to proceed. 3.  Next step in management is pending endoscopy findings.  Further testing could include gastric emptying study.   LOS: 1 day   Jazzman Loughmiller M  05/21/2014, 2:07 PM

## 2014-05-21 NOTE — Progress Notes (Addendum)
[facility] PROGRESS NOTE  [name] VOH:[id number] DOB: January 06, 1932 DOA: 05/20/2014 PCP: [name], MD  Brief Summary  81 y.o. female with Past medical history of COPD, recent treatment for pneumonia, coronary artery disease with CABG with cath 02/2014, hypertension, dyslipidemia, GERD.  The patient is presents with chest and epigastric pain with nausea and vomiting for last month.  She was hospitalized about a month ago for pneumonia and since then she has had no appetite, forcing herself to eat and drink.  She has constant chest pressure and is not sure if anything makes it better or worse.  She denies odynophagia and dysphagia.  Denies hematemesis, melena, hematochezia, diarrhea.  She also has some increased SOB.  She has lost 10-lbs in the last month.  In the ER, CT scan did not reveal any obvious etiology.  She is clearly more anemic which may be contributing to her shortness fo breath and fatigue.  Occult, however, was negative.  Work up for anemia is pending.  Will consult GI if she continues to have problems eating/drinking or if she is occult positive.  She has been bradycardic to the 30s which may also be causing some fatigue and chest pressure.  Will consult Cardiology if she does not improve with holding her BB and transfusion.    Assessment/Plan  Chest pressure in patient with known CAD s/p CABG.  She had a coronary angiography 02/2014 which did not show any significant stenosis amenable to intervention.  Question if this could be related to her bradycardia.  She has sinus bradycardia that trends down to the 30s.  -  Continue aspirin -  Intolerant of statins due to fatigue/muslce weakness.  Continue zetia -  Increase imdur -  Hold BB -  Consider starting norvasc instead for BP control (currently having lower blood pressure) -  ECHO -  Trop neg x 1.  Fatigue, may be due to progressive anemia, poor appetite/weight loss, bradycardia -  D/c BB -  Transfuse 1 unit  PRBC -  Maximize nutrition  Weight loss likely due to anorexia -  Nutrition consult -  Start supplements -  Liberalized diet  Sinus bradycardia to the 30s with sinus arrhythmia and occasional pauses -  D/c BB  Nausea vomiting may be related to gastritis/GERD or due to above -  increase PPI  -  Monitor kidney function on PPI -  GI cocktail  -  Zofran as needed.  HTN, blood pressure low normal  Constipation, continue Miralex.  AKI, may be due to dehydration  -  Gentle hydration -  Repeat BMP in AM  Normocytic anemia, progressive -  Iron/B12/folate/TSH  -  Repeat occult stool  Globulin gap >> if MM, may explain her progressive anemia and kidney fx -  HCV, HIV, SPEP/UPEP  Desaturations at night -  outpateint sleep study -  Prn nightly O2  Progressive weakness and FTT -  PT/OT  Diet:  regular Access:  PIV IVF:  yes Proph:  heparin  Code Status: full Family Communication: patient and her daughter Disposition Plan: pending work up for chest pain, N/V, weight loss, and progressive anemia   Consultants:  Cardiology  Procedures:  ECHO  Antibiotics:  none   HPI/Subjective:  Ongoing chest pressure, anorexia, but was able to eat a few bites of her breakfast this morning.  Very weak and fatigued.    Objective: Filed Vitals:   05/21/14 0000 05/21/14 0110 05/21/14 0415 05/21/14 0500  BP: 114/64 116/66 100/50 100/50  Pulse: 89  86 42 74  Temp: 98.1 F (36.7 C)   97.6 F (36.4 C)  TempSrc: Oral   Oral  Resp: 18 19 15 14   Height: 5\' 2"  (1.575 m)     Weight: 61.553 kg (135 lb 11.2 oz)     SpO2: 96% 96% 91% 95%    Intake/Output Summary (Last 24 hours) at 05/21/14 1019 Last data filed at 05/21/14 0600  Gross per 24 hour  Intake    250 ml  Output      0 ml  Net    250 ml   Filed Weights   05/20/14 1644 05/21/14 0000  Weight: 62.143 kg (137 lb) 61.553 kg (135 lb 11.2 oz)    Exam:   General:  CF, No acute distress  HEENT:  NCAT, MMM, nasal canula  in place  Cardiovascular:  RRR, nl S1, S2, 2/6 systolic murmur, 2+ pulses, warm extremities  Respiratory:  CTAB, no increased WOB  Abdomen:   NABS, soft, NT/ND  MSK:   Normal tone and bulk, no LEE  Neuro:  Grossly intact  Data Reviewed: Basic Metabolic Panel:  Recent Labs Lab 05/20/14 1711  NA 134*  K 3.3*  CL 96  CO2 28  GLUCOSE 117*  BUN 16  CREATININE 1.55*  CALCIUM 8.1*   Liver Function Tests:  Recent Labs Lab 05/20/14 1711  AST 29  ALT 13  ALKPHOS 51  BILITOT 0.9  PROT 8.5*  ALBUMIN 3.0*    Recent Labs Lab 05/20/14 1711  LIPASE 27   No results for input(s): AMMONIA in the last 168 hours. CBC:  Recent Labs Lab 05/20/14 1710  WBC 4.0  HGB 8.9*  HCT 26.8*  MCV 98.5  PLT 221   Cardiac Enzymes:  Recent Labs Lab 05/20/14 0110  CKTOTAL 78  CKMB 0.8  TROPONINI 0.03   BNP (last 3 results)  Recent Labs  05/20/14 1709  BNP 144.9*    ProBNP (last 3 results)  Recent Labs  07/19/13 1149 02/08/14 1727  PROBNP 299.3 121.3    CBG: No results for input(s): GLUCAP in the last 168 hours.  Recent Results (from the past 240 hour(s))  MRSA PCR Screening     Status: None   Collection Time: 05/21/14 12:22 AM  Result Value Ref Range Status   MRSA by PCR NEGATIVE NEGATIVE Final    Comment:        The GeneXpert MRSA Assay (FDA approved for NASAL specimens only), is one component of a comprehensive MRSA colonization surveillance program. It is not intended to diagnose MRSA infection nor to guide or monitor treatment for MRSA infections.      Studies: Dg Chest 2 View (if Patient Has Fever And/or Copd)  05/20/2014   CLINICAL DATA:  Chest pain for 1 day.  Vomiting for 2 weeks  EXAM: CHEST  2 VIEW  COMPARISON:  May 11, 2014  FINDINGS: There is scarring in the lateral left base. There is no edema or consolidation. Heart is upper normal in size with pulmonary vascularity within normal limits. No pneumothorax. Patient is status post  coronary artery bypass grafting. There is atherosclerotic change in the aorta. Prominence in the superior mediastinum is stable and felt to represent great vessel prominence in this age group. No bone lesions.  IMPRESSION: Mild scarring left base.  No edema or consolidation.   Electronically Signed   By: Lowella Grip III M.D.   On: 05/20/2014 17:36   Ct Chest W Contrast  05/20/2014   CLINICAL  DATA:  81 year old female with chest and upper abdominal pain shortness of breath nausea vomiting. Initial encounter.  EXAM: CT CHEST, ABDOMEN, AND PELVIS WITH CONTRAST  TECHNIQUE: Multidetector CT imaging of the chest, abdomen and pelvis was performed following the standard protocol during bolus administration of intravenous contrast.  CONTRAST:  63mL OMNIPAQUE IOHEXOL 300 MG/ML  SOLN  COMPARISON:  [facility] Chest CT, CT Abdomen and Pelvis 04/09/2014.  FINDINGS: CT CHEST FINDINGS  Resolved airspace disease in the right lung since January. Stable lower lobe scarring or atelectasis greater on the left. Major airways are patent. No new pulmonary opacity.  Stable cardiomegaly. No pericardial or pleural effusion. Extensive atherosclerosis of the thoracic aorta. Coronary calcified plaque. Sequelae of CABG. No mediastinal lymphadenopathy. Negative thoracic inlet.  Scoliosis. Degenerative changes in the spine. No acute osseous abnormality identified.  CT ABDOMEN AND PELVIS FINDINGS  No acute osseous abnormality identified.  No pelvic free fluid.  Diminutive bladder.  Sigmoid diverticulosis with indistinct appearance of the sigmoid colon today, evidence of wall thickening. Mild mesenteric stranding. Sigmoid also is inseparable from the left vaginal fornix (sagittal image 61), and there is a small volume of gas in the vaginal vault.  Diverticulosis decrease is in the left colon which is decompressed and not definitely inflamed. Decompressed transverse colon. Oral contrast has reached the right colon. The cecum is located  in the pelvis. No dilated small bowel. Negative stomach. Small hiatal hernia. Small duodenum diverticulum.  Hepatic steatosis. Surgically absent gallbladder. Portal venous system within normal limits. Stable spleen, pancreas, adrenal glands and kidneys. Aortoiliac calcified atherosclerosis noted. Major arterial structures are patent. No abdominal free fluid. No lymphadenopathy.  IMPRESSION: 1. Resolved right lung pneumonia.  No new abnormality in the chest. 2. Sigmoid diverticulosis. No evidence of rupture or abscess. However, colovaginal fistulous possible. 3. Otherwise stable abdomen and pelvis.   Electronically Signed   By: Genevie Ann M.D.   On: 05/20/2014 21:33   US Abdomen Complete  05/20/2014   CLINICAL DATA:  81 year old female with upper abdominal pain with nausea. Prior cholecystectomy. Initial encounter.  EXAM: ULTRASOUND ABDOMEN COMPLETE  COMPARISON:  [facility] CT Abdomen and Pelvis 04/09/2014, and earlier  FINDINGS: Gallbladder: Surgically absent  Common bile duct: Diameter: 7 mm, stable and within normal limits in the post cholecystectomy state.  Liver: No focal lesion identified. Echogenicity at the upper limits of normal. No intrahepatic biliary ductal dilatation.  IVC: No abnormality visualized.  Pancreas: Visualized portion unremarkable.  Spleen: Size and appearance within normal limits.  Right Kidney: Length: 9.7 cm. Echogenicity within normal limits. No mass or hydronephrosis visualized.  Left Kidney: Length: 9.3 cm. Several small simple renal cysts again noted. Echogenicity within normal limits. No mass or hydronephrosis visualized.  Abdominal aorta: No aneurysm visualized.  Other findings: None.  IMPRESSION: Negative abdomen ultrasound status postcholecystectomy.   Electronically Signed   By: Genevie Ann M.D.   On: 05/20/2014 18:34   Ct Abdomen Pelvis W Contrast  05/20/2014   CLINICAL DATA:  81 year old female with chest and upper abdominal pain shortness of breath nausea vomiting.  Initial encounter.  EXAM: CT CHEST, ABDOMEN, AND PELVIS WITH CONTRAST  TECHNIQUE: Multidetector CT imaging of the chest, abdomen and pelvis was performed following the standard protocol during bolus administration of intravenous contrast.  CONTRAST:  41mL OMNIPAQUE IOHEXOL 300 MG/ML  SOLN  COMPARISON:  [facility] Chest CT, CT Abdomen and Pelvis 04/09/2014.  FINDINGS: CT CHEST FINDINGS  Resolved airspace disease in the right lung since January.  Stable lower lobe scarring or atelectasis greater on the left. Major airways are patent. No new pulmonary opacity.  Stable cardiomegaly. No pericardial or pleural effusion. Extensive atherosclerosis of the thoracic aorta. Coronary calcified plaque. Sequelae of CABG. No mediastinal lymphadenopathy. Negative thoracic inlet.  Scoliosis. Degenerative changes in the spine. No acute osseous abnormality identified.  CT ABDOMEN AND PELVIS FINDINGS  No acute osseous abnormality identified.  No pelvic free fluid.  Diminutive bladder.  Sigmoid diverticulosis with indistinct appearance of the sigmoid colon today, evidence of wall thickening. Mild mesenteric stranding. Sigmoid also is inseparable from the left vaginal fornix (sagittal image 61), and there is a small volume of gas in the vaginal vault.  Diverticulosis decrease is in the left colon which is decompressed and not definitely inflamed. Decompressed transverse colon. Oral contrast has reached the right colon. The cecum is located in the pelvis. No dilated small bowel. Negative stomach. Small hiatal hernia. Small duodenum diverticulum.  Hepatic steatosis. Surgically absent gallbladder. Portal venous system within normal limits. Stable spleen, pancreas, adrenal glands and kidneys. Aortoiliac calcified atherosclerosis noted. Major arterial structures are patent. No abdominal free fluid. No lymphadenopathy.  IMPRESSION: 1. Resolved right lung pneumonia.  No new abnormality in the chest. 2. Sigmoid diverticulosis. No evidence of  rupture or abscess. However, colovaginal fistulous possible. 3. Otherwise stable abdomen and pelvis.   Electronically Signed   By: Genevie Ann M.D.   On: 05/20/2014 21:33    Scheduled Meds: . aspirin  325 mg Oral Daily  . ezetimibe  10 mg Oral QHS  . heparin  5,000 Units Subcutaneous 3 times per day  . isosorbide mononitrate  30 mg Oral Daily  . pantoprazole (PROTONIX) IV  40 mg Intravenous Q12H  . polyethylene glycol  17 g Oral Daily  . traZODone  100 mg Oral QHS   Continuous Infusions: . sodium chloride 50 mL/hr (05/21/14 0049)    Principal Problem:   Chest pressure Active Problems:   GERD (gastroesophageal reflux disease)   COPD (chronic obstructive pulmonary disease)   Hypertension   CAD (coronary artery disease) of artery bypass graft, atretic LIMA    Time spent: 30 min    Xuan Mateus, [facility] Pager [phone]. If 7PM-7AM, please contact night-coverage at [email or web address], password [facility] 05/21/2014, 10:19 AM  LOS: 1 day

## 2014-05-22 ENCOUNTER — Observation Stay (HOSPITAL_COMMUNITY): Payer: Medicare Other | Admitting: Certified Registered Nurse Anesthetist

## 2014-05-22 ENCOUNTER — Encounter (HOSPITAL_COMMUNITY)
Admission: EM | Disposition: A | Discharge status: Skilled Nursing Facility | Payer: Self-pay | Source: Home / Self Care | Attending: Internal Medicine

## 2014-05-22 ENCOUNTER — Encounter (HOSPITAL_COMMUNITY): Payer: Self-pay | Admitting: *Deleted

## 2014-05-22 DIAGNOSIS — E440 Moderate protein-calorie malnutrition: Secondary | ICD-10-CM | POA: Insufficient documentation

## 2014-05-22 DIAGNOSIS — E43 Unspecified severe protein-calorie malnutrition: Secondary | ICD-10-CM

## 2014-05-22 HISTORY — PX: ESOPHAGOGASTRODUODENOSCOPY (EGD) WITH PROPOFOL: SHX5813

## 2014-05-22 LAB — CBC
HCT: 25.5 % — ABNORMAL LOW (ref 36.0–46.0)
Hemoglobin: 8.2 g/dL — ABNORMAL LOW (ref 12.0–15.0)
MCH: 30.8 pg (ref 26.0–34.0)
MCHC: 32.2 g/dL (ref 30.0–36.0)
MCV: 95.9 fL (ref 78.0–100.0)
PLATELETS: 151 10*3/uL (ref 150–400)
RBC: 2.66 MIL/uL — AB (ref 3.87–5.11)
RDW: 20.8 % — AB (ref 11.5–15.5)
WBC: 3.2 10*3/uL — ABNORMAL LOW (ref 4.0–10.5)

## 2014-05-22 LAB — BASIC METABOLIC PANEL
Anion gap: 3 — ABNORMAL LOW (ref 5–15)
BUN: 13 mg/dL (ref 6–23)
CALCIUM: 7.6 mg/dL — AB (ref 8.4–10.5)
CO2: 31 mmol/L (ref 19–32)
Chloride: 103 mmol/L (ref 96–112)
Creatinine, Ser: 1.24 mg/dL — ABNORMAL HIGH (ref 0.50–1.10)
GFR calc Af Amer: 46 mL/min — ABNORMAL LOW (ref >90)
GFR calc non Af Amer: 40 mL/min — ABNORMAL LOW (ref >90)
GLUCOSE: 105 mg/dL — AB (ref 70–99)
Potassium: 3.4 mmol/L — ABNORMAL LOW (ref 3.5–5.1)
Sodium: 137 mmol/L (ref 135–145)

## 2014-05-22 LAB — TRANSFERRIN: TRANSFERRIN: 156 mg/dL — AB (ref 200–370)

## 2014-05-22 LAB — OCCULT BLOOD X 1 CARD TO LAB, STOOL: FECAL OCCULT BLD: NEGATIVE

## 2014-05-22 SURGERY — ESOPHAGOGASTRODUODENOSCOPY (EGD) WITH PROPOFOL
Anesthesia: Monitor Anesthesia Care | Laterality: Left

## 2014-05-22 MED ORDER — SENNA 8.6 MG PO TABS
2.0000 | ORAL_TABLET | Freq: Every day | ORAL | Status: DC
  Administered 2014-05-22 – 2014-05-24 (×3): 17.2 mg via ORAL
  Filled 2014-05-22 (×3): qty 2

## 2014-05-22 MED ORDER — BUTAMBEN-TETRACAINE-BENZOCAINE 2-2-14 % EX AERO
INHALATION_SPRAY | CUTANEOUS | Status: DC | PRN
  Administered 2014-05-22: 2 via TOPICAL

## 2014-05-22 MED ORDER — LACTATED RINGERS IV SOLN
INTRAVENOUS | Status: DC | PRN
  Administered 2014-05-22: 09:00:00 via INTRAVENOUS

## 2014-05-22 MED ORDER — BOOST / RESOURCE BREEZE PO LIQD
1.0000 | Freq: Three times a day (TID) | ORAL | Status: DC
  Administered 2014-05-22 – 2014-05-25 (×2): 1 via ORAL

## 2014-05-22 MED ORDER — DOCUSATE SODIUM 100 MG PO CAPS
100.0000 mg | ORAL_CAPSULE | Freq: Two times a day (BID) | ORAL | Status: DC
  Administered 2014-05-22 – 2014-05-25 (×5): 100 mg via ORAL
  Filled 2014-05-22 (×5): qty 1

## 2014-05-22 MED ORDER — GLYCOPYRROLATE 0.2 MG/ML IJ SOLN
INTRAMUSCULAR | Status: DC | PRN
  Administered 2014-05-22: 0.2 mg via INTRAVENOUS

## 2014-05-22 MED ORDER — POTASSIUM CHLORIDE CRYS ER 20 MEQ PO TBCR
40.0000 meq | EXTENDED_RELEASE_TABLET | Freq: Once | ORAL | Status: AC
  Administered 2014-05-22: 40 meq via ORAL
  Filled 2014-05-22: qty 2

## 2014-05-22 MED ORDER — PROPOFOL 10 MG/ML IV BOLUS
INTRAVENOUS | Status: DC | PRN
  Administered 2014-05-22: 50 mg via INTRAVENOUS

## 2014-05-22 MED ORDER — BISACODYL 5 MG PO TBEC
5.0000 mg | DELAYED_RELEASE_TABLET | Freq: Once | ORAL | Status: AC
  Administered 2014-05-22: 5 mg via ORAL
  Filled 2014-05-22: qty 1

## 2014-05-22 MED ORDER — LIDOCAINE HCL (CARDIAC) 20 MG/ML IV SOLN
INTRAVENOUS | Status: DC | PRN
  Administered 2014-05-22: 50 mg via INTRAVENOUS

## 2014-05-22 MED ORDER — SODIUM CHLORIDE 0.9 % IV SOLN: INTRAVENOUS | Status: DC

## 2014-05-22 MED ORDER — LACTATED RINGERS IV SOLN
INTRAVENOUS | Status: DC
  Administered 2014-05-22: 1000 mL via INTRAVENOUS

## 2014-05-22 MED ORDER — PROPOFOL INFUSION 10 MG/ML OPTIME
INTRAVENOUS | Status: DC | PRN
  Administered 2014-05-22: 75 ug/kg/min via INTRAVENOUS

## 2014-05-22 NOTE — Evaluation (Addendum)
Physical Therapy Evaluation Patient Details Name: [name] MRN: [id number] DOB: September 15, 1931 Today's Date: 05/22/2014   History of Present Illness  Pt admit with chest pressure.  Bradycardia and nausea and vomiting as well.  Gastritis and anemia.   Clinical Impression  Pt admitted with above diagnosis. Pt currently with functional limitations due to the deficits listed below (see PT Problem List). Pt will need to use RW initially at all times for safety.  Feel that she will be safe at home with RW and HHPT f/u until she gains her strength.  Pt agrees.   Pt will benefit from skilled PT to increase their independence and safety with mobility to allow discharge to the venue listed below.      Follow Up Recommendations Home health PT;Supervision/Assistance - 24 hour    Equipment Recommendations  None recommended by PT    Recommendations for Other Services       Precautions / Restrictions Precautions Precautions: Fall Restrictions Weight Bearing Restrictions: No      Mobility  Bed Mobility Overal bed mobility: Independent                Transfers Overall transfer level: Independent                  Ambulation/Gait Ambulation/Gait assistance: Min guard Ambulation Distance (Feet): 40 Feet (20 feet x 2) Assistive device: None Gait Pattern/deviations: Step-through pattern;Decreased stride length   Gait velocity interpretation: Below normal speed for age/gender General Gait Details: Pt ambulated to bathroom and then back to chair.  Feeling tired and not up to walking in halls.  Fairly steady overall.  Did not need UE support but occasionally needed steadying assist for safety.    Stairs            Wheelchair Mobility    Modified Rankin (Stroke Patients Only)       Balance                                             Pertinent Vitals/Pain Pain Assessment: No/denies pain  VSS with sats on RA 90-92%.  Replaced O2 at 2LO2 with  sats 91-94%.   BP 115/45.      Home Living Family/patient expects to be discharged to:: Private residence Living Arrangements: Children (lives with daughter who works) Available Help at Discharge: Available PRN/intermittently;Family Type of Home: Mobile home Home Access: Stairs to enter Entrance Stairs-Rails: Right;Left;Can reach both Entrance Stairs-Number of Steps: 4 Home Layout: One level Home Equipment: Walker - 2 wheels;Tub bench      Prior Function Level of Independence: Independent;Needs assistance   Gait / Transfers Assistance Needed: Walked by herself  ADL's / Homemaking Assistance Needed: uses shower bench but can bathe self  Comments: Pt did wash and made beds etc.     Hand Dominance   Dominant Hand: Right    Extremity/Trunk Assessment   Upper Extremity Assessment: Defer to OT evaluation           Lower Extremity Assessment: Generalized weakness      Cervical / Trunk Assessment: Normal  Communication   Communication: HOH  Cognition Arousal/Alertness: Awake/alert Behavior During Therapy: WFL for tasks assessed/performed Overall Cognitive Status: Within Functional Limits for tasks assessed                      General Comments  Exercises        Assessment/Plan    PT Assessment Patient needs continued PT services  PT Diagnosis Generalized weakness   PT Problem List Decreased activity tolerance;Decreased balance;Decreased mobility;Decreased knowledge of use of DME;Decreased safety awareness;Decreased knowledge of precautions  PT Treatment Interventions DME instruction;Gait training;Functional mobility training;Therapeutic activities;Therapeutic exercise;Balance training;Patient/family education;Stair training   PT Goals (Current goals can be found in the Care Plan section) Acute Rehab PT Goals Patient Stated Goal: to go home PT Goal Formulation: With patient Time For Goal Achievement: 05/29/14 Potential to Achieve Goals: Good     Frequency Min 3X/week   Barriers to discharge Decreased caregiver support (daughter works)      Building control surveyor of Session Equipment Utilized During Treatment: Gait belt;Oxygen Activity Tolerance: Patient limited by fatigue Patient left: in chair;with call bell/phone within reach;with chair alarm set Nurse Communication: Mobility status    Functional Assessment Tool Used: clinical judgment Functional Limitation: Mobility: Walking and moving around Mobility: Walking and Moving Around Current Status (F5825): At least 1 percent but less than 20 percent impaired, limited or restricted Mobility: Walking and Moving Around Goal Status [phone]): At least 1 percent but less than 20 percent impaired, limited or restricted    Time: 1155-1220 PT Time Calculation (min) (ACUTE ONLY): 25 min   Charges:   PT Evaluation $Initial PT Evaluation Tier I: 1 Procedure PT Treatments $Gait Training: 8-22 mins   PT G Codes:   PT G-Codes **NOT FOR INPATIENT CLASS** Functional Assessment Tool Used: clinical judgment Functional Limitation: Mobility: Walking and moving around Mobility: Walking and Moving Around Current Status (K1031): At least 1 percent but less than 20 percent impaired, limited or restricted Mobility: Walking and Moving Around Goal Status [phone]): At least 1 percent but less than 20 percent impaired, limited or restricted    Denice Paradise 05/22/2014, 1:45 PM [facility] Acute Rehabilitation [phone] (pager)

## 2014-05-22 NOTE — H&P (View-Only) (Signed)
[facility] Consultation Note  Referring Provider:  Dr. Janece Canterbury [facility]) Primary Care Physician:  [name], MD  Reason for Consultation:  Nausea, vomiting, weight loss.  HPI: [name] is a 81 y.o. female whom we've been asked to see for the above reasons.  She has had several months of intermittent post-prandial nausea and vomiting, which over the past couple weeks, has significantly worsened.  She denies dysphagia, but will have vomiting very quickly after eating solid foods, once she feels like it has reached the stomach.  She is able to tolerate liquids.  Vomiting is now after most solid foods.  She has lost about 10 lbs over the past couple weeks.  No hematemesis, hematochezia.  Has "pressure" discomfort in periumbilical and epigastric region.  Takes NSAIDs on an occasional basis.  No prior endoscopy or colonoscopy.  Recent CT chest abdomen pelvis done yesterday, results as below.   Past Medical History  Diagnosis Date  . COPD (chronic obstructive pulmonary disease)   . Gout   . Coronary atherosclerosis of native coronary artery 02/05/2012  . Hypertension   . High blood cholesterol   . Heart murmur     "all my life" (02/05/2012)  . Anginal pain   . Myocardial infarction 1990's; 2000's    "2 total, I think" (02/05/2012)  . Pneumonia     "several times" (02/05/2012)  . Chronic bronchitis   . Shortness of breath     "sometimes when I'm laying down; always when I do too much" (02/05/2012)  . History of blood transfusion   . External bleeding hemorrhoids     "act up at times" (02/05/2012)  . Chronic back pain     "all over" (02/05/2012)  . Depression   . CAD (coronary artery disease) of artery bypass graft 02/09/14    atretic LIMA    Past Surgical History  Procedure Laterality Date  . Appendectomy  ? 1970's  . Ectopic pregnancy surgery  1970's  . Abdominal hysterectomy  ? 1970's    "partial 1st time; complete 2nd" (02/05/2012)  . Cholecystectomy   ~ 2010  . Cataract extraction w/ intraocular lens  implant, bilateral  [phone]  . Coronary angioplasty with stent placement  2005  . Coronary artery bypass graft  2005    CABG X2  . Cardiac catheterization  02/09/14    atretic LIMA,  patent VG-dRCA and Total occlusion of the native RCA proximally prior to remotely placement RCA stent, with mild proximal 30% LAD stenosis and 30% distal circumflex stenoses.  EF 55%.  . Left heart catheterization with coronary/graft angiogram N/A 02/09/2014    Procedure: LEFT HEART CATHETERIZATION WITH Beatrix Fetters;  Surgeon: Troy Sine, MD;  Location: [facility] CATH LAB;  Service: Cardiovascular;  Laterality: N/A;    Prior to Admission medications   Medication Sig Start Date End Date Taking? Authorizing Provider  ALPRAZolam Duanne Moron) 0.5 MG tablet Take 0.25 mg by mouth daily as needed for anxiety.  05/11/14  Yes Historical Provider, MD  aspirin 325 MG EC tablet Take 325 mg by mouth daily.   Yes Historical Provider, MD  cholecalciferol (VITAMIN D) 1000 UNITS tablet Take 1,000 Units by mouth daily.   Yes Historical Provider, MD  ezetimibe (ZETIA) 10 MG tablet Take 10 mg by mouth at bedtime.    Yes Historical Provider, MD  hydrochlorothiazide (HYDRODIURIL) 25 MG tablet Take 25 mg by mouth daily.   Yes Historical Provider, MD  IRON PO Take 1 tablet by mouth daily.  Yes Historical Provider, MD  isosorbide mononitrate (IMDUR) 30 MG 24 hr tablet Take 1 tablet (30 mg total) by mouth daily. 02/11/14  Yes Luke K Kilroy, PA-C  metoprolol succinate (TOPROL-XL) 25 MG 24 hr tablet Take 12.5 mg by mouth daily.   Yes Historical Provider, MD  nitroGLYCERIN (NITROSTAT) 0.4 MG SL tablet Place 0.4 mg under the tongue every 5 (five) minutes as needed for chest pain.   Yes Historical Provider, MD  Omega-3 Fatty Acids (FISH OIL) 1200 MG CAPS Take 1,200-2,400 mg by mouth 2 (two) times daily. Take 2 capsules (2400 mg) every morning and 1 capsule (1200 mg) every night   Yes  Historical Provider, MD  ondansetron (ZOFRAN) 4 MG tablet Take 4 mg by mouth every 8 (eight) hours as needed for nausea or vomiting.  05/16/14  Yes Historical Provider, MD  pantoprazole (PROTONIX) 40 MG tablet Take 40 mg by mouth daily. 05/16/14  Yes Historical Provider, MD  polyethylene glycol powder (GLYCOLAX/MIRALAX) powder Take 2 Containers by mouth at bedtime. Mix with 8 oz liquid and drink 05/16/14  Yes Historical Provider, MD  RA SENNA 8.6 MG tablet Take 1 tablet by mouth daily as needed for constipation.  02/07/14  Yes Historical Provider, MD  tiotropium (SPIRIVA) 18 MCG inhalation capsule Place 18 mcg into inhaler and inhale daily as needed (shortness of breath).    Yes Historical Provider, MD  traMADol (ULTRAM) 50 MG tablet Take 100 mg by mouth every 6 (six) hours as needed (pain). For pain   Yes Historical Provider, MD  loratadine (CLARITIN) 10 MG tablet Take 1 tablet (10 mg total) by mouth daily as needed for allergies, rhinitis or itching. Patient not taking: Reported on 05/20/2014 02/11/14   Erlene Quan, PA-C  metoprolol succinate (TOPROL-XL) 12.5 mg TB24 24 hr tablet Take 0.5 tablets (12.5 mg total) by mouth daily. Patient not taking: Reported on 05/20/2014 02/11/14   Doreene Burke Kilroy, PA-C  NITROSTAT 0.4 MG SL tablet Place 0.4 mg under the tongue every 5 (five) minutes as needed for chest pain.  01/06/13   Historical Provider, MD  Potassium 99 MG TABS Take 99 mg by mouth daily.    Historical Provider, MD  potassium chloride SA (K-DUR,KLOR-CON) 20 MEQ tablet Take 20 mEq by mouth 2 (two) times daily.    Historical Provider, MD  sertraline (ZOLOFT) 25 MG tablet Take 25 mg by mouth daily. 04/19/14   Historical Provider, MD  traZODone (DESYREL) 100 MG tablet Take 100 mg by mouth at bedtime. 04/19/14   Historical Provider, MD    Current Facility-Administered Medications  Medication Dose Route Frequency Provider Last Rate Last Dose  . 0.9 %  sodium chloride infusion   Intravenous Continuous Berle Mull, MD 50 mL/hr at 05/21/14 0049 50 mL/hr at 05/21/14 0049  . 0.9 %  sodium chloride infusion   Intravenous Once Janece Canterbury, MD      . acetaminophen (TYLENOL) tablet 650 mg  650 mg Oral Q4H PRN Berle Mull, MD      . ALPRAZolam Duanne Moron) tablet 0.25 mg  0.25 mg Oral Daily PRN Berle Mull, MD      . aspirin EC tablet 325 mg  325 mg Oral Daily Berle Mull, MD   325 mg at 05/21/14 1014  . ezetimibe (ZETIA) tablet 10 mg  10 mg Oral QHS Berle Mull, MD   10 mg at 05/21/14 0048  . feeding supplement (ENSURE COMPLETE) (ENSURE COMPLETE) liquid 237 mL  237 mL Oral BID BM Noah Delaine  Short, MD      . fentaNYL (SUBLIMAZE) injection 12.5-25 mcg  12.5-25 mcg Intravenous Q4H PRN Berle Mull, MD      . heparin injection 5,000 Units  5,000 Units Subcutaneous 3 times per day Berle Mull, MD   5,000 Units at 05/21/14 0609  . [START ON 05/22/2014] isosorbide mononitrate (IMDUR) 24 hr tablet 60 mg  60 mg Oral Daily Janece Canterbury, MD      . ondansetron [facility]) injection 4 mg  4 mg Intravenous Q6H PRN Berle Mull, MD      . pantoprazole (PROTONIX) EC tablet 40 mg  40 mg Oral BID AC Janece Canterbury, MD      . polyethylene glycol (MIRALAX / GLYCOLAX) packet 17 g  17 g Oral Daily Berle Mull, MD   17 g at 05/21/14 1015  . tiotropium (SPIRIVA) inhalation capsule 18 mcg  18 mcg Inhalation Daily PRN Berle Mull, MD      . traMADol Veatrice Bourbon) tablet 100 mg  100 mg Oral Q6H PRN Berle Mull, MD      . traZODone (DESYREL) tablet 100 mg  100 mg Oral QHS Berle Mull, MD   100 mg at 05/21/14 0048    Allergies as of 05/20/2014 - Review Complete 05/20/2014  Allergen Reaction Noted  . Lexapro [escitalopram oxalate] Other (See Comments) 02/05/2012  . Soma [carisoprodol] Other (See Comments) 02/05/2012  . Wellbutrin [bupropion] Other (See Comments) 02/05/2012  . Albuterol sulfate Other (See Comments) 02/05/2012  . Codeine Hives 02/05/2012  . Levaquin [levofloxacin in d5w] Other (See Comments) 05/20/2014  . Plavix  [clopidogrel bisulfate] Other (See Comments) 02/05/2012    Family History  Problem Relation Age of Onset  . Anemia Neg Hx   . Arrhythmia Neg Hx   . Asthma Neg Hx   . Clotting disorder Neg Hx   . Fainting Neg Hx   . Heart attack Neg Hx   . Heart disease Neg Hx   . Heart failure Neg Hx   . Hyperlipidemia Neg Hx   . Hypertension Neg Hx   . Aneurysm    . CVA    . CVA Mother   . Aneurysm Father     History   Social History  . Marital Status: Widowed    Spouse Name: N/A  . Number of Children: N/A  . Years of Education: N/A   Occupational History  . Not on file.   Social History Main Topics  . Smoking status: Former Smoker -- 1.00 packs/day for 50 years    Types: Cigarettes    Quit date: 04/02/2011  . Smokeless tobacco: Never Used  . Alcohol Use: Yes     Comment: 02/05/2012 "have a drink of wine ~ q 6 months"  . Drug Use: No  . Sexual Activity: Not Currently   Other Topics Concern  . Not on file   Social History Narrative    Review of Systems: As per HPI, all others negative  Physical Exam: Vital signs in last 24 hours: Temp:  [97.4 F (36.3 C)-98.8 F (37.1 C)] 98.8 F (37.1 C) (02/14 1321) Pulse Rate:  [42-216] 57 (02/14 1321) Resp:  [12-23] 18 (02/14 1321) BP: (100-148)/(37-99) 102/49 mmHg (02/14 1321) SpO2:  [85 %-99 %] 86 % (02/14 1321) Weight:  [61.553 kg (135 lb 11.2 oz)-62.143 kg (137 lb)] 61.553 kg (135 lb 11.2 oz) (02/14 0000) Last BM Date: 05/19/14 General:   Alert,  Chronically ill-appearing, but is in no acute distress Head:  Normocephalic and atraumatic. Eyes:  Sclera clear, no icterus.   Conjunctiva pale Ears:  Normal auditory acuity. Nose:  No deformity, discharge,  or lesions. Mouth:  No deformity or lesions.  Oropharynx pale and somewhat dry Neck:  Supple; no masses or thyromegaly. Lungs:  Clear throughout to auscultation.   No wheezes, crackles, or rhonchi. No acute distress. Heart:  Regular rate and rhythm; IV/VI SEM LUSB, no clicks,  rubs,  or gallops. Abdomen:  Soft, nontender but mild distended. No masses, hepatosplenomegaly or hernias noted. Normal bowel sounds, without guarding, and without rebound.     Msk:  Symmetrical without gross deformities. Normal posture. Pulses:  Normal pulses noted. Extremities:  Without clubbing or edema. Neurologic:  Alert and  oriented x4;  grossly normal neurologically. Skin:  Pale-appearing, but otherwise intact without significant lesions or rashes. Psych:  Alert and cooperative. Normal mood and affect.  Lab Results:  Recent Labs  05/20/14 1710 05/21/14 0920  WBC 4.0 2.9*  HGB 8.9* 7.6*  HCT 26.8* 23.6*  PLT 221 171   BMET  Recent Labs  05/20/14 1711 05/21/14 0920  NA 134* 134*  K 3.3* 3.4*  CL 96 99  CO2 28 26  GLUCOSE 117* 92  BUN 16 14  CREATININE 1.55* 1.39*  CALCIUM 8.1* 8.1*   LFT  Recent Labs  05/21/14 0920  PROT 7.1  ALBUMIN 2.4*  AST 22  ALT 11  ALKPHOS 42  BILITOT 0.7   PT/INR  Recent Labs  05/20/14 1711 05/21/14 0920  LABPROT 14.0 15.2  INR 1.07 1.18    Studies/Results: Dg Chest 2 View (if Patient Has Fever And/or Copd)  05/20/2014   CLINICAL DATA:  Chest pain for 1 day.  Vomiting for 2 weeks  EXAM: CHEST  2 VIEW  COMPARISON:  May 11, 2014  FINDINGS: There is scarring in the lateral left base. There is no edema or consolidation. Heart is upper normal in size with pulmonary vascularity within normal limits. No pneumothorax. Patient is status post coronary artery bypass grafting. There is atherosclerotic change in the aorta. Prominence in the superior mediastinum is stable and felt to represent great vessel prominence in this age group. No bone lesions.  IMPRESSION: Mild scarring left base.  No edema or consolidation.   Electronically Signed   By: Lowella Grip III M.D.   On: 05/20/2014 17:36   Ct Chest W Contrast  05/20/2014   CLINICAL DATA:  81 year old female with chest and upper abdominal pain shortness of breath nausea  vomiting. Initial encounter.  EXAM: CT CHEST, ABDOMEN, AND PELVIS WITH CONTRAST  TECHNIQUE: Multidetector CT imaging of the chest, abdomen and pelvis was performed following the standard protocol during bolus administration of intravenous contrast.  CONTRAST:  19mL OMNIPAQUE IOHEXOL 300 MG/ML  SOLN  COMPARISON:  [facility] Chest CT, CT Abdomen and Pelvis 04/09/2014.  FINDINGS: CT CHEST FINDINGS  Resolved airspace disease in the right lung since January. Stable lower lobe scarring or atelectasis greater on the left. Major airways are patent. No new pulmonary opacity.  Stable cardiomegaly. No pericardial or pleural effusion. Extensive atherosclerosis of the thoracic aorta. Coronary calcified plaque. Sequelae of CABG. No mediastinal lymphadenopathy. Negative thoracic inlet.  Scoliosis. Degenerative changes in the spine. No acute osseous abnormality identified.  CT ABDOMEN AND PELVIS FINDINGS  No acute osseous abnormality identified.  No pelvic free fluid.  Diminutive bladder.  Sigmoid diverticulosis with indistinct appearance of the sigmoid colon today, evidence of wall thickening. Mild mesenteric stranding. Sigmoid also is inseparable from the left vaginal  fornix (sagittal image 61), and there is a small volume of gas in the vaginal vault.  Diverticulosis decrease is in the left colon which is decompressed and not definitely inflamed. Decompressed transverse colon. Oral contrast has reached the right colon. The cecum is located in the pelvis. No dilated small bowel. Negative stomach. Small hiatal hernia. Small duodenum diverticulum.  Hepatic steatosis. Surgically absent gallbladder. Portal venous system within normal limits. Stable spleen, pancreas, adrenal glands and kidneys. Aortoiliac calcified atherosclerosis noted. Major arterial structures are patent. No abdominal free fluid. No lymphadenopathy.  IMPRESSION: 1. Resolved right lung pneumonia.  No new abnormality in the chest. 2. Sigmoid diverticulosis. No  evidence of rupture or abscess. However, colovaginal fistulous possible. 3. Otherwise stable abdomen and pelvis.   Electronically Signed   By: Genevie Ann M.D.   On: 05/20/2014 21:33   US Abdomen Complete  05/20/2014   CLINICAL DATA:  81 year old female with upper abdominal pain with nausea. Prior cholecystectomy. Initial encounter.  EXAM: ULTRASOUND ABDOMEN COMPLETE  COMPARISON:  [facility] CT Abdomen and Pelvis 04/09/2014, and earlier  FINDINGS: Gallbladder: Surgically absent  Common bile duct: Diameter: 7 mm, stable and within normal limits in the post cholecystectomy state.  Liver: No focal lesion identified. Echogenicity at the upper limits of normal. No intrahepatic biliary ductal dilatation.  IVC: No abnormality visualized.  Pancreas: Visualized portion unremarkable.  Spleen: Size and appearance within normal limits.  Right Kidney: Length: 9.7 cm. Echogenicity within normal limits. No mass or hydronephrosis visualized.  Left Kidney: Length: 9.3 cm. Several small simple renal cysts again noted. Echogenicity within normal limits. No mass or hydronephrosis visualized.  Abdominal aorta: No aneurysm visualized.  Other findings: None.  IMPRESSION: Negative abdomen ultrasound status postcholecystectomy.   Electronically Signed   By: Genevie Ann M.D.   On: 05/20/2014 18:34   Ct Abdomen Pelvis W Contrast  05/20/2014   CLINICAL DATA:  81 year old female with chest and upper abdominal pain shortness of breath nausea vomiting. Initial encounter.  EXAM: CT CHEST, ABDOMEN, AND PELVIS WITH CONTRAST  TECHNIQUE: Multidetector CT imaging of the chest, abdomen and pelvis was performed following the standard protocol during bolus administration of intravenous contrast.  CONTRAST:  101mL OMNIPAQUE IOHEXOL 300 MG/ML  SOLN  COMPARISON:  [facility] Chest CT, CT Abdomen and Pelvis 04/09/2014.  FINDINGS: CT CHEST FINDINGS  Resolved airspace disease in the right lung since January. Stable lower lobe scarring or atelectasis  greater on the left. Major airways are patent. No new pulmonary opacity.  Stable cardiomegaly. No pericardial or pleural effusion. Extensive atherosclerosis of the thoracic aorta. Coronary calcified plaque. Sequelae of CABG. No mediastinal lymphadenopathy. Negative thoracic inlet.  Scoliosis. Degenerative changes in the spine. No acute osseous abnormality identified.  CT ABDOMEN AND PELVIS FINDINGS  No acute osseous abnormality identified.  No pelvic free fluid.  Diminutive bladder.  Sigmoid diverticulosis with indistinct appearance of the sigmoid colon today, evidence of wall thickening. Mild mesenteric stranding. Sigmoid also is inseparable from the left vaginal fornix (sagittal image 61), and there is a small volume of gas in the vaginal vault.  Diverticulosis decrease is in the left colon which is decompressed and not definitely inflamed. Decompressed transverse colon. Oral contrast has reached the right colon. The cecum is located in the pelvis. No dilated small bowel. Negative stomach. Small hiatal hernia. Small duodenum diverticulum.  Hepatic steatosis. Surgically absent gallbladder. Portal venous system within normal limits. Stable spleen, pancreas, adrenal glands and kidneys. Aortoiliac calcified atherosclerosis noted. Major arterial structures  are patent. No abdominal free fluid. No lymphadenopathy.  IMPRESSION: 1. Resolved right lung pneumonia.  No new abnormality in the chest. 2. Sigmoid diverticulosis. No evidence of rupture or abscess. However, colovaginal fistulous possible. 3. Otherwise stable abdomen and pelvis.   Electronically Signed   By: Genevie Ann M.D.   On: 05/20/2014 21:33    Impression:  1.  Nausea and vomiting. 2.  Weight loss. 3.  Anemia, gradual but persistent, over the past few months. 4.  Possibility of colovaginal fistula, unclear etiology or significance, based on recent CT scan.  Doubt this is playing role into her current nausea and vomiting symptoms. 5.  Multiple other  medical problems, including known cardiac disease with intermittent sinus bradycardia.  Plan:  1.  Endoscopy tomorrow for further evaluation, unless her bradycardia worsens.  Will try to get anesthesia to administer propofol for [place]. 2.  Risks (bleeding, infection, bowel perforation that could require surgery, sedation-related changes in cardiopulmonary systems), benefits (identification and possible treatment of source of symptoms, exclusion of certain causes of symptoms), and alternatives (watchful waiting, radiographic imaging studies, empiric medical treatment) of upper endoscopy (EGD) were explained to patient/family in detail and patient wishes to proceed. 3.  Next step in management is pending endoscopy findings.  Further testing could include gastric emptying study.   LOS: 1 day   Katlyne Nishida M  05/21/2014, 2:07 PM

## 2014-05-22 NOTE — Interval H&P Note (Signed)
History and Physical Interval Note:  05/22/2014 9:05 AM  [name]  has presented today for surgery, with the diagnosis of Nausea, vomiting, weight loss, anemia  The various methods of treatment have been discussed with the patient and family. After consideration of risks, benefits and other options for treatment, the patient has consented to  Procedure(s): ESOPHAGOGASTRODUODENOSCOPY (EGD) WITH PROPOFOL (Left) as a surgical intervention .  The patient's history has been reviewed, patient examined, no change in status, stable for surgery.  I have reviewed the patient's chart and labs.  Questions were answered to the patient's satisfaction.     Rishabh Rinkenberger M  Assessment:  1.  Nausea and vomiting. 2.  Abdominal discomfort. 3.  Anemia (hemoccult-negative).  Plan:  1.  Endoscopy. 2.  Risks (bleeding, infection, bowel perforation that could require surgery, sedation-related changes in cardiopulmonary systems), benefits (identification and possible treatment of source of symptoms, exclusion of certain causes of symptoms), and alternatives (watchful waiting, radiographic imaging studies, empiric medical treatment) of upper endoscopy (EGD) were explained to patient/family in detail and patient wishes to proceed. 3.  If endoscopy is unrevealing, might consider gastric emptying study.

## 2014-05-22 NOTE — Progress Notes (Signed)
[facility] PROGRESS NOTE  ROSIO [name] QVZ:[id number] DOB: 1931/06/11 DOA: 05/20/2014 PCP: [name], MD  Brief Summary  81 y.o. female with Past medical history of COPD, recent treatment for pneumonia, coronary artery disease with CABG with cath 02/2014, hypertension, dyslipidemia, GERD.  The patient is presents with chest and epigastric pain with nausea and vomiting for last month.  She was hospitalized about a month ago for pneumonia and since then she has had no appetite, forcing herself to eat and drink.  She has constant chest pressure and is not sure if anything makes it better or worse.  She denies odynophagia and dysphagia.  Denies hematemesis, melena, hematochezia, diarrhea.  She also has some increased SOB.  She has lost 10-lbs in the last month.  In the ER, CT scan did not reveal any obvious etiology.  She is clearly more anemic which may be contributing to her shortness fo breath and fatigue.  Occult, however, was negative.  Work up for anemia is pending.  EGD on 2/15 demonstrated one small gastric body AVM and some mild pyloric channel edema, no old or fresh blood and no obvious explanation for the patient's nausea, vomiting, and weight loss.  Despite blood transfusion she continues to have some chest pressure and fatigue.  Her heart rate continues to remain in the low 40s.     Assessment/Plan  Chest pressure in patient with known CAD s/p CABG.  She had a coronary angiography 02/2014 which did not show any significant stenosis amenable to intervention.  Question if this could be related to her bradycardia.  She has sinus bradycardia that trends down to the 30s.  -  Continue aspirin -  Intolerant of statins due to fatigue/muslce weakness.  Continue zetia -  Increase imdur -  Hold BB -  Consider starting norvasc instead for BP control (currently having lower blood pressure) -  ECHO: Posterior basal hypokinesis with normal systolic function, ejection fraction of 50-55%.  Doppler parameters were consistent with elevated end-diastolic filling pressure. -  Trop neg  Fatigue, may be due to progressive anemia, poor appetite/weight loss, bradycardia -  D/c BB -  Transfuse 1 unit PRBC -  Maximize nutrition -  check a.m. cortisol tomorrow  Weight loss likely due to anorexia -  Nutrition consult -  Start supplements -  Liberalized diet  Sinus bradycardia to the 30s with sinus arrhythmia and occasional pauses -  D/c BB  Nausea vomiting may be related to gastritis/GERD or due to above -  Resume previous dose PPI since no evidence of gastritis or peptic ulcer disease -  Monitor kidney function on PPI -  GI cocktail  -  Zofran as needed. -  Appreciate gastroenterology assistance -  Gastric emptying study  HTN, blood pressure low normal  Constipation, continue Miralex.  AKI, resolving with IV fluids -  Repeat BMP in AM  Normocytic anemia, progressive -  Iron/B12/folate/TSH were within normal limits -  SPEP/UPEP pending -  Repeat occult stool  Globulin gap >> if MM, may explain her progressive anemia and kidney fx -  HCV negative, HIV pending, SPEP/UPEP pending  Desaturations at night -  outpateint sleep study -  Prn nightly O2  Progressive weakness and FTT -  PT/OT pending  Diet:  regular Access:  PIV IVF:  yes Proph:  heparin  Code Status: full Family Communication: patient and her daughter Disposition Plan: pending work up for chest pain, N/V, weight loss, and progressive anemia   Consultants:  Cardiology  Procedures:  ECHO  Antibiotics:  none   HPI/Subjective:  Ongoing chest pressure, anorexia.  Noticed no difference after blood transfusion.  Very weak and fatigued.    Objective: Filed Vitals:   05/22/14 0937 05/22/14 0940 05/22/14 0950 05/22/14 0957  BP: 146/57  151/44 156/52  Pulse:  50 56 54  Temp: 98.2 F (36.8 C)     TempSrc: Oral     Resp: 18 18 19 17   Height:      Weight:      SpO2: 100% 98% 93% 96%     Intake/Output Summary (Last 24 hours) at 05/22/14 1113 Last data filed at 05/22/14 0957  Gross per 24 hour  Intake   2605 ml  Output    550 ml  Net   2055 ml   Filed Weights   05/20/14 1644 05/21/14 0000 05/22/14 0400  Weight: 62.143 kg (137 lb) 61.553 kg (135 lb 11.2 oz) 66.18 kg (145 lb 14.4 oz)    Exam:   General:  CF, No acute distress, more color and cheeks  HEENT:  NCAT, MMM, nasal canula in place  Cardiovascular:  RRR, nl S1, S2, 2/6 systolic murmur, 2+ pulses, warm extremities  Respiratory:  CTAB, no increased WOB  Abdomen:   NABS, soft, NT/ND  MSK:   Normal tone and bulk, no LEE  Neuro:  Grossly intact  Data Reviewed: Basic Metabolic Panel:  Recent Labs Lab 05/20/14 1711 05/21/14 0920 05/22/14 0340  NA 134* 134* 137  K 3.3* 3.4* 3.4*  CL 96 99 103  CO2 28 26 31   GLUCOSE 117* 92 105*  BUN 16 14 13   CREATININE 1.55* 1.39* 1.24*  CALCIUM 8.1* 8.1* 7.6*   Liver Function Tests:  Recent Labs Lab 05/20/14 1711 05/21/14 0920  AST 29 22  ALT 13 11  ALKPHOS 51 42  BILITOT 0.9 0.7  PROT 8.5* 7.1  ALBUMIN 3.0* 2.4*    Recent Labs Lab 05/20/14 1711  LIPASE 27   No results for input(s): AMMONIA in the last 168 hours. CBC:  Recent Labs Lab 05/20/14 1710 05/21/14 0920 05/22/14 0340  WBC 4.0 2.9* 3.2*  NEUTROABS  --  1.6*  --   HGB 8.9* 7.6* 8.2*  HCT 26.8* 23.6* 25.5*  MCV 98.5 100.0 95.9  PLT 221 171 151   Cardiac Enzymes:  Recent Labs Lab 05/20/14 0110 05/21/14 0920  CKTOTAL 78  --   CKMB 0.8  --   TROPONINI 0.03 <0.03   BNP (last 3 results)  Recent Labs  05/20/14 1709  BNP 144.9*    ProBNP (last 3 results)  Recent Labs  07/19/13 1149 02/08/14 1727  PROBNP 299.3 121.3    CBG: No results for input(s): GLUCAP in the last 168 hours.  Recent Results (from the past 240 hour(s))  MRSA PCR Screening     Status: None   Collection Time: 05/21/14 12:22 AM  Result Value Ref Range Status   MRSA by PCR NEGATIVE  NEGATIVE Final    Comment:        The GeneXpert MRSA Assay (FDA approved for NASAL specimens only), is one component of a comprehensive MRSA colonization surveillance program. It is not intended to diagnose MRSA infection nor to guide or monitor treatment for MRSA infections.      Studies: Dg Chest 2 View (if Patient Has Fever And/or Copd)  05/20/2014   CLINICAL DATA:  Chest pain for 1 day.  Vomiting for 2 weeks  EXAM: CHEST  2 VIEW  COMPARISON:  May 11, 2014  FINDINGS: There is scarring in the lateral left base. There is no edema or consolidation. Heart is upper normal in size with pulmonary vascularity within normal limits. No pneumothorax. Patient is status post coronary artery bypass grafting. There is atherosclerotic change in the aorta. Prominence in the superior mediastinum is stable and felt to represent great vessel prominence in this age group. No bone lesions.  IMPRESSION: Mild scarring left base.  No edema or consolidation.   Electronically Signed   By: Lowella Grip III M.D.   On: 05/20/2014 17:36   Ct Chest W Contrast  05/20/2014   CLINICAL DATA:  81 year old female with chest and upper abdominal pain shortness of breath nausea vomiting. Initial encounter.  EXAM: CT CHEST, ABDOMEN, AND PELVIS WITH CONTRAST  TECHNIQUE: Multidetector CT imaging of the chest, abdomen and pelvis was performed following the standard protocol during bolus administration of intravenous contrast.  CONTRAST:  62mL OMNIPAQUE IOHEXOL 300 MG/ML  SOLN  COMPARISON:  [facility] Chest CT, CT Abdomen and Pelvis 04/09/2014.  FINDINGS: CT CHEST FINDINGS  Resolved airspace disease in the right lung since January. Stable lower lobe scarring or atelectasis greater on the left. Major airways are patent. No new pulmonary opacity.  Stable cardiomegaly. No pericardial or pleural effusion. Extensive atherosclerosis of the thoracic aorta. Coronary calcified plaque. Sequelae of CABG. No mediastinal lymphadenopathy.  Negative thoracic inlet.  Scoliosis. Degenerative changes in the spine. No acute osseous abnormality identified.  CT ABDOMEN AND PELVIS FINDINGS  No acute osseous abnormality identified.  No pelvic free fluid.  Diminutive bladder.  Sigmoid diverticulosis with indistinct appearance of the sigmoid colon today, evidence of wall thickening. Mild mesenteric stranding. Sigmoid also is inseparable from the left vaginal fornix (sagittal image 61), and there is a small volume of gas in the vaginal vault.  Diverticulosis decrease is in the left colon which is decompressed and not definitely inflamed. Decompressed transverse colon. Oral contrast has reached the right colon. The cecum is located in the pelvis. No dilated small bowel. Negative stomach. Small hiatal hernia. Small duodenum diverticulum.  Hepatic steatosis. Surgically absent gallbladder. Portal venous system within normal limits. Stable spleen, pancreas, adrenal glands and kidneys. Aortoiliac calcified atherosclerosis noted. Major arterial structures are patent. No abdominal free fluid. No lymphadenopathy.  IMPRESSION: 1. Resolved right lung pneumonia.  No new abnormality in the chest. 2. Sigmoid diverticulosis. No evidence of rupture or abscess. However, colovaginal fistulous possible. 3. Otherwise stable abdomen and pelvis.   Electronically Signed   By: Genevie Ann M.D.   On: 05/20/2014 21:33   US Abdomen Complete  05/20/2014   CLINICAL DATA:  81 year old female with upper abdominal pain with nausea. Prior cholecystectomy. Initial encounter.  EXAM: ULTRASOUND ABDOMEN COMPLETE  COMPARISON:  [facility] CT Abdomen and Pelvis 04/09/2014, and earlier  FINDINGS: Gallbladder: Surgically absent  Common bile duct: Diameter: 7 mm, stable and within normal limits in the post cholecystectomy state.  Liver: No focal lesion identified. Echogenicity at the upper limits of normal. No intrahepatic biliary ductal dilatation.  IVC: No abnormality visualized.  Pancreas:  Visualized portion unremarkable.  Spleen: Size and appearance within normal limits.  Right Kidney: Length: 9.7 cm. Echogenicity within normal limits. No mass or hydronephrosis visualized.  Left Kidney: Length: 9.3 cm. Several small simple renal cysts again noted. Echogenicity within normal limits. No mass or hydronephrosis visualized.  Abdominal aorta: No aneurysm visualized.  Other findings: None.  IMPRESSION: Negative abdomen ultrasound status postcholecystectomy.   Electronically Signed   By:  Genevie Ann M.D.   On: 05/20/2014 18:34   Ct Abdomen Pelvis W Contrast  05/20/2014   CLINICAL DATA:  81 year old female with chest and upper abdominal pain shortness of breath nausea vomiting. Initial encounter.  EXAM: CT CHEST, ABDOMEN, AND PELVIS WITH CONTRAST  TECHNIQUE: Multidetector CT imaging of the chest, abdomen and pelvis was performed following the standard protocol during bolus administration of intravenous contrast.  CONTRAST:  64mL OMNIPAQUE IOHEXOL 300 MG/ML  SOLN  COMPARISON:  [facility] Chest CT, CT Abdomen and Pelvis 04/09/2014.  FINDINGS: CT CHEST FINDINGS  Resolved airspace disease in the right lung since January. Stable lower lobe scarring or atelectasis greater on the left. Major airways are patent. No new pulmonary opacity.  Stable cardiomegaly. No pericardial or pleural effusion. Extensive atherosclerosis of the thoracic aorta. Coronary calcified plaque. Sequelae of CABG. No mediastinal lymphadenopathy. Negative thoracic inlet.  Scoliosis. Degenerative changes in the spine. No acute osseous abnormality identified.  CT ABDOMEN AND PELVIS FINDINGS  No acute osseous abnormality identified.  No pelvic free fluid.  Diminutive bladder.  Sigmoid diverticulosis with indistinct appearance of the sigmoid colon today, evidence of wall thickening. Mild mesenteric stranding. Sigmoid also is inseparable from the left vaginal fornix (sagittal image 61), and there is a small volume of gas in the vaginal vault.   Diverticulosis decrease is in the left colon which is decompressed and not definitely inflamed. Decompressed transverse colon. Oral contrast has reached the right colon. The cecum is located in the pelvis. No dilated small bowel. Negative stomach. Small hiatal hernia. Small duodenum diverticulum.  Hepatic steatosis. Surgically absent gallbladder. Portal venous system within normal limits. Stable spleen, pancreas, adrenal glands and kidneys. Aortoiliac calcified atherosclerosis noted. Major arterial structures are patent. No abdominal free fluid. No lymphadenopathy.  IMPRESSION: 1. Resolved right lung pneumonia.  No new abnormality in the chest. 2. Sigmoid diverticulosis. No evidence of rupture or abscess. However, colovaginal fistulous possible. 3. Otherwise stable abdomen and pelvis.   Electronically Signed   By: Genevie Ann M.D.   On: 05/20/2014 21:33    Scheduled Meds: . aspirin  325 mg Oral Daily  . ezetimibe  10 mg Oral QHS  . feeding supplement (ENSURE COMPLETE)  237 mL Oral BID BM  . heparin  5,000 Units Subcutaneous 3 times per day  . isosorbide mononitrate  60 mg Oral Daily  . pantoprazole  40 mg Oral BID AC  . polyethylene glycol  17 g Oral Daily  . traZODone  100 mg Oral QHS   Continuous Infusions: . sodium chloride 50 mL/hr (05/21/14 0049)    Principal Problem:   Chest pressure Active Problems:   GERD (gastroesophageal reflux disease)   COPD (chronic obstructive pulmonary disease)   Hypertension   CAD (coronary artery disease) of artery bypass graft, atretic LIMA   Nausea and vomiting   Protein-calorie malnutrition, severe   Normocytic anemia    Time spent: 30 min    Arseniy Toomey, [facility] Pager [phone]. If 7PM-7AM, please contact night-coverage at [email or web address], password [facility] 05/22/2014, 11:13 AM  LOS: 2 days

## 2014-05-22 NOTE — Op Note (Signed)
[facility] [place], [id number]   ENDOSCOPY PROCEDURE REPORT  PATIENT: [name], [name]  MR#: [id number] BIRTHDATE: [birth year] , 82  yrs. old GENDER: female ENDOSCOPIST: Arta Silence, MD REFERRED BY:  [facility] PROCEDURE DATE:  2014-05-24 PROCEDURE:  EGD, diagnostic ASA CLASS:     Class III INDICATIONS:  nausea and vomiting, abdominal distention, anemia, weight loss. MEDICATIONS: Monitored anesthesia care TOPICAL ANESTHETIC:  DESCRIPTION OF PROCEDURE: After the risks benefits and alternatives of the procedure were thoroughly explained, informed consent was obtained.  The Pentax Gastroscope [id number] endoscope was introduced through the mouth and advanced to the second portion of the duodenum. The instrument was slowly withdrawn as the mucosa was fully examined.   Findings:  Normal esophagus.  One small gastric body AVM; remainder of stomach, including retroflexion into the cardia, was normal. There was some mild pyloric channel edema, without overt mass or mucosal abnormalitiy identified, and through which the diagnostic endoscope passed without difficulty.  Small periampullary duodenal diverticulum; otherwise normal duodenum to the second portion.  No old or fresh blood was seen to the extent of our examination. The scope was then withdrawn from the patient and the procedure completed.  COMPLICATIONS: There were no immediate complications.  ENDOSCOPIC IMPRESSION:     As above.  No obvious explanation for patient's nausea, vomiting, weight loss was identified.  RECOMMENDATIONS:     1.  Watch for potential complications of procedure. 2.  Gastric emptying study. 3.  If gastric emptying study is unrevealing, might consider small bowel follow-through. 4.  Ideally, might consider colonoscopy to investigate patient's anemia further, but she is quite debilitated and I doubt she would be able to tolerate the bowel prep at the  present time. 5.  Will follow.  eSigned:  Arta Silence, MD 2014-05-24 9:47 AM   CC:  CPT CODES: ICD CODES:  The ICD and CPT codes recommended by this software are interpretations from the data that the clinical staff has captured with the software.  The verification of the translation of this report to the ICD and CPT codes and modifiers is the sole responsibility of the health care institution and practicing physician where this report was generated.  [employer] will not be held responsible for the validity of the ICD and CPT codes included on this report.  AMA assumes no liability for data contained or not contained herein. CPT is a Designer, television/film set of the [employer].

## 2014-05-22 NOTE — Transfer of Care (Signed)
Immediate Anesthesia Transfer of Care Note  Patient: [name]  Procedure(s) Performed: Procedure(s): ESOPHAGOGASTRODUODENOSCOPY (EGD) WITH PROPOFOL (Left)  Patient Location: PACU and Endoscopy Unit  Anesthesia Type:MAC  Level of Consciousness: awake, alert , oriented and patient cooperative  Airway & Oxygen Therapy: Patient Spontanous Breathing and Patient connected to face mask oxygen  Post-op Assessment: Report given to RN, Post -op Vital signs reviewed and stable, Patient moving all extremities and Patient moving all extremities X 4  Post vital signs: Reviewed and stable  Last Vitals:  Filed Vitals:   05/22/14 0937  BP:   Pulse:   Temp: 36.8 C  Resp:     Complications: No apparent anesthesia complications

## 2014-05-22 NOTE — Anesthesia Preprocedure Evaluation (Addendum)
Anesthesia Evaluation  Patient identified by MRN, date of birth, ID band Patient awake    Reviewed: Allergy & Precautions, NPO status , Patient's Chart, lab work & pertinent test results  Airway Mallampati: III  TM Distance: >3 FB Neck ROM: Full    Dental  (+) Edentulous Upper, Edentulous Lower   Pulmonary shortness of breath, COPD COPD inhaler, former smoker,  breath sounds clear to auscultation        Cardiovascular hypertension, + angina + CAD, + Past MI and + CABG Rhythm:Regular Rate:Bradycardia     Neuro/Psych Depression negative neurological ROS     GI/Hepatic Neg liver ROS, GERD-  ,  Endo/Other  negative endocrine ROS  Renal/GU ARFRenal disease     Musculoskeletal negative musculoskeletal ROS (+)   Abdominal   Peds  Hematology  (+) anemia ,   Anesthesia Other Findings   Reproductive/Obstetrics                            Anesthesia Physical Anesthesia Plan  ASA: III  Anesthesia Plan: MAC   Post-op Pain Management:    Induction: Intravenous  Airway Management Planned: Natural Airway and Nasal Cannula  Additional Equipment:   Intra-op Plan:   Post-operative Plan:   Informed Consent: I have reviewed the patients History and Physical, chart, labs and discussed the procedure including the risks, benefits and alternatives for the proposed anesthesia with the patient or authorized representative who has indicated his/her understanding and acceptance.   Dental advisory given  Plan Discussed with: CRNA  Anesthesia Plan Comments:         Anesthesia Quick Evaluation

## 2014-05-22 NOTE — Progress Notes (Addendum)
INITIAL NUTRITION ASSESSMENT  DOCUMENTATION CODES Per approved criteria  -Non-severe (moderate) malnutrition in the context of acute illness or injury   Pt meets criteria for moderate MALNUTRITION in the context of acute illness as evidenced by <75% of estimated energy intake x 7 days, mild fat and muscle depletion.  INTERVENTION: -Resource Breeze po TID, each supplement provides 250 kcal and 9 grams of protein -RD to follow for diet advanceent  NUTRITION DIAGNOSIS: Inadequate oral intake related to altered GI function as evidenced by clear liquids, poor po intake x 4 weeks PTA.   Goal: Pt will meet >90% of estimated nutritional needs  Monitor:  Diet advancement, PO/supplement intake, labs, weight changes, I/O's  Reason for Assessment: Consult to assess nutritional needs/ status, MST=2  81 y.o. female  Admitting Dx: Chest pressure  [name] is a 81 y.o. female whom we've been asked to see for the above reasons (nausea, vomiting, weight loss). She has had several months of intermittent post-prandial nausea and vomiting, which over the past couple weeks, has significantly worsened.   ASSESSMENT: Pt admitted with chest pressure.  S/p upper endoscopy this AM (05/22/14).  She reports a general decline in health over the past month, ever since recent discharge from a previous hospitalization at [facility]. She confirms poor po intake and weight loss over this time period, due to an inability to keep food down. She reports continued vomiting with poor intake PTA, however, she reveals that this has improved since hospitalization. Pt was eating clear liquid tray at time of visit and reported that she was able to tolerate well.  She reports UBW of 155#, which she last weighed about 6 weeks ago, per her report. However, this is not consistent with documented wt hx. She also reports that her clothes are much looser due to decreased muscle tone. She reveals that she was very active  up until about one month ago, but now spends most of her time in bed.  She reports she tried Boost and Ensure supplements PTA, however, was unable to tolerate them ("too hard on my stomach"). Pt is agreeable to try [employer]. Educated pt on good intake of food and supplements to support healing.  Labs reviewed. K: 3.4, Creat: 1.84, Calcium: 7.6, Glucose: 105.   Nutrition Focused Physical Exam:  Subcutaneous Fat:  Orbital Region: mild depletion Upper Arm Region: mild depletion Thoracic and Lumbar Region: WDL  Muscle:  Temple Region: mild depletion Clavicle Bone Region: WDL Clavicle and Acromion Bone Region: WDL Scapular Bone Region: WDL Dorsal Hand: WDL Patellar Region: mild deletion Anterior Thigh Region: mild depletion Posterior Calf Region: mild depleton  Edema: none present  Height: Ht Readings from Last 1 Encounters:  05/22/14 5\' 2"  (1.575 m)    Weight: Wt Readings from Last 1 Encounters:  05/22/14 145 lb 14.4 oz (66.18 kg)    Ideal Body Weight: 110#  % Ideal Body Weight: 131%  Wt Readings from Last 10 Encounters:  05/22/14 145 lb 14.4 oz (66.18 kg)  04/18/14 148 lb (67.132 kg)  03/09/14 151 lb (68.493 kg)  02/11/14 147 lb 4.8 oz (66.815 kg)  01/26/13 138 lb (62.596 kg)  02/06/12 146 lb 6.2 oz (66.4 kg)    Usual Body Weight: 155#  % Usual Body Weight: 94%  BMI:  Body mass index is 26.68 kg/(m^2). Overweight  Estimated Nutritional Needs: Kcal: 1600-1800 Protein: 72-82 grams Fluid: 1.6-1.8 L  Skin: WDL  Diet Order: Diet clear liquid  EDUCATION NEEDS: -Education needs addressed  Intake/Output Summary (Last 24 hours) at 05/22/14 1057 Last data filed at 05/22/14 0957  Gross per 24 hour  Intake   2845 ml  Output    550 ml  Net   2295 ml    Last BM: 05/19/14  Labs:   Recent Labs Lab 05/20/14 1711 05/21/14 0920 05/22/14 0340  NA 134* 134* 137  K 3.3* 3.4* 3.4*  CL 96 99 103  CO2 28 26 31   BUN 16 14 13   CREATININE 1.55* 1.39*  1.24*  CALCIUM 8.1* 8.1* 7.6*  GLUCOSE 117* 92 105*    CBG (last 3)  No results for input(s): GLUCAP in the last 72 hours.  Scheduled Meds: . aspirin  325 mg Oral Daily  . ezetimibe  10 mg Oral QHS  . feeding supplement (ENSURE COMPLETE)  237 mL Oral BID BM  . heparin  5,000 Units Subcutaneous 3 times per day  . isosorbide mononitrate  60 mg Oral Daily  . pantoprazole  40 mg Oral BID AC  . polyethylene glycol  17 g Oral Daily  . potassium chloride  40 mEq Oral Once  . traZODone  100 mg Oral QHS    Continuous Infusions: . sodium chloride 50 mL/hr (05/21/14 0049)    Past Medical History  Diagnosis Date  . COPD (chronic obstructive pulmonary disease)   . Gout   . Coronary atherosclerosis of native coronary artery 02/05/2012  . Hypertension   . High blood cholesterol   . Heart murmur     "all my life" (02/05/2012)  . Anginal pain   . Myocardial infarction 1990's; 2000's    "2 total, I think" (02/05/2012)  . Pneumonia     "several times" (02/05/2012)  . Chronic bronchitis   . Shortness of breath     "sometimes when I'm laying down; always when I do too much" (02/05/2012)  . History of blood transfusion   . External bleeding hemorrhoids     "act up at times" (02/05/2012)  . Chronic back pain     "all over" (02/05/2012)  . Depression   . CAD (coronary artery disease) of artery bypass graft 02/09/14    atretic LIMA    Past Surgical History  Procedure Laterality Date  . Appendectomy  ? 1970's  . Ectopic pregnancy surgery  1970's  . Abdominal hysterectomy  ? 1970's    "partial 1st time; complete 2nd" (02/05/2012)  . Cholecystectomy  ~ 2010  . Cataract extraction w/ intraocular lens  implant, bilateral  [phone]  . Coronary angioplasty with stent placement  2005  . Coronary artery bypass graft  2005    CABG X2  . Cardiac catheterization  02/09/14    atretic LIMA,  patent VG-dRCA and Total occlusion of the native RCA proximally prior to remotely placement RCA  stent, with mild proximal 30% LAD stenosis and 30% distal circumflex stenoses.  EF 55%.  . Left heart catheterization with coronary/graft angiogram N/A 02/09/2014    Procedure: LEFT HEART CATHETERIZATION WITH Beatrix Fetters;  Surgeon: Troy Sine, MD;  Location: [facility] C[facility] LAB;  Service: Cardiovascular;  Laterality: N/A;    Townsend Cudworth A. Jimmye Norman, RD, LDN, CDE Pager: [phone] After hours Pager: [phone]

## 2014-05-22 NOTE — Progress Notes (Signed)
UR completed 

## 2014-05-22 NOTE — Progress Notes (Signed)
PT Cancellation Note  Patient Details Name: [name] MRN: [id number] DOB: 1931-07-31   Cancelled Treatment:    Reason Eval/Treat Not Completed: Patient at procedure or test/unavailable (Pt in endoscopy this am.  Will return as able.  Thanks.)   Irwin Brakeman F 05/22/2014, 8:34 AM  [facility] [phone] (pager)

## 2014-05-23 ENCOUNTER — Encounter (HOSPITAL_COMMUNITY): Payer: Self-pay | Admitting: Gastroenterology

## 2014-05-23 ENCOUNTER — Observation Stay (HOSPITAL_COMMUNITY): Payer: Medicare Other

## 2014-05-23 DIAGNOSIS — I10 Essential (primary) hypertension: Secondary | ICD-10-CM

## 2014-05-23 DIAGNOSIS — D649 Anemia, unspecified: Secondary | ICD-10-CM

## 2014-05-23 DIAGNOSIS — E440 Moderate protein-calorie malnutrition: Secondary | ICD-10-CM

## 2014-05-23 DIAGNOSIS — R0789 Other chest pain: Secondary | ICD-10-CM

## 2014-05-23 DIAGNOSIS — R112 Nausea with vomiting, unspecified: Secondary | ICD-10-CM

## 2014-05-23 LAB — BASIC METABOLIC PANEL
Anion gap: 9 (ref 5–15)
BUN: 13 mg/dL (ref 6–23)
CO2: 24 mmol/L (ref 19–32)
Calcium: 7.8 mg/dL — ABNORMAL LOW (ref 8.4–10.5)
Chloride: 104 mmol/L (ref 96–112)
Creatinine, Ser: 1.1 mg/dL (ref 0.50–1.10)
GFR, EST AFRICAN AMERICAN: 53 mL/min — AB (ref >90)
GFR, EST NON AFRICAN AMERICAN: 46 mL/min — AB (ref >90)
GLUCOSE: 93 mg/dL (ref 70–99)
POTASSIUM: 3.9 mmol/L (ref 3.5–5.1)
SODIUM: 137 mmol/L (ref 135–145)

## 2014-05-23 LAB — CBC
HCT: 25.1 % — ABNORMAL LOW (ref 36.0–46.0)
Hemoglobin: 8.1 g/dL — ABNORMAL LOW (ref 12.0–15.0)
MCH: 30.7 pg (ref 26.0–34.0)
MCHC: 32.3 g/dL (ref 30.0–36.0)
MCV: 95.1 fL (ref 78.0–100.0)
PLATELETS: 148 10*3/uL — AB (ref 150–400)
RBC: 2.64 MIL/uL — ABNORMAL LOW (ref 3.87–5.11)
RDW: 20.4 % — AB (ref 11.5–15.5)
WBC: 3.5 10*3/uL — AB (ref 4.0–10.5)

## 2014-05-23 LAB — BRAIN NATRIURETIC PEPTIDE: B NATRIURETIC PEPTIDE 5: 537.1 pg/mL — AB (ref 0.0–100.0)

## 2014-05-23 LAB — PREPARE RBC (CROSSMATCH)

## 2014-05-23 LAB — HIV ANTIBODY (ROUTINE TESTING W REFLEX): HIV Screen 4th Generation wRfx: NONREACTIVE

## 2014-05-23 MED ORDER — TECHNETIUM TC 99M SULFUR COLLOID
2.0000 | Freq: Once | INTRAVENOUS | Status: AC | PRN
  Administered 2014-05-23: 2 via INTRAVENOUS

## 2014-05-23 MED ORDER — SODIUM CHLORIDE 0.9 % IV SOLN
Freq: Once | INTRAVENOUS | Status: DC
Start: 2014-05-23 — End: 2014-05-25

## 2014-05-23 NOTE — Progress Notes (Signed)
[facility] PROGRESS NOTE Subjective patient had gastric emptying scan today show normal gastric emptying. She states that today she is not having any nausea and vomiting and actually feels pretty good  Objective: Vital signs in last 24 hours: Temp:  [97.8 F (36.6 C)-98.6 F (37 C)] 98.6 F (37 C) (02/16 1650) Pulse Rate:  [48-66] 48 (02/16 1650) Resp:  [16-18] 18 (02/16 1650) BP: (102-151)/(43-65) 121/65 mmHg (02/16 1650) SpO2:  [93 %-97 %] 95 % (02/16 1650) Weight:  [62.9 kg (138 lb 10.7 oz)] 62.9 kg (138 lb 10.7 oz) (02/16 0510) Last BM Date: 05/22/14  Intake/Output from previous day: 02/15 0701 - 02/16 0700 In: 350 [I.V.:350] Out: 350 [Urine:350] Intake/Output this shift: Total I/O In: 120 [P.O.:120] Out: 200 [Urine:200]  PE: General--eating supper ate approximately 1/3 of the food on her plate  Abdomen-- soft completely nontender  Lab Results:  Recent Labs  05/21/14 0920 05/22/14 0340 05/23/14 0333  WBC 2.9* 3.2* 3.5*  HGB 7.6* 8.2* 8.1*  HCT 23.6* 25.5* 25.1*  PLT 171 151 148*   BMET  Recent Labs  05/21/14 0920 05/22/14 0340 05/23/14 0333  NA 134* 137 137  K 3.4* 3.4* 3.9  CL 99 103 104  CO2 26 31 24   CREATININE 1.39* 1.24* 1.10   LFT  Recent Labs  05/21/14 0920  PROT 7.1  AST 22  ALT 11  ALKPHOS 42  BILITOT 0.7   PT/INR  Recent Labs  05/21/14 0920  LABPROT 15.2  INR 1.18   PANCREAS No results for input(s): LIPASE in the last 72 hours.       Studies/Results: Nm Gastric Emptying  05/23/2014   CLINICAL DATA:  Pain and reflux.  EXAM: NUCLEAR MEDICINE GASTRIC EMPTYING SCAN  TECHNIQUE: After oral ingestion of radiolabeled meal, sequential abdominal images were obtained for 4 hours. Percentage of activity emptying the stomach calculated at 1 hour, 2 hour, 3 hour, and 4 hours.  RADIOPHARMACEUTICALS:  2.0 mCi technetium 99-m labeled sulfur colloid  COMPARISON:  None.  FINDINGS: Expected location of the stomach in the left  upper quadrant. Ingested meal empties the stomach gradually over the course of the study.  92% emptied at 4 hr ( normal >= 90%)  IMPRESSION: Normal gastric emptying study.   Electronically Signed   By: Marcello Moores  Register   On: 05/23/2014 13:42    Medications: I have reviewed the patient's current medications.  Assessment/Plan: 1. Nausea and vomiting. Patient denies symptoms currently and appears to be able to eat food on her plate. Her abdomen is nontender. Gastric emptying scan is normal. EGD was fairly nonremarkable. Not really sure why she is improved but will go ahead and recheck her tomorrow   Sammie Schermerhorn JR,Zacharie Portner L 05/23/2014, 5:42 PM

## 2014-05-23 NOTE — Clinical Social Work Psychosocial (Signed)
     Clinical Social Work Department BRIEF PSYCHOSOCIAL ASSESSMENT 05/23/2014  Patient:  [name], [name]     Account Number:  [id number]     Admit date:  05/20/2014  Clinical Social Worker:  Adair Laundry  Date/Time:  05/23/2014 05:12 PM  Referred by:  RN  Date Referred:  05/23/2014 Referred for  SNF Placement   Other Referral:   Interview type:  Patient Other interview type:   Spoke with pt and pt daughter at bedside    PSYCHOSOCIAL DATA Living Status:  WITH ADULT CHILDREN Admitted from facility:   Level of care:   Primary support name:  Mauricio Po Primary support relationship to patient:  CHILD, ADULT Degree of support available:   Pt has strong family support    CURRENT CONCERNS Current Concerns  Post-Acute Placement   Other Concerns:    SOCIAL WORK ASSESSMENT / PLAN CSW notified that family is requesting to speak to [place]. CSW visited pt room. Pt laying comfortably in bed and pt daughter seated at bedside. CSW introduced self. 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 Butch Penny wondering if there are any options covered under insurance to pay for pt to have supervision while she is at work. CSW explained that this is not an option. CSW reviewed private pay but Butch Penny explained this is not feasible for pt or family. CSW explained [facility] rehab at [facility]. Pt and pt expressed good understanding of pt medical condition and need for SNF. However, both were visibly upset and pt daughter crying during discussion. Pt daughter expressed feeling of guilt for having to have pt go to SNF when she knows pt does not want this. CSW offered support. CSW explained SNF referral process. Pt and pt daughter after much discussion would like for CSW to send referral to [facility] and [facility]. Pt and pt daughter unfamiliar with facilities and seemed overwhelmed by process. CSW once again  offered support and reviewed entire SNF process with pt and pt daughter. CSW explained that while CSW cannot offer recommendations CSW will assist with each step and finding a facility for pt. Pt and pt daughter thankful and visibly less emotional.   Assessment/plan status:  Psychosocial Support/Ongoing Assessment of Needs Other assessment/ plan:   Information/referral to community resources:   SNF list provided    PATIENTS/FAMILYS RESPONSE TO PLAN OF CARE: Pt and pt daughter tearful during conversation but in agreement that SNF will be safest option at dc.      [place], [place]

## 2014-05-23 NOTE — Evaluation (Signed)
Occupational Therapy Evaluation Patient Details Name: [name] MRN: [id number] DOB: 05/27/1931 Today's Date: 05/23/2014    History of Present Illness Pt admit with chest pressure.  Bradycardia and nausea and vomiting as well.  Gastritis and anemia.    Clinical Impression   Pt admitted as above and currently demonstrates deficits in ability to perform ADL's and functional mobility/transfers related to ADL's (see OT problem list below). She should benefit from acute OT followed by [facility] and 24/hr A PRN to address deficits and assist w/ independence related to ADL's and functional mobility. Pt is open to [facility] as discussed today.    Follow Up Recommendations  Home health OT;Supervision/Assistance - 24 hour    Equipment Recommendations  Other (comment) (Cont to assess, pt currently reports no needs at this time)    Recommendations for Other Services       Precautions / Restrictions Precautions Precautions: Fall Restrictions Weight Bearing Restrictions: No      Mobility Bed Mobility Overal bed mobility: Independent                Transfers Overall transfer level: Modified independent Equipment used: 1 person hand held assist                  Balance Overall balance assessment:  (Pt states that she "holds onto things at home when I walk in the house" Has RW but does not use)                                          ADL Overall ADL's : Needs assistance/impaired     Grooming: Wash/dry hands;Wash/dry face;Brushing hair;Set up;Sitting Grooming Details (indicate cue type and reason): Sitting EOB, fatigues easily, benefits from rest breaks Upper Body Bathing: Set up;Sitting Upper Body Bathing Details (indicate cue type and reason): Min A to wash/dry back Lower Body Bathing: Supervison/ safety;Set up;Sit to/from stand   Upper Body Dressing : Set up;Sitting   Lower Body Dressing: Set up;Sit to/from stand;Supervision/safety   Toilet  Transfer: Min guard;Ambulation;Grab bars Toilet Transfer Details (indicate cue type and reason): Hand held assist, pt tends to reach for objects in room, sink/countertop, door frame etc. Has RW at home but does not use Toileting- Clothing Manipulation and Hygiene: Supervision/safety;Sit to/from stand       Functional mobility during ADLs: Min guard (Has RW in room and at home but does not use, pt states that she reaches for things and "holds onto things wround the house" +1 HHA) General ADL Comments: Pt was educated in role of OT and participated in ADL retraining session today. Sitting at EOB for bathing and dressing followed by toileting and brief stand at sink <3 min for grooming before needing to sit secondary to fatigue. She should benefit from acute OT followed by [facility] and 24/hr A PRN to address deficits and assist w/ independence related to ADL's and functional mobility.     Vision  Pt wears glasses at all times, no changes from baseline per her report.   Perception     Praxis      Pertinent Vitals/Pain Pain Assessment: No/denies pain     Hand Dominance Right   Extremity/Trunk Assessment Upper Extremity Assessment Upper Extremity Assessment: Generalized weakness   Lower Extremity Assessment Lower Extremity Assessment: Generalized weakness;Defer to PT evaluation   Cervical / Trunk Assessment Cervical / Trunk Assessment: Normal   Communication Communication  Communication: HOH   Cognition Arousal/Alertness: Awake/alert Behavior During Therapy: WFL for tasks assessed/performed Overall Cognitive Status: Within Functional Limits for tasks assessed                     General Comments       Exercises       Shoulder Instructions      Home Living Family/patient expects to be discharged to:: Private residence Living Arrangements: Children (Lives w/ daughter who works) Available Help at Discharge: Available PRN/intermittently;Family Type of Home: Mobile  home Home Access: Stairs to enter Entrance Stairs-Number of Steps: 4 Entrance Stairs-Rails: Right;Left;Can reach both Home Layout: One level     Bathroom Shower/Tub: Teacher, early years/pre: Standard     Home Equipment: Environmental consultant - 2 wheels;Tub bench          Prior Functioning/Environment Level of Independence: Independent;Needs assistance  Gait / Transfers Assistance Needed: Walked by herself ADL's / Homemaking Assistance Needed: uses shower bench but can bathe & dress herself   Comments: Pt did wash and made beds etc.    OT Diagnosis: Generalized weakness   OT Problem List: Decreased strength;Decreased activity tolerance;Decreased knowledge of use of DME or AE;Cardiopulmonary status limiting activity   OT Treatment/Interventions: Self-care/ADL training;Energy conservation;DME and/or AE instruction;Patient/family education;Therapeutic activities;Therapeutic exercise    OT Goals(Current goals can be found in the care plan section) Acute Rehab OT Goals Patient Stated Goal: Get better and go home Time For Goal Achievement: 06/06/14 Potential to Achieve Goals: Good  OT Frequency: Min 2X/week   Barriers to D/C:            Co-evaluation              End of Session Equipment Utilized During Treatment: Gait belt Nurse Communication: Mobility status;Other (comment) (ADL completed this am)  Activity Tolerance: Patient limited by fatigue Patient left: in bed;with call bell/phone within reach;with bed alarm set;with nursing/sitter in room   Time: [id number] OT Time Calculation (min): 34 min Charges:  OT General Charges $OT Visit: 1 Procedure OT Evaluation $Initial OT Evaluation Tier I: 1 Procedure OT Treatments $Self Care/Home Management : 23-37 mins (24 min) G-Codes: OT G-codes **NOT FOR INPATIENT CLASS** Functional Assessment Tool Used: Clinical Judgement Functional Limitation: Self care Self Care Current Status (P5465): At least 1 percent but less than  20 percent impaired, limited or restricted Self Care Goal Status (K8127): At least 1 percent but less than 20 percent impaired, limited or restricted  Almyra Deforest, OTR/L 05/23/2014, 8:52 AM

## 2014-05-23 NOTE — Progress Notes (Signed)
Name: [name] is a 81 y.o. female Admit date: 05/20/2014   Referring Physician:  Jerilynn Mages. Short, MD Primary Physician:  K. Lysle Rubens, MD Primary Cardiologist:  HWBSmith, MD  Reason for Consultation:  Bradycardia  ASSESSMENT: 1. Bradycardia, possibly related to sick sinus syndrome. No overt symptoms or severe enough brady to warrant pacemaker at this time. 2. Coronary artery disease and prior CABG with recent coronary angio documenting atretic LIMA and patent SVG to RCA.[name] No new ischemic symptoms, ECG changes, or marker evidence of ACS. 3. Nausea, weight loss and epigastric discomfort 4. Severe anemia  PLAN: 1. Discontinue beta blocker therapy. Only taking metoprolol 12.5 mg daily. 2. No need for ischemic re-evaluation given current data base. 3. Decrease aspirin to 81 mg daily.   HPI: Asked to see 81 year old well known to me admitted with weakness, nausea, and epigastric discomfort. Had hospital stay for chest pain in November 2015 with coronary angio demonstrating atretic LIMA to LAD, patent LAD, and circumflex with widely patent SVG to RCA>. Had Upper endoscopy this admission with no findings to explain complaints. Normal gastric emptying study today.  PMH:   Past Medical History  Diagnosis Date  . COPD (chronic obstructive pulmonary disease)   . Gout   . Coronary atherosclerosis of native coronary artery 02/05/2012  . Hypertension   . High blood cholesterol   . Heart murmur     "all my life" (02/05/2012)  . Anginal pain   . Myocardial infarction 1990's; 2000's    "2 total, I think" (02/05/2012)  . Pneumonia     "several times" (02/05/2012)  . Chronic bronchitis   . Shortness of breath     "sometimes when I'm laying down; always when I do too much" (02/05/2012)  . History of blood transfusion   . External bleeding hemorrhoids     "act up at times" (02/05/2012)  . Chronic back pain     "all over" (02/05/2012)  . Depression   . CAD (coronary artery disease) of  artery bypass graft 02/09/14    atretic LIMA    PSH:   Past Surgical History  Procedure Laterality Date  . Appendectomy  ? 1970's  . Ectopic pregnancy surgery  1970's  . Abdominal hysterectomy  ? 1970's    "partial 1st time; complete 2nd" (02/05/2012)  . Cholecystectomy  ~ 2010  . Cataract extraction w/ intraocular lens  implant, bilateral  [phone]  . Coronary angioplasty with stent placement  2005  . Coronary artery bypass graft  2005    CABG X2  . Cardiac catheterization  02/09/14    atretic LIMA,  patent VG-dRCA and Total occlusion of the native RCA proximally prior to remotely placement RCA stent, with mild proximal 30% LAD stenosis and 30% distal circumflex stenoses.  EF 55%.  . Left heart catheterization with coronary/graft angiogram N/A 02/09/2014    Procedure: LEFT HEART CATHETERIZATION WITH Beatrix Fetters;  Surgeon: Troy Sine, MD;  Location: [facility] CATH LAB;  Service: Cardiovascular;  Laterality: N/A;  . Esophagogastroduodenoscopy (egd) with propofol Left 05/22/2014    Procedure: ESOPHAGOGASTRODUODENOSCOPY (EGD) WITH PROPOFOL;  Surgeon: Arta Silence, MD;  Location: [facility] ENDOSCOPY;  Service: Endoscopy;  Laterality: Left;   Allergies:  Lexapro; Soma; Wellbutrin; Albuterol sulfate; Codeine; Levaquin; and Plavix Prior to Admit Meds:   Prescriptions prior to admission  Medication Sig Dispense Refill Last Dose  . albuterol-ipratropium (COMBIVENT) 18-103 MCG/ACT inhaler Inhale 1-2 puffs into the lungs every 4 (four) hours as needed for wheezing or  shortness of breath.   Unk  . ALPRAZolam (XANAX) 0.5 MG tablet Take 0.25 mg by mouth daily as needed for anxiety.   0 05/19/2014 at Unknown time  . aspirin 325 MG EC tablet Take 325 mg by mouth daily.   05/20/2014 at Unknown time  . cholecalciferol (VITAMIN D) 1000 UNITS tablet Take 1,000 Units by mouth daily.   couple days ago  . ferrous gluconate (FERGON) 324 MG tablet Take 324 mg by mouth daily with breakfast.   05/20/2014 at  Unknown time  . hydrochlorothiazide (HYDRODIURIL) 25 MG tablet Take 25 mg by mouth daily.   05/20/2014 at Unknown time  . isosorbide mononitrate (IMDUR) 30 MG 24 hr tablet Take 1 tablet (30 mg total) by mouth daily. 30 tablet 11 05/20/2014 at Unknown time  . Melatonin 5 MG CAPS Take 1 capsule by mouth daily as needed (for sleep).   Unk  . metoprolol succinate (TOPROL-XL) 25 MG 24 hr tablet Take 12.5 mg by mouth daily.   05/20/2014 at 8:30 or 9am  . nitroGLYCERIN (NITROSTAT) 0.4 MG SL tablet Place 0.4 mg under the tongue every 5 (five) minutes as needed for chest pain.   several months ago  . Omega-3 Fatty Acids (FISH OIL) 1200 MG CAPS Take 1,200-2,400 mg by mouth 2 (two) times daily. Take 2 capsules (2400 mg) every morning and 1 capsule (1200 mg) every night   05/20/2014 at am  . ondansetron (ZOFRAN) 8 MG tablet Take 8 mg by mouth every 8 (eight) hours as needed for nausea or vomiting.   Past Month at Unknown time  . pantoprazole (PROTONIX) 40 MG tablet Take 40 mg by mouth daily.  0 Unk  . polyethylene glycol powder (GLYCOLAX/MIRALAX) powder Take 2 Containers by mouth at bedtime. Mix with 8 oz liquid and drink  0 05/19/2014 at Unknown time  . Potassium Gluconate 595 MG CAPS Take 1 capsule by mouth daily.   Unk  . RA SENNA 8.6 MG tablet Take 1 tablet by mouth daily as needed for constipation.   0 05/19/2014 at Unknown time  . tiotropium (SPIRIVA) 18 MCG inhalation capsule Place 18 mcg into inhaler and inhale daily as needed (shortness of breath).    Past Week at Unknown time  . traMADol (ULTRAM) 50 MG tablet Take 100 mg by mouth every 6 (six) hours as needed (pain). For pain   05/19/2014 at Unknown time  . traZODone (DESYREL) 100 MG tablet Take 100 mg by mouth at bedtime.  0 Unk  . ZETIA 10 MG tablet Take 10 mg by mouth daily.  1 Past Week at Unknown time  . loratadine (CLARITIN) 10 MG tablet Take 1 tablet (10 mg total) by mouth daily as needed for allergies, rhinitis or itching. (Patient not taking:  Reported on 05/20/2014)   Not Taking at Unknown time  . metoprolol succinate (TOPROL-XL) 12.5 mg TB24 24 hr tablet Take 0.5 tablets (12.5 mg total) by mouth daily. (Patient not taking: Reported on 05/20/2014) 30 tablet 5 Not Taking at Unknown time   Fam HX:    Family History  Problem Relation Age of Onset  . Anemia Neg Hx   . Arrhythmia Neg Hx   . Asthma Neg Hx   . Clotting disorder Neg Hx   . Fainting Neg Hx   . Heart attack Neg Hx   . Heart disease Neg Hx   . Heart failure Neg Hx   . Hyperlipidemia Neg Hx   . Hypertension Neg Hx   .  Aneurysm    . CVA    . CVA Mother   . Aneurysm Father    Social HX:    History   Social History  . Marital Status: Widowed    Spouse Name: N/A  . Number of Children: N/A  . Years of Education: N/A   Occupational History  . Not on file.   Social History Main Topics  . Smoking status: Former Smoker -- 1.00 packs/day for 50 years    Types: Cigarettes    Quit date: 04/02/2011  . Smokeless tobacco: Never Used  . Alcohol Use: Yes     Comment: 02/05/2012 "have a drink of wine ~ q 6 months"  . Drug Use: No  . Sexual Activity: Not Currently   Other Topics Concern  . Not on file   Social History Narrative     Review of Systems: Denies orthopnea, PND, edema, syncope, transient neurological complaints, palpitations, hemoptysis, melena, BRBPR, and angina.  Physical Exam: Blood pressure 151/55, pulse 53, temperature 97.8 F (36.6 C), temperature source Oral, resp. rate 16, height 5\' 2"  (1.575 m), weight 138 lb 10.7 oz (62.9 kg), SpO2 93 %. Weight change: -7 lb 3.7 oz (-3.28 kg)   Skin is pale HEENT without jaundice and no JVD Chest clear Cardiac reveals irregular rhythm, soft systolic murmur, and S4 gallop.  No rub is heard. Abdomen is soft and bowel sound are normal. Extremities reveal no edema. Radial and pedal pulses are palpable. Neurologic reveals no significant abnormalities.    Labs: Lab Results  Component Value Date   WBC 3.5*  05/23/2014   HGB 8.1* 05/23/2014   HCT 25.1* 05/23/2014   MCV 95.1 05/23/2014   PLT 148* 05/23/2014    Recent Labs Lab 05/21/14 0920  05/23/14 0333  NA 134*  < > 137  K 3.4*  < > 3.9  CL 99  < > 104  CO2 26  < > 24  BUN 14  < > 13  CREATININE 1.39*  < > 1.10  CALCIUM 8.1*  < > 7.8*  PROT 7.1  --   --   BILITOT 0.7  --   --   ALKPHOS 42  --   --   ALT 11  --   --   AST 22  --   --   GLUCOSE 92  < > 93  < > = values in this interval not displayed. No results found for: PTT Lab Results  Component Value Date   INR 1.18 05/21/2014   INR 1.07 05/20/2014   INR 1.08 02/08/2014   Lab Results  Component Value Date   CKTOTAL 78 05/20/2014   CKMB 0.8 05/20/2014   TROPONINI <0.03 05/21/2014     Lab Results  Component Value Date   CHOL 137 02/09/2014   CHOL 147 02/06/2012   CHOL  03/03/2008    115        ATP III CLASSIFICATION:  <200     mg/dL   Desirable  200-239  mg/dL   Borderline High  >=240    mg/dL   High   Lab Results  Component Value Date   HDL 27* 02/09/2014   HDL 32* 02/06/2012   HDL 14* 03/03/2008   Lab Results  Component Value Date   LDLCALC 80 02/09/2014   [place] 74 02/06/2012   LDLCALC  03/03/2008    82        Total Cholesterol/HDL:CHD Risk Coronary Heart Disease Risk Table  Men   Women  1/2 Average Risk   3.4   3.3   Lab Results  Component Value Date   TRIG 150* 02/09/2014   TRIG 207* 02/06/2012   TRIG 96 03/03/2008   Lab Results  Component Value Date   CHOLHDL 5.1 02/09/2014   CHOLHDL 4.6 02/06/2012   CHOLHDL 8.2 03/03/2008   No results found for: LDLDIRECT    Radiology:  Nm Gastric Emptying  05/23/2014   CLINICAL DATA:  Pain and reflux.  EXAM: NUCLEAR MEDICINE GASTRIC EMPTYING SCAN  TECHNIQUE: After oral ingestion of radiolabeled meal, sequential abdominal images were obtained for 4 hours. Percentage of activity emptying the stomach calculated at 1 hour, 2 hour, 3 hour, and 4 hours.  RADIOPHARMACEUTICALS:  2.0 mCi  technetium 99-m labeled sulfur colloid  COMPARISON:  None.  FINDINGS: Expected location of the stomach in the left upper quadrant. Ingested meal empties the stomach gradually over the course of the study.  92% emptied at 4 hr ( normal >= 90%)  IMPRESSION: Normal gastric emptying study.   Electronically Signed   By: Marcello Moores  Register   On: 05/23/2014 13:42    EKG:  NSR with PAC's vs wandering atrial pacer rhythm. Lateral TWA but improved c/w 02/2014.    Sinclair Grooms 05/23/2014 2:25 PM

## 2014-05-23 NOTE — Progress Notes (Signed)
[facility] PROGRESS NOTE  [name] VZD:[id number] DOB: 19-Oct-1931 DOA: 05/20/2014 PCP: [name], MD  Brief Summary  81 y.o. female with Past medical history of COPD, recent treatment for pneumonia, coronary artery disease with CABG with cath 02/2014, hypertension, dyslipidemia, GERD.  The patient is presents with chest and epigastric pain with nausea and vomiting for last month.  She was hospitalized about a month ago for pneumonia and since then she has had no appetite, forcing herself to eat and drink.  She has constant chest pressure and is not sure if anything makes it better or worse.  She denies odynophagia and dysphagia.  Denies hematemesis, melena, hematochezia, diarrhea.  She also has some increased SOB.  She has lost 10-lbs in the last month.  In the ER, CT scan did not reveal any obvious etiology.  She is clearly more anemic which may be contributing to her shortness fo breath and fatigue.  Occult, however, was negative.  Work up for anemia has so far been unremarkable, no vitamin or iron deficiencies.  I'm worried she may have multiple myeloma and SPEP/UPEP are pending.  EGD on 2/15 demonstrated one small gastric body AVM and some mild pyloric channel edema, no old or fresh blood. Gastric emptying study was also normal with no obvious explanation for the patient's nausea, vomiting, and weight loss.  Her heart rate continues to remain in the low 40s but cardiology does not feel this is contributing to her symptoms.  Going to transfuse her another unit today and see if that helps her nausea and fatigue.  If nausea persists, next step would probably be MRI brain to exclude stroke or tumor.    Assessment/Plan  Chest pressure in patient with known CAD s/p CABG.  She had a coronary angiography 02/2014 which did not show any significant stenosis amenable to intervention.  Question if this could be related to her bradycardia.  She has sinus bradycardia that trends down to the 30s.   -  Continue aspirin -  Intolerant of statins due to fatigue/muslce weakness.  Continue zetia -  Continue increased imdur -  Continue to hold BB -  Consider starting norvasc instead for BP control (currently having lower blood pressure) -  ECHO: Posterior basal hypokinesis with normal systolic function, ejection fraction of 50-55%. Doppler parameters were consistent with elevated end-diastolic filling pressure. -  Trop neg -  Appreciate cardiology assistance  Fatigue, may be due to progressive anemia, poor appetite/weight loss, bradycardia -  D/c BB -  Transfuse additional unit of blood today  -  Maximize nutrition -  check a.m. cortisol tomorrow  Weight loss likely due to anorexia -  Nutrition consult -  Start supplements -  Liberalized diet  Sinus bradycardia to the 30s with sinus arrhythmia and occasional pauses -  D/c BB -  Appreciate cardiology assistance  Nausea vomiting may be related to gastritis/GERD or due to above -  Resume previous dose PPI since no evidence of gastritis or peptic ulcer disease -  Monitor kidney function on PPI -  GI cocktail  -  Zofran as needed. -  Appreciate gastroenterology assistance -  Gastric emptying study:  Normal -  EGD:  No explanation for symptoms  HTN, blood pressure low normal  Constipation, continue Miralex.  AKI, resolved with IV fluids  Normocytic anemia, progressive -  Iron/B12/folate/TSH were within normal limits -  SPEP/UPEP pending -  Repeat occult stool neg -  EGD with one small nonbleeding AVM  Globulin gap >>  if MM, may explain her progressive anemia and kidney fx -  HCV negative, HIV NR, SPEP/UPEP pending  Desaturations at night -  outpateint sleep study -  Prn nightly O2  Progressive weakness and FTT -  PT/OT recommending 24h supervision  Diet:  regular Access:  PIV IVF:  yes Proph:  heparin  Code Status: full Family Communication: patient and her daughter Disposition Plan:  Family trying to figure  out if they can hire caregiver during teh day when she would be alone vs. Looking into ALF/SNF   Consultants:  Cardiology  Procedures:  ECHO  Antibiotics:  none   HPI/Subjective:  Ongoing chest pressure, anorexia.   Very weak and fatigued.  No improvement since admission  Objective: Filed Vitals:   05/22/14 0957 05/22/14 1430 05/22/14 2010 05/23/14 0510  BP: 156/52 146/59 102/43 151/55  Pulse: 54 55 66 53  Temp:  97.5 F (36.4 C) 98.3 F (36.8 C) 97.8 F (36.6 C)  TempSrc:  Oral Oral Oral  Resp: _0 Height:      Weight:    62.9 kg (138 lb 10.7 oz)  SpO2: 96% 96% 97% 93%    Intake/Output Summary (Last 24 hours) at 05/23/14 1541 Last data filed at 05/23/14 0955  Gross per 24 hour  Intake      0 ml  Output    400 ml  Net   -400 ml   Filed Weights   05/21/14 0000 05/22/14 0400 05/23/14 0510  Weight: 61.553 kg (135 lb 11.2 oz) 66.18 kg (145 lb 14.4 oz) 62.9 kg (138 lb 10.7 oz)    Exam:   General:  CF, No acute distress  HEENT:  NCAT, MMM, nasal canula in place  Cardiovascular:  RRR, nl S1, S2, 2/6 systolic murmur, 2+ pulses, warm extremities  Respiratory:  CTAB, no increased WOB  Abdomen:   NABS, soft, mild TTP in epigastrium without rebound or guarding, nondistended  MSK:   Normal tone and bulk, no LEE  Neuro:  Grossly intact  Data Reviewed: Basic Metabolic Panel:  Recent Labs Lab 05/20/14 1711 05/21/14 0920 05/22/14 0340 05/23/14 0333  NA 134* 134* 137 137  K 3.3* 3.4* 3.4* 3.9  CL 96 99 103 104  CO2 _1 GLUCOSE 117* 92 105* 93  BUN _2 CREATININE 1.55* 1.39* 1.24* 1.10  CALCIUM 8.1* 8.1* 7.6* 7.8*   Liver Function Tests:  Recent Labs Lab 05/20/14 1711 05/21/14 0920  AST 29 22  ALT 13 11  ALKPHOS 51 42  BILITOT 0.9 0.7  PROT 8.5* 7.1  ALBUMIN 3.0* 2.4*    Recent Labs Lab 05/20/14 1711  LIPASE 27   No results for input(s): AMMONIA in the last 168 hours. CBC:  Recent Labs Lab 05/20/14 1710  05/21/14 0920 05/22/14 0340 05/23/14 0333  WBC 4.0 2.9* 3.2* 3.5*  NEUTROABS  --  1.6*  --   --   HGB 8.9* 7.6* 8.2* 8.1*  HCT 26.8* 23.6* 25.5* 25.1*  MCV 98.5 100.0 95.9 95.1  PLT 221 171 151 148*   Cardiac Enzymes:  Recent Labs Lab 05/20/14 0110 05/21/14 0920  CKTOTAL 78  --   CKMB 0.8  --   TROPONINI 0.03 <0.03   BNP (last 3 results)  Recent Labs  05/20/14 1709  BNP 144.9*    ProBNP (last 3 results)  Recent Labs  07/19/13 1149 02/08/14 1727  PROBNP 299.3 121.3    CBG: No results for input(s):  GLUCAP in the last 168 hours.  Recent Results (from the past 240 hour(s))  MRSA PCR Screening     Status: None   Collection Time: 05/21/14 12:22 AM  Result Value Ref Range Status   MRSA by PCR NEGATIVE NEGATIVE Final    Comment:        The GeneXpert MRSA Assay (FDA approved for NASAL specimens only), is one component of a comprehensive MRSA colonization surveillance program. It is not intended to diagnose MRSA infection nor to guide or monitor treatment for MRSA infections.      Studies: Nm Gastric Emptying  05/23/2014   CLINICAL DATA:  Pain and reflux.  EXAM: NUCLEAR MEDICINE GASTRIC EMPTYING SCAN  TECHNIQUE: After oral ingestion of radiolabeled meal, sequential abdominal images were obtained for 4 hours. Percentage of activity emptying the stomach calculated at 1 hour, 2 hour, 3 hour, and 4 hours.  RADIOPHARMACEUTICALS:  2.0 mCi technetium 99-m labeled sulfur colloid  COMPARISON:  None.  FINDINGS: Expected location of the stomach in the left upper quadrant. Ingested meal empties the stomach gradually over the course of the study.  92% emptied at 4 hr ( normal >= 90%)  IMPRESSION: Normal gastric emptying study.   Electronically Signed   By: [place]   On: 05/23/2014 13:42    Scheduled Meds: . aspirin  325 mg Oral Daily  . docusate sodium  100 mg Oral BID  . ezetimibe  10 mg Oral QHS  . feeding supplement (ENSURE COMPLETE)  237 mL Oral BID BM  .  feeding supplement (RESOURCE BREEZE)  1 Container Oral TID BM  . heparin  5,000 Units Subcutaneous 3 times per day  . isosorbide mononitrate  60 mg Oral Daily  . pantoprazole  40 mg Oral BID AC  . polyethylene glycol  17 g Oral Daily  . senna  2 tablet Oral QHS  . traZODone  100 mg Oral QHS   Continuous Infusions:    Principal Problem:   Chest pressure Active Problems:   GERD (gastroesophageal reflux disease)   COPD (chronic obstructive pulmonary disease)   Hypertension   CAD (coronary artery disease) of artery bypass graft, atretic LIMA   Nausea and vomiting   Protein-calorie malnutrition, severe   Normocytic anemia   Malnutrition of moderate degree    Time spent: 30 min    Zakaiya Lares, [facility] Pager [phone]. If 7PM-7AM, please contact night-coverage at [email or web address], password [facility] 05/23/2014, 3:41 PM  LOS: 3 days

## 2014-05-24 ENCOUNTER — Inpatient Hospital Stay (HOSPITAL_COMMUNITY): Payer: Medicare Other

## 2014-05-24 DIAGNOSIS — R112 Nausea with vomiting, unspecified: Secondary | ICD-10-CM | POA: Insufficient documentation

## 2014-05-24 DIAGNOSIS — R42 Dizziness and giddiness: Secondary | ICD-10-CM | POA: Insufficient documentation

## 2014-05-24 LAB — TROPONIN I
Troponin I: 0.04 ng/mL — ABNORMAL HIGH (ref <0.031)
Troponin I: <0.03 ng/mL (ref <0.031)

## 2014-05-24 LAB — PROTEIN ELECTROPHORESIS, SERUM
ALPHA-1-GLOBULIN: 7.6 % — AB (ref 2.9–4.9)
ALPHA-2-GLOBULIN: 15.1 % — AB (ref 7.1–11.8)
Albumin ELP: 40.9 % — ABNORMAL LOW (ref 55.8–66.1)
Beta 2: 3.9 % (ref 3.2–6.5)
Beta Globulin: 4.2 % — ABNORMAL LOW (ref 4.7–7.2)
GAMMA GLOBULIN: 28.3 % — AB (ref 11.1–18.8)
M-Spike, %: 1.81 g/dL
Total Protein ELP: 7 g/dL (ref 6.0–8.3)

## 2014-05-24 LAB — CBC
HCT: 31.8 % — ABNORMAL LOW (ref 36.0–46.0)
Hemoglobin: 10.3 g/dL — ABNORMAL LOW (ref 12.0–15.0)
MCH: 30.3 pg (ref 26.0–34.0)
MCHC: 32.4 g/dL (ref 30.0–36.0)
MCV: 93.5 fL (ref 78.0–100.0)
Platelets: 165 10*3/uL (ref 150–400)
RBC: 3.4 MIL/uL — ABNORMAL LOW (ref 3.87–5.11)
RDW: 19.8 % — AB (ref 11.5–15.5)
WBC: 5 10*3/uL (ref 4.0–10.5)

## 2014-05-24 LAB — BASIC METABOLIC PANEL
Anion gap: 4 — ABNORMAL LOW (ref 5–15)
BUN: 11 mg/dL (ref 6–23)
CALCIUM: 8.1 mg/dL — AB (ref 8.4–10.5)
CO2: 28 mmol/L (ref 19–32)
CREATININE: 1.12 mg/dL — AB (ref 0.50–1.10)
Chloride: 102 mmol/L (ref 96–112)
GFR calc non Af Amer: 45 mL/min — ABNORMAL LOW (ref >90)
GFR, EST AFRICAN AMERICAN: 52 mL/min — AB (ref >90)
Glucose, Bld: 99 mg/dL (ref 70–99)
POTASSIUM: 4.5 mmol/L (ref 3.5–5.1)
Sodium: 134 mmol/L — ABNORMAL LOW (ref 135–145)

## 2014-05-24 LAB — TYPE AND SCREEN
ABO/RH(D): A NEG
Antibody Screen: NEGATIVE
UNIT DIVISION: 0
Unit division: 0

## 2014-05-24 LAB — UIFE/LIGHT CHAINS/TP QN, 24-HR UR
ALPHA 2 UR: DETECTED — AB
Albumin, U: DETECTED
Alpha 1, Urine: DETECTED — AB
BETA UR: DETECTED — AB
Gamma Globulin, Urine: DETECTED — AB
TOTAL PROTEIN, URINE-UPE24: 39 mg/dL — AB (ref 5–24)

## 2014-05-24 LAB — FOLATE RBC
Folate, Hemolysate: 442.7 ng/mL
Folate, RBC: 1908 ng/mL (ref >498)
Hematocrit: 23.2 % — ABNORMAL LOW (ref 34.0–46.6)

## 2014-05-24 LAB — GLUCOSE, CAPILLARY: GLUCOSE-CAPILLARY: 85 mg/dL (ref 70–99)

## 2014-05-24 MED ORDER — SODIUM CHLORIDE 0.9 % IV SOLN
INTRAVENOUS | Status: DC
  Administered 2014-05-24 – 2014-05-25 (×2): via INTRAVENOUS

## 2014-05-24 MED ORDER — HYDRALAZINE HCL 20 MG/ML IJ SOLN
5.0000 mg | Freq: Once | INTRAMUSCULAR | Status: AC
  Administered 2014-05-24: 5 mg via INTRAVENOUS
  Filled 2014-05-24: qty 1

## 2014-05-24 NOTE — Progress Notes (Signed)
UR completed 

## 2014-05-24 NOTE — Progress Notes (Signed)
Pt very sweaty and with BP of 192/88. Temp,  Hr and Sats normal. Pt denies any pain but states she feels sick on her stomach. Pt given Zofran and Tylene Fantasia, NP notified of symptoms and elevated BP.  Orders placed. 12 lead EKG done. Will medicate and cont to monitor.

## 2014-05-24 NOTE — Progress Notes (Signed)
Shift event: RN paged this NP secondary to pt's BP being high and pt being diaphoretic. Per RN, pt finished blood transfusion around 10pm 2/16 and had the same diaphoretic reaction on 2/15 after getting blood. Pt denies any chest pain or SOB. Troponins since admission have been neg. Pt has a hx of CAD.  Given sx, will cycle troponins. EKG shows no acute change. No chest pain. Hydralazine x 1 for BP. Clance Boll, NP [facility]

## 2014-05-24 NOTE — Progress Notes (Signed)
Physical Therapy Treatment Patient Details Name: [name] MRN: [id number] DOB: Jul 01, 1931 Today's Date: 05/24/2014    History of Present Illness Pt admit with chest pressure.  Bradycardia and nausea and vomiting as well.  Gastritis and anemia.     PT Comments    Pt admitted with above diagnosis. Pt currently with functional limitations due to balance and endurance deficits as well as vertigo.  Hopeful that treatment today was successful in treating vertigo.  Pt will benefit from SNF with vestibular rehab to progress to independent level for d/c home.   Pt will benefit from skilled PT to increase their independence and safety with mobility to allow discharge to the venue listed below.    Follow Up Recommendations  SNF;Supervision/Assistance - 24 hour (for vestibular rehab)     Equipment Recommendations  None recommended by PT    Recommendations for Other Services       Precautions / Restrictions Precautions Precautions: Fall Restrictions Weight Bearing Restrictions: No    Mobility  Bed Mobility Overal bed mobility: Independent             General bed mobility comments: Assessed pt for BPPV. Pt positive for right BPPV therefore treated the right side.  Suspect that left side may be impaired as well however right side was worse therefore treated that side first.  Pt reports that nausea is better after treatment.  Pt also reported feeling steadier as well with ambulation.    Transfers Overall transfer level: Modified independent Equipment used: 1 person hand held assist             General transfer comment: Got pt a shower cap at end of treatment and helped her use it.  Ambulation/Gait Ambulation/Gait assistance: Min guard Ambulation Distance (Feet): 40 Feet (20 feet x 2) Assistive device: None Gait Pattern/deviations: Step-through pattern;Decreased stride length   Gait velocity interpretation: Below normal speed for age/gender General Gait Details: Pt  ambulated to bathroom - was able to clean herself.  Pt fairly steady overall.  Did not need UE support but occasionally needed steadying assist for safety.  Dizziness improving slowly.      Stairs            Wheelchair Mobility    Modified Rankin (Stroke Patients Only)       Balance Overall balance assessment: Needs assistance Sitting-balance support: No upper extremity supported;Feet supported Sitting balance-Leahy Scale: Fair     Standing balance support: No upper extremity supported;During functional activity Standing balance-Leahy Scale: Fair Standing balance comment: stands at sink to wash hands statically without support.                      Cognition Arousal/Alertness: Awake/alert Behavior During Therapy: WFL for tasks assessed/performed Overall Cognitive Status: Within Functional Limits for tasks assessed                      Exercises      General Comments        Pertinent Vitals/Pain Pain Assessment: No/denies pain  VSS    Home Living                      Prior Function            PT Goals (current goals can now be found in the care plan section) Progress towards PT goals: Progressing toward goals    Frequency  Min 3X/week    PT Plan Discharge plan  needs to be updated    Co-evaluation             End of Session Equipment Utilized During Treatment: Gait belt;Oxygen Activity Tolerance: Patient limited by fatigue Patient left: in chair;with call bell/phone within reach     Time: 0935-1009 PT Time Calculation (min) (ACUTE ONLY): 34 min  Charges:  $Gait Training: 8-22 mins $[place] Rep Proc: 8-22 mins                    G Codes:      Irwin Brakeman F June 18, 2014, 12:06 PM Reigna Ruperto,PT Acute Rehabilitation [phone] (pager)

## 2014-05-24 NOTE — Progress Notes (Signed)
Subjective: No chest pain  Objective: Vital signs in last 24 hours: Temp:  [97.8 F (36.6 C)-99.5 F (37.5 C)] 97.8 F (36.6 C) (02/17 0400) Pulse Rate:  [46-50] 49 (02/17 0614) Resp:  [16-18] 17 (02/17 0400) BP: (121-192)/(50-88) 151/53 mmHg (02/17 0614) SpO2:  [94 %-99 %] 96 % (02/17 0400) Weight:  [136 lb 11.2 oz (62.007 kg)] 136 lb 11.2 oz (62.007 kg) (02/17 0400) Weight change: -1 lb 15.5 oz (-0.893 kg) Last BM Date: 05/22/14 Intake/Output from previous day:+525 02/16 0701 - 02/17 0700 In: 725 [P.O.:360; Blood:365] Out: 200 [Urine:200] Intake/Output this shift:    PE: General:Pleasant affect, NAD Skin:Warm and dry, brisk capillary refill HEENT:normocephalic, sclera clear, mucus membranes moist Neck:supple, no JVD, no bruits  Heart:S1S2 RRR without murmur, gallup, rub or click Lungs:clear without rales, rhonchi, or wheezes TLX:BWIO, non tender, + BS, do not palpate liver spleen or masses Ext:no lower ext edema, 2+ pedal pulses, 2+ radial pulses Neuro:alert and oriented, MAE, follows commands, + facial symmetry Tele: has been discontinued.   Lab Results:  Recent Labs  05/23/14 0333 05/24/14 0431  WBC 3.5* 5.0  HGB 8.1* 10.3*  HCT 25.1* 31.8*  PLT 148* 165   BMET  Recent Labs  05/23/14 0333 05/24/14 0431  NA 137 134*  K 3.9 4.5  CL 104 102  CO2 24 28  GLUCOSE 93 99  BUN 13 11  CREATININE 1.10 1.12*  CALCIUM 7.8* 8.1*    Recent Labs  05/24/14 0431  TROPONINI 0.04*    Lab Results  Component Value Date   CHOL 137 02/09/2014   HDL 27* 02/09/2014   LDLCALC 80 02/09/2014   TRIG 150* 02/09/2014   CHOLHDL 5.1 02/09/2014   Lab Results  Component Value Date   HGBA1C 5.9* 02/08/2014     Lab Results  Component Value Date   TSH 2.439 05/21/2014     Studies/Results: Nm Gastric Emptying  05/23/2014   CLINICAL DATA:  Pain and reflux.  EXAM: NUCLEAR MEDICINE GASTRIC EMPTYING SCAN  TECHNIQUE: After oral ingestion of radiolabeled  meal, sequential abdominal images were obtained for 4 hours. Percentage of activity emptying the stomach calculated at 1 hour, 2 hour, 3 hour, and 4 hours.  RADIOPHARMACEUTICALS:  2.0 mCi technetium 99-m labeled sulfur colloid  COMPARISON:  None.  FINDINGS: Expected location of the stomach in the left upper quadrant. Ingested meal empties the stomach gradually over the course of the study.  92% emptied at 4 hr ( normal >= 90%)  IMPRESSION: Normal gastric emptying study.   Electronically Signed   By: Marcello Moores  Register   On: 05/23/2014 13:42    Medications: I have reviewed the patient's current medications. Scheduled Meds: . sodium chloride   Intravenous Once  . aspirin  325 mg Oral Daily  . docusate sodium  100 mg Oral BID  . ezetimibe  10 mg Oral QHS  . feeding supplement (ENSURE COMPLETE)  237 mL Oral BID BM  . feeding supplement (RESOURCE BREEZE)  1 Container Oral TID BM  . heparin  5,000 Units Subcutaneous 3 times per day  . isosorbide mononitrate  60 mg Oral Daily  . pantoprazole  40 mg Oral BID AC  . polyethylene glycol  17 g Oral Daily  . senna  2 tablet Oral QHS  . traZODone  100 mg Oral QHS   Continuous Infusions:  PRN Meds:.acetaminophen, ALPRAZolam, fentaNYL, ondansetron (ZOFRAN) IV, tiotropium, traMADol  Assessment/Plan: 81 year old well known to  Dr. Tamala Julian admitted with weakness, nausea, and epigastric discomfort. Had hospital stay for chest pain in November 2015 with coronary angio demonstrating atretic LIMA to LAD, patent LAD, and circumflex with widely patent SVG to RCA>. Had Upper endoscopy this admission with no findings to explain complaints. Normal gastric emptying study today. Was seen yesterday for bradycardia, BB was D/C'd and no need for ischemic re-eval.  ASA was decreased to 81 mg daily.   Principal Problem:   Chest pressure- neg MI, most recent troponin 0.04 after blood transfusion- no further ischemic eval needed.  Active Problems:   GERD (gastroesophageal reflux  disease)   COPD (chronic obstructive pulmonary disease)   Hypertension- elevated 192/88 to 151/53- on Imdur 60 mg, would add amlodipine for improved control.   CAD (coronary artery disease) of artery bypass graft, atretic LIMA   Nausea and vomiting   Protein-calorie malnutrition, severe   Normocytic anemia   Malnutrition of moderate degree  Bradycardia-BB stopped- not on tele, but pulse noted at 46 to 49 overnight.  May take 48 hours for HR to improve.  Ambulated with PT today and was stronger, some dizziness.    LOS: 4 days   Time spent with pt. :15 minutes. [facility] R  Nurse Practitioner Certified Pager [phone] or after 5pm and on weekends call [phone] 05/24/2014, 9:38 AM

## 2014-05-24 NOTE — Anesthesia Postprocedure Evaluation (Signed)
  Anesthesia Post-op Note  Patient: [name]  Procedure(s) Performed: Procedure(s): ESOPHAGOGASTRODUODENOSCOPY (EGD) WITH PROPOFOL (Left)  Patient Location: PACU  Anesthesia Type:MAC  Level of Consciousness: awake and alert   Airway and Oxygen Therapy: Patient Spontanous Breathing  Post-op Pain: none  Post-op Assessment: Post-op Vital signs reviewed  Post-op Vital Signs: Reviewed  Last Vitals:  Filed Vitals:   05/24/14 0614  BP: 151/53  Pulse: 49  Temp:   Resp:     Complications: No apparent anesthesia complications

## 2014-05-24 NOTE — Progress Notes (Signed)
Active SNF search is in place.  Physical Therapy is recommending SNF with Vestibular Therapy and CSW has re-sent referral out to SNFs to determine if any facility can offer Vestibular Therapy.  Possible stability tomorrow for d/c.  CSW will re-evaluate tomorrow.  Lorie Phenix. Pauline Good, [place]

## 2014-05-24 NOTE — Progress Notes (Signed)
[facility] PROGRESS NOTE Subjective Pt ate some dinner last night but during night had nausea but no vomiting. Denies abd pain  Objective: Vital signs in last 24 hours: Temp:  [97.8 F (36.6 C)-99.5 F (37.5 C)] 97.8 F (36.6 C) (02/17 0400) Pulse Rate:  [46-50] 49 (02/17 0614) Resp:  [16-18] 17 (02/17 0400) BP: (121-192)/(50-88) 151/53 mmHg (02/17 0614) SpO2:  [94 %-99 %] 96 % (02/17 0400) Weight:  [62.007 kg (136 lb 11.2 oz)] 62.007 kg (136 lb 11.2 oz) (02/17 0400) Last BM Date: 05/22/14  Intake/Output from previous day: 02/16 0701 - 02/17 0700 In: 725 [P.O.:360; Blood:365] Out: 200 [Urine:200] Intake/Output this shift:    PE: General--sleepy, no distress  Lungs--clear Abdomen--soft nontender  Lab Results:  Recent Labs  05/21/14 0920 05/22/14 0340 05/23/14 0333 05/24/14 0431  WBC 2.9* 3.2* 3.5* 5.0  HGB 7.6* 8.2* 8.1* 10.3*  HCT 23.6* 25.5* 25.1* 31.8*  PLT 171 151 148* 165   BMET  Recent Labs  05/21/14 0920 05/22/14 0340 05/23/14 0333 05/24/14 0431  NA 134* 137 137 134*  K 3.4* 3.4* 3.9 4.5  CL 99 103 104 102  CO2 26 31 24 28   CREATININE 1.39* 1.24* 1.10 1.12*   LFT  Recent Labs  05/21/14 0920  PROT 7.1  AST 22  ALT 11  ALKPHOS 42  BILITOT 0.7   PT/INR  Recent Labs  05/21/14 0920  LABPROT 15.2  INR 1.18   PANCREAS No results for input(s): LIPASE in the last 72 hours.       Studies/Results: Nm Gastric Emptying  05/23/2014   CLINICAL DATA:  Pain and reflux.  EXAM: NUCLEAR MEDICINE GASTRIC EMPTYING SCAN  TECHNIQUE: After oral ingestion of radiolabeled meal, sequential abdominal images were obtained for 4 hours. Percentage of activity emptying the stomach calculated at 1 hour, 2 hour, 3 hour, and 4 hours.  RADIOPHARMACEUTICALS:  2.0 mCi technetium 99-m labeled sulfur colloid  COMPARISON:  None.  FINDINGS: Expected location of the stomach in the left upper quadrant. Ingested meal empties the stomach gradually over the  course of the study.  92% emptied at 4 hr ( normal >= 90%)  IMPRESSION: Normal gastric emptying study.   Electronically Signed   By: Marcello Moores  Register   On: 05/23/2014 13:42    Medications: I have reviewed the patient's current medications.  Assessment/Plan: 1. Chronic Nausea. Has had fairly extensive evaluation. EGD, gastric emptying scan, CT abd/pelvis, [place], and labs have all failed to reveal any potential cause. ? If medicine related. ? If needs CT of brain to r/o lesion if hasn't been done   Kreed Kauffman JR,Abdiel Blackerby L 05/24/2014, 8:28 AM

## 2014-05-24 NOTE — Progress Notes (Signed)
[facility] PROGRESS NOTE  [name] NTI:[id number] DOB: [birth year] DOA: 05/20/2014 PCP: [name], MD  Brief Summary  81 y.o. female with Past medical history of COPD, recent treatment for pneumonia, coronary artery disease with CABG with cath 02/2014, hypertension, dyslipidemia, GERD.  The patient is presents with chest and epigastric pain with nausea and vomiting for last month.  She was hospitalized about a month ago for pneumonia and since then she has had no appetite, forcing herself to eat and drink.  She has constant chest pressure and is not sure if anything makes it better or worse.  She denies odynophagia and dysphagia.  Denies hematemesis, melena, hematochezia, diarrhea.  She also has some increased SOB.  She has lost 10-lbs in the last month.  In the ER, CT scan did not reveal any obvious etiology.  She is clearly more anemic which may be contributing to her shortness fo breath and fatigue.  Occult, however, was negative.  Work up for anemia has so far been unremarkable, no vitamin or iron deficiencies.  Work up for multiple myeloma ordered and SPEP/UPEP are pending.  EGD on 2/15 demonstrated one small gastric body AVM and some mild pyloric channel edema, no old or fresh blood. Gastric emptying study was also normal with no obvious explanation for the patient's nausea, vomiting, and weight loss.  Her heart rate continues to remain in the low 40s but cardiology does not feel this is contributing to her symptoms.  Her symptoms are persistent, plan to evaluate with a MRI brain to look for mass.  Assessment/Plan  Chest pressure in patient with known CAD s/p CABG.  She had a coronary angiography 02/2014 which did not show any significant stenosis amenable to intervention.   She has sinus bradycardia that trends down to the 30s. Her chest pressure has resolved and her bradycardia improved and her HR is in low 50/min today. We continued to hold her b blocker. Reviewed her telemetry  moniter overnight, no arrhthymias', sinus bradycardia.   ECHO: Posterior basal hypokinesis with normal systolic function, ejection fraction of 50-55%. Doppler parameters were consistent with elevated end-diastolic filling pressure. Meanwhile cardiology consulted and currently no indication to place a PPM.   Fatigue, may be due to progressive anemia, poor appetite/weight loss, bradycardia ? Possibly secondary to anemia .  S/p one unit of prbc transfusion and Her hemoglobin has improved to 10. She reports she feels little better today when compared to yesterday but still very  Nauseated. TSH within normal limits. Stool for occult blood negative twice. Physical therapy recommended SNF placement.    Weight loss likely due to anorexia Liberal diet and supplements as ordered by nutrition.   Nausea vomiting may be related to gastritis/GERD or due to above Unclear etiology. Gastroenterology consulted and so far the work up has been negative. She underwent Gastric emptying study:  Normal and EGD:  No explanation for symptoms. We are currently treating her symptomatically for the above symptoms. Also plan to obtain an MRI brain to evaluate for central tumors that might be causing her symptoms. If its negative, then we will start some appetite stimulants.   Hypertension: Better controlled. Resume the same medications.   Constipation, continue Miralex.  Acute kidney injury: She was started on fluids and her renal function improved. Started her on NS and repeat renal parameters in am.   Normocytic anemia, : -  Iron/B12/folate/TSH were within normal limits. Her retic count appears normal.  -  SPEP/UPEP pending -  Repeat occult stool  neg -  EGD with one small nonbleeding AVM  Globulin gap >> if MM, may explain her progressive anemia and kidney fx -  HCV negative, HIV NR, SPEP/UPEP pending  Desaturations at night -  outpateint sleep study -  Prn nightly O2  Progressive weakness and FTT -  PT/OT  recommending 24h supervision and SNF with vestibular eval.   Diet:  regular Access:  PIV IVF:  yes Proph:  heparin  Code Status: full Family Communication:none at bedside.  Disposition Plan: possibly in a day or two, Looking into ALF/SNF   Consultants:  Cardiology  Procedures:  ECHO  MRI brain.  Antibiotics:  none   HPI/Subjective:  Some improvement since yesterday, no abdominal pain, slight dizziness , still nauseated.  No vomiting.    Objective: Filed Vitals:   05/24/14 0500 05/24/14 0614 05/24/14 1500 05/24/14 1530  BP: 169/56 151/53 138/44 139/43  Pulse:  49 55 50  Temp:    98 F (36.7 C)  TempSrc:    Oral  Resp:   16 16  Height:      Weight:      SpO2:        Intake/Output Summary (Last 24 hours) at 05/24/14 1735 Last data filed at 05/24/14 0500  Gross per 24 hour  Intake    605 ml  Output      0 ml  Net    605 ml   Filed Weights   05/22/14 0400 05/23/14 0510 05/24/14 0400  Weight: 66.18 kg (145 lb 14.4 oz) 62.9 kg (138 lb 10.7 oz) 62.007 kg (136 lb 11.2 oz)    Exam:   General:  Comfortable, no distress.   Cardiovascular:  RRR, nl S1, S2, 2/6 systolic murmur, 2+ pulses, warm extremities  Respiratory:  CTAB, no increased WOB, no wheezing or rhonchi.   Abdomen:   NABS, soft, no tenderness. without rebound or guarding, nondistended  MSK:   Normal tone and bulk, no LEE  Neuro:  Grossly intact  Data Reviewed: Basic Metabolic Panel:  Recent Labs Lab 05/20/14 1711 05/21/14 0920 05/22/14 0340 05/23/14 0333 05/24/14 0431  NA 134* 134* 137 137 134*  K 3.3* 3.4* 3.4* 3.9 4.5  CL 96 99 103 104 102  CO2 $Re'28 26 31 24 28  'PgV$ GLUCOSE 117* 92 105* 93 99  BUN $Re'16 14 13 13 11  'pUY$ CREATININE 1.55* 1.39* 1.24* 1.10 1.12*  CALCIUM 8.1* 8.1* 7.6* 7.8* 8.1*   Liver Function Tests:  Recent Labs Lab 05/20/14 1711 05/21/14 0920  AST 29 22  ALT 13 11  ALKPHOS 51 42  BILITOT 0.9 0.7  PROT 8.5* 7.1  ALBUMIN 3.0* 2.4*    Recent Labs Lab  05/20/14 1711  LIPASE 27   No results for input(s): AMMONIA in the last 168 hours. CBC:  Recent Labs Lab 05/20/14 1710 05/21/14 0920 05/22/14 0340 05/23/14 0333 05/24/14 0431  WBC 4.0 2.9* 3.2* 3.5* 5.0  NEUTROABS  --  1.6*  --   --   --   HGB 8.9* 7.6* 8.2* 8.1* 10.3*  HCT 26.8* 23.6*  23.2* 25.5* 25.1* 31.8*  MCV 98.5 100.0 95.9 95.1 93.5  PLT 221 171 151 148* 165   Cardiac Enzymes:  Recent Labs Lab 05/20/14 0110 05/21/14 0920 05/24/14 0431 05/24/14 1035  CKTOTAL 78  --   --   --   CKMB 0.8  --   --   --   TROPONINI 0.03 <0.03 0.04* <0.03   BNP (last 3 results)  Recent Labs  05/20/14 1709 05/23/14 1445  BNP 144.9* 537.1*    ProBNP (last 3 results)  Recent Labs  07/19/13 1149 02/08/14 1727  PROBNP 299.3 121.3    CBG:  Recent Labs Lab 05/24/14 0415  GLUCAP 85    Recent Results (from the past 240 hour(s))  MRSA PCR Screening     Status: None   Collection Time: 05/21/14 12:22 AM  Result Value Ref Range Status   MRSA by PCR NEGATIVE NEGATIVE Final    Comment:        The GeneXpert MRSA Assay (FDA approved for NASAL specimens only), is one component of a comprehensive MRSA colonization surveillance program. It is not intended to diagnose MRSA infection nor to guide or monitor treatment for MRSA infections.      Studies: Nm Gastric Emptying  05/23/2014   CLINICAL DATA:  Pain and reflux.  EXAM: NUCLEAR MEDICINE GASTRIC EMPTYING SCAN  TECHNIQUE: After oral ingestion of radiolabeled meal, sequential abdominal images were obtained for 4 hours. Percentage of activity emptying the stomach calculated at 1 hour, 2 hour, 3 hour, and 4 hours.  RADIOPHARMACEUTICALS:  2.0 mCi technetium 99-m labeled sulfur colloid  COMPARISON:  None.  FINDINGS: Expected location of the stomach in the left upper quadrant. Ingested meal empties the stomach gradually over the course of the study.  92% emptied at 4 hr ( normal >= 90%)  IMPRESSION: Normal gastric emptying  study.   Electronically Signed   By: Bennett   On: 05/23/2014 13:42    Scheduled Meds: . sodium chloride   Intravenous Once  . aspirin  325 mg Oral Daily  . docusate sodium  100 mg Oral BID  . ezetimibe  10 mg Oral QHS  . feeding supplement (ENSURE COMPLETE)  237 mL Oral BID BM  . feeding supplement (RESOURCE BREEZE)  1 Container Oral TID BM  . heparin  5,000 Units Subcutaneous 3 times per day  . isosorbide mononitrate  60 mg Oral Daily  . pantoprazole  40 mg Oral BID AC  . polyethylene glycol  17 g Oral Daily  . senna  2 tablet Oral QHS  . traZODone  100 mg Oral QHS   Continuous Infusions: . sodium chloride 75 mL/hr at 05/24/14 1514    Principal Problem:   Chest pressure Active Problems:   GERD (gastroesophageal reflux disease)   COPD (chronic obstructive pulmonary disease)   Hypertension   CAD (coronary artery disease) of artery bypass graft, atretic LIMA   Nausea and vomiting   Protein-calorie malnutrition, severe   Normocytic anemia   Malnutrition of moderate degree    Time spent: 25  min    [facility] Pager [phone] . If 7PM-7AM, please contact night-coverage at [email or web address], password [facility] 05/24/2014, 5:35 PM  LOS: 4 days

## 2014-05-25 LAB — CBC
HEMATOCRIT: 33.7 % — AB (ref 36.0–46.0)
Hemoglobin: 10.8 g/dL — ABNORMAL LOW (ref 12.0–15.0)
MCH: 30.9 pg (ref 26.0–34.0)
MCHC: 32 g/dL (ref 30.0–36.0)
MCV: 96.6 fL (ref 78.0–100.0)
Platelets: 163 10*3/uL (ref 150–400)
RBC: 3.49 MIL/uL — AB (ref 3.87–5.11)
RDW: 19.5 % — AB (ref 11.5–15.5)
WBC: 4.2 10*3/uL (ref 4.0–10.5)

## 2014-05-25 LAB — BASIC METABOLIC PANEL
ANION GAP: 4 — AB (ref 5–15)
BUN: 9 mg/dL (ref 6–23)
CO2: 28 mmol/L (ref 19–32)
CREATININE: 1.01 mg/dL (ref 0.50–1.10)
Calcium: 8.3 mg/dL — ABNORMAL LOW (ref 8.4–10.5)
Chloride: 104 mmol/L (ref 96–112)
GFR calc Af Amer: 59 mL/min — ABNORMAL LOW (ref >90)
GFR calc non Af Amer: 51 mL/min — ABNORMAL LOW (ref >90)
Glucose, Bld: 85 mg/dL (ref 70–99)
Potassium: 3.8 mmol/L (ref 3.5–5.1)
Sodium: 136 mmol/L (ref 135–145)

## 2014-05-25 MED ORDER — TRAMADOL HCL 50 MG PO TABS: 100.0000 mg | ORAL_TABLET | Freq: Four times a day (QID) | ORAL | Status: DC | PRN

## 2014-05-25 MED ORDER — PANTOPRAZOLE SODIUM 40 MG PO TBEC: 40.0000 mg | DELAYED_RELEASE_TABLET | Freq: Two times a day (BID) | ORAL | Status: DC

## 2014-05-25 MED ORDER — ENSURE COMPLETE PO LIQD: 237.0000 mL | Freq: Two times a day (BID) | ORAL | Status: DC

## 2014-05-25 MED ORDER — BOOST / RESOURCE BREEZE PO LIQD: 1.0000 | Freq: Three times a day (TID) | ORAL | Status: DC

## 2014-05-25 MED ORDER — EZETIMIBE 10 MG PO TABS: 10.0000 mg | ORAL_TABLET | Freq: Every day | ORAL | Status: DC

## 2014-05-25 MED ORDER — ALPRAZOLAM 0.5 MG PO TABS: 0.2500 mg | ORAL_TABLET | Freq: Every day | ORAL | Status: DC | PRN

## 2014-05-25 NOTE — Progress Notes (Signed)
Pt transported to [facility] via daughter, report called.

## 2014-05-25 NOTE — Discharge Summary (Signed)
Physician Discharge Summary  [name] ZOX:[id number] DOB: [birth year] DOA: 05/20/2014  PCP: [name], MD  Admit date: 05/20/2014 Discharge date: 05/25/2014  Time spent: 30 minutes  Recommendations for Outpatient Follow-up:  1. Follow up with Dr Lysle Rubens  In one week. and check her SPEP and UPEP  And possible referral to hematologist if needed. . 2. Follow up with outpatient sleep study  3. Please check CBC in one week to make sure her hemoglobin is improving.  4. Follow up with BMP in one week to check renal function and electrolytes 5. We have stopped metoprolol as her HR is borderline.   Discharge Diagnoses:  Principal Problem:   Chest pressure Active Problems:   GERD (gastroesophageal reflux disease)   COPD (chronic obstructive pulmonary disease)   Hypertension   CAD (coronary artery disease) of artery bypass graft, atretic LIMA   Nausea and vomiting   Protein-calorie malnutrition, severe   Normocytic anemia   Malnutrition of moderate degree   Dizziness   Nausea with vomiting   Discharge Condition: improved  Diet recommendation: regular diet  Filed Weights   05/23/14 0510 05/24/14 0400 05/25/14 0500  Weight: 62.9 kg (138 lb 10.7 oz) 62.007 kg (136 lb 11.2 oz) 61.689 kg (136 lb)    History of present illness:  81 y.o. female with Past medical history of COPD, recent treatment for pneumonia, coronary artery disease with CABG with cath 02/2014, hypertension, dyslipidemia, GERD. The patient is presents with chest and epigastric pain with nausea and vomiting for last month. She was hospitalized about a month ago for pneumonia and since then she has had no appetite, forcing herself to eat and drink. She has constant chest pressure and is not sure if anything makes it better or worse. She denies odynophagia and dysphagia. Denies hematemesis, melena, hematochezia, diarrhea. She also has some increased SOB. She has lost 10-lbs in the last month. In the ER, CT scan  did not reveal any obvious etiology. She is clearly more anemic which may be contributing to her shortness fo breath and fatigue. Occult, however, was negative. Work up for anemia has so far been unremarkable, no vitamin or iron deficiencies. Work up for multiple myeloma ordered and SPEP/UPEP are pending. EGD on 2/15 demonstrated one small gastric body AVM and some mild pyloric channel edema, no old or fresh blood. Gastric emptying study was also normal with no obvious explanation for the patient's nausea, . Her vomiting has resolved and her appetite is slowly improving.  An MRI brain was done to evaluate for intracranial masses contributing to her nausea and it was negative.   Hospital Course:   Chest pressure in patient with known CAD s/p CABG. She had a coronary angiography 02/2014 which did not show any significant stenosis amenable to intervention. She has sinus bradycardia that trends down to the 30s. Her chest pressure has resolved and her bradycardia improved and her HR is in low 50/min today. We continued to hold her b blocker. Reviewed her telemetry moniter overnight, no arrhthymias', sinus bradycardia.  ECHO: Posterior basal hypokinesis with normal systolic function, ejection fraction of 50-55%. Doppler parameters were consistent with elevated end-diastolic filling pressure. Meanwhile cardiology consulted and currently no indication to place a PPM.   Fatigue, may be due to progressive anemia, poor appetite/weight loss, bradycardia ? Possibly secondary to anemia . S/p one unit of prbc transfusion and Her hemoglobin has improved to 10. She reports she feels little better today when compared to yesterday and nausea is slightly improved.  TSH within normal limits. Stool for occult blood negative twice. Physical therapy recommended SNF placement.    Weight loss likely due to anorexia Liberal diet and supplements as ordered by nutrition.   Nausea vomiting may be related to  gastritis/GERD or due to above Unclear etiology. Gastroenterology consulted and so far the work up has been negative. She underwent Gastric emptying study: Normal and EGD: No explanation for symptoms. We are currently treating her symptomatically for the above symptoms. Obtained an MRI brain which was negative for intracranial lesions.   Hypertension: Better controlled. Resume the same medications.   Constipation, continue Miralex.  Acute kidney injury: She was started on fluids and her renal function improved.   Normocytic anemia, : - Iron/B12/folate/TSH were within normal limits. Her retic count appears normal.  - SPEP/UPEP pending. Recommend outpatient follow up to see if she has multiple myeloma.  - Repeat occult stool neg - EGD with one small nonbleeding AVM  Globulin gap >> if MM, may explain her progressive anemia and kidney fx - HCV negative, HIV NR, SPEP/UPEP pending  Desaturations at night - outpateint sleep study - Prn nightly O2  Progressive weakness and FTT - PT/OT recommending 24h supervision and SNF with vestibular eval.  Discussed with the patient and daughter and plan to discharge her today when bed is available.  Procedures:  MRI brain  EGD  Gastric emptying study.   Consultations:  Gastroenterology  cardiology  Discharge Exam: Filed Vitals:   05/25/14 0500  BP: 140/45   Pulse: 82  Temp: 97.8 F (36.6 C)  Resp: 18    General: alert afebrile comfortable Cardiovascular: s1s2 Respiratory: ctab  Discharge Instructions   Discharge Instructions    Diet - low sodium heart healthy    Complete by:  As directed      Discharge instructions    Complete by:  As directed   Follow up with PCP as needed .          Current Discharge Medication List    START taking these medications   Details  !! feeding supplement, ENSURE COMPLETE, (ENSURE COMPLETE) LIQD Take 237 mLs by mouth 2 (two) times daily between meals.    !! feeding  supplement, RESOURCE BREEZE, (RESOURCE BREEZE) LIQD Take 1 Container by mouth 3 (three) times daily between meals. Refills: 0     !! - Potential duplicate medications found. Please discuss with provider.    CONTINUE these medications which have CHANGED   Details  ALPRAZolam (XANAX) 0.5 MG tablet Take 0.5 tablets (0.25 mg total) by mouth daily as needed for anxiety. Qty: 10 tablet, Refills: 0    ezetimibe (ZETIA) 10 MG tablet Take 1 tablet (10 mg total) by mouth at bedtime.    pantoprazole (PROTONIX) 40 MG tablet Take 1 tablet (40 mg total) by mouth 2 (two) times daily. Refills: 0    traMADol (ULTRAM) 50 MG tablet Take 2 tablets (100 mg total) by mouth every 6 (six) hours as needed (pain). For pain Qty: 15 tablet, Refills: 0      CONTINUE these medications which have NOT CHANGED   Details  albuterol-ipratropium (COMBIVENT) 18-103 MCG/ACT inhaler Inhale 1-2 puffs into the lungs every 4 (four) hours as needed for wheezing or shortness of breath.    aspirin 325 MG EC tablet Take 325 mg by mouth daily.    cholecalciferol (VITAMIN D) 1000 UNITS tablet Take 1,000 Units by mouth daily.    ferrous gluconate (FERGON) 324 MG tablet Take 324 mg by mouth  daily with breakfast.    isosorbide mononitrate (IMDUR) 30 MG 24 hr tablet Take 1 tablet (30 mg total) by mouth daily. Qty: 30 tablet, Refills: 11    Melatonin 5 MG CAPS Take 1 capsule by mouth daily as needed (for sleep).    nitroGLYCERIN (NITROSTAT) 0.4 MG SL tablet Place 0.4 mg under the tongue every 5 (five) minutes as needed for chest pain.    Omega-3 Fatty Acids (FISH OIL) 1200 MG CAPS Take 1,200-2,400 mg by mouth 2 (two) times daily. Take 2 capsules (2400 mg) every morning and 1 capsule (1200 mg) every night    ondansetron (ZOFRAN) 8 MG tablet Take 8 mg by mouth every 8 (eight) hours as needed for nausea or vomiting.    polyethylene glycol powder (GLYCOLAX/MIRALAX) powder Take 2 Containers by mouth at bedtime. Mix with 8 oz liquid  and drink Refills: 0    Potassium Gluconate 595 MG CAPS Take 1 capsule by mouth daily.    RA SENNA 8.6 MG tablet Take 1 tablet by mouth daily as needed for constipation.  Refills: 0    tiotropium (SPIRIVA) 18 MCG inhalation capsule Place 18 mcg into inhaler and inhale daily as needed (shortness of breath).     traZODone (DESYREL) 100 MG tablet Take 100 mg by mouth at bedtime. Refills: 0      STOP taking these medications     hydrochlorothiazide (HYDRODIURIL) 25 MG tablet      metoprolol succinate (TOPROL-XL) 25 MG 24 hr tablet      loratadine (CLARITIN) 10 MG tablet      metoprolol succinate (TOPROL-XL) 12.5 mg TB24 24 hr tablet        Allergies  Allergen Reactions  . Lexapro [Escitalopram Oxalate] Other (See Comments)    Fatigue   . Soma [Carisoprodol] Other (See Comments)    Fatigue   . Wellbutrin [Bupropion] Other (See Comments)    "couldn't function and take it"  . Albuterol Sulfate Other (See Comments)    Albuterol HFA inhaler caused nervousness   . Codeine Hives  . Levaquin [Levofloxacin In D5w] Other (See Comments)    Unknown allergic reaction  . Plavix [Clopidogrel Bisulfate] Other (See Comments)    Unknown reaction   Follow-up Information    Follow up with HUSAIN,KARRAR, MD. Schedule an appointment as soon as possible for a visit in 1 week.   Specialty:  Internal Medicine   Contact information:   [address] [place] [address] [phone]        The results of significant diagnostics from this hospitalization (including imaging, microbiology, ancillary and laboratory) are listed below for reference.    Significant Diagnostic Studies: Dg Chest 2 View (if Patient Has Fever And/or Copd)  05/20/2014   CLINICAL DATA:  Chest pain for 1 day.  Vomiting for 2 weeks  EXAM: CHEST  2 VIEW  COMPARISON:  May 11, 2014  FINDINGS: There is scarring in the lateral left base. There is no edema or consolidation. Heart is upper normal in size  with pulmonary vascularity within normal limits. No pneumothorax. Patient is status post coronary artery bypass grafting. There is atherosclerotic change in the aorta. Prominence in the superior mediastinum is stable and felt to represent great vessel prominence in this age group. No bone lesions.  IMPRESSION: Mild scarring left base.  No edema or consolidation.   Electronically Signed   By: Lowella Grip III M.D.   On: 05/20/2014 17:36   Ct Chest W Contrast  05/20/2014   CLINICAL DATA:  81 year old female with chest and upper abdominal pain shortness of breath nausea vomiting. Initial encounter.  EXAM: CT CHEST, ABDOMEN, AND PELVIS WITH CONTRAST  TECHNIQUE: Multidetector CT imaging of the chest, abdomen and pelvis was performed following the standard protocol during bolus administration of intravenous contrast.  CONTRAST:  76mL OMNIPAQUE IOHEXOL 300 MG/ML  SOLN  COMPARISON:  [facility] Chest CT, CT Abdomen and Pelvis 04/09/2014.  FINDINGS: CT CHEST FINDINGS  Resolved airspace disease in the right lung since January. Stable lower lobe scarring or atelectasis greater on the left. Major airways are patent. No new pulmonary opacity.  Stable cardiomegaly. No pericardial or pleural effusion. Extensive atherosclerosis of the thoracic aorta. Coronary calcified plaque. Sequelae of CABG. No mediastinal lymphadenopathy. Negative thoracic inlet.  Scoliosis. Degenerative changes in the spine. No acute osseous abnormality identified.  CT ABDOMEN AND PELVIS FINDINGS  No acute osseous abnormality identified.  No pelvic free fluid.  Diminutive bladder.  Sigmoid diverticulosis with indistinct appearance of the sigmoid colon today, evidence of wall thickening. Mild mesenteric stranding. Sigmoid also is inseparable from the left vaginal fornix (sagittal image 61), and there is a small volume of gas in the vaginal vault.  Diverticulosis decrease is in the left colon which is decompressed and not definitely inflamed.  Decompressed transverse colon. Oral contrast has reached the right colon. The cecum is located in the pelvis. No dilated small bowel. Negative stomach. Small hiatal hernia. Small duodenum diverticulum.  Hepatic steatosis. Surgically absent gallbladder. Portal venous system within normal limits. Stable spleen, pancreas, adrenal glands and kidneys. Aortoiliac calcified atherosclerosis noted. Major arterial structures are patent. No abdominal free fluid. No lymphadenopathy.  IMPRESSION: 1. Resolved right lung pneumonia.  No new abnormality in the chest. 2. Sigmoid diverticulosis. No evidence of rupture or abscess. However, colovaginal fistulous possible. 3. Otherwise stable abdomen and pelvis.   Electronically Signed   By: Genevie Ann M.D.   On: 05/20/2014 21:33   Mr Brain Wo Contrast  05/24/2014   CLINICAL DATA:  Initial UA shin for acute dizziness. History is COPD, coronary artery disease, CABG, hypertension, dyslipidemia.  EXAM: MRI HEAD WITHOUT CONTRAST  TECHNIQUE: Multiplanar, multiecho pulse sequences of the brain and surrounding structures were obtained without intravenous contrast.  COMPARISON:  Prior CT from 04/09/2014  FINDINGS: Mild diffuse prominence of the CSF containing spaces is compatible with generalized cerebral atrophy. Scattered and confluent T2/FLAIR hyperintensity within knee white matter of both cerebral hemispheres most consistent with chronic small vessel ischemic disease, fairly mild for patient age.  No abnormal foci of restricted diffusion to suggest acute intracranial infarct. Gray-white matter differentiation maintained. Normal intravascular flow voids are present. Basilar artery mildly tortuous and ectatic. No intracranial hemorrhage.  No intracranial mass lesion. No midline shift or mass effect. No hydrocephalus. No extra-axial fluid collection.  Craniocervical junction within normal limits. Pituitary gland normal.  No acute abnormality seen about the orbits.  Paranasal sinuses and  mastoid air cells are clear.  Bone marrow signal intensity is normal. Mild degenerative changes noted within the visualized upper cervical spine. No scalp soft tissue abnormality.  IMPRESSION: 1. No acute intracranial infarct or other abnormality identified. 2. Generalized cerebral atrophy with chronic small vessel ischemic disease involving the supratentorial white matter, fairly mild for patient age.   Electronically Signed   By: Jeannine Boga M.D.   On: 05/24/2014 19:52   Nm Gastric Emptying  05/23/2014   CLINICAL DATA:  Pain and reflux.  EXAM: NUCLEAR MEDICINE GASTRIC  EMPTYING SCAN  TECHNIQUE: After oral ingestion of radiolabeled meal, sequential abdominal images were obtained for 4 hours. Percentage of activity emptying the stomach calculated at 1 hour, 2 hour, 3 hour, and 4 hours.  RADIOPHARMACEUTICALS:  2.0 mCi technetium 99-m labeled sulfur colloid  COMPARISON:  None.  FINDINGS: Expected location of the stomach in the left upper quadrant. Ingested meal empties the stomach gradually over the course of the study.  92% emptied at 4 hr ( normal >= 90%)  IMPRESSION: Normal gastric emptying study.   Electronically Signed   By: Marcello Moores  Register   On: 05/23/2014 13:42   US Abdomen Complete  05/20/2014   CLINICAL DATA:  81 year old female with upper abdominal pain with nausea. Prior cholecystectomy. Initial encounter.  EXAM: ULTRASOUND ABDOMEN COMPLETE  COMPARISON:  [facility] CT Abdomen and Pelvis 04/09/2014, and earlier  FINDINGS: Gallbladder: Surgically absent  Common bile duct: Diameter: 7 mm, stable and within normal limits in the post cholecystectomy state.  Liver: No focal lesion identified. Echogenicity at the upper limits of normal. No intrahepatic biliary ductal dilatation.  IVC: No abnormality visualized.  Pancreas: Visualized portion unremarkable.  Spleen: Size and appearance within normal limits.  Right Kidney: Length: 9.7 cm. Echogenicity within normal limits. No mass or  hydronephrosis visualized.  Left Kidney: Length: 9.3 cm. Several small simple renal cysts again noted. Echogenicity within normal limits. No mass or hydronephrosis visualized.  Abdominal aorta: No aneurysm visualized.  Other findings: None.  IMPRESSION: Negative abdomen ultrasound status postcholecystectomy.   Electronically Signed   By: Genevie Ann M.D.   On: 05/20/2014 18:34   Ct Abdomen Pelvis W Contrast  05/20/2014   CLINICAL DATA:  81 year old female with chest and upper abdominal pain shortness of breath nausea vomiting. Initial encounter.  EXAM: CT CHEST, ABDOMEN, AND PELVIS WITH CONTRAST  TECHNIQUE: Multidetector CT imaging of the chest, abdomen and pelvis was performed following the standard protocol during bolus administration of intravenous contrast.  CONTRAST:  10mL OMNIPAQUE IOHEXOL 300 MG/ML  SOLN  COMPARISON:  [facility] Chest CT, CT Abdomen and Pelvis 04/09/2014.  FINDINGS: CT CHEST FINDINGS  Resolved airspace disease in the right lung since January. Stable lower lobe scarring or atelectasis greater on the left. Major airways are patent. No new pulmonary opacity.  Stable cardiomegaly. No pericardial or pleural effusion. Extensive atherosclerosis of the thoracic aorta. Coronary calcified plaque. Sequelae of CABG. No mediastinal lymphadenopathy. Negative thoracic inlet.  Scoliosis. Degenerative changes in the spine. No acute osseous abnormality identified.  CT ABDOMEN AND PELVIS FINDINGS  No acute osseous abnormality identified.  No pelvic free fluid.  Diminutive bladder.  Sigmoid diverticulosis with indistinct appearance of the sigmoid colon today, evidence of wall thickening. Mild mesenteric stranding. Sigmoid also is inseparable from the left vaginal fornix (sagittal image 61), and there is a small volume of gas in the vaginal vault.  Diverticulosis decrease is in the left colon which is decompressed and not definitely inflamed. Decompressed transverse colon. Oral contrast has reached the right  colon. The cecum is located in the pelvis. No dilated small bowel. Negative stomach. Small hiatal hernia. Small duodenum diverticulum.  Hepatic steatosis. Surgically absent gallbladder. Portal venous system within normal limits. Stable spleen, pancreas, adrenal glands and kidneys. Aortoiliac calcified atherosclerosis noted. Major arterial structures are patent. No abdominal free fluid. No lymphadenopathy.  IMPRESSION: 1. Resolved right lung pneumonia.  No new abnormality in the chest. 2. Sigmoid diverticulosis. No evidence of rupture or abscess. However, colovaginal fistulous possible. 3. Otherwise stable abdomen  and pelvis.   Electronically Signed   By: Genevie Ann M.D.   On: 05/20/2014 21:33    Microbiology: Recent Results (from the past 240 hour(s))  MRSA PCR Screening     Status: None   Collection Time: 05/21/14 12:22 AM  Result Value Ref Range Status   MRSA by PCR NEGATIVE NEGATIVE Final    Comment:        The GeneXpert MRSA Assay (FDA approved for NASAL specimens only), is one component of a comprehensive MRSA colonization surveillance program. It is not intended to diagnose MRSA infection nor to guide or monitor treatment for MRSA infections.      Labs: Basic Metabolic Panel:  Recent Labs Lab 05/21/14 0920 05/22/14 0340 05/23/14 0333 05/24/14 0431 05/25/14 0456  NA 134* 137 137 134* 136  K 3.4* 3.4* 3.9 4.5 3.8  CL 99 103 104 102 104  CO2 $Re'26 31 24 28 28  'Zkg$ GLUCOSE 92 105* 93 99 85  BUN $Re'14 13 13 11 9  'cdB$ CREATININE 1.39* 1.24* 1.10 1.12* 1.01  CALCIUM 8.1* 7.6* 7.8* 8.1* 8.3*   Liver Function Tests:  Recent Labs Lab 05/20/14 1711 05/21/14 0920  AST 29 22  ALT 13 11  ALKPHOS 51 42  BILITOT 0.9 0.7  PROT 8.5* 7.1  ALBUMIN 3.0* 2.4*    Recent Labs Lab 05/20/14 1711  LIPASE 27   No results for input(s): AMMONIA in the last 168 hours. CBC:  Recent Labs Lab 05/21/14 0920 05/22/14 0340 05/23/14 0333 05/24/14 0431 05/25/14 0456  WBC 2.9* 3.2* 3.5* 5.0 4.2   NEUTROABS 1.6*  --   --   --   --   HGB 7.6* 8.2* 8.1* 10.3* 10.8*  HCT 23.6*  23.2* 25.5* 25.1* 31.8* 33.7*  MCV 100.0 95.9 95.1 93.5 96.6  PLT 171 151 148* 165 163   Cardiac Enzymes:  Recent Labs Lab 05/20/14 0110 05/21/14 0920 05/24/14 0431 05/24/14 1035 05/24/14 1730  CKTOTAL 78  --   --   --   --   CKMB 0.8  --   --   --   --   TROPONINI 0.03 <0.03 0.04* <0.03 <0.03   BNP: BNP (last 3 results)  Recent Labs  05/20/14 1709 05/23/14 1445  BNP 144.9* 537.1*    ProBNP (last 3 results)  Recent Labs  07/19/13 1149 02/08/14 1727  PROBNP 299.3 121.3    CBG:  Recent Labs Lab 05/24/14 0415  GLUCAP 85       Signed:  Beckem Tomberlin  [facility] 05/25/2014, 12:02 PM

## 2014-05-25 NOTE — Progress Notes (Signed)
CSW Armed forces technical officer) spoke with pt and pt daughter. They have chosen [facility]. CSW prepared pt dc packet and provided to pt. Pt daughter to provide transportation and aware of dc packet in pt room. Pt daughter to be here after work around 3:30pm. Programmer, systems and facility notified. CSW signing off.  [place], [place]

## 2014-05-25 NOTE — Progress Notes (Signed)
Clinical Social Work Department CLINICAL SOCIAL WORK PLACEMENT NOTE 05/25/2014  Patient:  AMILLION, SCOBEE  Account Number:  [id number] Admit date:  05/20/2014  Clinical Social Worker:  Berton Mount, Latanya Presser  Date/time:  05/23/2014 05:00 PM  Clinical Social Work is seeking post-discharge placement for this patient at the following level of care:   [place]   (*CSW will update this form in Epic as items are completed)   05/23/2014  Patient/family provided with [place] Department of Clinical Social Work's list of facilities offering this level of care within the geographic area requested by the patient (or if unable, by the patient's family).  05/23/2014  Patient/family informed of their freedom to choose among providers that offer the needed level of care, that participate in Medicare, Medicaid or managed care program needed by the patient, have an available bed and are willing to accept the patient.  05/23/2014  Patient/family informed of [facility]' ownership interest in [facility], as well as of the fact that they are under no obligation to receive care at this facility.  PASARR submitted to EDS on 05/23/2014 PASARR number received on 05/23/2014  FL2 transmitted to all facilities in geographic area requested by pt/family on  05/23/2014 FL2 transmitted to all facilities within larger geographic area on   Patient informed that his/her managed care company has contracts with or will negotiate with  certain facilities, including the following:     Patient/family informed of bed offers received:  05/25/2014 Patient chooses bed at [place] Physician recommends and patient chooses bed at    Patient to be transferred to [place] on  05/25/2014 Patient to be transferred to facility by Family Patient and family notified of transfer on 05/25/2014 Name of family member notified:  Butch Penny (daughter)  The following physician request were entered in  Epic:   Additional CommentsBerton Mount, [place]

## 2014-05-25 NOTE — Care Management Note (Signed)
    Page 1 of 1   05/25/2014     10:47:01 AM CARE MANAGEMENT NOTE 05/25/2014  Patient:  JANEEN, WATSON   Account Number:  [id number]  Date Initiated:  05/25/2014  Documentation initiated by:  Whitman Hero  Subjective/Objective Assessment:   Pt admitted with CP from home     Action/Plan:   Plan to d/c  to snf, CSW to help with assisting this once stable.   Anticipated DC Date:  05/25/2014   Anticipated DC Plan:  SKILLED NURSING FACILITY  In-house referral  Clinical Social Worker      DC Planning Services  CM consult      Choice offered to / List presented to:             Status of service:  Completed, signed off Medicare Important Message given?  YES (If response is "NO", the following Medicare IM given date fields will be blank) Date Medicare IM given:  05/25/2014 Medicare IM given by:  Whitman Hero Date Additional Medicare IM given:   Additional Medicare IM given by:    Discharge Disposition:  [place]  Per UR Regulation:  Reviewed for med. necessity/level of care/duration of stay  If discussed at [place] of Stay Meetings, dates discussed:    Comments:

## 2014-05-25 NOTE — Progress Notes (Signed)
Physical Therapy Treatment Patient Details Name: [name] MRN: [id number] DOB: 08/27/1931 Today's Date: 05/25/2014    History of Present Illness Pt admit with chest pressure.  Bradycardia and nausea and vomiting as well.  Gastritis and anemia.     PT Comments    Pt with c/o of back pain and desires MD to look at it. RN notified. Pt reports "it just dawned on me. When my back flares up it makes me nauseated. Pt treated for L horizontal BPPV this session as well. Pt c/o nausea and back pain limited ambulation tolerance this date. Pt con't to benefit from ST-SNF upon d/c to achieve indep function and undergo vestibular rehab.   Follow Up Recommendations  SNF;Supervision/Assistance - 24 hour     Equipment Recommendations  Rolling walker with 5" wheels    Recommendations for Other Services       Precautions / Restrictions Precautions Precautions: Fall Restrictions Weight Bearing Restrictions: No    Mobility  Bed Mobility Overal bed mobility: Modified Independent                Transfers Overall transfer level: Needs assistance Equipment used: 1 person hand held assist Transfers: Sit to/from Stand Sit to Stand: Min guard         General transfer comment: pt mildly unsteady due to back pain and mild dizziness  Ambulation/Gait Ambulation/Gait assistance: Min assist Ambulation Distance (Feet): 10 Feet (x2 (to/from bathroom)) Assistive device: None Gait Pattern/deviations: Step-through pattern   Gait velocity interpretation: Below normal speed for age/gender General Gait Details: pt steadied self for ambulation holding onto counter/sink/wall. suspect pt to benefit from using RW for longer distances. pt limited by back pain and nausea. pt reports minimal dizziness   Stairs            Wheelchair Mobility    Modified Rankin (Stroke Patients Only)       Balance                                    Cognition Arousal/Alertness:  Awake/alert Behavior During Therapy: WFL for tasks assessed/performed Overall Cognitive Status: Within Functional Limits for tasks assessed                      Exercises      General Comments General comments (skin integrity, edema, etc.): pt treated for L horizontal BPPV. no nystagmus present during head roll test however pt with reports of dizziness and increased nausea with turning L. Pt reported onset of dizziness during treatment but report it to have diminished s/p using bathroom.      Pertinent Vitals/Pain Pain Assessment: 0-10 Pain Score: 10-Worst pain ever Pain Location: back Pain Intervention(s): Heat applied;Monitored during session;Patient requesting pain meds-RN notified    Home Living                      Prior Function            PT Goals (current goals can now be found in the care plan section) Acute Rehab PT Goals Patient Stated Goal: make this nausea go away Progress towards PT goals: Progressing toward goals    Frequency  Min 3X/week    PT Plan Discharge plan needs to be updated    Co-evaluation             End of Session Equipment Utilized During Treatment: Gait belt;Oxygen  Activity Tolerance: Other (comment) (limited by nausea and back pain) Patient left: in bed;with call bell/phone within reach;with bed alarm set     Time: 1223-1252 PT Time Calculation (min) (ACUTE ONLY): 29 min  Charges:  $Therapeutic Activity: 8-22 mins $Canalith Rep Proc: 8-22 mins                    G Codes:      Kingsley Callander 05/25/2014, 1:06 PM   Kittie Plater, PT, DPT Pager #: [phone] Office #: [phone]

## 2014-05-25 NOTE — Progress Notes (Signed)
Occupational Therapy Treatment Patient Details Name: PAHOUA SCHREINER MRN: [id number] DOB: Nov 09, 1931 Today's Date: 05/25/2014    History of present illness Pt admit with chest pressure.  Bradycardia and nausea and vomiting as well.  Gastritis and anemia.    OT comments  Pt seen today for ADL session to address safety and independence. Pt continues to report mild dizziness upon standing and requires Supervision for functional mobility. Pt demonstrated ability to bend over and gather items from low drawer without LOB. Pt also c/o nausea but reports this may be related to her back pain. Now recommending SNF (with vestibular treatment for BPPV). Continue with plan of care.    Follow Up Recommendations  Supervision/Assistance - 24 hour;SNF    Equipment Recommendations  None recommended by OT    Recommendations for Other Services      Precautions / Restrictions Precautions Precautions: Fall Restrictions Weight Bearing Restrictions: No       Mobility Bed Mobility Overal bed mobility: Independent                Transfers Overall transfer level: Modified independent                        ADL Overall ADL's : Needs assistance/impaired     Grooming: Wash/dry hands;Oral care;Brushing hair;Supervision/safety;Standing               Lower Body Dressing: Set up;Sit to/from stand;Supervision/safety   Toilet Transfer: Supervision/safety;Ambulation;Comfort height toilet Toilet Transfer Details (indicate cue type and reason): Pt reaches for countertop, door frame, etc during functional mobility. Toileting- Clothing Manipulation and Hygiene: Supervision/safety;Sit to/from stand       Functional mobility during ADLs: Supervision/safety General ADL Comments: Pt participated in ADL session, ambulating in room to gather ADL items with Supervision. Pt reports mild dizziness, worse with head turns, but improved since yesterday. Pt also reports that her nausea may be  related to her back pain as she states this happens when it "flares up."                Cognition  Arousal/Alertness: Awake/Alert Behavior During Therapy: WFL for tasks assessed/performed Overall Cognitive Status: Within Functional Limits for tasks assessed                                    Pertinent Vitals/ Pain       Pain Assessment: No/denies pain         Frequency Min 2X/week     Progress Toward Goals  OT Goals(current goals can now be found in the care plan section)  Progress towards OT goals: Progressing toward goals  Acute Rehab OT Goals Patient Stated Goal: Get better and go home Time For Goal Achievement: 06/06/14 Potential to Achieve Goals: Good  Plan Discharge plan needs to be updated       End of Session Equipment Utilized During Treatment: Gait belt   Activity Tolerance Patient tolerated treatment well   Patient Left in bed;with call bell/phone within reach;with bed alarm set   Nurse Communication          Time: 3559-7416 OT Time Calculation (min): 18 min  Charges: OT General Charges $OT Visit: 1 Procedure OT Treatments $Self Care/Home Management : 8-22 mins  Villa Herb M 05/25/2014, 12:21 PM   Cyndie Chime, OTR/L Occupational Therapist [phone] (pager)

## 2014-05-25 NOTE — Progress Notes (Signed)
[facility] PROGRESS NOTE Subjective patient still complaining of nausea but no abdominal pain. MRI of the brain shows no acute changes  Objective: Vital signs in last 24 hours: Temp:  [97.8 F (36.6 C)-98.2 F (36.8 C)] 97.8 F (36.6 C) (02/18 0500) Pulse Rate:  [50-82] 82 (02/18 0500) Resp:  [16-18] 18 (02/18 0500) BP: (127-205)/(43-80) 205/80 mmHg (02/18 0500) SpO2:  [95 %] 95 % (02/18 0500) Weight:  [61.689 kg (136 lb)] 61.689 kg (136 lb) (02/18 0500) Last BM Date: 05/24/14  Intake/Output from previous day: 02/17 0701 - 02/18 0700 In: 1280 [P.O.:480; I.V.:800] Out: -  Intake/Output this shift:    PE:  Abdomen-- soft and nontender  Lab Results:  Recent Labs  05/23/14 0333 05/24/14 0431 05/25/14 0456  WBC 3.5* 5.0 4.2  HGB 8.1* 10.3* 10.8*  HCT 25.1* 31.8* 33.7*  PLT 148* 165 163   BMET  Recent Labs  05/23/14 0333 05/24/14 0431 05/25/14 0456  NA 137 134* 136  K 3.9 4.5 3.8  CL 104 102 104  CO2 24 28 28   CREATININE 1.10 1.12* 1.01   LFT No results for input(s): PROT, AST, ALT, ALKPHOS, BILITOT, BILIDIR, IBILI in the last 72 hours. PT/INR No results for input(s): LABPROT, INR in the last 72 hours. PANCREAS No results for input(s): LIPASE in the last 72 hours.       Studies/Results: Mr Brain Wo Contrast  05/24/2014   CLINICAL DATA:  Initial UA shin for acute dizziness. History is COPD, coronary artery disease, CABG, hypertension, dyslipidemia.  EXAM: MRI HEAD WITHOUT CONTRAST  TECHNIQUE: Multiplanar, multiecho pulse sequences of the brain and surrounding structures were obtained without intravenous contrast.  COMPARISON:  Prior CT from 04/09/2014  FINDINGS: Mild diffuse prominence of the CSF containing spaces is compatible with generalized cerebral atrophy. Scattered and confluent T2/FLAIR hyperintensity within knee white matter of both cerebral hemispheres most consistent with chronic small vessel ischemic disease, fairly mild for patient  age.  No abnormal foci of restricted diffusion to suggest acute intracranial infarct. Gray-white matter differentiation maintained. Normal intravascular flow voids are present. Basilar artery mildly tortuous and ectatic. No intracranial hemorrhage.  No intracranial mass lesion. No midline shift or mass effect. No hydrocephalus. No extra-axial fluid collection.  Craniocervical junction within normal limits. Pituitary gland normal.  No acute abnormality seen about the orbits.  Paranasal sinuses and mastoid air cells are clear.  Bone marrow signal intensity is normal. Mild degenerative changes noted within the visualized upper cervical spine. No scalp soft tissue abnormality.  IMPRESSION: 1. No acute intracranial infarct or other abnormality identified. 2. Generalized cerebral atrophy with chronic small vessel ischemic disease involving the supratentorial white matter, fairly mild for patient age.   Electronically Signed   By: Jeannine Boga M.D.   On: 05/24/2014 19:52   Nm Gastric Emptying  05/23/2014   CLINICAL DATA:  Pain and reflux.  EXAM: NUCLEAR MEDICINE GASTRIC EMPTYING SCAN  TECHNIQUE: After oral ingestion of radiolabeled meal, sequential abdominal images were obtained for 4 hours. Percentage of activity emptying the stomach calculated at 1 hour, 2 hour, 3 hour, and 4 hours.  RADIOPHARMACEUTICALS:  2.0 mCi technetium 99-m labeled sulfur colloid  COMPARISON:  None.  FINDINGS: Expected location of the stomach in the left upper quadrant. Ingested meal empties the stomach gradually over the course of the study.  92% emptied at 4 hr ( normal >= 90%)  IMPRESSION: Normal gastric emptying study.   Electronically Signed   By: Marcello Moores  Register  On: 05/23/2014 13:42    Medications: I have reviewed the patient's current medications.  Assessment/Plan: 1. Chronic nausea. Extensive workup negative. Would favor discharge with symptomatic treatment and follow-up with PCP.   Kenaz Olafson JR,Whyatt Klinger L 05/25/2014, 7:39  AM

## 2014-05-25 NOTE — Progress Notes (Signed)
CSW [employer] forces technical officer) spoke with pt and pt daughter and provided with bed offers. Pt states she is fine with whatever her daughter chooses. However, when CSW spoke with pt daughter over the phone she stated that pt will need to make the final decision she does not want that responsibility. CSW expressed that CSW would speak with pt again but it would be beneficial if pt daughter would at least share her opinion so that pt can narrow down choices. Pt does have bed offers from 7 facilities and is finding it difficult to choose when she does not know anything about the facilities.  [place], [place]

## 2014-05-31 ENCOUNTER — Non-Acute Institutional Stay (SKILLED_NURSING_FACILITY): Payer: Medicare Other | Admitting: Internal Medicine

## 2014-05-31 DIAGNOSIS — G4700 Insomnia, unspecified: Secondary | ICD-10-CM

## 2014-05-31 DIAGNOSIS — I10 Essential (primary) hypertension: Secondary | ICD-10-CM | POA: Diagnosis not present

## 2014-05-31 DIAGNOSIS — R5381 Other malaise: Secondary | ICD-10-CM

## 2014-05-31 DIAGNOSIS — R001 Bradycardia, unspecified: Secondary | ICD-10-CM | POA: Diagnosis not present

## 2014-05-31 DIAGNOSIS — K5900 Constipation, unspecified: Secondary | ICD-10-CM

## 2014-05-31 DIAGNOSIS — I25810 Atherosclerosis of coronary artery bypass graft(s) without angina pectoris: Secondary | ICD-10-CM | POA: Diagnosis not present

## 2014-05-31 DIAGNOSIS — D649 Anemia, unspecified: Secondary | ICD-10-CM | POA: Diagnosis not present

## 2014-05-31 DIAGNOSIS — E440 Moderate protein-calorie malnutrition: Secondary | ICD-10-CM | POA: Diagnosis not present

## 2014-05-31 DIAGNOSIS — K219 Gastro-esophageal reflux disease without esophagitis: Secondary | ICD-10-CM

## 2014-06-02 ENCOUNTER — Non-Acute Institutional Stay (SKILLED_NURSING_FACILITY): Payer: Medicare Other | Admitting: Adult Health

## 2014-06-02 ENCOUNTER — Encounter: Payer: Self-pay | Admitting: Adult Health

## 2014-06-02 DIAGNOSIS — J438 Other emphysema: Secondary | ICD-10-CM

## 2014-06-02 DIAGNOSIS — E785 Hyperlipidemia, unspecified: Secondary | ICD-10-CM

## 2014-06-02 DIAGNOSIS — G4700 Insomnia, unspecified: Secondary | ICD-10-CM

## 2014-06-02 DIAGNOSIS — I25810 Atherosclerosis of coronary artery bypass graft(s) without angina pectoris: Secondary | ICD-10-CM

## 2014-06-02 DIAGNOSIS — R531 Weakness: Secondary | ICD-10-CM

## 2014-06-02 DIAGNOSIS — D649 Anemia, unspecified: Secondary | ICD-10-CM

## 2014-06-02 DIAGNOSIS — K5900 Constipation, unspecified: Secondary | ICD-10-CM

## 2014-06-02 DIAGNOSIS — E43 Unspecified severe protein-calorie malnutrition: Secondary | ICD-10-CM

## 2014-06-02 DIAGNOSIS — F419 Anxiety disorder, unspecified: Secondary | ICD-10-CM

## 2014-06-02 DIAGNOSIS — I10 Essential (primary) hypertension: Secondary | ICD-10-CM

## 2014-06-02 DIAGNOSIS — K219 Gastro-esophageal reflux disease without esophagitis: Secondary | ICD-10-CM

## 2014-06-02 NOTE — Progress Notes (Signed)
Patient ID: [name], female   DOB: 27-Jul-1931, 81 y.o.   MRN: [id number]   06/02/2014  Facility:  Nursing Home Location:  [place] Room Number: 102-7 LEVEL OF CARE:  SNF (31)   Chief Complaint  Patient presents with  . Discharge Note    Generalized weakness, anemia, GERD, hypertension, constipation, anxiety, COPD, CAD, insomnia, hyperlipidemia and rotating calorie malnutrition    HISTORY OF PRESENT ILLNESS:  This is an 81 year old female who is for discharge home with daughter. She has been admitted to [facility] on 05/25/14 from [facility]. She has past medical history of COPD, CAD with CABG with cost 02/24/14, hypertension, dyslipidemia and GERD. She has been admitted to the hospital for consistent chest pressure which has now resolved. EGD on 2/15 demonstrated one small gastric body AVM and some mild pyloric channel edema. Work  Up for multiple myeloma, SPEP/UPEP done in the hospital - pending result. Patient will follow-up with Dr. Deforest Hoyles, internist. She has received 1 unit of PRBC in the hospital. Latest hgb 11.0.  Patient was admitted to this facility for short-term rehabilitation after the patient's recent hospitalization.  Patient has completed SNF rehabilitation and therapy has cleared the patient for discharge.  PAST MEDICAL HISTORY:  Past Medical History  Diagnosis Date  . COPD (chronic obstructive pulmonary disease)   . Gout   . Coronary atherosclerosis of native coronary artery 02/05/2012  . Hypertension   . High blood cholesterol   . Heart murmur     "all my life" (02/05/2012)  . Anginal pain   . Myocardial infarction 1990's; 2000's    "2 total, I think" (02/05/2012)  . Pneumonia     "several times" (02/05/2012)  . Chronic bronchitis   . Shortness of breath     "sometimes when I'm laying down; always when I do too much" (02/05/2012)  . History of blood transfusion   . External bleeding hemorrhoids     "act up at  times" (02/05/2012)  . Chronic back pain     "all over" (02/05/2012)  . Depression   . CAD (coronary artery disease) of artery bypass graft 02/09/14    atretic [place]    CURRENT MEDICATIONS: Reviewed per MAR/see medication list  Allergies  Allergen Reactions  . Lexapro [Escitalopram Oxalate] Other (See Comments)    Fatigue   . Soma [Carisoprodol] Other (See Comments)    Fatigue   . Wellbutrin [Bupropion] Other (See Comments)    "couldn't function and take it"  . Albuterol Sulfate Other (See Comments)    Albuterol HFA inhaler caused nervousness   . Codeine Hives  . Levaquin [Levofloxacin In D5w] Other (See Comments)    Unknown allergic reaction  . Plavix [Clopidogrel Bisulfate] Other (See Comments)    Unknown reaction     REVIEW OF SYSTEMS:  GENERAL: no change in appetite, no fatigue, no weight changes, no fever, chills or weakness RESPIRATORY: no cough, SOB, DOE, wheezing, hemoptysis CARDIAC: no chest pain, edema or palpitations GI: no abdominal pain, diarrhea, constipation, heart burn, nausea or vomiting  PHYSICAL EXAMINATION  GENERAL: no acute distress, normal body habitus EYES: conjunctivae normal, sclerae normal, normal eye lids NECK: supple, trachea midline, no neck masses, no thyroid tenderness, no thyromegaly LYMPHATICS: no LAN in the neck, no supraclavicular LAN RESPIRATORY: breathing is even & unlabored, BS CTAB CARDIAC: RRR, no murmur,no extra heart sounds, no edema GI: abdomen soft, normal BS, no masses, no tenderness, no hepatomegaly, no splenomegaly EXTREMITIES:  Able to move 4 extremities; ambulatory PSYCHIATRIC: the patient is alert & oriented to person, affect & behavior appropriate  LABS/RADIOLOGY: 06/01/14  WBC 4.8 hemoglobin 11.0 hematocrit 34.3 MCV 95.3 sodium 134 potassium 4.0 glucose 107 BUN 15 creatinine 1.05 calcium 9.4 Labs reviewed: Basic Metabolic Panel:  Recent Labs  02/08/14 1727  05/23/14 0333 05/24/14 0431 05/25/14 0456  NA  --   <  > 137 134* 136  K  --   < > 3.9 4.5 3.8  CL  --   < > 104 102 104  CO2  --   < > $R'24 28 28  'vc$ GLUCOSE  --   < > 93 99 85  BUN  --   < > $R'13 11 9  'Cy$ CREATININE  --   < > 1.10 1.12* 1.01  CALCIUM  --   < > 7.8* 8.1* 8.3*  MG 2.1  --   --   --   --   < > = values in this interval not displayed.  Liver Function Tests:  Recent Labs  05/20/14 1711 05/21/14 0920  AST 29 22  ALT 13 11  ALKPHOS 51 42  BILITOT 0.9 0.7  PROT 8.5* 7.1  ALBUMIN 3.0* 2.4*    Recent Labs  05/20/14 1711  LIPASE 27   CBC:  Recent Labs  05/21/14 0920  05/23/14 0333 05/24/14 0431 05/25/14 0456  WBC 2.9*  < > 3.5* 5.0 4.2  NEUTROABS 1.6*  --   --   --   --   HGB 7.6*  < > 8.1* 10.3* 10.8*  HCT 23.6*  23.2*  < > 25.1* 31.8* 33.7*  MCV 100.0  < > 95.1 93.5 96.6  PLT 171  < > 148* 165 163  < > = values in this interval not displayed.  Lipid Panel:  Recent Labs  02/09/14 0500  HDL 27*   Cardiac Enzymes:  Recent Labs  05/20/14 0110  05/24/14 0431 05/24/14 1035 05/24/14 1730  CKTOTAL 78  --   --   --   --   CKMB 0.8  --   --   --   --   TROPONINI 0.03  < > 0.04* <0.03 <0.03  < > = values in this interval not displayed.  CBG:  Recent Labs  05/24/14 0415  GLUCAP 85     Dg Chest 2 View (if Patient Has Fever And/or Copd)  05/20/2014   CLINICAL DATA:  Chest pain for 1 day.  Vomiting for 2 weeks  EXAM: CHEST  2 VIEW  COMPARISON:  May 11, 2014  FINDINGS: There is scarring in the lateral left base. There is no edema or consolidation. Heart is upper normal in size with pulmonary vascularity within normal limits. No pneumothorax. Patient is status post coronary artery bypass grafting. There is atherosclerotic change in the aorta. Prominence in the superior mediastinum is stable and felt to represent great vessel prominence in this age group. No bone lesions.  IMPRESSION: Mild scarring left base.  No edema or consolidation.   Electronically Signed   By: Lowella Grip III M.D.   On: 05/20/2014  17:36   Ct Chest W Contrast  05/20/2014   CLINICAL DATA:  81 year old female with chest and upper abdominal pain shortness of breath nausea vomiting. Initial encounter.  EXAM: CT CHEST, ABDOMEN, AND PELVIS WITH CONTRAST  TECHNIQUE: Multidetector CT imaging of the chest, abdomen and pelvis was performed following the standard protocol during bolus administration of intravenous contrast.  CONTRAST:  51mL OMNIPAQUE IOHEXOL 300 MG/ML  SOLN  COMPARISON:  [facility] Chest CT, CT Abdomen and Pelvis 04/09/2014.  FINDINGS: CT CHEST FINDINGS  Resolved airspace disease in the right lung since January. Stable lower lobe scarring or atelectasis greater on the left. Major airways are patent. No new pulmonary opacity.  Stable cardiomegaly. No pericardial or pleural effusion. Extensive atherosclerosis of the thoracic aorta. Coronary calcified plaque. Sequelae of CABG. No mediastinal lymphadenopathy. Negative thoracic inlet.  Scoliosis. Degenerative changes in the spine. No acute osseous abnormality identified.  CT ABDOMEN AND PELVIS FINDINGS  No acute osseous abnormality identified.  No pelvic free fluid.  Diminutive bladder.  Sigmoid diverticulosis with indistinct appearance of the sigmoid colon today, evidence of wall thickening. Mild mesenteric stranding. Sigmoid also is inseparable from the left vaginal fornix (sagittal image 61), and there is a small volume of gas in the vaginal vault.  Diverticulosis decrease is in the left colon which is decompressed and not definitely inflamed. Decompressed transverse colon. Oral contrast has reached the right colon. The cecum is located in the pelvis. No dilated small bowel. Negative stomach. Small hiatal hernia. Small duodenum diverticulum.  Hepatic steatosis. Surgically absent gallbladder. Portal venous system within normal limits. Stable spleen, pancreas, adrenal glands and kidneys. Aortoiliac calcified atherosclerosis noted. Major arterial structures are patent. No abdominal  free fluid. No lymphadenopathy.  IMPRESSION: 1. Resolved right lung pneumonia.  No new abnormality in the chest. 2. Sigmoid diverticulosis. No evidence of rupture or abscess. However, colovaginal fistulous possible. 3. Otherwise stable abdomen and pelvis.   Electronically Signed   By: Genevie Ann M.D.   On: 05/20/2014 21:33   Mr Brain Wo Contrast  05/24/2014   CLINICAL DATA:  Initial UA shin for acute dizziness. History is COPD, coronary artery disease, CABG, hypertension, dyslipidemia.  EXAM: MRI HEAD WITHOUT CONTRAST  TECHNIQUE: Multiplanar, multiecho pulse sequences of the brain and surrounding structures were obtained without intravenous contrast.  COMPARISON:  Prior CT from 04/09/2014  FINDINGS: Mild diffuse prominence of the CSF containing spaces is compatible with generalized cerebral atrophy. Scattered and confluent T2/FLAIR hyperintensity within knee white matter of both cerebral hemispheres most consistent with chronic small vessel ischemic disease, fairly mild for patient age.  No abnormal foci of restricted diffusion to suggest acute intracranial infarct. Gray-white matter differentiation maintained. Normal intravascular flow voids are present. Basilar artery mildly tortuous and ectatic. No intracranial hemorrhage.  No intracranial mass lesion. No midline shift or mass effect. No hydrocephalus. No extra-axial fluid collection.  Craniocervical junction within normal limits. Pituitary gland normal.  No acute abnormality seen about the orbits.  Paranasal sinuses and mastoid air cells are clear.  Bone marrow signal intensity is normal. Mild degenerative changes noted within the visualized upper cervical spine. No scalp soft tissue abnormality.  IMPRESSION: 1. No acute intracranial infarct or other abnormality identified. 2. Generalized cerebral atrophy with chronic small vessel ischemic disease involving the supratentorial white matter, fairly mild for patient age.   Electronically Signed   By: Jeannine Boga M.D.   On: 05/24/2014 19:52   Nm Gastric Emptying  05/23/2014   CLINICAL DATA:  Pain and reflux.  EXAM: NUCLEAR MEDICINE GASTRIC EMPTYING SCAN  TECHNIQUE: After oral ingestion of radiolabeled meal, sequential abdominal images were obtained for 4 hours. Percentage of activity emptying the stomach calculated at 1 hour, 2 hour, 3 hour, and 4 hours.  RADIOPHARMACEUTICALS:  2.0 mCi technetium 99-m labeled sulfur colloid  COMPARISON:  None.  FINDINGS: Expected location of the stomach  in the left upper quadrant. Ingested meal empties the stomach gradually over the course of the study.  92% emptied at 4 hr ( normal >= 90%)  IMPRESSION: Normal gastric emptying study.   Electronically Signed   By: Marcello Moores  Register   On: 05/23/2014 13:42   US Abdomen Complete  05/20/2014   CLINICAL DATA:  81 year old female with upper abdominal pain with nausea. Prior cholecystectomy. Initial encounter.  EXAM: ULTRASOUND ABDOMEN COMPLETE  COMPARISON:  [facility] CT Abdomen and Pelvis 04/09/2014, and earlier  FINDINGS: Gallbladder: Surgically absent  Common bile duct: Diameter: 7 mm, stable and within normal limits in the post cholecystectomy state.  Liver: No focal lesion identified. Echogenicity at the upper limits of normal. No intrahepatic biliary ductal dilatation.  IVC: No abnormality visualized.  Pancreas: Visualized portion unremarkable.  Spleen: Size and appearance within normal limits.  Right Kidney: Length: 9.7 cm. Echogenicity within normal limits. No mass or hydronephrosis visualized.  Left Kidney: Length: 9.3 cm. Several small simple renal cysts again noted. Echogenicity within normal limits. No mass or hydronephrosis visualized.  Abdominal aorta: No aneurysm visualized.  Other findings: None.  IMPRESSION: Negative abdomen ultrasound status postcholecystectomy.   Electronically Signed   By: Genevie Ann M.D.   On: 05/20/2014 18:34   Ct Abdomen Pelvis W Contrast  05/20/2014   CLINICAL DATA:  81 year old female  with chest and upper abdominal pain shortness of breath nausea vomiting. Initial encounter.  EXAM: CT CHEST, ABDOMEN, AND PELVIS WITH CONTRAST  TECHNIQUE: Multidetector CT imaging of the chest, abdomen and pelvis was performed following the standard protocol during bolus administration of intravenous contrast.  CONTRAST:  8mL OMNIPAQUE IOHEXOL 300 MG/ML  SOLN  COMPARISON:  [facility] Chest CT, CT Abdomen and Pelvis 04/09/2014.  FINDINGS: CT CHEST FINDINGS  Resolved airspace disease in the right lung since January. Stable lower lobe scarring or atelectasis greater on the left. Major airways are patent. No new pulmonary opacity.  Stable cardiomegaly. No pericardial or pleural effusion. Extensive atherosclerosis of the thoracic aorta. Coronary calcified plaque. Sequelae of CABG. No mediastinal lymphadenopathy. Negative thoracic inlet.  Scoliosis. Degenerative changes in the spine. No acute osseous abnormality identified.  CT ABDOMEN AND PELVIS FINDINGS  No acute osseous abnormality identified.  No pelvic free fluid.  Diminutive bladder.  Sigmoid diverticulosis with indistinct appearance of the sigmoid colon today, evidence of wall thickening. Mild mesenteric stranding. Sigmoid also is inseparable from the left vaginal fornix (sagittal image 61), and there is a small volume of gas in the vaginal vault.  Diverticulosis decrease is in the left colon which is decompressed and not definitely inflamed. Decompressed transverse colon. Oral contrast has reached the right colon. The cecum is located in the pelvis. No dilated small bowel. Negative stomach. Small hiatal hernia. Small duodenum diverticulum.  Hepatic steatosis. Surgically absent gallbladder. Portal venous system within normal limits. Stable spleen, pancreas, adrenal glands and kidneys. Aortoiliac calcified atherosclerosis noted. Major arterial structures are patent. No abdominal free fluid. No lymphadenopathy.  IMPRESSION: 1. Resolved right lung pneumonia.  No  new abnormality in the chest. 2. Sigmoid diverticulosis. No evidence of rupture or abscess. However, colovaginal fistulous possible. 3. Otherwise stable abdomen and pelvis.   Electronically Signed   By: Genevie Ann M.D.   On: 05/20/2014 21:33    ASSESSMENT/PLAN:  Generalized weakness -  Has been admitted for a short-term rehabilitation; patient is ambulatory and will be discharge with daughter Normocytic anemia -  S/P transfusion of 1 unit packed RBC in the  hospital; hemoglobin 11.0; continue Fergon 325 mg 1 tab by mouth daily GERD - continue Protonix 40 mg by mouth twice a day Protein calorie malnutrition - albumin 2.4; continue supplementation Hypertension - well controlled; continue Imdur 30 mg/24 hour 1 tab by mouth daily Constipation  continue MiraLAX 17 g last 4-6 ounces liquid by mouth twice a day and senna S2 tabs by mouth twice a day Anxiety - mood disease is stable; continue Xanax 0.5 mg 1/2 tab = 0.25 mg by mouth daily when necessary COPD - continue Combivent and Spiriva when necessary CAD - stable; continue Imdur Insomnia - continue trazodone 100 mg by mouth daily at bedtime Hyperlipidemia - continues at the 10 mg by mouth daily at bedtime   I have filled out patient's discharge paperwork and written prescriptions.  Patient will receive home with daughter.  Total discharge time: Lless than 30 minutes  Discharge time involved coordination of the discharge process with Education officer, museum, nursing staff and therapy department.    [facility], NP [employer] [phone]

## 2014-06-11 NOTE — Progress Notes (Signed)
Patient ID: [name], female   DOB: 09-28-1931, 81 y.o.   MRN: [id number]     [facility]   PCP: [name], MD  Code Status: DNR  Allergies  Allergen Reactions  . Lexapro [Escitalopram Oxalate] Other (See Comments)    Fatigue   . Soma [Carisoprodol] Other (See Comments)    Fatigue   . Wellbutrin [Bupropion] Other (See Comments)    "couldn't function and take it"  . Albuterol Sulfate Other (See Comments)    Albuterol HFA inhaler caused nervousness   . Codeine Hives  . Levaquin [Levofloxacin In D5w] Other (See Comments)    Unknown allergic reaction  . Plavix [Clopidogrel Bisulfate] Other (See Comments)    Unknown reaction    Chief Complaint  Patient presents with  . New Admit To SNF     HPI:  81 year old patient is here for short term rehabilitation post hospital admission from 05/20/14-05/25/14 with chest and epigastric pain. CT scan was not revealing. EGD on 2/15 demonstrated one small gastric body AVM and some mild pyloric channel edema, no old or fresh blood. Gastric emptying study was also normal with no obvious explanation for the patient's nausea. Mri brain was negative. She was started on iv fluids and her renal function improved. Her vomiting resolved. She was noted to be bradycardic and was taken off her b blocker. Her heart rate improved. She also received 1 u prbc.  She had hospital admission prior to this for pneumonia. She has past medical history of COPD, rcoronary artery disease with CABG with cath 02/2014, hypertension, dyslipidemia, GERD. She is seen in her room today. She had a bowel movement this am and feels better. Her energy is picking up. She denies further chest pressure.  Review of Systems:  Constitutional: Negative for fever, chills, diaphoresis. she feels that her energy is improving HENT: Negative for headache, congestion Eyes: Negative for eye pain, blurred vision, double vision and discharge.    Respiratory: Negative for cough, shortness of breath and wheezing.   Cardiovascular: Negative for chest pain, palpitations, leg swelling.  Gastrointestinal: Negative for heartburn, nausea, vomiting, melena and rectal bleed Genitourinary: Negative for dysuria  Musculoskeletal: Negative for back pain, falls Skin: Negative for itching, rash.  Neurological: Negative for dizziness, tingling, focal weakness Psychiatric/Behavioral: Negative for depression   Past Medical History  Diagnosis Date  . COPD (chronic obstructive pulmonary disease)   . Gout   . Coronary atherosclerosis of native coronary artery 02/05/2012  . Hypertension   . High blood cholesterol   . Heart murmur     "all my life" (02/05/2012)  . Anginal pain   . Myocardial infarction 1990's; 2000's    "2 total, I think" (02/05/2012)  . Pneumonia     "several times" (02/05/2012)  . Chronic bronchitis   . Shortness of breath     "sometimes when I'm laying down; always when I do too much" (02/05/2012)  . History of blood transfusion   . External bleeding hemorrhoids     "act up at times" (02/05/2012)  . Chronic back pain     "all over" (02/05/2012)  . Depression   . CAD (coronary artery disease) of artery bypass graft 02/09/14    atretic LIMA   Past Surgical History  Procedure Laterality Date  . Appendectomy  ? 1970's  . Ectopic pregnancy surgery  1970's  . Abdominal hysterectomy  ? 1970's    "partial 1st time; complete 2nd" (02/05/2012)  . Cholecystectomy  ~  2010  . Cataract extraction w/ intraocular lens  implant, bilateral  1980's-1990's  . Coronary angioplasty with stent placement  2005  . Coronary artery bypass graft  2005    CABG X2  . Cardiac catheterization  02/09/14    atretic LIMA,  patent VG-dRCA and Total occlusion of the native RCA proximally prior to remotely placement RCA stent, with mild proximal 30% LAD stenosis and 30% distal circumflex stenoses.  EF 55%.  . Left heart catheterization with  coronary/graft angiogram N/A 02/09/2014    Procedure: LEFT HEART CATHETERIZATION WITH Beatrix Fetters;  Surgeon: Troy Sine, MD;  Location: [facility] CATH LAB;  Service: Cardiovascular;  Laterality: N/A;  . Esophagogastroduodenoscopy (egd) with propofol Left 05/22/2014    Procedure: ESOPHAGOGASTRODUODENOSCOPY (EGD) WITH PROPOFOL;  Surgeon: Arta Silence, MD;  Location: [facility] ENDOSCOPY;  Service: Endoscopy;  Laterality: Left;   Social History:   reports that she quit smoking about 3 years ago. Her smoking use included Cigarettes. She has a 50 pack-year smoking history. She has never used smokeless tobacco. She reports that she drinks alcohol. She reports that she does not use illicit drugs.  Family History  Problem Relation Age of Onset  . Anemia Neg Hx   . Arrhythmia Neg Hx   . Asthma Neg Hx   . Clotting disorder Neg Hx   . Fainting Neg Hx   . Heart attack Neg Hx   . Heart disease Neg Hx   . Heart failure Neg Hx   . Hyperlipidemia Neg Hx   . Hypertension Neg Hx   . Aneurysm    . CVA    . CVA Mother   . Aneurysm Father     Medications: Patient's Medications  New Prescriptions   No medications on file  Previous Medications   ALBUTEROL-IPRATROPIUM (COMBIVENT) 18-103 MCG/ACT INHALER    Inhale 1-2 puffs into the lungs every 4 (four) hours as needed for wheezing or shortness of breath.   ALPRAZOLAM (XANAX) 0.5 MG TABLET    Take 0.5 tablets (0.25 mg total) by mouth daily as needed for anxiety.   ASPIRIN 325 MG EC TABLET    Take 325 mg by mouth daily.   CHOLECALCIFEROL (VITAMIN D) 1000 UNITS TABLET    Take 1,000 Units by mouth daily.   EZETIMIBE (ZETIA) 10 MG TABLET    Take 1 tablet (10 mg total) by mouth at bedtime.   FEEDING SUPPLEMENT, ENSURE COMPLETE, (ENSURE COMPLETE) LIQD    Take 237 mLs by mouth 2 (two) times daily between meals.   FEEDING SUPPLEMENT, RESOURCE BREEZE, (RESOURCE BREEZE) LIQD    Take 1 Container by mouth 3 (three) times daily between meals.   FERROUS GLUCONATE  (FERGON) 324 MG TABLET    Take 324 mg by mouth daily with breakfast.   ISOSORBIDE MONONITRATE (IMDUR) 30 MG 24 HR TABLET    Take 1 tablet (30 mg total) by mouth daily.   MELATONIN 5 MG CAPS    Take 1 capsule by mouth daily as needed (for sleep).   NITROGLYCERIN (NITROSTAT) 0.4 MG SL TABLET    Place 0.4 mg under the tongue every 5 (five) minutes as needed for chest pain.   OMEGA-3 FATTY ACIDS (FISH OIL) 1200 MG CAPS    Take 1,200-2,400 mg by mouth 2 (two) times daily. Take 2 capsules (2400 mg) every morning and 1 capsule (1200 mg) every night   ONDANSETRON (ZOFRAN) 8 MG TABLET    Take 8 mg by mouth every 8 (eight) hours as needed for  nausea or vomiting.   PANTOPRAZOLE (PROTONIX) 40 MG TABLET    Take 1 tablet (40 mg total) by mouth 2 (two) times daily.   POLYETHYLENE GLYCOL POWDER (GLYCOLAX/MIRALAX) POWDER    Take 2 Containers by mouth at bedtime. Mix with 8 oz liquid and drink   POTASSIUM GLUCONATE 595 MG CAPS    Take 1 capsule by mouth daily.   RA SENNA 8.6 MG TABLET    Take 1 tablet by mouth daily as needed for constipation.    TIOTROPIUM (SPIRIVA) 18 MCG INHALATION CAPSULE    Place 18 mcg into inhaler and inhale daily as needed (shortness of breath).    TRAMADOL (ULTRAM) 50 MG TABLET    Take 2 tablets (100 mg total) by mouth every 6 (six) hours as needed (pain). For pain   TRAZODONE (DESYREL) 100 MG TABLET    Take 100 mg by mouth at bedtime.  Modified Medications   No medications on file  Discontinued Medications   No medications on file     Physical Exam: Filed Vitals:   05/31/14 1953  BP: 148/64  Pulse: 61  Temp: 96.4 F (35.8 C)  Resp: 17  SpO2: 96%    General- elderly female, well built, in no acute distress Head- normocephalic, atraumatic Throat- moist mucus membrane Cardiovascular- normal s1,s2, no murmurs, no leg edema Respiratory- bilateral clear to auscultation, no wheeze, no rhonchi, no crackles, no use of accessory muscles Abdomen- bowel sounds present, soft, non  tender Musculoskeletal- able to move all 4 extremities, generalized weakness  Neurological- no focal deficit Skin- warm and dry Psychiatry- alert and oriented     Labs reviewed: Basic Metabolic Panel:  Recent Labs  02/08/14 1727  05/23/14 0333 05/24/14 0431 05/25/14 0456  NA  --   < > 137 134* 136  K  --   < > 3.9 4.5 3.8  CL  --   < > 104 102 104  CO2  --   < > 24 28 28   GLUCOSE  --   < > 93 99 85  BUN  --   < > 13 11 9   CREATININE  --   < > 1.10 1.12* 1.01  CALCIUM  --   < > 7.8* 8.1* 8.3*  MG 2.1  --   --   --   --   < > = values in this interval not displayed. Liver Function Tests:  Recent Labs  05/20/14 1711 05/21/14 0920  AST 29 22  ALT 13 11  ALKPHOS 51 42  BILITOT 0.9 0.7  PROT 8.5* 7.1  ALBUMIN 3.0* 2.4*    Recent Labs  05/20/14 1711  LIPASE 27   No results for input(s): AMMONIA in the last 8760 hours. CBC:  Recent Labs  05/21/14 0920  05/23/14 0333 05/24/14 0431 05/25/14 0456  WBC 2.9*  < > 3.5* 5.0 4.2  NEUTROABS 1.6*  --   --   --   --   HGB 7.6*  < > 8.1* 10.3* 10.8*  HCT 23.6*  23.2*  < > 25.1* 31.8* 33.7*  MCV 100.0  < > 95.1 93.5 96.6  PLT 171  < > 148* 165 163  < > = values in this interval not displayed. Cardiac Enzymes:  Recent Labs  05/20/14 0110  05/24/14 0431 05/24/14 1035 05/24/14 1730  CKTOTAL 78  --   --   --   --   CKMB 0.8  --   --   --   --  TROPONINI 0.03  < > 0.04* <0.03 <0.03  < > = values in this interval not displayed. BNP: Invalid input(s): POCBNP CBG:  Recent Labs  05/24/14 0415  GLUCAP 85    Assessment/Plan  Physical deconditioning Will have her work with physical therapy and occupational therapy team to help with gait training and muscle strengthening exercises.fall precautions. Skin care. Encourage to be out of bed. S/p 1 u prbc transfusion. Encourage po intake, monitor daily weight.  Bradycardia Heart rate stable. Avoid AV nodal blocking agent for now  CAD  s/p CABG. Continue aspirin  325 mg daily, imdur 30 mg daily, zetia and prn NTG ECHO: Posterior basal hypokinesis with normal systolic function, ejection fraction of 50-55%. Doppler parameters were consistent with elevated end-diastolic filling pressure.  gerd Continue protonix 40 mg bid, monitor clinically. Continue zofran prn for nausea  Anemia Continue ferrous sulfate, monitor h&h, outpatient follow up with pcp  Protein calorie malnutrition With her weight loss from poor po intake. Her diet has been liberalized. Monitor po intake  Hypertension controlled. Continue imdur for now.    Constipation Had bowel movement this am. continue Miralax bid and senna s 2 tab bid with rectal suppository  insomnia On trazodone and melatonin   Goals of care: short term rehabilitation   Labs/tests ordered: cbc, bmp  Family/ staff Communication: reviewed care plan with patient and nursing supervisor    Blanchie Serve, MD  [facility] [phone] (Monday-Friday 8 am - 5 pm) [phone] (afterhours)

## 2014-06-16 ENCOUNTER — Telehealth: Payer: Self-pay | Admitting: Oncology

## 2014-06-16 NOTE — Telephone Encounter (Signed)
CALLED PT AND SCHEDULED NEW PATIENT APPT FOR 07/20/14 @ 1:30PM W/ DR Alen Blew DX: ANEMIA- UPEP/SPEP ?MGUS REFERRING: DR Lysle Rubens

## 2014-06-20 ENCOUNTER — Telehealth: Payer: Self-pay | Admitting: Oncology

## 2014-06-20 NOTE — Telephone Encounter (Signed)
Called office requesting additional labs.  left message for Katie.

## 2014-07-20 ENCOUNTER — Ambulatory Visit (HOSPITAL_BASED_OUTPATIENT_CLINIC_OR_DEPARTMENT_OTHER): Payer: Medicare Other | Admitting: Oncology

## 2014-07-20 ENCOUNTER — Other Ambulatory Visit: Payer: Medicare Other

## 2014-07-20 ENCOUNTER — Telehealth: Payer: Self-pay | Admitting: Oncology

## 2014-07-20 ENCOUNTER — Ambulatory Visit (HOSPITAL_COMMUNITY)
Admission: RE | Admit: 2014-07-20 | Discharge: 2014-07-20 | Disposition: A | Discharge status: Home / Self Care | Payer: Medicare Other | Source: Ambulatory Visit | Attending: Oncology | Admitting: Oncology

## 2014-07-20 ENCOUNTER — Ambulatory Visit: Payer: Medicare Other

## 2014-07-20 ENCOUNTER — Other Ambulatory Visit (HOSPITAL_BASED_OUTPATIENT_CLINIC_OR_DEPARTMENT_OTHER): Payer: Medicare Other

## 2014-07-20 VITALS — BP 117/60 | HR 78 | Temp 97.6°F | Resp 18 | Ht 62.0 in | Wt 132.1 lb

## 2014-07-20 DIAGNOSIS — D729 Disorder of white blood cells, unspecified: Secondary | ICD-10-CM

## 2014-07-20 DIAGNOSIS — I10 Essential (primary) hypertension: Secondary | ICD-10-CM | POA: Insufficient documentation

## 2014-07-20 DIAGNOSIS — N289 Disorder of kidney and ureter, unspecified: Secondary | ICD-10-CM | POA: Diagnosis not present

## 2014-07-20 DIAGNOSIS — M549 Dorsalgia, unspecified: Secondary | ICD-10-CM | POA: Insufficient documentation

## 2014-07-20 DIAGNOSIS — D472 Monoclonal gammopathy: Secondary | ICD-10-CM | POA: Diagnosis not present

## 2014-07-20 DIAGNOSIS — D649 Anemia, unspecified: Secondary | ICD-10-CM

## 2014-07-20 DIAGNOSIS — G8929 Other chronic pain: Secondary | ICD-10-CM

## 2014-07-20 DIAGNOSIS — R011 Cardiac murmur, unspecified: Secondary | ICD-10-CM | POA: Insufficient documentation

## 2014-07-20 DIAGNOSIS — J449 Chronic obstructive pulmonary disease, unspecified: Secondary | ICD-10-CM | POA: Diagnosis not present

## 2014-07-20 DIAGNOSIS — M545 Low back pain: Secondary | ICD-10-CM

## 2014-07-20 LAB — CBC WITH DIFFERENTIAL/PLATELET
BASO%: 0.3 % (ref 0.0–2.0)
Basophils Absolute: 0 10*3/uL (ref 0.0–0.1)
EOS%: 0.3 % (ref 0.0–7.0)
Eosinophils Absolute: 0 10*3/uL (ref 0.0–0.5)
HCT: 29.4 % — ABNORMAL LOW (ref 34.8–46.6)
HGB: 9.6 g/dL — ABNORMAL LOW (ref 11.6–15.9)
LYMPH#: 1.6 10*3/uL (ref 0.9–3.3)
LYMPH%: 41.8 % (ref 14.0–49.7)
MCH: 31.7 pg (ref 25.1–34.0)
MCHC: 32.7 g/dL (ref 31.5–36.0)
MCV: 97 fL (ref 79.5–101.0)
MONO#: 0.4 10*3/uL (ref 0.1–0.9)
MONO%: 9.8 % (ref 0.0–14.0)
NEUT#: 1.9 10*3/uL (ref 1.5–6.5)
NEUT%: 47.8 % (ref 38.4–76.8)
Platelets: 157 10*3/uL (ref 145–400)
RBC: 3.03 10*6/uL — ABNORMAL LOW (ref 3.70–5.45)
RDW: 20.1 % — AB (ref 11.2–14.5)
WBC: 3.9 10*3/uL (ref 3.9–10.3)
nRBC: 1 % — ABNORMAL HIGH (ref 0–0)

## 2014-07-20 LAB — COMPREHENSIVE METABOLIC PANEL (CC13)
ALT: 13 U/L (ref 0–55)
AST: 22 U/L (ref 5–34)
Albumin: 3.6 g/dL (ref 3.5–5.0)
Alkaline Phosphatase: 60 U/L (ref 40–150)
Anion Gap: 6 mEq/L (ref 3–11)
BILIRUBIN TOTAL: 0.62 mg/dL (ref 0.20–1.20)
BUN: 20.8 mg/dL (ref 7.0–26.0)
CO2: 27 mEq/L (ref 22–29)
CREATININE: 1.2 mg/dL — AB (ref 0.6–1.1)
Calcium: 9.4 mg/dL (ref 8.4–10.4)
Chloride: 105 mEq/L (ref 98–109)
EGFR: 41 mL/min/{1.73_m2} — ABNORMAL LOW (ref >90)
Glucose: 129 mg/dl (ref 70–140)
Potassium: 3.9 mEq/L (ref 3.5–5.1)
Sodium: 138 mEq/L (ref 136–145)
Total Protein: 9.5 g/dL — ABNORMAL HIGH (ref 6.4–8.3)

## 2014-07-20 NOTE — Progress Notes (Signed)
Please see consult note.  

## 2014-07-20 NOTE — Addendum Note (Signed)
Addended by: Randolm Idol on: 07/20/2014 03:33 PM   Modules accepted: Orders, Medications

## 2014-07-20 NOTE — Consult Note (Signed)
Reason for Referral: Plasma cell disorder.   HPI: 81 year old woman with history of COPD and coronary artery disease referred to me for evaluation for a plasma cell disorder. Patient was hospitalized in February 2016 with symptoms of nausea, vomiting and weight loss. She was also found to be anemic with a hemoglobin of 7. 6. Her white cell count and platelet counts were relatively normal. Her anemia workup including a serum protein electrophoresis which showed an M spike that accounts for 1.8 g/dL. There is no quantitative immunoglobulins at that time. Her creatinine was slightly elevated likely related to dehydration normalized upon her discharge to 1.0. Her creatinine clearance was around 50 mL/m. She had a normal calcium and a total of 14. Her albumin was low at 2.4. Her iron studies showed iron level of 56 ferritin of 566. Her folate were more than 20. After her discharge, she have improved significantly including her nausea and vomiting have subsided and her appetite is improving. She does not report any shortness of breath or dyspnea on exertion but does report fatigue. She does not report any increase in any bone pain, pathological fracture or recurrent infections. She lives with her daughter but able to perform most activities of daily living. She recently was able to resume driving without any decline. She does not report any headaches, blurry vision, syncope or seizures. She does not report any fevers, chills, sweats. She does not report any chest pain, palpitation orthopnea. Does not report any cough, hemoptysis or hematemesis. She does report occasional nausea but no vomiting no hematochezia or melena. She does not report any frequency urgency or hesitancy. She does report chronic back pain which is unchanged. Remaining review of systems unremarkable.   Past Medical History  Diagnosis Date  . COPD (chronic obstructive pulmonary disease)   . Gout   . Coronary atherosclerosis of native coronary  artery 02/05/2012  . Hypertension   . High blood cholesterol   . Heart murmur     "all my life" (02/05/2012)  . Anginal pain   . Myocardial infarction 1990's; 2000's    "2 total, I think" (02/05/2012)  . Pneumonia     "several times" (02/05/2012)  . Chronic bronchitis   . Shortness of breath     "sometimes when I'm laying down; always when I do too much" (02/05/2012)  . History of blood transfusion   . External bleeding hemorrhoids     "act up at times" (02/05/2012)  . Chronic back pain     "all over" (02/05/2012)  . Depression   . CAD (coronary artery disease) of artery bypass graft 02/09/14    atretic LIMA  :  Past Surgical History  Procedure Laterality Date  . Appendectomy  ? 1970's  . Ectopic pregnancy surgery  1970's  . Abdominal hysterectomy  ? 1970's    "partial 1st time; complete 2nd" (02/05/2012)  . Cholecystectomy  ~ 2010  . Cataract extraction w/ intraocular lens  implant, bilateral  [phone]  . Coronary angioplasty with stent placement  2005  . Coronary artery bypass graft  2005    CABG X2  . Cardiac catheterization  02/09/14    atretic LIMA,  patent VG-dRCA and Total occlusion of the native RCA proximally prior to remotely placement RCA stent, with mild proximal 30% LAD stenosis and 30% distal circumflex stenoses.  EF 55%.  . Left heart catheterization with coronary/graft angiogram N/A 02/09/2014    Procedure: LEFT HEART CATHETERIZATION WITH Beatrix Fetters;  Surgeon: Troy Sine, MD;  Location: [facility] CATH LAB;  Service: Cardiovascular;  Laterality: N/A;  . Esophagogastroduodenoscopy (egd) with propofol Left 05/22/2014    Procedure: ESOPHAGOGASTRODUODENOSCOPY (EGD) WITH PROPOFOL;  Surgeon: Arta Silence, MD;  Location: [facility] ENDOSCOPY;  Service: Endoscopy;  Laterality: Left;  :   Current outpatient prescriptions:  .  albuterol-ipratropium (COMBIVENT) 18-103 MCG/ACT inhaler, Inhale 1-2 puffs into the lungs every 4 (four) hours as needed for wheezing or  shortness of breath., Disp: , Rfl:  .  ALPRAZolam (XANAX) 0.5 MG tablet, Take 0.5 tablets (0.25 mg total) by mouth daily as needed for anxiety., Disp: 10 tablet, Rfl: 0 .  aspirin 325 MG EC tablet, Take 325 mg by mouth daily., Disp: , Rfl:  .  cholecalciferol (VITAMIN D) 1000 UNITS tablet, Take 1,000 Units by mouth daily., Disp: , Rfl:  .  ezetimibe (ZETIA) 10 MG tablet, Take 1 tablet (10 mg total) by mouth at bedtime., Disp: , Rfl:  .  feeding supplement, ENSURE COMPLETE, (ENSURE COMPLETE) LIQD, Take 237 mLs by mouth 2 (two) times daily between meals., Disp: , Rfl:  .  feeding supplement, RESOURCE BREEZE, (RESOURCE BREEZE) LIQD, Take 1 Container by mouth 3 (three) times daily between meals., Disp: , Rfl: 0 .  ferrous gluconate (FERGON) 324 MG tablet, Take 324 mg by mouth daily with breakfast., Disp: , Rfl:  .  isosorbide mononitrate (IMDUR) 30 MG 24 hr tablet, Take 1 tablet (30 mg total) by mouth daily., Disp: 30 tablet, Rfl: 11 .  Melatonin 5 MG CAPS, Take 1 capsule by mouth daily as needed (for sleep)., Disp: , Rfl:  .  nitroGLYCERIN (NITROSTAT) 0.4 MG SL tablet, Place 0.4 mg under the tongue every 5 (five) minutes as needed for chest pain., Disp: , Rfl:  .  Omega-3 Fatty Acids (FISH OIL) 1200 MG CAPS, Take 1,200-2,400 mg by mouth 2 (two) times daily. Take 2 capsules (2400 mg) every morning and 1 capsule (1200 mg) every night, Disp: , Rfl:  .  ondansetron (ZOFRAN) 8 MG tablet, Take 8 mg by mouth every 8 (eight) hours as needed for nausea or vomiting., Disp: , Rfl:  .  pantoprazole (PROTONIX) 40 MG tablet, Take 1 tablet (40 mg total) by mouth 2 (two) times daily., Disp: , Rfl: 0 .  polyethylene glycol powder (GLYCOLAX/MIRALAX) powder, Take 2 Containers by mouth at bedtime. Mix with 8 oz liquid and drink, Disp: , Rfl: 0 .  Potassium Gluconate 595 MG CAPS, Take 1 capsule by mouth daily., Disp: , Rfl:  .  RA SENNA 8.6 MG tablet, Take 1 tablet by mouth daily as needed for constipation. , Disp: , Rfl:  0 .  tiotropium (SPIRIVA) 18 MCG inhalation capsule, Place 18 mcg into inhaler and inhale daily as needed (shortness of breath). , Disp: , Rfl:  .  traMADol (ULTRAM) 50 MG tablet, Take 2 tablets (100 mg total) by mouth every 6 (six) hours as needed (pain). For pain, Disp: 15 tablet, Rfl: 0 .  traZODone (DESYREL) 100 MG tablet, Take 100 mg by mouth at bedtime., Disp: , Rfl: 0:  Allergies  Allergen Reactions  . Lexapro [Escitalopram Oxalate] Other (See Comments)    Fatigue   . Soma [Carisoprodol] Other (See Comments)    Fatigue   . Wellbutrin [Bupropion] Other (See Comments)    "couldn't function and take it"  . Albuterol Sulfate Other (See Comments)    Albuterol HFA inhaler caused nervousness   . Codeine Hives  . Levaquin [Levofloxacin In D5w] Other (See Comments)  Unknown allergic reaction  . Plavix [Clopidogrel Bisulfate] Other (See Comments)    Unknown reaction  :  Family History  Problem Relation Age of Onset  . Anemia Neg Hx   . Arrhythmia Neg Hx   . Asthma Neg Hx   . Clotting disorder Neg Hx   . Fainting Neg Hx   . Heart attack Neg Hx   . Heart disease Neg Hx   . Heart failure Neg Hx   . Hyperlipidemia Neg Hx   . Hypertension Neg Hx   . Aneurysm    . CVA    . CVA Mother   . Aneurysm Father   :  History   Social History  . Marital Status: Widowed    Spouse Name: N/A  . Number of Children: N/A  . Years of Education: N/A   Occupational History  . Not on file.   Social History Main Topics  . Smoking status: Former Smoker -- 1.00 packs/day for 50 years    Types: Cigarettes    Quit date: 04/02/2011  . Smokeless tobacco: Never Used  . Alcohol Use: Yes     Comment: 02/05/2012 "have a drink of wine ~ q 6 months"  . Drug Use: No  . Sexual Activity: Not Currently   Other Topics Concern  . Not on file   Social History Narrative  :  Pertinent items are noted in HPI.  Exam: ECOG 1 Blood pressure 117/60, pulse 78, temperature 97.6 F (36.4 C),  temperature source Oral, resp. rate 18, height $RemoveBe'5\' 2"'kjaAjPLLI$  (1.575 m), weight 132 lb 1.6 oz (59.92 kg), SpO2 99 %. General appearance: alert and cooperative Head: Normocephalic, without obvious abnormality Throat: lips, mucosa, and tongue normal; teeth and gums normal Neck: no adenopathy Back: negative Resp: clear to auscultation bilaterally Chest wall: no tenderness Cardio: regular rate and rhythm, S1, S2 normal, no murmur, click, rub or gallop GI: soft, non-tender; bowel sounds normal; no masses,  no organomegaly Extremities: extremities normal, atraumatic, no cyanosis or edema Pulses: 2+ and symmetric Skin: Skin color, texture, turgor normal. No rashes or lesions Lymph nodes: Cervical, supraclavicular, and axillary nodes normal.  CBC    Component Value Date/Time   WBC 4.2 05/25/2014 0456   RBC 3.49* 05/25/2014 0456   RBC 2.37* 05/20/2014 0110   HGB 10.8* 05/25/2014 0456   HCT 33.7* 05/25/2014 0456   HCT 23.2* 05/21/2014 0920   PLT 163 05/25/2014 0456   MCV 96.6 05/25/2014 0456   MCH 30.9 05/25/2014 0456   MCHC 32.0 05/25/2014 0456   RDW 19.5* 05/25/2014 0456   LYMPHSABS 0.9 05/21/2014 0920   MONOABS 0.3 05/21/2014 0920   EOSABS 0.0 05/21/2014 0920   BASOSABS 0.0 05/21/2014 0920      Chemistry      Component Value Date/Time   NA 136 05/25/2014 0456   K 3.8 05/25/2014 0456   CL 104 05/25/2014 0456   CO2 28 05/25/2014 0456   BUN 9 05/25/2014 0456   CREATININE 1.01 05/25/2014 0456      Component Value Date/Time   CALCIUM 8.3* 05/25/2014 0456   ALKPHOS 42 05/21/2014 0920   AST 22 05/21/2014 0920   ALT 11 05/21/2014 0920   BILITOT 0.7 05/21/2014 0920     serum protein electrophoresis: Results for DEVANI, ODONNEL (MRN [id number]) as of 07/20/2014 14:02  Ref. Range 05/21/2014 09:20  Total Protein ELP Latest Ref Range: 6.0-8.3 g/dL 7.0  Albumin ELP Latest Ref Range: 55.8-66.1 % 40.9 (L)  Alpha-1-Globulin Latest Ref Range:  2.9-4.9 % 7.6 (H)  Alpha-2-Globulin Latest Ref  Range: 7.1-11.8 % 15.1 (H)  Beta Globulin Latest Ref Range: 4.7-7.2 % 4.2 (L)  Beta 2 Latest Ref Range: 3.2-6.5 % 3.9  Gamma Globulin Latest Ref Range: 11.1-18.8 % 28.3 (H)  M-SPIKE, % Latest Units: g/dL 1.81  SPE Interp. Unknown (NOTE)  Comment Unknown (NOTE)   Immunofixation, Urine   IFE, Urine (with Tot Prot)       Collected: 05/21/14 1246   Resulting lab: [facility]   Value: (NOTE)   Comment: Urine IFE shows a monoclonal IgG heavy chain with associated Kappa  light chain and excess monoclonal free Kappa light chains  (Bence Jones proteins).  Reviewed by Odis Hollingshead, MD, PhD, FCAP (Electronic Signature on  File)  Performed at [place] and Plan:    81 year old woman with the following issues:  1. Monoclonal gammopathy presenting with an abnormal SPEP of 1.8 g/dL. Her urine also showed a monoclonal kappa monoclonal protein. Although the amount of protein in the urine is slightly high. She presented with mild anemia and symptoms of nausea and vomiting which have resolved. The differential diagnosis was discussed today with the patient and her daughter. A plasma cell disorder is certainly a possibility including monoclonal gammopathy of undetermined significance, smoldering multiple myeloma or active multiple myeloma. Amyloidosis also a possibility.  The rectus up completely I will repeat a serum protein electrophoresis, quantitative immunoglobulins with immunofixation, serum light chains and a skeletal survey. I also discussed with her the risks and benefits of a bone marrow biopsy which may be necessary if we are suspecting multiple myeloma at this time. Complication of bone marrow biopsy would include pain, bleeding and rarely infection. She is agreeable to have that done if needed to. We will arrange for that if she has abnormality to suggest multiple myeloma.  2. Normocytic normochromic anemia: This could be multifactorial in nature and could  be related to 2 asthma cell disorder, chronic disease along other factors. A bone marrow biopsy will certainly help with this investigation. She could also benefit from growth factor support.  3. Renal insufficiency: Her creatinine has improved with hydration and she could have a plasma cell disorder that affecting her renal function. I will repeat electrolyte and renal function today to ensure stability.  She will follow-up after this workup is complete. All her questions were answered today to her satisfaction.

## 2014-07-20 NOTE — Telephone Encounter (Signed)
Gave patient avs report and appointment for may. Patient sent back to lab and then [facility] rad for xray.

## 2014-07-20 NOTE — Progress Notes (Signed)
Mailed updated medication list to patient's home 

## 2014-07-24 LAB — SPEP & IFE WITH QIG
Abnormal Protein Band1: 2.9 g/dL
Albumin ELP: 4.3 g/dL (ref 3.8–4.8)
Alpha-1-Globulin: 0.4 g/dL — ABNORMAL HIGH (ref 0.2–0.3)
Alpha-2-Globulin: 0.8 g/dL (ref 0.5–0.9)
Beta 2: 0.3 g/dL (ref 0.2–0.5)
Beta Globulin: 0.4 g/dL (ref 0.4–0.6)
Gamma Globulin: 3.1 g/dL — ABNORMAL HIGH (ref 0.8–1.7)
IGG (IMMUNOGLOBIN G), SERUM: 3430 mg/dL — AB (ref 690–1700)
IgA: 20 mg/dL — ABNORMAL LOW (ref 69–380)
IgM, Serum: <5 mg/dL — ABNORMAL LOW (ref 52–322)
TOTAL PROTEIN, SERUM ELECTROPHOR: 9.3 g/dL — AB (ref 6.1–8.1)

## 2014-07-24 LAB — KAPPA/LAMBDA LIGHT CHAINS
KAPPA FREE LGHT CHN: 14.8 mg/dL — AB (ref 0.33–1.94)
Kappa:Lambda Ratio: 25.52 — ABNORMAL HIGH (ref 0.26–1.65)
Lambda Free Lght Chn: 0.58 mg/dL (ref 0.57–2.63)

## 2014-07-26 ENCOUNTER — Encounter: Payer: Self-pay | Admitting: *Deleted

## 2014-07-26 ENCOUNTER — Telehealth: Payer: Self-pay | Admitting: *Deleted

## 2014-07-26 NOTE — Telephone Encounter (Signed)
Lm on answering machine to call desk nurse, Carter Kassel

## 2014-07-26 NOTE — Telephone Encounter (Signed)
-----   Message from [name] Portela, MD sent at 07/26/2014  2:29 PM EDT ----- Please let her know that the x rays look good but we are still waiting for more tests results.

## 2014-07-26 NOTE — Telephone Encounter (Signed)
TC from patient requesting lab results and scan results from last week (07/20/14). Please call patient.

## 2014-07-26 NOTE — Progress Notes (Signed)
Spoke with patient, per dr Alen Blew x-rays are normal and he is waiting on additional test results.

## 2014-08-09 ENCOUNTER — Telehealth: Payer: Self-pay | Admitting: Oncology

## 2014-08-09 ENCOUNTER — Other Ambulatory Visit (HOSPITAL_BASED_OUTPATIENT_CLINIC_OR_DEPARTMENT_OTHER): Payer: Medicare Other

## 2014-08-09 ENCOUNTER — Ambulatory Visit (HOSPITAL_BASED_OUTPATIENT_CLINIC_OR_DEPARTMENT_OTHER): Payer: Medicare Other | Admitting: Oncology

## 2014-08-09 VITALS — BP 140/56 | HR 70 | Temp 97.3°F | Resp 18 | Ht 62.0 in | Wt 128.5 lb

## 2014-08-09 DIAGNOSIS — G8929 Other chronic pain: Secondary | ICD-10-CM

## 2014-08-09 DIAGNOSIS — D649 Anemia, unspecified: Secondary | ICD-10-CM

## 2014-08-09 DIAGNOSIS — M545 Low back pain: Secondary | ICD-10-CM

## 2014-08-09 DIAGNOSIS — E8809 Other disorders of plasma-protein metabolism, not elsewhere classified: Secondary | ICD-10-CM

## 2014-08-09 LAB — CBC WITH DIFFERENTIAL/PLATELET
BASO%: 0.4 % (ref 0.0–2.0)
BASOS ABS: 0 10*3/uL (ref 0.0–0.1)
EOS ABS: 0 10*3/uL (ref 0.0–0.5)
EOS%: 0.5 % (ref 0.0–7.0)
HEMATOCRIT: 27.6 % — AB (ref 34.8–46.6)
HGB: 9.2 g/dL — ABNORMAL LOW (ref 11.6–15.9)
LYMPH#: 1.5 10*3/uL (ref 0.9–3.3)
LYMPH%: 42.2 % (ref 14.0–49.7)
MCH: 32.8 pg (ref 25.1–34.0)
MCHC: 33.5 g/dL (ref 31.5–36.0)
MCV: 97.8 fL (ref 79.5–101.0)
MONO#: 0.3 10*3/uL (ref 0.1–0.9)
MONO%: 7.3 % (ref 0.0–14.0)
NEUT%: 49.6 % (ref 38.4–76.8)
NEUTROS ABS: 1.8 10*3/uL (ref 1.5–6.5)
Platelets: 167 10*3/uL (ref 145–400)
RBC: 2.82 10*6/uL — ABNORMAL LOW (ref 3.70–5.45)
RDW: 20.1 % — AB (ref 11.2–14.5)
WBC: 3.7 10*3/uL — ABNORMAL LOW (ref 3.9–10.3)

## 2014-08-09 LAB — HOLD TUBE, BLOOD BANK

## 2014-08-09 NOTE — Telephone Encounter (Signed)
Pt confirmed labs/ov per 05/04 POF, gave pt AVS..... KJ, sent msg to add 5 hour blood and sent msg to get time for MD visit on 05/19.Marland KitchenMarland KitchenMarland Kitchen

## 2014-08-09 NOTE — Progress Notes (Signed)
Hematology and Oncology Follow Up Visit  [name] HIRSCHI [id number] July 12, 1931 81 y.o. 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 She was found to have IgG level of 3430 and an M spike of 2.9 g/dL on 07/20/2014. She has no clear-cut end organ damage except for possible anemia.   Prior Therapy: Status post packed red cell transfusions during hospitalization in February 2016.  Current therapy: Under evaluation for possible active multiple myeloma.  Interim History: [name] presents today for a follow-up visit. Since our last visit, she has been doing relatively fair. She still have some occasional exertional dyspnea and occasional diarrhea. She does not report any new complaints. Her mobility is [name] but limited but able to perform most activities of daily living. She has not reported any bone pain, pathological fracture or neuropathy.  She does not report any headaches, blurry vision, syncope or seizures. She does not report any fevers, chills, sweats. She does not report any chest pain, palpitation orthopnea. Does not report any cough, hemoptysis or hematemesis. She does report occasional nausea but no vomiting no hematochezia or melena. She does not report any frequency urgency or hesitancy. She does report chronic back pain which is unchanged. Remaining review of systems unremarkable.   Medications: I have reviewed the patient's current medications.  Current Outpatient Prescriptions  Medication Sig Dispense Refill  . albuterol-ipratropium (COMBIVENT) 18-103 MCG/ACT inhaler Inhale 1-2 puffs into the lungs every 4 (four) hours as needed for wheezing or shortness of breath.    [name] aspirin 325 MG EC tablet Take 325 mg by mouth daily.    . cholecalciferol (VITAMIN D) 1000 UNITS tablet Take 1,000 Units by mouth daily.    . clonazePAM (KLONOPIN) 1 MG tablet Take 1 mg by mouth at bedtime.    [name]  ezetimibe (ZETIA) 10 MG tablet Take 1 tablet (10 mg total) by mouth at bedtime.    . hydrochlorothiazide (HYDRODIURIL) 25 MG tablet Take 25 mg by mouth daily.  0  . Omega-3 Fatty Acids (FISH OIL) 1200 MG CAPS Take 1,200-2,400 mg by mouth 2 (two) times daily. Take 2 capsules (2400 mg) every morning and 1 capsule (1200 mg) every night    . pantoprazole (PROTONIX) 40 MG tablet Take 1 tablet (40 mg total) by mouth 2 (two) times daily.  0  . polyethylene glycol powder (GLYCOLAX/MIRALAX) powder Take 2 Containers by mouth at bedtime. Mix with 8 oz liquid and drink  0  . prochlorperazine (COMPAZINE) 10 MG tablet Take 10 mg by mouth 3 (three) times daily. As needed for nausea/vomiting  0  . RA SENNA 8.6 MG tablet Take 1 tablet by mouth daily as needed for constipation.   0  . tiotropium (SPIRIVA) 18 MCG inhalation capsule Place 18 mcg into inhaler and inhale daily as needed (shortness of breath).     . traMADol (ULTRAM) 50 MG tablet Take 2 tablets (100 mg total) by mouth every 6 (six) hours as needed (pain). For pain 15 tablet 0  . nitroGLYCERIN (NITROSTAT) 0.4 MG SL tablet Place 0.4 mg under the tongue every 5 (five) minutes as needed for chest pain.     No current facility-administered medications for this visit.     Allergies:  Allergies  Allergen Reactions  . Lexapro [Escitalopram Oxalate] Other (See Comments)    Fatigue   . Soma [Carisoprodol] Other (See Comments)    Fatigue   . Wellbutrin [  Bupropion] Other (See Comments)    "couldn't function and take it"  . Albuterol Sulfate Other (See Comments)    Albuterol HFA inhaler caused nervousness   . Codeine Hives  . Levaquin [Levofloxacin In D5w] Other (See Comments)    Unknown allergic reaction  . Plavix [Clopidogrel Bisulfate] Other (See Comments)    Unknown reaction    Past Medical History, Surgical history, Social history, and Family History were reviewed and updated.   Physical Exam: Blood pressure 140/56, pulse 70, temperature 97.3  F (36.3 C), temperature source Oral, resp. rate 18, height _0  (1.575 m), weight 128 lb 8 oz (58.287 kg). ECOG: 1 General appearance: alert and cooperative Head: Normocephalic, without obvious abnormality Neck: no adenopathy Lymph nodes: Cervical, supraclavicular, and axillary nodes normal. Heart:regular rate and rhythm, S1, S2 normal, no murmur, click, rub or gallop Lung:chest clear, no wheezing, rales, normal symmetric air entry Abdomin: soft, non-tender, without masses or organomegaly EXT:no erythema, induration, or nodules   Lab Results: Lab Results  Component Value Date   WBC 3.9 07/20/2014   HGB 9.6* 07/20/2014   HCT 29.4* 07/20/2014   MCV 97.0 07/20/2014   PLT 157 07/20/2014     Chemistry      Component Value Date/Time   NA 138 07/20/2014 1429   NA 136 05/25/2014 0456   K 3.9 07/20/2014 1429   K 3.8 05/25/2014 0456   CL 104 05/25/2014 0456   CO2 27 07/20/2014 1429   CO2 28 05/25/2014 0456   BUN 20.8 07/20/2014 1429   BUN 9 05/25/2014 0456   CREATININE 1.2* 07/20/2014 1429   CREATININE 1.01 05/25/2014 0456      Component Value Date/Time   CALCIUM 9.4 07/20/2014 1429   CALCIUM 8.3* 05/25/2014 0456   ALKPHOS 60 07/20/2014 1429   ALKPHOS 42 05/21/2014 0920   AST 22 07/20/2014 1429   AST 22 05/21/2014 0920   ALT 13 07/20/2014 1429   ALT 11 05/21/2014 0920   BILITOT 0.62 07/20/2014 1429   BILITOT 0.7 05/21/2014 0920       Radiological Studies:  EXAM: METASTATIC BONE SURVEY  COMPARISON: Chest radiograph 06/22/2014  FINDINGS: No focal calvarial abnormalities.  Diffuse degenerative disc and facet disease changes throughout the cervical, thoracic and lumbar spine without focal spinal abnormality.  Cardiac silhouette enlargement postsurgical with changes of CABG.  Extensive atherosclerotic calcifications aorta and BILATERAL carotid arteries greater on LEFT.  Suspected bone island at the RIGHT femoral head.  No lytic or destructive bone  lesions identified to suggest myeloma involvement.  IMPRESSION: No radiographic evidence of myelomatous involvement.  Scattered degenerative changes of the cervical, thoracic, and lumbar spine.  Extensive atherosclerotic calcifications of the aorta, iliac arteries and BILATERAL carotid arteries.  Impression and Plan:  81 year old woman with the following issues:  1. IgG kappa plasma cell disorder is possible smoldering myeloma versus active multiple myeloma. She has an M spike that is close to 3 g/dL but no clear-cut end organ damage with normal skeletal survey. She does have anemia which certainly could be an indication of active multiple myeloma. The only way to determine whether her anemia is related to chronic disease versus plasma cell disorder occupying her bone marrow would be to do a bone marrow biopsy.  Risks and benefits of this procedure were discussed. Complications include bleeding, pain and infection were discussed and she is agreeable to proceed. This will be set up in the near future.  If there is no evidence of clear-cut plasmacytosis in the  bone marrow then no indication for treatment would be warranted and observation surveillance will be the way to go. Clearly if she has increased plasma cell infiltration in the bone marrow, then active treatment will be warranted.  2. Anemia: Could be related to plasma cell disorder could also be related to anemia of chronic disease or myelodysplasia. Bone marrow biopsy would help in determining the etiology. She is slightly symptomatic today and I will repeat her CBC and transfuse her if needed to. I also discussed with her the role of growth factor support as a potential treatment option in the future.  She will follow-up soon after her bone marrow biopsy to discuss the next step.   Zola Button, MD 5/4/20161:37 PM

## 2014-08-10 ENCOUNTER — Ambulatory Visit (HOSPITAL_COMMUNITY)
Admission: RE | Admit: 2014-08-10 | Discharge: 2014-08-10 | Disposition: A | Discharge status: Home / Self Care | Payer: Medicare Other | Source: Ambulatory Visit | Attending: Oncology | Admitting: Oncology

## 2014-08-11 ENCOUNTER — Telehealth: Payer: Self-pay | Admitting: *Deleted

## 2014-08-11 NOTE — Telephone Encounter (Signed)
Per Dr. Alen Blew, I called and spoke with patient and instructed her that she doesn't need a blood transfusion today. HGB was 9.2. Patient verbalized understanding.

## 2014-08-13 ENCOUNTER — Inpatient Hospital Stay (HOSPITAL_COMMUNITY)
Admission: EM | Admit: 2014-08-13 | Discharge: 2014-08-17 | DRG: 871 | Disposition: A | Discharge status: Home / Self Care | Payer: Medicare Other | Attending: Internal Medicine | Admitting: Internal Medicine

## 2014-08-13 ENCOUNTER — Encounter (HOSPITAL_COMMUNITY): Payer: Self-pay | Admitting: Emergency Medicine

## 2014-08-13 ENCOUNTER — Other Ambulatory Visit (HOSPITAL_COMMUNITY): Payer: Self-pay

## 2014-08-13 ENCOUNTER — Emergency Department (HOSPITAL_COMMUNITY): Payer: Medicare Other

## 2014-08-13 ENCOUNTER — Other Ambulatory Visit: Payer: Self-pay

## 2014-08-13 DIAGNOSIS — Z9841 Cataract extraction status, right eye: Secondary | ICD-10-CM

## 2014-08-13 DIAGNOSIS — G8929 Other chronic pain: Secondary | ICD-10-CM | POA: Diagnosis present

## 2014-08-13 DIAGNOSIS — I2510 Atherosclerotic heart disease of native coronary artery without angina pectoris: Secondary | ICD-10-CM | POA: Diagnosis not present

## 2014-08-13 DIAGNOSIS — D696 Thrombocytopenia, unspecified: Secondary | ICD-10-CM | POA: Diagnosis present

## 2014-08-13 DIAGNOSIS — E785 Hyperlipidemia, unspecified: Secondary | ICD-10-CM | POA: Diagnosis present

## 2014-08-13 DIAGNOSIS — Z6824 Body mass index (BMI) 24.0-24.9, adult: Secondary | ICD-10-CM | POA: Diagnosis not present

## 2014-08-13 DIAGNOSIS — N183 Chronic kidney disease, stage 3 (moderate): Secondary | ICD-10-CM | POA: Diagnosis not present

## 2014-08-13 DIAGNOSIS — E440 Moderate protein-calorie malnutrition: Secondary | ICD-10-CM | POA: Diagnosis not present

## 2014-08-13 DIAGNOSIS — D649 Anemia, unspecified: Secondary | ICD-10-CM | POA: Diagnosis present

## 2014-08-13 DIAGNOSIS — I252 Old myocardial infarction: Secondary | ICD-10-CM

## 2014-08-13 DIAGNOSIS — A419 Sepsis, unspecified organism: Principal | ICD-10-CM

## 2014-08-13 DIAGNOSIS — Z951 Presence of aortocoronary bypass graft: Secondary | ICD-10-CM

## 2014-08-13 DIAGNOSIS — Z885 Allergy status to narcotic agent status: Secondary | ICD-10-CM

## 2014-08-13 DIAGNOSIS — N390 Urinary tract infection, site not specified: Secondary | ICD-10-CM | POA: Diagnosis present

## 2014-08-13 DIAGNOSIS — N189 Chronic kidney disease, unspecified: Secondary | ICD-10-CM

## 2014-08-13 DIAGNOSIS — Z7982 Long term (current) use of aspirin: Secondary | ICD-10-CM

## 2014-08-13 DIAGNOSIS — E43 Unspecified severe protein-calorie malnutrition: Secondary | ICD-10-CM | POA: Diagnosis not present

## 2014-08-13 DIAGNOSIS — M549 Dorsalgia, unspecified: Secondary | ICD-10-CM | POA: Diagnosis present

## 2014-08-13 DIAGNOSIS — I129 Hypertensive chronic kidney disease with stage 1 through stage 4 chronic kidney disease, or unspecified chronic kidney disease: Secondary | ICD-10-CM | POA: Diagnosis present

## 2014-08-13 DIAGNOSIS — R0602 Shortness of breath: Secondary | ICD-10-CM

## 2014-08-13 DIAGNOSIS — Z888 Allergy status to other drugs, medicaments and biological substances status: Secondary | ICD-10-CM | POA: Diagnosis not present

## 2014-08-13 DIAGNOSIS — N289 Disorder of kidney and ureter, unspecified: Secondary | ICD-10-CM

## 2014-08-13 DIAGNOSIS — K219 Gastro-esophageal reflux disease without esophagitis: Secondary | ICD-10-CM | POA: Diagnosis present

## 2014-08-13 DIAGNOSIS — Z87891 Personal history of nicotine dependence: Secondary | ICD-10-CM

## 2014-08-13 DIAGNOSIS — M109 Gout, unspecified: Secondary | ICD-10-CM | POA: Diagnosis present

## 2014-08-13 DIAGNOSIS — Z90710 Acquired absence of both cervix and uterus: Secondary | ICD-10-CM

## 2014-08-13 DIAGNOSIS — W19XXXA Unspecified fall, initial encounter: Secondary | ICD-10-CM

## 2014-08-13 DIAGNOSIS — I25810 Atherosclerosis of coronary artery bypass graft(s) without angina pectoris: Secondary | ICD-10-CM | POA: Diagnosis present

## 2014-08-13 DIAGNOSIS — E871 Hypo-osmolality and hyponatremia: Secondary | ICD-10-CM | POA: Diagnosis not present

## 2014-08-13 DIAGNOSIS — K5900 Constipation, unspecified: Secondary | ICD-10-CM | POA: Diagnosis not present

## 2014-08-13 DIAGNOSIS — F329 Major depressive disorder, single episode, unspecified: Secondary | ICD-10-CM | POA: Diagnosis present

## 2014-08-13 DIAGNOSIS — R531 Weakness: Secondary | ICD-10-CM

## 2014-08-13 DIAGNOSIS — I10 Essential (primary) hypertension: Secondary | ICD-10-CM | POA: Diagnosis not present

## 2014-08-13 DIAGNOSIS — Z9842 Cataract extraction status, left eye: Secondary | ICD-10-CM

## 2014-08-13 DIAGNOSIS — R509 Fever, unspecified: Secondary | ICD-10-CM | POA: Diagnosis present

## 2014-08-13 DIAGNOSIS — J449 Chronic obstructive pulmonary disease, unspecified: Secondary | ICD-10-CM | POA: Diagnosis not present

## 2014-08-13 DIAGNOSIS — Z1624 Resistance to multiple antibiotics: Secondary | ICD-10-CM | POA: Diagnosis not present

## 2014-08-13 DIAGNOSIS — Y92009 Unspecified place in unspecified non-institutional (private) residence as the place of occurrence of the external cause: Secondary | ICD-10-CM

## 2014-08-13 DIAGNOSIS — J42 Unspecified chronic bronchitis: Secondary | ICD-10-CM | POA: Diagnosis not present

## 2014-08-13 LAB — BASIC METABOLIC PANEL
Anion gap: 9 (ref 5–15)
BUN: 21 mg/dL — ABNORMAL HIGH (ref 6–20)
CO2: 25 mmol/L (ref 22–32)
Calcium: 9.5 mg/dL (ref 8.9–10.3)
Chloride: 99 mmol/L — ABNORMAL LOW (ref 101–111)
Creatinine, Ser: 1.41 mg/dL — ABNORMAL HIGH (ref 0.44–1.00)
GFR calc non Af Amer: 34 mL/min — ABNORMAL LOW (ref >60)
GFR, EST AFRICAN AMERICAN: 39 mL/min — AB (ref >60)
Glucose, Bld: 152 mg/dL — ABNORMAL HIGH (ref 70–99)
POTASSIUM: 3.7 mmol/L (ref 3.5–5.1)
SODIUM: 133 mmol/L — AB (ref 135–145)

## 2014-08-13 LAB — URINE MICROSCOPIC-ADD ON

## 2014-08-13 LAB — I-STAT CHEM 8, ED
BUN: 25 mg/dL — AB (ref 6–20)
CALCIUM ION: 1.14 mmol/L (ref 1.13–1.30)
CHLORIDE: 99 mmol/L — AB (ref 101–111)
Creatinine, Ser: 1.4 mg/dL — ABNORMAL HIGH (ref 0.44–1.00)
GLUCOSE: 156 mg/dL — AB (ref 70–99)
HCT: 31 % — ABNORMAL LOW (ref 36.0–46.0)
Hemoglobin: 10.5 g/dL — ABNORMAL LOW (ref 12.0–15.0)
Potassium: 3.9 mmol/L (ref 3.5–5.1)
Sodium: 140 mmol/L (ref 135–145)
TCO2: 22 mmol/L (ref 0–100)

## 2014-08-13 LAB — URINALYSIS, ROUTINE W REFLEX MICROSCOPIC
Glucose, UA: NEGATIVE mg/dL
Ketones, ur: 15 mg/dL — AB
Nitrite: NEGATIVE
PH: 5.5 (ref 5.0–8.0)
PROTEIN: 100 mg/dL — AB
Specific Gravity, Urine: 1.021 (ref 1.005–1.030)
Urobilinogen, UA: 1 mg/dL (ref 0.0–1.0)

## 2014-08-13 LAB — I-STAT TROPONIN, ED: TROPONIN I, POC: 0.05 ng/mL (ref 0.00–0.08)

## 2014-08-13 LAB — CBC
HEMATOCRIT: 26 % — AB (ref 36.0–46.0)
HEMOGLOBIN: 8.6 g/dL — AB (ref 12.0–15.0)
MCH: 32.7 pg (ref 26.0–34.0)
MCHC: 33.1 g/dL (ref 30.0–36.0)
MCV: 98.9 fL (ref 78.0–100.0)
Platelets: 132 10*3/uL — ABNORMAL LOW (ref 150–400)
RBC: 2.63 MIL/uL — AB (ref 3.87–5.11)
RDW: 19.1 % — ABNORMAL HIGH (ref 11.5–15.5)
WBC: 5.5 10*3/uL (ref 4.0–10.5)

## 2014-08-13 LAB — POC OCCULT BLOOD, ED: Fecal Occult Bld: NEGATIVE

## 2014-08-13 LAB — LIPASE, BLOOD: Lipase: 21 U/L — ABNORMAL LOW (ref 22–51)

## 2014-08-13 LAB — I-STAT CG4 LACTIC ACID, ED: LACTIC ACID, VENOUS: 2.85 mmol/L — AB (ref 0.5–2.0)

## 2014-08-13 LAB — PROTIME-INR
INR: 1.35 (ref 0.00–1.49)
PROTHROMBIN TIME: 16.8 s — AB (ref 11.6–15.2)

## 2014-08-13 LAB — HEPATIC FUNCTION PANEL
ALK PHOS: 69 U/L (ref 38–126)
ALT: 25 U/L (ref 14–54)
AST: 47 U/L — ABNORMAL HIGH (ref 15–41)
Albumin: 3.5 g/dL (ref 3.5–5.0)
Bilirubin, Direct: 0.2 mg/dL (ref 0.1–0.5)
Indirect Bilirubin: 0.7 mg/dL (ref 0.3–0.9)
TOTAL PROTEIN: 10.3 g/dL — AB (ref 6.5–8.1)
Total Bilirubin: 0.9 mg/dL (ref 0.3–1.2)

## 2014-08-13 LAB — CG4 I-STAT (LACTIC ACID): Lactic Acid, Venous: 1.91 mmol/L (ref 0.5–2.0)

## 2014-08-13 LAB — APTT: aPTT: 31 seconds (ref 24–37)

## 2014-08-13 LAB — LACTIC ACID, PLASMA
Lactic Acid, Venous: 2.3 mmol/L (ref 0.5–2.0)
Lactic Acid, Venous: 2.1 mmol/L (ref 0.5–2.0)

## 2014-08-13 LAB — PROCALCITONIN: PROCALCITONIN: 6.63 ng/mL

## 2014-08-13 LAB — BRAIN NATRIURETIC PEPTIDE: B Natriuretic Peptide: 150 pg/mL — ABNORMAL HIGH (ref 0.0–100.0)

## 2014-08-13 LAB — CBG MONITORING, ED: Glucose-Capillary: 117 mg/dL — ABNORMAL HIGH (ref 70–99)

## 2014-08-13 MED ORDER — PANTOPRAZOLE SODIUM 40 MG PO TBEC
40.0000 mg | DELAYED_RELEASE_TABLET | Freq: Two times a day (BID) | ORAL | Status: DC
  Administered 2014-08-13 – 2014-08-17 (×8): 40 mg via ORAL
  Filled 2014-08-13 (×8): qty 1

## 2014-08-13 MED ORDER — ONDANSETRON HCL 4 MG PO TABS: 4.0000 mg | ORAL_TABLET | Freq: Four times a day (QID) | ORAL | Status: DC | PRN

## 2014-08-13 MED ORDER — PROCHLORPERAZINE MALEATE 10 MG PO TABS
10.0000 mg | ORAL_TABLET | Freq: Three times a day (TID) | ORAL | Status: DC | PRN
  Filled 2014-08-13: qty 1

## 2014-08-13 MED ORDER — SODIUM CHLORIDE 0.9 % IV BOLUS (SEPSIS)
1000.0000 mL | Freq: Once | INTRAVENOUS | Status: AC
  Administered 2014-08-13: 1000 mL via INTRAVENOUS

## 2014-08-13 MED ORDER — ONDANSETRON HCL 4 MG/2ML IJ SOLN: 4.0000 mg | Freq: Four times a day (QID) | INTRAMUSCULAR | Status: DC | PRN

## 2014-08-13 MED ORDER — SODIUM CHLORIDE 0.9 % IV SOLN
INTRAVENOUS | Status: AC
  Administered 2014-08-13: 17:00:00 via INTRAVENOUS

## 2014-08-13 MED ORDER — SODIUM CHLORIDE 0.9 % IJ SOLN
3.0000 mL | Freq: Two times a day (BID) | INTRAMUSCULAR | Status: DC
  Administered 2014-08-13 – 2014-08-16 (×6): 3 mL via INTRAVENOUS

## 2014-08-13 MED ORDER — EZETIMIBE 10 MG PO TABS
10.0000 mg | ORAL_TABLET | Freq: Every day | ORAL | Status: DC
  Administered 2014-08-13 – 2014-08-16 (×4): 10 mg via ORAL
  Filled 2014-08-13 (×5): qty 1

## 2014-08-13 MED ORDER — CLONAZEPAM 0.5 MG PO TABS
0.2500 mg | ORAL_TABLET | Freq: Every day | ORAL | Status: DC
  Administered 2014-08-13 – 2014-08-16 (×4): 0.25 mg via ORAL
  Filled 2014-08-13 (×4): qty 1

## 2014-08-13 MED ORDER — TIOTROPIUM BROMIDE MONOHYDRATE 18 MCG IN CAPS
18.0000 ug | ORAL_CAPSULE | Freq: Every day | RESPIRATORY_TRACT | Status: DC | PRN
Start: 2014-08-13 — End: 2014-08-17

## 2014-08-13 MED ORDER — CEFTRIAXONE SODIUM IN DEXTROSE 20 MG/ML IV SOLN
1.0000 g | INTRAVENOUS | Status: DC
  Administered 2014-08-14 – 2014-08-15 (×2): 1 g via INTRAVENOUS
  Filled 2014-08-13 (×2): qty 50

## 2014-08-13 MED ORDER — DEXTROSE 5 % IV SOLN
2.0000 g | Freq: Once | INTRAVENOUS | Status: AC
  Administered 2014-08-13: 2 g via INTRAVENOUS
  Filled 2014-08-13: qty 2

## 2014-08-13 MED ORDER — OXYCODONE HCL 5 MG PO TABS
5.0000 mg | ORAL_TABLET | ORAL | Status: DC | PRN
  Administered 2014-08-15 – 2014-08-16 (×3): 5 mg via ORAL
  Filled 2014-08-13 (×3): qty 1

## 2014-08-13 MED ORDER — ACETAMINOPHEN 325 MG PO TABS
650.0000 mg | ORAL_TABLET | Freq: Once | ORAL | Status: AC
  Administered 2014-08-13: 650 mg via ORAL
  Filled 2014-08-13: qty 2

## 2014-08-13 MED ORDER — POLYETHYLENE GLYCOL 3350 17 G PO PACK
17.0000 g | PACK | Freq: Every day | ORAL | Status: DC
  Administered 2014-08-13 – 2014-08-16 (×4): 17 g via ORAL
  Filled 2014-08-13 (×5): qty 1

## 2014-08-13 MED ORDER — ACETAMINOPHEN 325 MG PO TABS: 650.0000 mg | ORAL_TABLET | Freq: Four times a day (QID) | ORAL | Status: DC | PRN

## 2014-08-13 MED ORDER — SODIUM CHLORIDE 0.9 % IV BOLUS (SEPSIS)
500.0000 mL | Freq: Once | INTRAVENOUS | Status: AC
  Administered 2014-08-13: 500 mL via INTRAVENOUS

## 2014-08-13 MED ORDER — ASPIRIN EC 325 MG PO TBEC
325.0000 mg | DELAYED_RELEASE_TABLET | Freq: Every day | ORAL | Status: DC
  Administered 2014-08-13 – 2014-08-17 (×5): 325 mg via ORAL
  Filled 2014-08-13 (×5): qty 1

## 2014-08-13 MED ORDER — ASPIRIN 81 MG PO CHEW
324.0000 mg | CHEWABLE_TABLET | Freq: Once | ORAL | Status: AC
  Administered 2014-08-13: 324 mg via ORAL
  Filled 2014-08-13: qty 4

## 2014-08-13 MED ORDER — ACETAMINOPHEN 650 MG RE SUPP: 650.0000 mg | Freq: Four times a day (QID) | RECTAL | Status: DC | PRN

## 2014-08-13 NOTE — Progress Notes (Signed)
MD Maryland Pink paged to notify him of patient bolus completion and current VS: Temp 98.37F, BP 119/41, HR 51, RR 20, SPO2 95% on 3L [place] and RRT nurse called, notified, and currently en route to evaluate patient per orders placed at 1616 on admission to 5W.

## 2014-08-13 NOTE — Progress Notes (Addendum)
CRITICAL VALUE ALERT  Critical value received:  Lactic Acid 2.1  Date of notification:  08/13/2014  Time of notification:  2047  Critical value read back: Yes  Nurse who received alert:  Everardo All RN  MD notified (1st page):  Dr. Hilbert Bible  Time of first page:  2049  MD notified (2nd page): Dr. Hilbert Bible  Time of second page: 2056  Responding MD:  Awaiting response  Time MD responded:  Awaiting response

## 2014-08-13 NOTE — ED Notes (Signed)
Pt returned from CT °

## 2014-08-13 NOTE — Progress Notes (Signed)
Alerted to patients admission with Adult Focused Sepsis Order set.  Patient awake and alert - w/d  - oriented.  No distress.  BP 129/45 HR 54 RR 16 O2 sats 98% on n/c.  Denies pain or SOB.  LA trending down.  Will place on sepsis radar - urosepsis .   Discussed with RN Frederico Hamman.  To call as needed.

## 2014-08-13 NOTE — Progress Notes (Signed)
MD Maryland Pink paged, patient admitted to [facility].

## 2014-08-13 NOTE — ED Provider Notes (Addendum)
Medical screening examination/treatment/procedure(s) were conducted as a shared visit with non-physician practitioner(s) and myself.  I personally evaluated the patient during the encounter.   EKG Interpretation None      Results for orders placed or performed during the hospital encounter of 08/13/14  CBC  Result Value Ref Range   WBC 5.5 4.0 - 10.5 K/uL   RBC 2.63 (L) 3.87 - 5.11 MIL/uL   Hemoglobin 8.6 (L) 12.0 - 15.0 g/dL   HCT 06.3 (L) 16.7 - 77.3 %   MCV 98.9 78.0 - 100.0 fL   MCH 32.7 26.0 - 34.0 pg   MCHC 33.1 30.0 - 36.0 g/dL   RDW 17.9 (H) 15.2 - 48.4 %   Platelets 132 (L) 150 - 400 K/uL  Basic metabolic panel  Result Value Ref Range   Sodium 133 (L) 135 - 145 mmol/L   Potassium 3.7 3.5 - 5.1 mmol/L   Chloride 99 (L) 101 - 111 mmol/L   CO2 25 22 - 32 mmol/L   Glucose, Bld 152 (H) 70 - 99 mg/dL   BUN 21 (H) 6 - 20 mg/dL   Creatinine, Ser 9.48 (H) 0.44 - 1.00 mg/dL   Calcium 9.5 8.9 - 35.5 mg/dL   GFR calc non Af Amer 34 (L) >60 mL/min   GFR calc Af Amer 39 (L) >60 mL/min   Anion gap 9 5 - 15  Hepatic function panel  Result Value Ref Range   Total Protein 10.3 (H) 6.5 - 8.1 g/dL   Albumin 3.5 3.5 - 5.0 g/dL   AST 47 (H) 15 - 41 U/L   ALT 25 14 - 54 U/L   Alkaline Phosphatase 69 38 - 126 U/L   Total Bilirubin 0.9 0.3 - 1.2 mg/dL   Bilirubin, Direct 0.2 0.1 - 0.5 mg/dL   Indirect Bilirubin 0.7 0.3 - 0.9 mg/dL  Lipase, blood  Result Value Ref Range   Lipase 21 (L) 22 - 51 U/L  Urinalysis, Routine w reflex microscopic  Result Value Ref Range   Color, Urine YELLOW YELLOW   APPearance TURBID (A) CLEAR   Specific Gravity, Urine 1.021 1.005 - 1.030   pH 5.5 5.0 - 8.0   Glucose, UA NEGATIVE NEGATIVE mg/dL   Hgb urine dipstick LARGE (A) NEGATIVE   Bilirubin Urine SMALL (A) NEGATIVE   Ketones, ur 15 (A) NEGATIVE mg/dL   Protein, ur 997 (A) NEGATIVE mg/dL   Urobilinogen, UA 1.0 0.0 - 1.0 mg/dL   Nitrite NEGATIVE NEGATIVE   Leukocytes, UA LARGE (A) NEGATIVE   Brain natriuretic peptide  Result Value Ref Range   B Natriuretic Peptide 150.0 (H) 0.0 - 100.0 pg/mL  Urine microscopic-add on  Result Value Ref Range   Squamous Epithelial / LPF FEW (A) RARE   WBC, UA TOO NUMEROUS TO COUNT <3 WBC/hpf   RBC / HPF 7-10 <3 RBC/hpf   Bacteria, UA MANY (A) RARE  CBG, ED  Result Value Ref Range   Glucose-Capillary 117 (H) 70 - 99 mg/dL  I-stat troponin, ED  Result Value Ref Range   Troponin i, poc 0.05 0.00 - 0.08 ng/mL   Comment 3          I-Stat CG4 Lactic Acid, ED  Result Value Ref Range   Lactic Acid, Venous 2.85 (HH) 0.5 - 2.0 mmol/L   Comment NOTIFIED PHYSICIAN   I-Stat Chem 8, ED  Result Value Ref Range   Sodium 140 135 - 145 mmol/L   Potassium 3.9 3.5 - 5.1 mmol/L  Chloride 99 (L) 101 - 111 mmol/L   BUN 25 (H) 6 - 20 mg/dL   Creatinine, Ser 1.40 (H) 0.44 - 1.00 mg/dL   Glucose, Bld 156 (H) 70 - 99 mg/dL   Calcium, Ion 1.14 1.13 - 1.30 mmol/L   TCO2 22 0 - 100 mmol/L   Hemoglobin 10.5 (L) 12.0 - 15.0 g/dL   HCT 31.0 (L) 36.0 - 46.0 %  POC occult blood, ED Provider will collect  Result Value Ref Range   Fecal Occult Bld NEGATIVE NEGATIVE  Type and screen  Result Value Ref Range   ABO/RH(D) A NEG    Antibody Screen NEG    Sample Expiration 08/16/2014    Ct Head Wo Contrast  08/13/2014   CLINICAL DATA:  Fall while standing, weakness  EXAM: CT HEAD WITHOUT CONTRAST  TECHNIQUE: Contiguous axial images were obtained from the base of the skull through the vertex without intravenous contrast.  COMPARISON:  None.  FINDINGS: Bony calvarium is intact. No gross soft tissue abnormality is noted. Paranasal sinuses and mastoid air cells are well aerated. No findings to suggest acute hemorrhage, acute infarction or space-occupying mass lesion are noted. Very mild areas of prior ischemic change are seen. No acute abnormality is noted.  IMPRESSION: Mild chronic white matter ischemic changes noted. No acute abnormality is noted.   Electronically Signed    By: Inez Catalina M.D.   On: 08/13/2014 14:59   Dg Chest Port 1 View  08/13/2014   CLINICAL DATA:  Per ED note: Per [place] [facility], pt is from home, got up this morning to go to the bathroom and fell from a standing position Forward to her knees and hands due to weakness. Pt denies injury, no neck or back pain. Pt mildly febrile 100.3, and difficulty urinating for several weeks. Pt also has hx of enemia. Pt has had two or three transfusions. Scheduled for one Friday and they called her to say her hemoglobin was up and she didn't need it. CBG 117. AAOX4, c/o pain below her belly button. 68 in R handH/O CABG X 2 in 2005, COPD, HTN, cardiac cath in 2015.  EXAM: PORTABLE CHEST - 1 VIEW  COMPARISON:  06/22/2014  FINDINGS: Cardiac silhouette is mildly enlarged. Changes from CABG surgery are stable. No mediastinal or hilar masses.  No lung consolidation or edema. No pleural effusion or pneumothorax.  Bony thorax is demineralized but grossly intact.  There are carotid calcifications in the neck.  IMPRESSION: No acute cardiopulmonary disease.   Electronically Signed   By: Lajean Manes M.D.   On: 08/13/2014 12:53   Dg Bone Survey Met  07/20/2014   CLINICAL DATA:  Plasma cell disorder question myeloma lesions, upper back pain for couple weeks, COPD, hypertension, heart murmur  EXAM: METASTATIC BONE SURVEY  COMPARISON:  Chest radiograph 06/22/2014  FINDINGS: No focal calvarial abnormalities.  Diffuse degenerative disc and facet disease changes throughout the cervical, thoracic and lumbar spine without focal spinal abnormality.  Cardiac silhouette enlargement postsurgical with changes of CABG.  Extensive atherosclerotic calcifications aorta and BILATERAL carotid arteries greater on LEFT.  Suspected bone island at the RIGHT femoral head.  No lytic or destructive bone lesions identified to suggest myeloma involvement.  IMPRESSION: No radiographic evidence of myelomatous involvement.  Scattered degenerative changes of the  cervical, thoracic, and lumbar spine.  Extensive atherosclerotic calcifications of the aorta, iliac arteries and BILATERAL carotid arteries.   Electronically Signed   By: Lavonia Dana M.D.   On: 07/20/2014  16:22    Patient without tachycardia or hypotension however with fever. Workup seems to be consistent with urosepsis. Patient started on Rocephin for this urine culture sent. Rest of workup without any specific findings.  Patient brought in by EMS from [facility]. Patient got up this morning and fell from a standing position. For to her knees and hands due to weakness. No loss of consciousness. Patient with a fever to 100.3 at that time. Fevers here up to 101. Patient's had difficulty urinating for several weeks. Patient was told on Friday that her hemoglobin was low. Today the reading is 8.6 below threshold for transfusion.  Patient will need admission for signs of a urinary tract infection and 90 early urosepsis. Patients like to get acid was elevated.  Patient's physical exam alert and responsive to questions here. Abdomen soft nontender lungs are clear bilaterally heart regular rate and rhythm without murmurs.  Fredia Sorrow, MD 08/13/14 1507   Mid-level will of Bill for critical care time. Patient was given IV fluids as well as antibiotics. For the early septic type picture. Based on her fever could be considered sepsis.   Medical screening examination/treatment/procedure(s) were conducted as a shared visit with non-physician practitioner(s) and myself.  I personally evaluated the patient during the encounter.   EKG Interpretation None       ED ECG REPORT   Date: 08/13/2014  Rate: 75  Rhythm: normal sinus rhythm  QRS Axis: right  Intervals: normal  ST/T Wave abnormalities: nonspecific T wave changes  Conduction Disutrbances:none  Narrative Interpretation:   Old EKG Reviewed: none available  I have personally reviewed the EKG tracing and agree with the computerized  printout as noted.   Fredia Sorrow, MD 08/13/14 1540

## 2014-08-13 NOTE — Progress Notes (Signed)
Critical Lactic Acid of 2.1 called to floor, LA trending down, Dr. Hilbert Bible notified and aware, no new orders at this time, patient with no complaints, VSS, afebrile, IV fluids maintained at 50 mL/Hr, will continue to monitor closely.

## 2014-08-13 NOTE — Progress Notes (Signed)
NURSING PROGRESS NOTE  [name] [id number] Admission Data: 08/13/2014 5:10 PM Attending Provider: Bonnielee Haff, MD KGU:RKYHCW,CBJSEG, MD Code Status: Full  Allergies:  Lexapro; Soma; Wellbutrin; Albuterol sulfate; Codeine; Levaquin; Plavix; and Statins Past Medical History:   has a past medical history of COPD (chronic obstructive pulmonary disease); Gout; Coronary atherosclerosis of native coronary artery (02/05/2012); Hypertension; High blood cholesterol; Heart murmur; Anginal pain; Myocardial infarction (1990's; 2000's); Pneumonia; Chronic bronchitis; Shortness of breath; History of blood transfusion; External bleeding hemorrhoids; Chronic back pain; Depression; and CAD (coronary artery disease) of artery bypass graft (02/09/14). Past Surgical History:   has past surgical history that includes Appendectomy (? 1970's); Ectopic pregnancy surgery (1970's); Abdominal hysterectomy (? 1970's); Cholecystectomy (~ 2010); Cataract extraction w/ intraocular lens  implant, bilateral (3151'V-6160'V); Coronary angioplasty with stent (2005); Coronary artery bypass graft (2005); Cardiac catheterization (02/09/14); left heart catheterization with coronary/graft angiogram (N/A, 02/09/2014); and Esophagogastroduodenoscopy (egd) with propofol (Left, 05/22/2014). Social History:   reports that she quit smoking about 3 years ago. Her smoking use included Cigarettes. She has a 50 pack-year smoking history. She has never used smokeless tobacco. She reports that she drinks alcohol. She reports that she does not use illicit drugs.  [name] is a 81 y.o. female patient admitted from ED:   Last Documented Vital Signs: Blood pressure 119/41, pulse 60, temperature 98.8 F (37.1 C), temperature source Oral, resp. rate 20, height 5\' 2"  (1.575 m), weight 59.875 kg (132 lb), SpO2 95 %.  Cardiac Monitoring: Box # 9 in place. Cardiac monitor yields:sinus bradycardia.  IV Fluids:  IV in place, occlusive dsg intact  without redness, IV cath antecubital left, condition patent and no redness normal saline.   Skin: WDL  Patient/Family orientated to room. Information packet given to patient/family. Admission inpatient armband information verified with patient/family to include name and date of birth and placed on patient arm. Side rails up x 2, fall assessment and education completed with patient/family. Patient/family able to verbalize understanding of risk associated with falls and verbalized understanding to call for assistance before getting out of bed. Call light within reach. Patient/family able to voice and demonstrate understanding of unit orientation instructions.    Will continue to evaluate and treat per MD orders.   Hendricks Limes RN, BS, BSN

## 2014-08-13 NOTE — H&P (Signed)
[facility] History and Physical  [name] TDV:[id number] DOB: [birth year] DOA: 08/13/2014   PCP: [name], MD  Specialists: Followed by Dr. Alen Blew with hematology-oncology for evaluation of anemia and questionable myeloma. Dr. Daneen Schick is her cardiologist.  Chief Complaint: Fall  HPI: [name] is a 81 y.o. female with a past medical history of coronary artery disease, emphysema, chronic kidney disease stage III, hypertension, chronic anemia who lives at home with her daughter. She was in her usual state of health until earlier today when she was going to the bathroom, felt dizzy and fell. She denies any injuries. She did not pass out. She had an episode of urinary incontinence at that time. She's had a fever. She denies any nausea, vomiting. She's had a few loose stools. No blood in the stool. She does admit to burning sensation with urination and frequent urination of small quantity. She's also felt a little short of breath at times. She denies any history of frequent falls. She usually independent with daily activities.  Home Medications: Prior to Admission medications   Medication Sig Start Date End Date Taking? Authorizing Provider  albuterol-ipratropium (COMBIVENT) 18-103 MCG/ACT inhaler Inhale 1-2 puffs into the lungs every 4 (four) hours as needed for wheezing or shortness of breath.    Historical Provider, MD  aspirin 325 MG EC tablet Take 325 mg by mouth daily.    Historical Provider, MD  cholecalciferol (VITAMIN D) 1000 UNITS tablet Take 1,000 Units by mouth daily.    Historical Provider, MD  clonazePAM (KLONOPIN) 1 MG tablet Take 1 mg by mouth at bedtime. 08/02/14   Historical Provider, MD  ezetimibe (ZETIA) 10 MG tablet Take 1 tablet (10 mg total) by mouth at bedtime. 05/25/14   Hosie Poisson, MD  hydrochlorothiazide (HYDRODIURIL) 25 MG tablet Take 25 mg by mouth daily. 05/16/14   Historical Provider, MD  nitroGLYCERIN (NITROSTAT) 0.4 MG SL tablet Place 0.4  mg under the tongue every 5 (five) minutes as needed for chest pain.    Historical Provider, MD  Omega-3 Fatty Acids (FISH OIL) 1200 MG CAPS Take 1,200-2,400 mg by mouth 2 (two) times daily. Take 2 capsules (2400 mg) every morning and 1 capsule (1200 mg) every night    Historical Provider, MD  pantoprazole (PROTONIX) 40 MG tablet Take 1 tablet (40 mg total) by mouth 2 (two) times daily. 05/25/14   Hosie Poisson, MD  polyethylene glycol powder (GLYCOLAX/MIRALAX) powder Take 2 Containers by mouth at bedtime. Mix with 8 oz liquid and drink 05/16/14   Historical Provider, MD  prochlorperazine (COMPAZINE) 10 MG tablet Take 10 mg by mouth 3 (three) times daily. As needed for nausea/vomiting 06/29/14   Historical Provider, MD  RA SENNA 8.6 MG tablet Take 1 tablet by mouth daily as needed for constipation.  02/07/14   Historical Provider, MD  tiotropium (SPIRIVA) 18 MCG inhalation capsule Place 18 mcg into inhaler and inhale daily as needed (shortness of breath).     Historical Provider, MD  traMADol (ULTRAM) 50 MG tablet Take 2 tablets (100 mg total) by mouth every 6 (six) hours as needed (pain). For pain 05/25/14   Hosie Poisson, MD    Allergies:  Allergies  Allergen Reactions  . Lexapro [Escitalopram Oxalate] Other (See Comments)    Fatigue   . Soma [Carisoprodol] Other (See Comments)    Fatigue   . Wellbutrin [Bupropion] Other (See Comments)    "couldn't function and take it"  . Albuterol Sulfate Other (See Comments)  Albuterol HFA inhaler caused nervousness   . Codeine Hives  . Levaquin [Levofloxacin In D5w] Other (See Comments)    Unknown allergic reaction  . Plavix [Clopidogrel Bisulfate] Other (See Comments)    Unknown reaction  . Statins     Past Medical History: Past Medical History  Diagnosis Date  . COPD (chronic obstructive pulmonary disease)   . Gout   . Coronary atherosclerosis of native coronary artery 02/05/2012  . Hypertension   . High blood cholesterol   . Heart murmur      "all my life" (02/05/2012)  . Anginal pain   . Myocardial infarction 1990's; 2000's    "2 total, I think" (02/05/2012)  . Pneumonia     "several times" (02/05/2012)  . Chronic bronchitis   . Shortness of breath     "sometimes when I'm laying down; always when I do too much" (02/05/2012)  . History of blood transfusion   . External bleeding hemorrhoids     "act up at times" (02/05/2012)  . Chronic back pain     "all over" (02/05/2012)  . Depression   . CAD (coronary artery disease) of artery bypass graft 02/09/14    atretic LIMA    Past Surgical History  Procedure Laterality Date  . Appendectomy  ? 1970's  . Ectopic pregnancy surgery  1970's  . Abdominal hysterectomy  ? 1970's    "partial 1st time; complete 2nd" (02/05/2012)  . Cholecystectomy  ~ 2010  . Cataract extraction w/ intraocular lens  implant, bilateral  [id number]  . Coronary angioplasty with stent placement  2005  . Coronary artery bypass graft  2005    CABG X2  . Cardiac catheterization  02/09/14    atretic LIMA,  patent VG-dRCA and Total occlusion of the native RCA proximally prior to remotely placement RCA stent, with mild proximal 30% LAD stenosis and 30% distal circumflex stenoses.  EF 55%.  . Left heart catheterization with coronary/graft angiogram N/A 02/09/2014    Procedure: LEFT HEART CATHETERIZATION WITH Beatrix Fetters;  Surgeon: Troy Sine, MD;  Location: [facility] CATH LAB;  Service: Cardiovascular;  Laterality: N/A;  . Esophagogastroduodenoscopy (egd) with propofol Left 05/22/2014    Procedure: ESOPHAGOGASTRODUODENOSCOPY (EGD) WITH PROPOFOL;  Surgeon: Arta Silence, MD;  Location: [facility] ENDOSCOPY;  Service: Endoscopy;  Laterality: Left;    Social History: Patient lives with her daughter. Quit smoking 3 years ago. Occasional wine use. Otherwise, independent with daily activities.  Family History:  Family History  Problem Relation Age of Onset  . Anemia Neg Hx   . Arrhythmia Neg Hx   . Asthma Neg  Hx   . Clotting disorder Neg Hx   . Fainting Neg Hx   . Heart attack Neg Hx   . Heart disease Neg Hx   . Heart failure Neg Hx   . Hyperlipidemia Neg Hx   . Hypertension Neg Hx   . Aneurysm    . CVA    . CVA Mother   . Aneurysm Father      Review of Systems - History obtained from the patient General ROS: positive for  - chills, fatigue and fever Psychological ROS: negative Ophthalmic ROS: negative ENT ROS: negative Allergy and Immunology ROS: negative Hematological and Lymphatic ROS: negative Endocrine ROS: negative Respiratory ROS: as in hpi Cardiovascular ROS: as in hpi Gastrointestinal ROS: no abdominal pain, change in bowel habits, or black or bloody stools Genito-Urinary ROS: as in hpi Musculoskeletal ROS: negative Neurological ROS: no TIA or stroke symptoms Dermatological  ROS: negative  Physical Examination  Filed Vitals:   08/13/14 1400 08/13/14 1430 08/13/14 1523 08/13/14 1530  BP: 121/40 134/68  117/38  Pulse: 58 59  60  Temp:   99.3 F (37.4 C)   TempSrc:   Rectal   Resp: $Remo'16 19  19  'fJhDr$ Height:      Weight:      SpO2: 97% 98%  99%    BP 117/38 mmHg  Pulse 60  Temp(Src) 99.3 F (37.4 C) (Rectal)  Resp 19  Ht $R'5\' 2"'aR$  (1.575 m)  Wt 59.875 kg (132 lb)  BMI 24.14 kg/m2  SpO2 99%  General appearance: alert, cooperative, appears stated age and no distress Head: Normocephalic, without obvious abnormality, atraumatic Eyes: conjunctivae/corneas clear. PERRL, EOM's intact.  Throat: lips, mucosa, and tongue normal; teeth and gums normal Neck: no adenopathy, no carotid bruit, no JVD, supple, symmetrical, trachea midline and thyroid not enlarged, symmetric, no tenderness/mass/nodules Back: symmetric, no curvature. ROM normal. No CVA tenderness. Resp: Decreased air entry at the bases with a few crackles. No wheezing. No rhonchi. Cardio: regular rate and rhythm, S1, S2 normal, no murmur, click, rub or gallop GI: Abdomen is soft. Slight tenderness in the suprapubic  area without any rebound, rigidity or guarding. No masses or organomegaly. Bowel sounds are present. Extremities: extremities normal, atraumatic, no cyanosis or edema Pulses: 2+ and symmetric Skin: Skin color, texture, turgor normal. No rashes or lesions Lymph nodes: Cervical, supraclavicular, and axillary nodes normal. Neurologic: Alert and oriented 3. No focal neurological deficits are noted. Tongue is midline. No facial asymmetry. Strength 5-5 bilateral upper and lower extremities.  Laboratory Data: Results for orders placed or performed during the hospital encounter of 08/13/14 (from the past 48 hour(s))  CBC     Status: Abnormal   Collection Time: 08/13/14 12:16 PM  Result Value Ref Range   WBC 5.5 4.0 - 10.5 K/uL   RBC 2.63 (L) 3.87 - 5.11 MIL/uL   Hemoglobin 8.6 (L) 12.0 - 15.0 g/dL   HCT 26.0 (L) 36.0 - 46.0 %   MCV 98.9 78.0 - 100.0 fL   MCH 32.7 26.0 - 34.0 pg   MCHC 33.1 30.0 - 36.0 g/dL   RDW 19.1 (H) 11.5 - 15.5 %   Platelets 132 (L) 150 - 400 K/uL  Basic metabolic panel     Status: Abnormal   Collection Time: 08/13/14 12:16 PM  Result Value Ref Range   Sodium 133 (L) 135 - 145 mmol/L   Potassium 3.7 3.5 - 5.1 mmol/L   Chloride 99 (L) 101 - 111 mmol/L   CO2 25 22 - 32 mmol/L   Glucose, Bld 152 (H) 70 - 99 mg/dL   BUN 21 (H) 6 - 20 mg/dL   Creatinine, Ser 1.41 (H) 0.44 - 1.00 mg/dL   Calcium 9.5 8.9 - 10.3 mg/dL   GFR calc non Af Amer 34 (L) >60 mL/min   GFR calc Af Amer 39 (L) >60 mL/min    Comment: (NOTE) The eGFR has been calculated using the CKD EPI equation. This calculation has not been validated in all clinical situations. eGFR's persistently <60 mL/min signify possible Chronic Kidney Disease.    Anion gap 9 5 - 15  Hepatic function panel     Status: Abnormal   Collection Time: 08/13/14 12:16 PM  Result Value Ref Range   Total Protein 10.3 (H) 6.5 - 8.1 g/dL   Albumin 3.5 3.5 - 5.0 g/dL   AST 47 (H) 15 - 41 U/L  ALT 25 14 - 54 U/L   Alkaline  Phosphatase 69 38 - 126 U/L   Total Bilirubin 0.9 0.3 - 1.2 mg/dL   Bilirubin, Direct 0.2 0.1 - 0.5 mg/dL   Indirect Bilirubin 0.7 0.3 - 0.9 mg/dL  Lipase, blood     Status: Abnormal   Collection Time: 08/13/14 12:16 PM  Result Value Ref Range   Lipase 21 (L) 22 - 51 U/L  Brain natriuretic peptide     Status: Abnormal   Collection Time: 08/13/14 12:16 PM  Result Value Ref Range   B Natriuretic Peptide 150.0 (H) 0.0 - 100.0 pg/mL  I-stat troponin, ED     Status: None   Collection Time: 08/13/14 12:41 PM  Result Value Ref Range   Troponin i, poc 0.05 0.00 - 0.08 ng/mL   Comment 3            Comment: Due to the release kinetics of cTnI, a negative result within the first hours of the onset of symptoms does not rule out myocardial infarction with certainty. If myocardial infarction is still suspected, repeat the test at appropriate intervals.   I-Stat Chem 8, ED     Status: Abnormal   Collection Time: 08/13/14 12:43 PM  Result Value Ref Range   Sodium 140 135 - 145 mmol/L   Potassium 3.9 3.5 - 5.1 mmol/L   Chloride 99 (L) 101 - 111 mmol/L   BUN 25 (H) 6 - 20 mg/dL   Creatinine, Ser 1.40 (H) 0.44 - 1.00 mg/dL   Glucose, Bld 156 (H) 70 - 99 mg/dL   Calcium, Ion 1.14 1.13 - 1.30 mmol/L   TCO2 22 0 - 100 mmol/L   Hemoglobin 10.5 (L) 12.0 - 15.0 g/dL   HCT 31.0 (L) 36.0 - 46.0 %  I-Stat CG4 Lactic Acid, ED     Status: Abnormal   Collection Time: 08/13/14 12:44 PM  Result Value Ref Range   Lactic Acid, Venous 2.85 (HH) 0.5 - 2.0 mmol/L   Comment NOTIFIED PHYSICIAN   POC occult blood, ED Provider will collect     Status: None   Collection Time: 08/13/14 12:50 PM  Result Value Ref Range   Fecal Occult Bld NEGATIVE NEGATIVE  CBG, ED     Status: Abnormal   Collection Time: 08/13/14 12:55 PM  Result Value Ref Range   Glucose-Capillary 117 (H) 70 - 99 mg/dL  Type and screen     Status: None   Collection Time: 08/13/14 12:55 PM  Result Value Ref Range   ABO/RH(D) A NEG     Antibody Screen NEG    Sample Expiration 08/16/2014   Urinalysis, Routine w reflex microscopic     Status: Abnormal   Collection Time: 08/13/14  1:22 PM  Result Value Ref Range   Color, Urine YELLOW YELLOW   APPearance TURBID (A) CLEAR   Specific Gravity, Urine 1.021 1.005 - 1.030   pH 5.5 5.0 - 8.0   Glucose, UA NEGATIVE NEGATIVE mg/dL   Hgb urine dipstick LARGE (A) NEGATIVE   Bilirubin Urine SMALL (A) NEGATIVE   Ketones, ur 15 (A) NEGATIVE mg/dL   Protein, ur 100 (A) NEGATIVE mg/dL   Urobilinogen, UA 1.0 0.0 - 1.0 mg/dL   Nitrite NEGATIVE NEGATIVE   Leukocytes, UA LARGE (A) NEGATIVE  Urine microscopic-add on     Status: Abnormal   Collection Time: 08/13/14  1:22 PM  Result Value Ref Range   Squamous Epithelial / LPF FEW (A) RARE   WBC, UA TOO  NUMEROUS TO COUNT <3 WBC/hpf   RBC / HPF 7-10 <3 RBC/hpf   Bacteria, UA MANY (A) RARE    Radiology Reports: Ct Head Wo Contrast  08/13/2014   CLINICAL DATA:  Fall while standing, weakness  EXAM: CT HEAD WITHOUT CONTRAST  TECHNIQUE: Contiguous axial images were obtained from the base of the skull through the vertex without intravenous contrast.  COMPARISON:  None.  FINDINGS: Bony calvarium is intact. No gross soft tissue abnormality is noted. Paranasal sinuses and mastoid air cells are well aerated. No findings to suggest acute hemorrhage, acute infarction or space-occupying mass lesion are noted. Very mild areas of prior ischemic change are seen. No acute abnormality is noted.  IMPRESSION: Mild chronic white matter ischemic changes noted. No acute abnormality is noted.   Electronically Signed   By: Inez Catalina M.D.   On: 08/13/2014 14:59   Dg Chest Port 1 View  08/13/2014   CLINICAL DATA:  Per ED note: Per [facility], pt is from home, got up this morning to go to the bathroom and fell from a standing position Forward to her knees and hands due to weakness. Pt denies injury, no neck or back pain. Pt mildly febrile 100.3, and difficulty urinating  for several weeks. Pt also has hx of enemia. Pt has had two or three transfusions. Scheduled for one Friday and they called her to say her hemoglobin was up and she didn't need it. CBG 117. AAOX4, c/o pain below her belly button. 20 in R handH/O CABG X 2 in 2005, COPD, HTN, cardiac cath in 2015.  EXAM: PORTABLE CHEST - 1 VIEW  COMPARISON:  06/22/2014  FINDINGS: Cardiac silhouette is mildly enlarged. Changes from CABG surgery are stable. No mediastinal or hilar masses.  No lung consolidation or edema. No pleural effusion or pneumothorax.  Bony thorax is demineralized but grossly intact.  There are carotid calcifications in the neck.  IMPRESSION: No acute cardiopulmonary disease.   Electronically Signed   By: Lajean Manes M.D.   On: 08/13/2014 12:53    Electrocardiogram: Sinus rhythm at 75 bpm. Normal axis. Prolonged QT interval is noted. Q waves in inferior leads. Nonspecific ST changes. Similar to previous EKG.  Problem List  Principal Problem:   UTI (lower urinary tract infection) Active Problems:   Dyslipidemia   GERD (gastroesophageal reflux disease)   COPD (chronic obstructive pulmonary disease)   Hypertension   CAD (coronary artery disease) of artery bypass graft, atretic LIMA   Normocytic anemia   Malnutrition of moderate degree   Sepsis   Assessment: This is a 81 year old Caucasian female who comes in after a fall. She's had an episode of urinary incontinence and has had dysuria and frequent urination recently. Her UA is abnormal. She is febrile. All of this points towards a urinary tract infection. She has an elevated lactic acid level, suggesting sepsis.  Plan: #1 urinary tract infection with sepsis resulting in a fall: She'll be admitted to the hospital. Urine cultures are pending. She'll be given antibiotics. IV fluids have been given per sepsis protocol. Avoid overhydration. She did not have any injury secondary to the fall. PT and OT will be consulted. CT head was unremarkable.  Trend lactic acid levels. Monitor vital signs closely.  #2 Normocytic anemia: She is under evaluation at the cancer clinic for possibility of myeloma. She is supposed to have a bone marrow biopsy in the near future. He was transfused back in February. She underwent evaluation including EGD for nausea, which  showed a nonbleeding AVM. Hemoglobin is close to baseline. No overt blood loss has been noted. Suspect that it will go down with the hydration. Monitor closely and transfuse as needed.  #3 history of coronary artery disease: Stable. Denies any chest pain.  #4 history of emphysema: Stable. Continue with Spiriva. Oxygen as needed.  #5 chronic kidney disease, stage III: Creatinine is slightly higher than her baseline. Recheck after hydration. Continues to make urine.  #6 Essential hypertension: Blood pressure is somewhat borderline. Hold her antihypertensives for now.   DVT Prophylaxis: SCDs for now Code Status: Full code Family Communication: Discussed with the patient. Her daughter was not at bedside.  Disposition Plan: Admit to telemetry   Further management decisions will depend on results of further testing and patient's response to treatment.   [facility]  [facility] Pager [phone]  If 7PM-7AM, please contact night-coverage [email or web address] Password [facility]  08/13/2014, 3:49 PM

## 2014-08-13 NOTE — ED Provider Notes (Signed)
CSN: [id number]     Arrival date & time 08/13/14  1153 History   First MD Initiated Contact with Patient 08/13/14 1158     Chief Complaint  Patient presents with  . Fall  . Weakness  . Abdominal Pain     (Consider location/radiation/quality/duration/timing/severity/associated sxs/prior Treatment) HPI   [name] is a 81 y.o. female with past medical history significant for COPD (on nasal cannula at home PRN), CABG (ECHO 05/2013 EF 50-55%, CATH 02/2014 nonobstructive), recurrent pneumonia accompanied by daughter who is her caregiver complaining of fall  With no LOC this morning. Before the fall patient states that she felt lightheaded and nauseous. Patient is a late riser, when she woke up her typical time of 10:30 AM she got up to go to the bathroom, she felt weak. As per daughter, she was found in the bathroom with her pants halfway off, she had soiled her bed with urine and had stool in the pants in the bathroom. Patient did not realize she was incontinent. As per daughter, she was confused when she found her on the ground. States that this resolved within a matter of minutes. No history of prior seizure. Patient is not anticoagulated she denies head trauma, headache, change in vision, cervicalgia. On review of systems she states that she is more short of breath than normal, she denies change in her baseline, dry cough. She has a left-sided chest pain which she described as sharp and 4 out of 10 onset after the fall. She normally takes a daily aspirin but has not had it this morning. EMS on scene found her to be mildly febrile 100.3. Patient reports that she's had difficulty urinating over the last 2 weeks with a 4/10 bilateral lower quadrant pain.. As per daughter patient is being worked up for leukemia, she has a normocytic anemia of unknown origin, she is scheduled for transfusions regularly, they advised her she did not have to present to days ago for her scheduled transfusion because  hemoglobin level was adequate.  Past Medical History  Diagnosis Date  . COPD (chronic obstructive pulmonary disease)   . Gout   . Coronary atherosclerosis of native coronary artery 02/05/2012  . Hypertension   . High blood cholesterol   . Heart murmur     "all my life" (02/05/2012)  . Anginal pain   . Myocardial infarction 1990's; 2000's    "2 total, I think" (02/05/2012)  . Pneumonia     "several times" (02/05/2012)  . Chronic bronchitis   . Shortness of breath     "sometimes when I'm laying down; always when I do too much" (02/05/2012)  . History of blood transfusion   . External bleeding hemorrhoids     "act up at times" (02/05/2012)  . Chronic back pain     "all over" (02/05/2012)  . Depression   . CAD (coronary artery disease) of artery bypass graft 02/09/14    atretic LIMA   Past Surgical History  Procedure Laterality Date  . Appendectomy  ? 1970's  . Ectopic pregnancy surgery  1970's  . Abdominal hysterectomy  ? 1970's    "partial 1st time; complete 2nd" (02/05/2012)  . Cholecystectomy  ~ 2010  . Cataract extraction w/ intraocular lens  implant, bilateral  [phone]  . Coronary angioplasty with stent placement  2005  . Coronary artery bypass graft  2005    CABG X2  . Cardiac catheterization  02/09/14    atretic LIMA,  patent VG-dRCA and Total occlusion  of the native RCA proximally prior to remotely placement RCA stent, with mild proximal 30% LAD stenosis and 30% distal circumflex stenoses.  EF 55%.  . Left heart catheterization with coronary/graft angiogram N/A 02/09/2014    Procedure: LEFT HEART CATHETERIZATION WITH Beatrix Fetters;  Surgeon: Troy Sine, MD;  Location: [facility] CATH LAB;  Service: Cardiovascular;  Laterality: N/A;  . Esophagogastroduodenoscopy (egd) with propofol Left 05/22/2014    Procedure: ESOPHAGOGASTRODUODENOSCOPY (EGD) WITH PROPOFOL;  Surgeon: Arta Silence, MD;  Location: [facility] ENDOSCOPY;  Service: Endoscopy;  Laterality: Left;    Family History  Problem Relation Age of Onset  . Anemia Neg Hx   . Arrhythmia Neg Hx   . Asthma Neg Hx   . Clotting disorder Neg Hx   . Fainting Neg Hx   . Heart attack Neg Hx   . Heart disease Neg Hx   . Heart failure Neg Hx   . Hyperlipidemia Neg Hx   . Hypertension Neg Hx   . Aneurysm    . CVA    . CVA Mother   . Aneurysm Father    History  Substance Use Topics  . Smoking status: Former Smoker -- 1.00 packs/day for 50 years    Types: Cigarettes    Quit date: 04/02/2011  . Smokeless tobacco: Never Used  . Alcohol Use: Yes     Comment: 02/05/2012 "have a drink of wine ~ q 6 months"   OB History    No data available     Review of Systems  10 systems reviewed and found to be negative, except as noted in the HPI.   Allergies  Lexapro; Soma; Wellbutrin; Albuterol sulfate; Codeine; Levaquin; and Plavix  Home Medications   Prior to Admission medications   Medication Sig Start Date End Date Taking? Authorizing Provider  albuterol-ipratropium (COMBIVENT) 18-103 MCG/ACT inhaler Inhale 1-2 puffs into the lungs every 4 (four) hours as needed for wheezing or shortness of breath.    Historical Provider, MD  aspirin 325 MG EC tablet Take 325 mg by mouth daily.    Historical Provider, MD  cholecalciferol (VITAMIN D) 1000 UNITS tablet Take 1,000 Units by mouth daily.    Historical Provider, MD  clonazePAM (KLONOPIN) 1 MG tablet Take 1 mg by mouth at bedtime. 08/02/14   Historical Provider, MD  ezetimibe (ZETIA) 10 MG tablet Take 1 tablet (10 mg total) by mouth at bedtime. 05/25/14   Hosie Poisson, MD  hydrochlorothiazide (HYDRODIURIL) 25 MG tablet Take 25 mg by mouth daily. 05/16/14   Historical Provider, MD  nitroGLYCERIN (NITROSTAT) 0.4 MG SL tablet Place 0.4 mg under the tongue every 5 (five) minutes as needed for chest pain.    Historical Provider, MD  Omega-3 Fatty Acids (FISH OIL) 1200 MG CAPS Take 1,200-2,400 mg by mouth 2 (two) times daily. Take 2 capsules (2400 mg) every  morning and 1 capsule (1200 mg) every night    Historical Provider, MD  pantoprazole (PROTONIX) 40 MG tablet Take 1 tablet (40 mg total) by mouth 2 (two) times daily. 05/25/14   Hosie Poisson, MD  polyethylene glycol powder (GLYCOLAX/MIRALAX) powder Take 2 Containers by mouth at bedtime. Mix with 8 oz liquid and drink 05/16/14   Historical Provider, MD  prochlorperazine (COMPAZINE) 10 MG tablet Take 10 mg by mouth 3 (three) times daily. As needed for nausea/vomiting 06/29/14   Historical Provider, MD  RA SENNA 8.6 MG tablet Take 1 tablet by mouth daily as needed for constipation.  02/07/14   Historical Provider, MD  tiotropium (SPIRIVA) 18 MCG inhalation capsule Place 18 mcg into inhaler and inhale daily as needed (shortness of breath).     Historical Provider, MD  traMADol (ULTRAM) 50 MG tablet Take 2 tablets (100 mg total) by mouth every 6 (six) hours as needed (pain). For pain 05/25/14   Hosie Poisson, MD   BP 134/68 mmHg  Pulse 59  Temp(Src) 101.1 F (38.4 C) (Rectal)  Resp 19  Ht 5\' 2"  (1.575 m)  Wt 132 lb (59.875 kg)  BMI 24.14 kg/m2  SpO2 98% Physical Exam  Constitutional: She is oriented to person, place, and time. She appears well-developed and well-nourished. No distress.  HENT:  Head: Normocephalic and atraumatic.  Positive conjunctival pallor, mucous membranes are moderately dry.  No objective signs of trauma, no intraoral trauma.  Eyes: Conjunctivae and EOM are normal. Pupils are equal, round, and reactive to light.  Neck: Normal range of motion. Neck supple. No JVD present.  No midline C-spine  tenderness to palpation or step-offs appreciated. Patient has full range of motion without pain.   Cardiovascular: Normal rate, regular rhythm and intact distal pulses.   Pulmonary/Chest: Effort normal and breath sounds normal. No respiratory distress. She has no wheezes. She has no rales. She exhibits no tenderness.  Remote midline sternotomy scar  Abdominal: Soft. Bowel sounds are  normal. She exhibits no distension and no mass. There is no tenderness. There is no rebound and no guarding.  Genitourinary:  Normal stool color  Musculoskeletal: Normal range of motion. She exhibits no edema or tenderness.  Neurological: She is alert and oriented to person, place, and time. No cranial nerve deficit.  III/IV/VI-Extraocular movements intact.  Pupils reactive bilaterally. (Left prior cataract surgery) V/VII-Smile symmetric, equal eyebrow raise,  facial sensation intact VIII- Hearing grossly intact IX/X-Normal gag XI-bilateral shoulder shrug XII-midline tongue extension Motor: 5/5 bilaterally with normal tone and bulk No pronator drift.   Gait is not evaluated.   Skin: She is not diaphoretic.  Psychiatric: She has a normal mood and affect.  Nursing note and vitals reviewed.   ED Course  Procedures (including critical care time)  CRITICAL CARE Performed by: Monico Blitz  Total critical care time: 35  Critical care time was exclusive of separately billable procedures and treating other patients.  Critical care was necessary to treat or prevent imminent or life-threatening deterioration.  Critical care was time spent personally by me on the following activities: development of treatment plan with patient and/or surrogate as well as nursing, discussions with consultants, evaluation of patient's response to treatment, examination of patient, obtaining history from patient or surrogate, ordering and performing treatments and interventions, ordering and review of laboratory studies, ordering and review of radiographic studies, pulse oximetry and re-evaluation of patient's condition.  Labs Review Labs Reviewed  CBC - Abnormal; Notable for the following:    RBC 2.63 (*)    Hemoglobin 8.6 (*)    HCT 26.0 (*)    RDW 19.1 (*)    Platelets 132 (*)    All other components within normal limits  BASIC METABOLIC PANEL - Abnormal; Notable for the following:    Sodium 133  (*)    Chloride 99 (*)    Glucose, Bld 152 (*)    BUN 21 (*)    Creatinine, Ser 1.41 (*)    GFR calc non Af Amer 34 (*)    GFR calc Af Amer 39 (*)    All other components within normal limits  HEPATIC FUNCTION PANEL - Abnormal;  Notable for the following:    Total Protein 10.3 (*)    AST 47 (*)    All other components within normal limits  LIPASE, BLOOD - Abnormal; Notable for the following:    Lipase 21 (*)    All other components within normal limits  URINALYSIS, ROUTINE W REFLEX MICROSCOPIC - Abnormal; Notable for the following:    APPearance TURBID (*)    Hgb urine dipstick LARGE (*)    Bilirubin Urine SMALL (*)    Ketones, ur 15 (*)    Protein, ur 100 (*)    Leukocytes, UA LARGE (*)    All other components within normal limits  BRAIN NATRIURETIC PEPTIDE - Abnormal; Notable for the following:    B Natriuretic Peptide 150.0 (*)    All other components within normal limits  URINE MICROSCOPIC-ADD ON - Abnormal; Notable for the following:    Squamous Epithelial / LPF FEW (*)    Bacteria, UA MANY (*)    All other components within normal limits  CBG MONITORING, ED - Abnormal; Notable for the following:    Glucose-Capillary 117 (*)    All other components within normal limits  I-STAT CG4 LACTIC ACID, ED - Abnormal; Notable for the following:    Lactic Acid, Venous 2.85 (*)    All other components within normal limits  I-STAT CHEM 8, ED - Abnormal; Notable for the following:    Chloride 99 (*)    BUN 25 (*)    Creatinine, Ser 1.40 (*)    Glucose, Bld 156 (*)    Hemoglobin 10.5 (*)    HCT 31.0 (*)    All other components within normal limits  CULTURE, BLOOD (ROUTINE X 2)  CULTURE, BLOOD (ROUTINE X 2)  URINE CULTURE  I-STAT TROPOININ, ED  POC OCCULT BLOOD, ED  I-STAT CG4 LACTIC ACID, ED  TYPE AND SCREEN    Imaging Review Ct Head Wo Contrast  08/13/2014   CLINICAL DATA:  Fall while standing, weakness  EXAM: CT HEAD WITHOUT CONTRAST  TECHNIQUE: Contiguous axial images  were obtained from the base of the skull through the vertex without intravenous contrast.  COMPARISON:  None.  FINDINGS: Bony calvarium is intact. No gross soft tissue abnormality is noted. Paranasal sinuses and mastoid air cells are well aerated. No findings to suggest acute hemorrhage, acute infarction or space-occupying mass lesion are noted. Very mild areas of prior ischemic change are seen. No acute abnormality is noted.  IMPRESSION: Mild chronic white matter ischemic changes noted. No acute abnormality is noted.   Electronically Signed   By: Inez Catalina M.D.   On: 08/13/2014 14:59   Dg Chest Port 1 View  08/13/2014   CLINICAL DATA:  Per ED note: Per [facility], pt is from home, got up this morning to go to the bathroom and fell from a standing position Forward to her knees and hands due to weakness. Pt denies injury, no neck or back pain. Pt mildly febrile 100.3, and difficulty urinating for several weeks. Pt also has hx of enemia. Pt has had two or three transfusions. Scheduled for one Friday and they called her to say her hemoglobin was up and she didn't need it. CBG 117. AAOX4, c/o pain below her belly button. 54 in R handH/O CABG X 2 in 2005, COPD, HTN, cardiac cath in 2015.  EXAM: PORTABLE CHEST - 1 VIEW  COMPARISON:  06/22/2014  FINDINGS: Cardiac silhouette is mildly enlarged. Changes from CABG surgery are stable. No mediastinal or  hilar masses.  No lung consolidation or edema. No pleural effusion or pneumothorax.  Bony thorax is demineralized but grossly intact.  There are carotid calcifications in the neck.  IMPRESSION: No acute cardiopulmonary disease.   Electronically Signed   By: Lajean Manes M.D.   On: 08/13/2014 12:53     EKG Interpretation None       MDM   Final diagnoses:  Weakness  Sepsis, due to unspecified organism  UTI (lower urinary tract infection)  Fall at home, initial encounter  Acute on chronic renal insufficiency  Hyponatremia    Filed Vitals:   08/13/14  1244 08/13/14 1334 08/13/14 1400 08/13/14 1430  BP: 118/41 115/37 121/40 134/68  Pulse: 76 64 58 59  Temp:      TempSrc:      Resp: 18 18 16 19   Height:      Weight:      SpO2: 98% 97% 97% 98%    Medications  sodium chloride 0.9 % bolus 1,000 mL (1,000 mLs Intravenous New Bag/Given 08/13/14 1522)  sodium chloride 0.9 % bolus 500 mL (0 mLs Intravenous Stopped 08/13/14 1412)  aspirin chewable tablet 324 mg (324 mg Oral Given 08/13/14 1245)  acetaminophen (TYLENOL) tablet 650 mg (650 mg Oral Given 08/13/14 1245)  sodium chloride 0.9 % bolus 1,000 mL (0 mLs Intravenous Stopped 08/13/14 1508)  cefTRIAXone (ROCEPHIN) 2 g in dextrose 5 % 50 mL IVPB (2 g Intravenous New Bag/Given 08/13/14 1508)    Idalia Needle Simonetti is a pleasant 81 y.o. female presenting with fall at home, there does not appear to be any head trauma or loss of consciousness. No objective signs of trauma. Neuro exam is nonfocal. Alert and oriented 3. No meningeal signs. Patient's daughter states she was slightly confused when she found her this resolved quickly. No history of seizure. She is found to be febrile with a rectal temperature of 101.1. Her lactic acid is elevated at 2.85. Chest x-ray is without infiltrate. Pro BNP is not elevated, troponin is negative, EKG is nonischemic.  Patient appears clinically dehydrated. She has a normal EF. Will start fluid boluses.  Patient is anemic at 8.6/26. I will hold off on transfusing at this time. Her creatinine is 1.41 today this is increased from her baseline of 1.2. Her BUN is elevated however think is probably secondary to dehydration. She reports that she has a melanotic stool however her stool is normal in color and her guaiac is negative.  Urinalysis is consistent with infection, her sepsis is probably urosepsis. Unfortunately, we have no prior urine cultures. Patient will be given 2 g of Rocephin and cultures are pending.  Head CT is negative.  This is a shared visit with the attending  physician who personally evaluated the patient and agrees with the care plan.   Case discussed with triad hospitalist Fredia Beets who accepts admission to a telemetry bed.   Monico Blitz, PA-C 08/13/14 [place], PA-C 08/13/14 1550

## 2014-08-13 NOTE — ED Notes (Signed)
Pt denies LOC, denies hitting head, EKG unremarkable.

## 2014-08-13 NOTE — ED Notes (Signed)
Per [facility], pt is from home, got up this morning to go to the bathroom and fell from a standing position  Forward to her knees and hands due to weakness. Pt denies injury, no neck or back pain. Pt mildly febrile 100.3, and difficulty urinating for several weeks. Pt also has hx of enemia. Pt has had two or three transfusions. Scheduled for one Friday and they called her to say her hemoglobin was up and she didn't need it. CBG 117. AAOX4, c/o pain below her belly button. 20 in R hand

## 2014-08-14 DIAGNOSIS — E785 Hyperlipidemia, unspecified: Secondary | ICD-10-CM

## 2014-08-14 LAB — HEMOGLOBIN AND HEMATOCRIT, BLOOD
HEMATOCRIT: 25.6 % — AB (ref 36.0–46.0)
Hemoglobin: 8.5 g/dL — ABNORMAL LOW (ref 12.0–15.0)

## 2014-08-14 LAB — RETICULOCYTES
RBC.: 2.15 MIL/uL — AB (ref 3.87–5.11)
RETIC COUNT ABSOLUTE: 58.1 10*3/uL (ref 19.0–186.0)
RETIC CT PCT: 2.7 % (ref 0.4–3.1)

## 2014-08-14 LAB — COMPREHENSIVE METABOLIC PANEL
ALBUMIN: 2.5 g/dL — AB (ref 3.5–5.0)
ALT: 17 U/L (ref 14–54)
AST: 25 U/L (ref 15–41)
Alkaline Phosphatase: 50 U/L (ref 38–126)
Anion gap: 5 (ref 5–15)
BUN: 23 mg/dL — ABNORMAL HIGH (ref 6–20)
CALCIUM: 8.2 mg/dL — AB (ref 8.9–10.3)
CO2: 25 mmol/L (ref 22–32)
CREATININE: 1.1 mg/dL — AB (ref 0.44–1.00)
Chloride: 105 mmol/L (ref 101–111)
GFR calc Af Amer: 53 mL/min — ABNORMAL LOW (ref >60)
GFR, EST NON AFRICAN AMERICAN: 46 mL/min — AB (ref >60)
Glucose, Bld: 98 mg/dL (ref 70–99)
Potassium: 3.6 mmol/L (ref 3.5–5.1)
Sodium: 135 mmol/L (ref 135–145)
TOTAL PROTEIN: 7.5 g/dL (ref 6.5–8.1)
Total Bilirubin: 0.6 mg/dL (ref 0.3–1.2)

## 2014-08-14 LAB — CBC
HCT: 21 % — ABNORMAL LOW (ref 36.0–46.0)
Hemoglobin: 6.8 g/dL — CL (ref 12.0–15.0)
MCH: 32.4 pg (ref 26.0–34.0)
MCHC: 32.4 g/dL (ref 30.0–36.0)
MCV: 100 fL (ref 78.0–100.0)
Platelets: 106 10*3/uL — ABNORMAL LOW (ref 150–400)
RBC: 2.1 MIL/uL — AB (ref 3.87–5.11)
RDW: 19.5 % — ABNORMAL HIGH (ref 11.5–15.5)
WBC: 6.6 10*3/uL (ref 4.0–10.5)

## 2014-08-14 LAB — IRON AND TIBC
IRON: 47 ug/dL (ref 28–170)
Saturation Ratios: 23 % (ref 10.4–31.8)
TIBC: 209 ug/dL — ABNORMAL LOW (ref 250–450)
UIBC: 162 ug/dL

## 2014-08-14 LAB — FOLATE: FOLATE: 7.9 ng/mL (ref >5.9)

## 2014-08-14 LAB — VITAMIN B12: Vitamin B-12: 169 pg/mL — ABNORMAL LOW (ref 180–914)

## 2014-08-14 LAB — PREPARE RBC (CROSSMATCH)

## 2014-08-14 LAB — FERRITIN: Ferritin: 407 ng/mL — ABNORMAL HIGH (ref 11–307)

## 2014-08-14 MED ORDER — ENOXAPARIN SODIUM 40 MG/0.4ML ~~LOC~~ SOLN
40.0000 mg | SUBCUTANEOUS | Status: DC
  Administered 2014-08-14 – 2014-08-15 (×2): 40 mg via SUBCUTANEOUS
  Filled 2014-08-14 (×2): qty 0.4

## 2014-08-14 MED ORDER — DIPHENHYDRAMINE HCL 50 MG/ML IJ SOLN
25.0000 mg | Freq: Once | INTRAMUSCULAR | Status: AC
Start: 2014-08-14 — End: 2014-08-14
  Administered 2014-08-14: 25 mg via INTRAVENOUS
  Filled 2014-08-14: qty 1

## 2014-08-14 MED ORDER — ALBUTEROL SULFATE (2.5 MG/3ML) 0.083% IN NEBU
2.5000 mg | INHALATION_SOLUTION | RESPIRATORY_TRACT | Status: DC | PRN
Start: 2014-08-14 — End: 2014-08-17

## 2014-08-14 MED ORDER — FUROSEMIDE 10 MG/ML IJ SOLN
20.0000 mg | Freq: Once | INTRAMUSCULAR | Status: AC
  Administered 2014-08-14: 20 mg via INTRAVENOUS
  Filled 2014-08-14: qty 2

## 2014-08-14 MED ORDER — SODIUM CHLORIDE 0.9 % IV SOLN
Freq: Once | INTRAVENOUS | Status: AC
  Administered 2014-08-14: 09:00:00 via INTRAVENOUS

## 2014-08-14 MED ORDER — ENSURE ENLIVE PO LIQD
237.0000 mL | Freq: Two times a day (BID) | ORAL | Status: DC
  Administered 2014-08-14 – 2014-08-17 (×5): 237 mL via ORAL

## 2014-08-14 MED ORDER — ACETAMINOPHEN 325 MG PO TABS
650.0000 mg | ORAL_TABLET | Freq: Once | ORAL | Status: AC
  Administered 2014-08-14: 650 mg via ORAL
  Filled 2014-08-14: qty 2

## 2014-08-14 NOTE — Evaluation (Addendum)
Occupational Therapy Evaluation Patient Details Name: [name] MRN: [id number] DOB: 02-10-1932 Today's Date: 08/14/2014    History of Present Illness 81 y.o. female with a past medical history of coronary artery disease, emphysema, and chronic kidney disease stage III, hypertension, chronic anemia. She became dizzy at home and fell.   Clinical Impression   Pt admitted with above. Pt independent with ADLs, PTA. Feel pt will benefit from acute OT to increase independence and activity tolerance. Pt reports her balance feels off from baseline.    Follow Up Recommendations  Home health OT;Supervision - Intermittent    Equipment Recommendations  3 in 1 bedside comode    Recommendations for Other Services       Precautions / Restrictions Precautions Precautions: Fall Restrictions Weight Bearing Restrictions: No      Mobility Bed Mobility Overal bed mobility: Needs Assistance Bed Mobility: Supine to Sit;Sit to Supine     Supine to sit: Supervision Sit to supine: Supervision   General bed mobility comments: supervision for lines  Transfers Overall transfer level: Needs assistance   Transfers: Sit to/from Stand Sit to Stand: Min guard;Supervision              Balance Overall balance assessment: History of FallsMin guard for ambulation.                                          ADL Overall ADL's : Needs assistance/impaired     Grooming: Wash/dry hands;Brushing hair;Set up;Supervision/safety;Standing               Lower Body Dressing: Min guard;Sit to/from stand   Toilet Transfer: Min guard;Ambulation (bed)           Functional mobility during ADLs: Min guard General ADL Comments: Pt able to don/doff socks. Pt ambulated to sink. Reported dizziness in session but did resolve. Pt seemed SOB at end of session.     Vision     Perception     Praxis      Pertinent Vitals/Pain Pain Assessment: No/denies pain; Pt on 1.5L of  O2 in session and O2 at 98% at end of session.     Hand Dominance     Extremity/Trunk Assessment Upper Extremity Assessment Upper Extremity Assessment: Generalized weakness   Lower Extremity Assessment Lower Extremity Assessment: Defer to PT evaluation       Communication Communication Communication: No difficulties   Cognition Arousal/Alertness: Awake/alert Behavior During Therapy: WFL for tasks assessed/performed Overall Cognitive Status: Within Functional Limits for tasks assessed                     General Comments       Exercises       Shoulder Instructions      Home Living Family/patient expects to be discharged to:: Private residence Living Arrangements: Children Available Help at Discharge: Family;Available PRN/intermittently Type of Home: Mobile home Home Access: Stairs to enter Entrance Stairs-Number of Steps: 4 Entrance Stairs-Rails: Right;Left Home Layout: One level     Bathroom Shower/Tub: Teacher, early years/pre: Standard (sink close)     Home Equipment: Environmental consultant - 2 wheels;Tub bench          Prior Functioning/Environment Level of Independence: Independent             OT Diagnosis: Generalized weakness   OT Problem List: Decreased strength;Decreased activity tolerance;Impaired balance (sitting  and/or standing);Decreased knowledge of use of DME or AE;Decreased knowledge of precautions   OT Treatment/Interventions: Self-care/ADL training;Therapeutic exercise;DME and/or AE instruction;Therapeutic activities;Patient/family education;Balance training;Energy conservation    OT Goals(Current goals can be found in the care plan section) Acute Rehab OT Goals Patient Stated Goal: not stated OT Goal Formulation: With patient Time For Goal Achievement: 08/21/14 Potential to Achieve Goals: Good ADL Goals Pt Will Perform Lower Body Bathing: with modified independence;sit to/from stand Pt Will Perform Lower Body Dressing: with  modified independence;sit to/from stand Pt Will Transfer to Toilet: with modified independence;ambulating;regular height toilet;grab bars Additional ADL Goal #1: Pt will independently perform bilateral UE exercises to increase strength.  OT Frequency: Min 2X/week   Barriers to D/C:            Co-evaluation              End of Session Equipment Utilized During Treatment: Gait belt;Oxygen Nurse Communication: Mobility status  Activity Tolerance: Patient tolerated treatment well Patient left: in bed;with call bell/phone within reach;with bed alarm set   Time: 7628-3151 OT Time Calculation (min): 12 min Charges:  OT General Charges $OT Visit: 1 Procedure OT Evaluation $Initial OT Evaluation Tier I: 1 Procedure G-CodesBenito Mccreedy OTR/L [phone] 08/14/2014, 11:37 AM

## 2014-08-14 NOTE — Evaluation (Signed)
Physical Therapy Evaluation Patient Details Name: [name] MRN: [id number] DOB: October 05, 1931 Today's Date: 08/14/2014   History of Present Illness  81 y.o. female with a past medical history of coronary artery disease, emphysema, and chronic kidney disease stage III, hypertension, chronic anemia. She became dizzy at home and fell.  Clinical Impression  Pt admitted with above diagnosis and presents to PT with functional limitations due to deficits listed below (See PT problem list). Pt needs skilled PT to maximize independence and safety to allow discharge to home. Doubt will need any PT post dc.     Follow Up Recommendations No PT follow up    Equipment Recommendations  None recommended by PT    Recommendations for Other Services       Precautions / Restrictions Precautions Precautions: Fall Restrictions Weight Bearing Restrictions: No      Mobility  Bed Mobility Overal bed mobility: Modified Independent Bed Mobility: Supine to Sit;Sit to Supine     Supine to sit: Supervision;Modified independent (Device/Increase time) Sit to supine: Modified independent (Device/Increase time)   General bed mobility comments: supervision for lines  Transfers Overall transfer level: Modified independent Equipment used: None Transfers: Sit to/from [facility] Sit to Stand: Modified independent (Device/Increase time) Stand pivot transfers: Modified independent (Device/Increase time)          Ambulation/Gait Ambulation/Gait assistance: Min guard Ambulation Distance (Feet): 250 Feet Assistive device:  (IV pole) Gait Pattern/deviations: Step-through pattern;Decreased stride length   Gait velocity interpretation: at or above normal speed for age/gender General Gait Details: Slightly unsteady but no LOB  Stairs            Wheelchair Mobility    Modified Rankin (Stroke Patients Only)       Balance Overall balance assessment: Needs assistance    Sitting balance-Leahy Scale: Normal     Standing balance support: No upper extremity supported;During functional activity Standing balance-Leahy Scale: Good                               Pertinent Vitals/Pain Pain Assessment: No/denies pain    Home Living Family/patient expects to be discharged to:: Private residence Living Arrangements: Children Available Help at Discharge: Family;Available PRN/intermittently Type of Home: Mobile home Home Access: Stairs to enter Entrance Stairs-Rails: Right;Left Entrance Stairs-Number of Steps: 4 Home Layout: One level Home Equipment: Walker - 2 wheels;Tub bench      Prior Function Level of Independence: Independent               Hand Dominance        Extremity/Trunk Assessment   Upper Extremity Assessment: Defer to OT evaluation           Lower Extremity Assessment: Generalized weakness         Communication   Communication: No difficulties  Cognition Arousal/Alertness: Awake/alert Behavior During Therapy: WFL for tasks assessed/performed Overall Cognitive Status: Within Functional Limits for tasks assessed                      General Comments      Exercises        Assessment/Plan    PT Assessment Patient needs continued PT services  PT Diagnosis Difficulty walking;Generalized weakness   PT Problem List Decreased strength;Decreased balance;Decreased mobility  PT Treatment Interventions Gait training;Functional mobility training;Therapeutic activities;Therapeutic exercise;Balance training;Patient/family education   PT Goals (Current goals can be found in the Care Plan section) Acute  Rehab PT Goals Patient Stated Goal: not stated PT Goal Formulation: With patient Time For Goal Achievement: 08/21/14 Potential to Achieve Goals: Good    Frequency Min 3X/week   Barriers to discharge        Co-evaluation               End of Session   Activity Tolerance: Patient tolerated  treatment well Patient left: in bed;with call bell/phone within [name];with bed alarm set Nurse Communication: Mobility status         Time: 2505-3976 PT Time Calculation (min) (ACUTE ONLY): 15 min   Charges:   PT Evaluation $Initial PT Evaluation Tier I: 1 Procedure     PT G Codes:        [name] 08-31-14, 12:54 PM  [facility] PT [phone]

## 2014-08-14 NOTE — Progress Notes (Signed)
Patient Hgb 6.8, patient asymptomatic with no complaints, VSS, tele maintained showing SB HR 50's, Dr. Hilbert Bible and Dr. Sloan Leiter notified, awaiting response at this time, will report to oncoming shift and continue to monitor closely.

## 2014-08-14 NOTE — Progress Notes (Addendum)
CRITICAL VALUE ALERT  Critical value received:  Hgb 6.8  Date of notification:  08/14/14  Time of notification:  0657  Critical value read back: Yes  Nurse who received alert:  Everardo All RN  MD notified (1st page):  Dr. Hilbert Bible  Time of first page:  [phone]  MD notified (2nd page): Dr. Sloan Leiter  Time of second page: [phone]  Responding MD:  Awaiting Response  Time MD responded:  Awaiting Response

## 2014-08-14 NOTE — Progress Notes (Signed)
Patient with c/o SOB and DOE when out of bed to commode, placed on 2L oxygen for comfort with sat 95%, LS clear to top with crackles noted to bases, patient also with new congested non-productive cough,  Dr. Baltazar Najjar notified and aware, 20 mg IV lasix ordered and administered, VSS, patient with no c/o CP, will continue to monitor closely and attempt to wean oxygen this shift.

## 2014-08-14 NOTE — Progress Notes (Signed)
Dr. Sloan Leiter aware of Hgb, orders placed, will report to oncoming shift

## 2014-08-14 NOTE — Progress Notes (Addendum)
Initial Nutrition Assessment  DOCUMENTATION CODES:  Severe malnutrition in context of acute illness/injury  INTERVENTION:  Ensure Enlive (each supplement provides 350kcal and 20 grams of protein)  NUTRITION DIAGNOSIS:  Malnutrition related to acute illness as evidenced by moderate depletions of muscle mass, moderate depletion of body fat, percent weight loss (10.2% wt loss x 6 months).   GOAL:  Patient will meet greater than or equal to 90% of their needs   MONITOR:  PO intake, Supplement acceptance, Labs, Weight trends, Skin, I & O's  REASON FOR ASSESSMENT:  Consult Assessment of nutrition requirement/status  ASSESSMENT: [name] is a 81 y.o. female with a past medical history of coronary artery disease, emphysema, chronic kidney disease stage III, hypertension, chronic anemia who lives at home with her daughter. She was in her usual state of health until earlier today when she was going to the bathroom, felt dizzy and fell. She denies any injuries. She did not pass out. She had an episode of urinary incontinence at that time. She's had a fever. She denies any nausea, vomiting. She's had a few loose stools. No blood in the stool. She does admit to burning sensation with urination and frequent urination of small quantity. She's also felt a little short of breath at times. She denies any history of frequent falls. She usually independent with daily activities.  Pt admitted with syncope.  She reports that her appetite is very good at baseline. She reveals that she ate almost of her lunch today. She eats a regular diet at home. She reports that she had difficulty eating raw vegetables due to not having her dentures with her, but refused offer of diet downgrade.  Pt reports she has lost 20-30# within the last 6 months. While she denies changes in her appetite, she reveals that she has been hospitalized three times in the past 6 months and attributes this to the weight loss. Per  wt hx, UBW around 150#. Pt has experienced a 10.2% wt loss in the past 6 months. She also adds that she is very independent at baseline and she has been more dependent on her daughter for ADLS recently.  Pt reports she drinks 6-7 bottles of Ensure weekly. She would like to continue this supplement while in the hospital. Discussed importance of good meal and supplement intake to promote healing.  Reviewed menu choices with pt. She denied any further nutritional needs, but expressed appreciation for visit.  Labs reviewed. BUN/Creat: 23/1.10, Calcium: 8.2.   Height:  Ht Readings from Last 1 Encounters:  08/13/14 5\' 2"  (1.575 m)    Weight:  Wt Readings from Last 1 Encounters:  08/13/14 132 lb (59.875 kg)    Ideal Body Weight:  50 kg  Wt Readings from Last 10 Encounters:  08/13/14 132 lb (59.875 kg)  08/09/14 128 lb 8 oz (58.287 kg)  07/20/14 132 lb 1.6 oz (59.92 kg)  06/02/14 139 lb (63.05 kg)  05/25/14 136 lb (61.689 kg)  04/18/14 148 lb (67.132 kg)  03/09/14 151 lb (68.493 kg)  02/11/14 147 lb 4.8 oz (66.815 kg)  01/26/13 138 lb (62.596 kg)  02/06/12 146 lb 6.2 oz (66.4 kg)    BMI:  Body mass index is 24.14 kg/(m^2). Normal weight range  Estimated Nutritional Needs:  Kcal:  1600-1800  Protein:  75-85 grams  Fluid:  1.6-1.8 L  Skin:  Reviewed, no issues  Diet Order:  Diet Heart Room service appropriate?: Yes; Fluid consistency:: Thin  EDUCATION NEEDS:  Education needs  addressed   Intake/Output Summary (Last 24 hours) at 08/14/14 1532 Last data filed at 08/14/14 1432  Gross per 24 hour  Intake    815 ml  Output   1250 ml  Net   -435 ml    Last BM:  08/14/14  Clemence Stillings A. Jimmye Norman, RD, LDN, CDE Pager: [phone] After hours Pager: [phone]

## 2014-08-14 NOTE — Progress Notes (Signed)
PATIENT DETAILS Name: [name] Age: 81 y.o. Sex: female Date of Birth: 01/04/1932 Admit Date: 08/13/2014 Admitting Physician Bonnielee Haff, MD HUD:JSHFWY,OVZCHY, MD  Subjective: Feels better-afebrile last night.  Assessment/Plan: Principal Problem:   UTI (lower urinary tract infection) with sepsis: Improving, continue with IV Rocephin. Remains afebrile. Sepsis physiology has resolved. Await urine culture, blood culture negative so far.  Active Problems:   Mechanical fall: Suspect from weakness-due to above. CT head negative for acute abnormalities. Physical therapy eval. Nonfocal exam. Orthostatics negative on admission.    Anemia: Suspect worsening anemia from IV fluid and acute illness. No evidence of blood loss. Has history of chronic anemia-being worked up as an outpatient for possible multiple myeloma-bone marrow biopsy being contemplated in the near future. Since hemoglobin down to 6.8, will transfuse 1 unit of PRBC-obtain anemia panel prior to transfusion. Will defer further evaluation to the outpatient setting when she has recovered from acute illness.    Dyslipidemia: Continue Zetia    GERD (gastroesophageal reflux disease): Continue PPI    COPD (chronic obstructive pulmonary disease): Lungs clear-appears stable-continue Spiriva, continue as needed bronchodilators    Hypertension: BP moderately stable-monitor off antihypertensives for now-resume HCTZ over the next few days    Mild thrombocytopenia: Follow-will start on prophylactic Lovenox.    CAD (coronary artery disease) of artery bypass graft, atretic LIMA: Without any chest pain-continue aspirin    Malnutrition of moderate degree: Nutrition evaluation  Disposition: Remain inpatient-home or SNF in the next 1-2 days. Await physical therapy evaluation  Antimicrobial agents  See below  Anti-infectives    Start     Dose/Rate Route Frequency Ordered Stop   08/14/14 1000  cefTRIAXone  (ROCEPHIN) 1 g in dextrose 5 % 50 mL IVPB - Premix     1 g 100 mL/hr over 30 Minutes Intravenous Every 24 hours 08/13/14 1615     08/13/14 1445  cefTRIAXone (ROCEPHIN) 2 g in dextrose 5 % 50 mL IVPB     2 g 100 mL/hr over 30 Minutes Intravenous  Once 08/13/14 1438 08/13/14 1538      DVT Prophylaxis: Prophylactic Lovenox -watch platelets  Code Status: Full code   Family Communication None at bedside-patient awake and alert  Procedures: None  CONSULTS:  None  Time spent 35 minutes-Greater than 50% of this time was spent in counseling, explanation of diagnosis, planning of further management, and coordination of care.  MEDICATIONS: Scheduled Meds: . aspirin  325 mg Oral Daily  . cefTRIAXone (ROCEPHIN)  IV  1 g Intravenous Q24H  . clonazePAM  0.25 mg Oral QHS  . ezetimibe  10 mg Oral QHS  . furosemide  20 mg Intravenous Once  . pantoprazole  40 mg Oral BID  . polyethylene glycol  17 g Oral QHS  . sodium chloride  3 mL Intravenous Q12H   Continuous Infusions:  PRN Meds:.acetaminophen **OR** acetaminophen, ondansetron **OR** ondansetron (ZOFRAN) IV, oxyCODONE, prochlorperazine, tiotropium    PHYSICAL EXAM: Vital signs in last 24 hours: Filed Vitals:   08/14/14 0514 08/14/14 0606 08/14/14 0832 08/14/14 0903  BP: 147/36  159/57 146/66  Pulse: 50  63 66  Temp: 98 F (36.7 C)  97.7 F (36.5 C) 97.9 F (36.6 C)  TempSrc:   Oral Oral  Resp: $Remo'18  18 16  'kZvXe$ Height:      Weight:      SpO2: 92% 94% 93% 94%    Weight change:  Filed Weights   08/13/14 1203  Weight: 59.875 kg (132 lb)   Body mass index is 24.14 kg/(m^2).   Gen Exam: Awake and alert with clear speech.   Neck: Supple, No JVD.   Chest: B/L Clear.   CVS: S1 S2 Regular Abdomen: soft, BS +, non tender, non distended.  Extremities: no edema, lower extremities warm to touch. Neurologic: Non Focal.   Skin: No Rash.   Wounds: N/A.    Intake/Output from previous day:  Intake/Output Summary (Last 24 hours)  at 08/14/14 1059 Last data filed at 08/14/14 0908  Gross per 24 hour  Intake    230 ml  Output    750 ml  Net   -520 ml     LAB RESULTS: CBC  Recent Labs Lab 08/09/14 1354 08/13/14 1216 08/13/14 1243 08/14/14 0458  WBC 3.7* 5.5  --  6.6  HGB 9.2* 8.6* 10.5* 6.8*  HCT 27.6* 26.0* 31.0* 21.0*  PLT 167 132*  --  106*  MCV 97.8 98.9  --  100.0  MCH 32.8 32.7  --  32.4  MCHC 33.5 33.1  --  32.4  RDW 20.1* 19.1*  --  19.5*  LYMPHSABS 1.5  --   --   --   MONOABS 0.3  --   --   --   EOSABS 0.0  --   --   --   BASOSABS 0.0  --   --   --     Chemistries   Recent Labs Lab 08/13/14 1216 08/13/14 1243 08/14/14 0458  NA 133* 140 135  K 3.7 3.9 3.6  CL 99* 99* 105  CO2 25  --  25  GLUCOSE 152* 156* 98  BUN 21* 25* 23*  CREATININE 1.41* 1.40* 1.10*  CALCIUM 9.5  --  8.2*    CBG:  Recent Labs Lab 08/13/14 1255  GLUCAP 117*    GFR Estimated Creatinine Clearance: 31.7 mL/min (by C-G formula based on Cr of 1.1).  Coagulation profile  Recent Labs Lab 08/13/14 1646  INR 1.35    Cardiac Enzymes No results for input(s): CKMB, TROPONINI, MYOGLOBIN in the last 168 hours.  Invalid input(s): CK  Invalid input(s): POCBNP No results for input(s): DDIMER in the last 72 hours. No results for input(s): HGBA1C in the last 72 hours. No results for input(s): CHOL, HDL, LDLCALC, TRIG, CHOLHDL, LDLDIRECT in the last 72 hours. No results for input(s): TSH, T4TOTAL, T3FREE, THYROIDAB in the last 72 hours.  Invalid input(s): FREET3  Recent Labs  08/14/14 0735  VITAMINB12 169*  FOLATE 7.9  FERRITIN 407*  TIBC 209*  IRON 47  RETICCTPCT 2.7    Recent Labs  08/13/14 1216  LIPASE 21*    Urine Studies No results for input(s): UHGB, CRYS in the last 72 hours.  Invalid input(s): UACOL, UAPR, USPG, UPH, UTP, UGL, UKET, UBIL, UNIT, UROB, ULEU, UEPI, UWBC, URBC, UBAC, CAST, UCOM, BILUA  MICROBIOLOGY: Recent Results (from the past 240 hour(s))  Blood culture  (routine x 2)     Status: None (Preliminary result)   Collection Time: 08/13/14 12:20 PM  Result Value Ref Range Status   Specimen Description BLOOD RIGHT ARM  Final   Special Requests BOTTLES DRAWN AEROBIC AND ANAEROBIC B 10CC R 5CC  Final   Culture   Final           BLOOD CULTURE RECEIVED NO GROWTH TO DATE CULTURE WILL BE HELD FOR 5 DAYS BEFORE ISSUING A FINAL NEGATIVE REPORT Performed at  [facility]    Report Status PENDING  Incomplete  Blood culture (routine x 2)     Status: None (Preliminary result)   Collection Time: 08/13/14 12:30 PM  Result Value Ref Range Status   Specimen Description BLOOD RIGHT HAND  Final   Special Requests BOTTLES DRAWN AEROBIC ONLY 3CC  Final   Culture   Final           BLOOD CULTURE RECEIVED NO GROWTH TO DATE CULTURE WILL BE HELD FOR 5 DAYS BEFORE ISSUING A FINAL NEGATIVE REPORT Performed at [facility]    Report Status PENDING  Incomplete    RADIOLOGY STUDIES/RESULTS: Ct Head Wo Contrast  08/13/2014   CLINICAL DATA:  Fall while standing, weakness  EXAM: CT HEAD WITHOUT CONTRAST  TECHNIQUE: Contiguous axial images were obtained from the base of the skull through the vertex without intravenous contrast.  COMPARISON:  None.  FINDINGS: Bony calvarium is intact. No gross soft tissue abnormality is noted. Paranasal sinuses and mastoid air cells are well aerated. No findings to suggest acute hemorrhage, acute infarction or space-occupying mass lesion are noted. Very mild areas of prior ischemic change are seen. No acute abnormality is noted.  IMPRESSION: Mild chronic white matter ischemic changes noted. No acute abnormality is noted.   Electronically Signed   By: Inez Catalina M.D.   On: 08/13/2014 14:59   Dg Chest Port 1 View  08/13/2014   CLINICAL DATA:  Per ED note: Per [facility], pt is from home, got up this morning to go to the bathroom and fell from a standing position Forward to her knees and hands due to weakness. Pt denies injury, no  neck or back pain. Pt mildly febrile 100.3, and difficulty urinating for several weeks. Pt also has hx of enemia. Pt has had two or three transfusions. Scheduled for one Friday and they called her to say her hemoglobin was up and she didn't need it. CBG 117. AAOX4, c/o pain below her belly button. 86 in R handH/O CABG X 2 in 2005, COPD, HTN, cardiac cath in 2015.  EXAM: PORTABLE CHEST - 1 VIEW  COMPARISON:  06/22/2014  FINDINGS: Cardiac silhouette is mildly enlarged. Changes from CABG surgery are stable. No mediastinal or hilar masses.  No lung consolidation or edema. No pleural effusion or pneumothorax.  Bony thorax is demineralized but grossly intact.  There are carotid calcifications in the neck.  IMPRESSION: No acute cardiopulmonary disease.   Electronically Signed   By: Lajean Manes M.D.   On: 08/13/2014 12:53   Dg Bone Survey Met  07/20/2014   CLINICAL DATA:  Plasma cell disorder question myeloma lesions, upper back pain for couple weeks, COPD, hypertension, heart murmur  EXAM: METASTATIC BONE SURVEY  COMPARISON:  Chest radiograph 06/22/2014  FINDINGS: No focal calvarial abnormalities.  Diffuse degenerative disc and facet disease changes throughout the cervical, thoracic and lumbar spine without focal spinal abnormality.  Cardiac silhouette enlargement postsurgical with changes of CABG.  Extensive atherosclerotic calcifications aorta and BILATERAL carotid arteries greater on LEFT.  Suspected bone island at the RIGHT femoral head.  No lytic or destructive bone lesions identified to suggest myeloma involvement.  IMPRESSION: No radiographic evidence of myelomatous involvement.  Scattered degenerative changes of the cervical, thoracic, and lumbar spine.  Extensive atherosclerotic calcifications of the aorta, iliac arteries and BILATERAL carotid arteries.   Electronically Signed   By: Lavonia Dana M.D.   On: 07/20/2014 16:22    Oren Binet, MD  [facility]  Pager:[phone]  If 7PM-7AM, please  contact night-coverage [email or web address] Password [email or web address] 08/14/2014, 10:59 AM   LOS: 1 day

## 2014-08-14 NOTE — Progress Notes (Signed)
OT Cancellation Note  Patient Details Name: [name] MRN: [id number] DOB: [birth year]   Cancelled Treatment:    Reason Eval/Treat Not Completed: Other (comment). Pt hemoglobin at 6.8 and receiving blood this am. Will try to see later as time allows.  Benito Mccreedy OTR/L [phone] 08/14/2014, 10:36 AM

## 2014-08-15 ENCOUNTER — Inpatient Hospital Stay (HOSPITAL_COMMUNITY): Payer: Medicare Other

## 2014-08-15 DIAGNOSIS — E440 Moderate protein-calorie malnutrition: Secondary | ICD-10-CM

## 2014-08-15 DIAGNOSIS — J42 Unspecified chronic bronchitis: Secondary | ICD-10-CM

## 2014-08-15 LAB — CBC
HCT: 27.9 % — ABNORMAL LOW (ref 36.0–46.0)
Hemoglobin: 9.3 g/dL — ABNORMAL LOW (ref 12.0–15.0)
MCH: 31.6 pg (ref 26.0–34.0)
MCHC: 33.3 g/dL (ref 30.0–36.0)
MCV: 94.9 fL (ref 78.0–100.0)
PLATELETS: 127 10*3/uL — AB (ref 150–400)
RBC: 2.94 MIL/uL — ABNORMAL LOW (ref 3.87–5.11)
RDW: 20.3 % — AB (ref 11.5–15.5)
WBC: 7.9 10*3/uL (ref 4.0–10.5)

## 2014-08-15 LAB — BASIC METABOLIC PANEL
Anion gap: 8 (ref 5–15)
BUN: 24 mg/dL — AB (ref 6–20)
CO2: 28 mmol/L (ref 22–32)
CREATININE: 1.41 mg/dL — AB (ref 0.44–1.00)
Calcium: 8.9 mg/dL (ref 8.9–10.3)
Chloride: 100 mmol/L — ABNORMAL LOW (ref 101–111)
GFR calc Af Amer: 39 mL/min — ABNORMAL LOW (ref >60)
GFR, EST NON AFRICAN AMERICAN: 34 mL/min — AB (ref >60)
GLUCOSE: 101 mg/dL — AB (ref 70–99)
Potassium: 3.6 mmol/L (ref 3.5–5.1)
Sodium: 136 mmol/L (ref 135–145)

## 2014-08-15 LAB — URINE CULTURE: Colony Count: >=100000

## 2014-08-15 LAB — TYPE AND SCREEN
ABO/RH(D): A NEG
ANTIBODY SCREEN: NEGATIVE
UNIT DIVISION: 0

## 2014-08-15 MED ORDER — SODIUM CHLORIDE 0.9 % IV SOLN
250.0000 mg | Freq: Three times a day (TID) | INTRAVENOUS | Status: DC
  Administered 2014-08-15 – 2014-08-17 (×7): 250 mg via INTRAVENOUS
  Filled 2014-08-15 (×10): qty 250

## 2014-08-15 MED ORDER — ENOXAPARIN SODIUM 30 MG/0.3ML ~~LOC~~ SOLN
30.0000 mg | SUBCUTANEOUS | Status: DC
  Administered 2014-08-16 – 2014-08-17 (×2): 30 mg via SUBCUTANEOUS
  Filled 2014-08-15 (×2): qty 0.3

## 2014-08-15 MED ORDER — POTASSIUM CHLORIDE CRYS ER 20 MEQ PO TBCR
20.0000 meq | EXTENDED_RELEASE_TABLET | Freq: Every day | ORAL | Status: DC
  Administered 2014-08-15 – 2014-08-17 (×3): 20 meq via ORAL
  Filled 2014-08-15 (×3): qty 1

## 2014-08-15 NOTE — Progress Notes (Signed)
ANTIBIOTIC CONSULT NOTE - INITIAL  Pharmacy Consult for Primaxin Indication: UTI (ESBL Klebsiella Pneumoniae)  Allergies  Allergen Reactions  . Lexapro [Escitalopram Oxalate] Other (See Comments)    Fatigue   . Soma [Carisoprodol] Other (See Comments)    Fatigue   . Wellbutrin [Bupropion] Other (See Comments)    "couldn't function and take it"  . Albuterol Sulfate Other (See Comments)    Albuterol HFA inhaler caused nervousness   . Codeine Hives  . Levaquin [Levofloxacin In D5w] Other (See Comments)    Unknown allergic reaction  . Plavix [Clopidogrel Bisulfate] Other (See Comments)    Unknown reaction  . Statins     Patient Measurements: Height: 5\' 2"  (157.5 cm) Weight: 132 lb (59.875 kg) IBW/kg (Calculated) : 50.1  Vital Signs: Temp: 98.6 F (37 C) (05/10 1338) Temp Source: Oral (05/10 0513) BP: 127/45 mmHg (05/10 1338) Pulse Rate: 72 (05/10 1338) Intake/Output from previous day: 05/09 0701 - 05/10 0700 In: 1015 [P.O.:600; Blood:415] Out: 2200 [Urine:2200] Intake/Output from this shift: Total I/O In: 400 [P.O.:400] Out: 100 [Urine:100]  Labs:  Recent Labs  08/13/14 1216 08/13/14 1243 08/14/14 0458 08/14/14 1607 08/15/14 0530  WBC 5.5  --  6.6  --  7.9  HGB 8.6* 10.5* 6.8* 8.5* 9.3*  PLT 132*  --  106*  --  127*  CREATININE 1.41* 1.40* 1.10*  --  1.41*   Estimated Creatinine Clearance: 24.7 mL/min (by C-G formula based on Cr of 1.41). No results for input(s): VANCOTROUGH, VANCOPEAK, VANCORANDOM, GENTTROUGH, GENTPEAK, GENTRANDOM, TOBRATROUGH, TOBRAPEAK, TOBRARND, AMIKACINPEAK, AMIKACINTROU, AMIKACIN in the last 72 hours.   Microbiology: Recent Results (from the past 720 hour(s))  Blood culture (routine x 2)     Status: None (Preliminary result)   Collection Time: 08/13/14 12:20 PM  Result Value Ref Range Status   Specimen Description BLOOD RIGHT ARM  Final   Special Requests BOTTLES DRAWN AEROBIC AND ANAEROBIC B 10CC R 5CC  Final   Culture   Final          BLOOD CULTURE RECEIVED NO GROWTH TO DATE CULTURE WILL BE HELD FOR 5 DAYS BEFORE ISSUING A FINAL NEGATIVE REPORT Performed at [facility]    Report Status PENDING  Incomplete  Blood culture (routine x 2)     Status: None (Preliminary result)   Collection Time: 08/13/14 12:30 PM  Result Value Ref Range Status   Specimen Description BLOOD RIGHT HAND  Final   Special Requests BOTTLES DRAWN AEROBIC ONLY 3CC  Final   Culture   Final           BLOOD CULTURE RECEIVED NO GROWTH TO DATE CULTURE WILL BE HELD FOR 5 DAYS BEFORE ISSUING A FINAL NEGATIVE REPORT Performed at [facility]    Report Status PENDING  Incomplete  Urine culture     Status: None   Collection Time: 08/13/14  1:23 PM  Result Value Ref Range Status   Specimen Description URINE, CATHETERIZED  Final   Special Requests NONE  Final   Colony Count   Final    >=100,000 COLONIES/ML Performed at [facility]    Culture   Final    KLEBSIELLA PNEUMONIAE Note: Confirmed Extended Spectrum Beta-Lactamase Producer (ESBL) CRITICAL RESULT CALLED TO, READ BACK BY AND VERIFIED WITH: CINDY 5/10 AT 1442 BY DUNNJ Performed at [facility]    Report Status 08/15/2014 FINAL  Final   Organism ID, Bacteria KLEBSIELLA PNEUMONIAE  Final      Susceptibility  Klebsiella pneumoniae - MIC*    AMPICILLIN >=32 RESISTANT Resistant     CEFAZOLIN >=64 RESISTANT Resistant     CEFTRIAXONE >=64 RESISTANT Resistant     CIPROFLOXACIN INTERMEDIATE      GENTAMICIN <=1 SENSITIVE Sensitive     LEVOFLOXACIN >=8 RESISTANT Resistant     NITROFURANTOIN 32 SENSITIVE Sensitive     TOBRAMYCIN <=1 SENSITIVE Sensitive     TRIMETH/SULFA <=20 SENSITIVE Sensitive     IMIPENEM <=0.25 SENSITIVE Sensitive     PIP/TAZO 8 SENSITIVE Sensitive     * KLEBSIELLA PNEUMONIAE   Assessment: 11 YOF with urosepsis on D#3 rocephin. Urine culture positive for ESBL Klebsiella Pneumoniae. Pharmacy is consulted to switch abx to Primaxin. Tm 99.1,  wbc 6.9. Scr 1.41, Est. crcl ~ 25 ml/min.  Plan:  - Primaxin 250 mg IV Q 8 hrs - Monitor renal function and clinical improvement.  - D/C rocephin  Maryanna Shape, PharmD, BCPS  Clinical Pharmacist  Pager: [phone]   08/15/2014,3:06 PM

## 2014-08-15 NOTE — Progress Notes (Signed)
Utilization review completed.  

## 2014-08-15 NOTE — Progress Notes (Addendum)
PATIENT DETAILS Name: [name] Age: 81 y.o. Sex: female Date of Birth: 11/12/1931 Admit Date: 08/13/2014 Admitting Physician Bonnielee Haff, MD KCL:EXNTZG,YFVCBS, MD  Subjective: Pt lying in bed comfortably.  No apparent distress-apparently had some shortness of breath last night.  Assessment/Plan: Principal Problem:   UTI (lower urinary tract infection) with sepsis: Improving, continue with IV Rocephin. Remains afebrile. Sepsis physiology has resolved. Preliminary urine culture shows gram negative rods, blood culture negative so far.  Active Problems:  Mechanical fall: Suspect from weakness-due to above. CT head negative for acute abnormalities.  Nonfocal exam. Orthostatics negative on admission. PT evaluation completed-no further recommendations.   Anemia: Suspect worsening anemia from IV fluid and acute illness. No evidence of blood loss. Has history of chronic anemia-being worked up as an outpatient for possible multiple myeloma-bone marrow biopsy being contemplated in the near future. Since hemoglobin down to 6.8 on 08/14/14 given one unit of blood.  Hemoglobin has now improved to 9.3 today.  Will defer further evaluation to the outpatient setting when she has recovered from acute illness.  Much improved. Follow    Dyslipidemia: Continue Zetia   GERD (gastroesophageal reflux disease): Continue PPI   COPD (chronic obstructive pulmonary disease): Lungs clear-appears stable-continue Spiriva, continue as needed bronchodilators.  Chest XRAY neg for acute findings.     Hypertension: Resume HCTZ. Follow     Mild thrombocytopenia: Follow-watch closely while on Lovenox prophylactic   CAD (coronary artery disease) of artery bypass graft, atretic LIMA: Without any chest pain-continue aspirin    Chronic disease stage III: Follow closely. Creatinine close to baseline.   Malnutrition of severe degree: Nutrition evaluation.  Ensure Enlive recommended by  dietician. Follow.  Disposition: Remain inpatient- home in the next 1-2 days. Await urine culture.  Antimicrobial agents  See below  Anti-infectives    Start     Dose/Rate Route Frequency Ordered Stop   08/14/14 1000  cefTRIAXone (ROCEPHIN) 1 g in dextrose 5 % 50 mL IVPB - Premix     1 g 100 mL/hr over 30 Minutes Intravenous Every 24 hours 08/13/14 1615     08/13/14 1445  cefTRIAXone (ROCEPHIN) 2 g in dextrose 5 % 50 mL IVPB     2 g 100 mL/hr over 30 Minutes Intravenous  Once 08/13/14 1438 08/13/14 1538      DVT Prophylaxis: Prophylactic Lovenox - watch platelets   Code Status: Full code   Family Communication None present.  Plan discussed with pt.   Procedures: Echo   CONSULTS:  rehabilitation medicine, Nutritional Services   Time spent 40 minutes-Greater than 50% of this time was spent in counseling, explanation of diagnosis, planning of further management, and coordination of care.  MEDICATIONS: Scheduled Meds: . aspirin  325 mg Oral Daily  . cefTRIAXone (ROCEPHIN)  IV  1 g Intravenous Q24H  . clonazePAM  0.25 mg Oral QHS  . enoxaparin (LOVENOX) injection  40 mg Subcutaneous Q24H  . ezetimibe  10 mg Oral QHS  . feeding supplement (ENSURE ENLIVE)  237 mL Oral BID BM  . pantoprazole  40 mg Oral BID  . polyethylene glycol  17 g Oral QHS  . sodium chloride  3 mL Intravenous Q12H   Continuous Infusions:  PRN Meds:.acetaminophen **OR** acetaminophen, albuterol, ondansetron **OR** ondansetron (ZOFRAN) IV, oxyCODONE, prochlorperazine, tiotropium    PHYSICAL EXAM: Vital signs in last 24 hours: Filed Vitals:   08/14/14 2142 08/15/14 0116 08/15/14 0118  08/15/14 0513  BP: 127/55   148/96  Pulse: 69   43  Temp: 98.1 F (36.7 C)   97.7 F (36.5 C)  TempSrc: Oral   Oral  Resp: 16   20  Height:      Weight:      SpO2: 94% 91% 95% 96%    Weight change:  Filed Weights   08/13/14 1203  Weight: 59.875 kg (132 lb)   Body mass index is 24.14 kg/(m^2).   Gen  Exam: Awake and alert with clear speech.  Sitting up in with a slight tremor.  Neck: Supple, No JVD.   Chest: B/L Clear.  BL air movement.  CVS: S1 S2 Regular Abdomen: soft, BS +, non tender, non distended.  Extremities: no edema, lower extremities warm to touch. Neurologic: Non Focal.   Skin: No Rash.   Wounds: N/A.    Intake/Output from previous day:  Intake/Output Summary (Last 24 hours) at 08/15/14 1107 Last data filed at 08/15/14 1001  Gross per 24 hour  Intake    985 ml  Output   2300 ml  Net  -1315 ml     LAB RESULTS: CBC  Recent Labs Lab 08/09/14 1354 08/13/14 1216 08/13/14 1243 08/14/14 0458 08/14/14 1607 08/15/14 0530  WBC 3.7* 5.5  --  6.6  --  7.9  HGB 9.2* 8.6* 10.5* 6.8* 8.5* 9.3*  HCT 27.6* 26.0* 31.0* 21.0* 25.6* 27.9*  PLT 167 132*  --  106*  --  127*  MCV 97.8 98.9  --  100.0  --  94.9  MCH 32.8 32.7  --  32.4  --  31.6  MCHC 33.5 33.1  --  32.4  --  33.3  RDW 20.1* 19.1*  --  19.5*  --  20.3*  LYMPHSABS 1.5  --   --   --   --   --   MONOABS 0.3  --   --   --   --   --   EOSABS 0.0  --   --   --   --   --   BASOSABS 0.0  --   --   --   --   --     Chemistries   Recent Labs Lab 08/13/14 1216 08/13/14 1243 08/14/14 0458 08/15/14 0530  NA 133* 140 135 136  K 3.7 3.9 3.6 3.6  CL 99* 99* 105 100*  CO2 25  --  25 28  GLUCOSE 152* 156* 98 101*  BUN 21* 25* 23* 24*  CREATININE 1.41* 1.40* 1.10* 1.41*  CALCIUM 9.5  --  8.2* 8.9    CBG:  Recent Labs Lab 08/13/14 1255  GLUCAP 117*    GFR Estimated Creatinine Clearance: 24.7 mL/min (by C-G formula based on Cr of 1.41).  Coagulation profile  Recent Labs Lab 08/13/14 1646  INR 1.35    Cardiac Enzymes No results for input(s): CKMB, TROPONINI, MYOGLOBIN in the last 168 hours.  Invalid input(s): CK  Invalid input(s): POCBNP No results for input(s): DDIMER in the last 72 hours. No results for input(s): HGBA1C in the last 72 hours. No results for input(s): CHOL, HDL, LDLCALC,  TRIG, CHOLHDL, LDLDIRECT in the last 72 hours. No results for input(s): TSH, T4TOTAL, T3FREE, THYROIDAB in the last 72 hours.  Invalid input(s): FREET3  Recent Labs  08/14/14 0735  VITAMINB12 169*  FOLATE 7.9  FERRITIN 407*  TIBC 209*  IRON 47  RETICCTPCT 2.7    Recent Labs  08/13/14 1216  LIPASE  21*    Urine Studies No results for input(s): UHGB, CRYS in the last 72 hours.  Invalid input(s): UACOL, UAPR, USPG, UPH, UTP, UGL, UKET, UBIL, UNIT, UROB, ULEU, UEPI, UWBC, URBC, UBAC, CAST, UCOM, BILUA  MICROBIOLOGY: Recent Results (from the past 240 hour(s))  Blood culture (routine x 2)     Status: None (Preliminary result)   Collection Time: 08/13/14 12:20 PM  Result Value Ref Range Status   Specimen Description BLOOD RIGHT ARM  Final   Special Requests BOTTLES DRAWN AEROBIC AND ANAEROBIC B 10CC R 5CC  Final   Culture   Final           BLOOD CULTURE RECEIVED NO GROWTH TO DATE CULTURE WILL BE HELD FOR 5 DAYS BEFORE ISSUING A FINAL NEGATIVE REPORT Performed at [facility]    Report Status PENDING  Incomplete  Blood culture (routine x 2)     Status: None (Preliminary result)   Collection Time: 08/13/14 12:30 PM  Result Value Ref Range Status   Specimen Description BLOOD RIGHT HAND  Final   Special Requests BOTTLES DRAWN AEROBIC ONLY 3CC  Final   Culture   Final           BLOOD CULTURE RECEIVED NO GROWTH TO DATE CULTURE WILL BE HELD FOR 5 DAYS BEFORE ISSUING A FINAL NEGATIVE REPORT Performed at [facility]    Report Status PENDING  Incomplete  Urine culture     Status: None (Preliminary result)   Collection Time: 08/13/14  1:23 PM  Result Value Ref Range Status   Specimen Description URINE, CATHETERIZED  Final   Special Requests NONE  Final   Colony Count   Final    >=100,000 COLONIES/ML Performed at [facility]    Culture   Final    [place] Performed at [facility]    Report Status PENDING  Incomplete     RADIOLOGY STUDIES/RESULTS: Ct Head Wo Contrast  08/13/2014   CLINICAL DATA:  Fall while standing, weakness  EXAM: CT HEAD WITHOUT CONTRAST  TECHNIQUE: Contiguous axial images were obtained from the base of the skull through the vertex without intravenous contrast.  COMPARISON:  None.  FINDINGS: Bony calvarium is intact. No gross soft tissue abnormality is noted. Paranasal sinuses and mastoid air cells are well aerated. No findings to suggest acute hemorrhage, acute infarction or space-occupying mass lesion are noted. Very mild areas of prior ischemic change are seen. No acute abnormality is noted.  IMPRESSION: Mild chronic white matter ischemic changes noted. No acute abnormality is noted.   Electronically Signed   By: Inez Catalina M.D.   On: 08/13/2014 14:59   Dg Chest Port 1 View  08/15/2014   CLINICAL DATA:  Shortness of breath this morning.  EXAM: PORTABLE CHEST - 1 VIEW  COMPARISON:  08/13/2014  FINDINGS: Sternotomy wires unchanged. Lungs are adequately inflated without focal consolidation or effusion. Mild stable blunting of the left costophrenic angle. Stable borderline cardiomegaly. Remainder of the exam is unchanged.  IMPRESSION: No active disease.   Electronically Signed   By: Marin Olp M.D.   On: 08/15/2014 08:20   Dg Chest Port 1 View  08/13/2014   CLINICAL DATA:  Per ED note: Per [facility], pt is from home, got up this morning to go to the bathroom and fell from a standing position Forward to her knees and hands due to weakness. Pt denies injury, no neck or back pain. Pt mildly febrile 100.3, and difficulty  urinating for several weeks. Pt also has hx of enemia. Pt has had two or three transfusions. Scheduled for one Friday and they called her to say her hemoglobin was up and she didn't need it. CBG 117. AAOX4, c/o pain below her belly button. 38 in R handH/O CABG X 2 in 2005, COPD, HTN, cardiac cath in 2015.  EXAM: PORTABLE CHEST - 1 VIEW  COMPARISON:  06/22/2014  FINDINGS: Cardiac  silhouette is mildly enlarged. Changes from CABG surgery are stable. No mediastinal or hilar masses.  No lung consolidation or edema. No pleural effusion or pneumothorax.  Bony thorax is demineralized but grossly intact.  There are carotid calcifications in the neck.  IMPRESSION: No acute cardiopulmonary disease.   Electronically Signed   By: Lajean Manes M.D.   On: 08/13/2014 12:53   Dg Bone Survey Met  07/20/2014   CLINICAL DATA:  Plasma cell disorder question myeloma lesions, upper back pain for couple weeks, COPD, hypertension, heart murmur  EXAM: METASTATIC BONE SURVEY  COMPARISON:  Chest radiograph 06/22/2014  FINDINGS: No focal calvarial abnormalities.  Diffuse degenerative disc and facet disease changes throughout the cervical, thoracic and lumbar spine without focal spinal abnormality.  Cardiac silhouette enlargement postsurgical with changes of CABG.  Extensive atherosclerotic calcifications aorta and BILATERAL carotid arteries greater on LEFT.  Suspected bone island at the RIGHT femoral head.  No lytic or destructive bone lesions identified to suggest myeloma involvement.  IMPRESSION: No radiographic evidence of myelomatous involvement.  Scattered degenerative changes of the cervical, thoracic, and lumbar spine.  Extensive atherosclerotic calcifications of the aorta, iliac arteries and BILATERAL carotid arteries.   Electronically Signed   By: Lavonia Dana M.D.   On: 07/20/2014 16:22    Marlou Starks [place] PA-S [facility] Pager:[phone]  If 7PM-7AM, please contact night-coverage [email or web address] Password [email or web address] 08/15/2014, 11:07 AM   LOS: 2 days   Attending MD note  Patient was seen, examined,treatment plan was discussed with the PA-S.  I have personally reviewed the clinical findings, lab, imaging studies and management of this patient in detail. I agree with the documentation, as recorded by the PA-S.   Doing well, urine cultures currently pending-preliminary culture shows  gram-negative rods. Developed some mild respiratory distress last night, currently lungs completely clear, no edema as well. Resume diuretics-HCTZ, continue IV Rocephin, suspect requires another day for further optimization. Likely home the next 1-2 days.   [facility]

## 2014-08-15 NOTE — Progress Notes (Signed)
Per lab. Pt growing ESBL in urine. Ghimire MD notified and made aware.

## 2014-08-15 NOTE — Clinical Documentation Improvement (Signed)
MD's, NP's and PA's  Sepsis was documented in this record with only 1 SIRS criteria (Temp), Lactic acid  (2.85/2.3) and UTI. Per [place]'s Order set: 2 (SIRS criteria) must be met along with infection source and elevated lactic acid level. Please provide the additional clinical indicator in your documentation to support the diagnosis of Sepsis if appropriate for this admission.  Thank you!  Criteria for of Sepsis Identification:   2. Two or more of the following SIRS criteria:       Temp> 38.3 (100.69f or < 36 98.98F)       Heart rate > 90 bpm       Respiratory rate > 20 bths/min or PaCo2 < 32 mmHg        WBC > 12,000 or < 4000 cells/mm3 or > 10% bands   Risk Factors : UTI, Mod Malnutrition  Diagnostics : Blood cultures x 2 no growth at day 2   Treatment : IV Rocephin 1g IV  every 24 hours   Thank YSharlette DenseAddison  Clinical Documentation Specialist: [id number]

## 2014-08-16 LAB — BASIC METABOLIC PANEL
Anion gap: 9 (ref 5–15)
BUN: 23 mg/dL — ABNORMAL HIGH (ref 6–20)
CHLORIDE: 99 mmol/L — AB (ref 101–111)
CO2: 25 mmol/L (ref 22–32)
Calcium: 8.7 mg/dL — ABNORMAL LOW (ref 8.9–10.3)
Creatinine, Ser: 1.15 mg/dL — ABNORMAL HIGH (ref 0.44–1.00)
GFR calc Af Amer: 50 mL/min — ABNORMAL LOW (ref >60)
GFR calc non Af Amer: 43 mL/min — ABNORMAL LOW (ref >60)
GLUCOSE: 101 mg/dL — AB (ref 70–99)
POTASSIUM: 4.7 mmol/L (ref 3.5–5.1)
SODIUM: 133 mmol/L — AB (ref 135–145)

## 2014-08-16 MED ORDER — SENNOSIDES-DOCUSATE SODIUM 8.6-50 MG PO TABS
2.0000 | ORAL_TABLET | Freq: Two times a day (BID) | ORAL | Status: DC
  Administered 2014-08-16 – 2014-08-17 (×2): 2 via ORAL
  Filled 2014-08-16 (×2): qty 2

## 2014-08-16 MED ORDER — PNEUMOCOCCAL VAC POLYVALENT 25 MCG/0.5ML IJ INJ
0.5000 mL | INJECTION | INTRAMUSCULAR | Status: AC
  Administered 2014-08-17: 0.5 mL via INTRAMUSCULAR
  Filled 2014-08-16: qty 0.5

## 2014-08-16 NOTE — Progress Notes (Signed)
PATIENT DETAILS Name: [name] Age: 81 y.o. Sex: female Date of Birth: [birth year] Admit Date: 08/13/2014 Admitting Physician Bonnielee Haff, MD DTH:[email or web address],[email or web address], MD  Subjective: Sitting on the edge of bed, denies pain, no sob, reported feeling better. Does report constipation, no bm for the last three days  Assessment/Plan: Principal Problem:   UTI (lower urinary tract infection) with sepsis:  Improving, Urine culure MDR kleb pneumo, abx changed to imipenem.   Active Problems:  Mechanical fall: Suspect from weakness-due to above. CT head negative for acute abnormalities.  Nonfocal exam. Orthostatics negative on admission. PT evaluation completed-no further recommendations.   Anemia: Suspect worsening anemia from IV fluid and acute illness. No evidence of blood loss. Has history of chronic anemia-being worked up as an outpatient for possible multiple myeloma-bone marrow biopsy being contemplated in the near future. Since hemoglobin down to 6.8 on 08/14/14 given one unit of blood.  Hemoglobin has now improved to 9.3 today.  Will defer further evaluation to the outpatient setting when she has recovered from acute illness.  Much improved. Follow    Dyslipidemia: Continue Zetia   GERD (gastroesophageal reflux disease): Continue PPI   COPD (chronic obstructive pulmonary disease): Lungs clear-appears stable-continue Spiriva, continue as needed bronchodilators.  Chest XRAY neg for acute findings.     Hypertension: Resume HCTZ. Follow     Mild thrombocytopenia: Follow-watch closely while on Lovenox prophylactic   CAD (coronary artery disease) of artery bypass graft, atretic LIMA: Without any chest pain-continue aspirin    Chronic disease stage III: Follow closely. Creatinine close to baseline.   Malnutrition of severe degree: Nutrition evaluation.  Ensure Enlive recommended by dietician. Follow.  Disposition: Remain inpatient- home in the next 1-2 days.    Antimicrobial agents  See below  Anti-infectives    Start     Dose/Rate Route Frequency Ordered Stop   08/15/14 1600  imipenem-cilastatin (PRIMAXIN) 250 mg in sodium chloride 0.9 % 100 mL IVPB     250 mg 200 mL/hr over 30 Minutes Intravenous 3 times per day 08/15/14 1513     08/14/14 1000  cefTRIAXone (ROCEPHIN) 1 g in dextrose 5 % 50 mL IVPB - Premix  Status:  Discontinued     1 g 100 mL/hr over 30 Minutes Intravenous Every 24 hours 08/13/14 1615 08/15/14 1515   08/13/14 1445  cefTRIAXone (ROCEPHIN) 2 g in dextrose 5 % 50 mL IVPB     2 g 100 mL/hr over 30 Minutes Intravenous  Once 08/13/14 1438 08/13/14 1538      DVT Prophylaxis: Prophylactic Lovenox - watch platelets   Code Status: Full code   Family Communication None present.  Plan discussed with pt.   Procedures: Echo   CONSULTS:  rehabilitation medicine, Nutritional Services   Time spent 40 minutes-Greater than 50% of this time was spent in counseling, explanation of diagnosis, planning of further management, and coordination of care.  MEDICATIONS: Scheduled Meds: . aspirin  325 mg Oral Daily  . clonazePAM  0.25 mg Oral QHS  . enoxaparin (LOVENOX) injection  30 mg Subcutaneous Q24H  . ezetimibe  10 mg Oral QHS  . feeding supplement (ENSURE ENLIVE)  237 mL Oral BID BM  . imipenem-cilastatin  250 mg Intravenous 3 times per day  . pantoprazole  40 mg Oral BID  . [START ON 08/17/2014] pneumococcal 23 valent vaccine  0.5 mL Intramuscular Tomorrow-1000  . polyethylene glycol  17 g Oral  QHS  . potassium chloride  20 mEq Oral Daily  . senna-docusate  2 tablet Oral BID  . sodium chloride  3 mL Intravenous Q12H   Continuous Infusions:  PRN Meds:.acetaminophen **OR** acetaminophen, albuterol, ondansetron **OR** ondansetron (ZOFRAN) IV, oxyCODONE, prochlorperazine, tiotropium    PHYSICAL EXAM: Vital signs in last 24 hours: Filed Vitals:   08/15/14 1338 08/15/14 2137 08/16/14 0510 08/16/14 1326  BP: 127/45  151/79 140/46 144/54  Pulse: 72 58 63 60  Temp: 98.6 F (37 C) 98.6 F (37 C) 99.7 F (37.6 C) 98.2 F (36.8 C)  TempSrc:  Oral Oral Oral  Resp: $Remo'18 18 18 20  'TnMow$ Height:      Weight:      SpO2: 98% 94% 92% 96%    Weight change:  Filed Weights   08/13/14 1203  Weight: 59.875 kg (132 lb)   Body mass index is 24.14 kg/(m^2).   Gen Exam: Awake and alert with clear speech.  Sitting up in with a slight tremor.  Neck: Supple, No JVD.   Chest: B/L Clear.  BL air movement.  CVS: S1 S2 Regular Abdomen: soft, BS +, non tender, non distended.  Extremities: no edema, lower extremities warm to touch. Neurologic: Non Focal.   Skin: No Rash.   Wounds: N/A.    Intake/Output from previous day:  Intake/Output Summary (Last 24 hours) at 08/16/14 2106 Last data filed at 08/16/14 2032  Gross per 24 hour  Intake    640 ml  Output   1570 ml  Net   -930 ml     LAB RESULTS: CBC  Recent Labs Lab 08/13/14 1216 08/13/14 1243 08/14/14 0458 08/14/14 1607 08/15/14 0530  WBC 5.5  --  6.6  --  7.9  HGB 8.6* 10.5* 6.8* 8.5* 9.3*  HCT 26.0* 31.0* 21.0* 25.6* 27.9*  PLT 132*  --  106*  --  127*  MCV 98.9  --  100.0  --  94.9  MCH 32.7  --  32.4  --  31.6  MCHC 33.1  --  32.4  --  33.3  RDW 19.1*  --  19.5*  --  20.3*    Chemistries   Recent Labs Lab 08/13/14 1216 08/13/14 1243 08/14/14 0458 08/15/14 0530 08/16/14 0554  NA 133* 140 135 136 133*  K 3.7 3.9 3.6 3.6 4.7  CL 99* 99* 105 100* 99*  CO2 25  --  $R'25 28 25  'Hy$ GLUCOSE 152* 156* 98 101* 101*  BUN 21* 25* 23* 24* 23*  CREATININE 1.41* 1.40* 1.10* 1.41* 1.15*  CALCIUM 9.5  --  8.2* 8.9 8.7*    CBG:  Recent Labs Lab 08/13/14 1255  GLUCAP 117*    GFR Estimated Creatinine Clearance: 30.3 mL/min (by C-G formula based on Cr of 1.15).  Coagulation profile  Recent Labs Lab 08/13/14 1646  INR 1.35    Cardiac Enzymes No results for input(s): CKMB, TROPONINI, MYOGLOBIN in the last 168 hours.  Invalid input(s):  CK  Invalid input(s): POCBNP No results for input(s): DDIMER in the last 72 hours. No results for input(s): HGBA1C in the last 72 hours. No results for input(s): CHOL, HDL, LDLCALC, TRIG, CHOLHDL, LDLDIRECT in the last 72 hours. No results for input(s): TSH, T4TOTAL, T3FREE, THYROIDAB in the last 72 hours.  Invalid input(s): FREET3  Recent Labs  08/14/14 0735  VITAMINB12 169*  FOLATE 7.9  FERRITIN 407*  TIBC 209*  IRON 47  RETICCTPCT 2.7   No results for input(s): LIPASE, AMYLASE in  the last 72 hours.  Urine Studies No results for input(s): UHGB, CRYS in the last 72 hours.  Invalid input(s): UACOL, UAPR, USPG, UPH, UTP, UGL, UKET, UBIL, UNIT, UROB, ULEU, UEPI, UWBC, URBC, UBAC, CAST, UCOM, BILUA  MICROBIOLOGY: Recent Results (from the past 240 hour(s))  Blood culture (routine x 2)     Status: None (Preliminary result)   Collection Time: 08/13/14 12:20 PM  Result Value Ref Range Status   Specimen Description BLOOD RIGHT ARM  Final   Special Requests BOTTLES DRAWN AEROBIC AND ANAEROBIC B 10CC R 5CC  Final   Culture   Final           BLOOD CULTURE RECEIVED NO GROWTH TO DATE CULTURE WILL BE HELD FOR 5 DAYS BEFORE ISSUING A FINAL NEGATIVE REPORT Performed at [facility]    Report Status PENDING  Incomplete  Blood culture (routine x 2)     Status: None (Preliminary result)   Collection Time: 08/13/14 12:30 PM  Result Value Ref Range Status   Specimen Description BLOOD RIGHT HAND  Final   Special Requests BOTTLES DRAWN AEROBIC ONLY 3CC  Final   Culture   Final           BLOOD CULTURE RECEIVED NO GROWTH TO DATE CULTURE WILL BE HELD FOR 5 DAYS BEFORE ISSUING A FINAL NEGATIVE REPORT Performed at [facility]    Report Status PENDING  Incomplete  Urine culture     Status: None   Collection Time: 08/13/14  1:23 PM  Result Value Ref Range Status   Specimen Description URINE, CATHETERIZED  Final   Special Requests NONE  Final   Colony Count   Final     >=100,000 COLONIES/ML Performed at [facility]    Culture   Final    KLEBSIELLA PNEUMONIAE Note: Confirmed Extended Spectrum Beta-Lactamase Producer (ESBL) CRITICAL RESULT CALLED TO, READ BACK BY AND VERIFIED WITH: CINDY 5/10 AT 1442 BY DUNNJ Performed at [facility]    Report Status 08/15/2014 FINAL  Final   Organism ID, Bacteria KLEBSIELLA PNEUMONIAE  Final      Susceptibility   Klebsiella pneumoniae - MIC*    AMPICILLIN >=32 RESISTANT Resistant     CEFAZOLIN >=64 RESISTANT Resistant     CEFTRIAXONE >=64 RESISTANT Resistant     CIPROFLOXACIN INTERMEDIATE      GENTAMICIN <=1 SENSITIVE Sensitive     LEVOFLOXACIN >=8 RESISTANT Resistant     NITROFURANTOIN 32 SENSITIVE Sensitive     TOBRAMYCIN <=1 SENSITIVE Sensitive     TRIMETH/SULFA <=20 SENSITIVE Sensitive     IMIPENEM <=0.25 SENSITIVE Sensitive     PIP/TAZO 8 SENSITIVE Sensitive     * KLEBSIELLA PNEUMONIAE    RADIOLOGY STUDIES/RESULTS: Ct Head Wo Contrast  08/13/2014   CLINICAL DATA:  Fall while standing, weakness  EXAM: CT HEAD WITHOUT CONTRAST  TECHNIQUE: Contiguous axial images were obtained from the base of the skull through the vertex without intravenous contrast.  COMPARISON:  None.  FINDINGS: Bony calvarium is intact. No gross soft tissue abnormality is noted. Paranasal sinuses and mastoid air cells are well aerated. No findings to suggest acute hemorrhage, acute infarction or space-occupying mass lesion are noted. Very mild areas of prior ischemic change are seen. No acute abnormality is noted.  IMPRESSION: Mild chronic white matter ischemic changes noted. No acute abnormality is noted.   Electronically Signed   By: Inez Catalina M.D.   On: 08/13/2014 14:59   Dg Chest [facility]  08/15/2014   CLINICAL DATA:  Shortness of breath this morning.  EXAM: PORTABLE CHEST - 1 VIEW  COMPARISON:  08/13/2014  FINDINGS: Sternotomy wires unchanged. Lungs are adequately inflated without focal consolidation or effusion. Mild  stable blunting of the left costophrenic angle. Stable borderline cardiomegaly. Remainder of the exam is unchanged.  IMPRESSION: No active disease.   Electronically Signed   By: Marin Olp M.D.   On: 08/15/2014 08:20   Dg Chest Port 1 View  08/13/2014   CLINICAL DATA:  Per ED note: Per [facility], pt is from home, got up this morning to go to the bathroom and fell from a standing position Forward to her knees and hands due to weakness. Pt denies injury, no neck or back pain. Pt mildly febrile 100.3, and difficulty urinating for several weeks. Pt also has hx of enemia. Pt has had two or three transfusions. Scheduled for one Friday and they called her to say her hemoglobin was up and she didn't need it. CBG 117. AAOX4, c/o pain below her belly button. 51 in R handH/O CABG X 2 in 2005, COPD, HTN, cardiac cath in 2015.  EXAM: PORTABLE CHEST - 1 VIEW  COMPARISON:  06/22/2014  FINDINGS: Cardiac silhouette is mildly enlarged. Changes from CABG surgery are stable. No mediastinal or hilar masses.  No lung consolidation or edema. No pleural effusion or pneumothorax.  Bony thorax is demineralized but grossly intact.  There are carotid calcifications in the neck.  IMPRESSION: No acute cardiopulmonary disease.   Electronically Signed   By: Lajean Manes M.D.   On: 08/13/2014 12:53   Dg Bone Survey Met  07/20/2014   CLINICAL DATA:  Plasma cell disorder question myeloma lesions, upper back pain for couple weeks, COPD, hypertension, heart murmur  EXAM: METASTATIC BONE SURVEY  COMPARISON:  Chest radiograph 06/22/2014  FINDINGS: No focal calvarial abnormalities.  Diffuse degenerative disc and facet disease changes throughout the cervical, thoracic and lumbar spine without focal spinal abnormality.  Cardiac silhouette enlargement postsurgical with changes of CABG.  Extensive atherosclerotic calcifications aorta and BILATERAL carotid arteries greater on LEFT.  Suspected bone island at the RIGHT femoral head.  No lytic or  destructive bone lesions identified to suggest myeloma involvement.  IMPRESSION: No radiographic evidence of myelomatous involvement.  Scattered degenerative changes of the cervical, thoracic, and lumbar spine.  Extensive atherosclerotic calcifications of the aorta, iliac arteries and BILATERAL carotid arteries.   Electronically Signed   By: Lavonia Dana M.D.   On: 07/20/2014 16:22    Teryn Boerema  [facility] [phone]  If 7PM-7AM, please contact night-coverage [email or web address] Password [email or web address] 08/16/2014, 9:06 PM   LOS: 3 days    Scottlyn Mchaney MD PhD [facility]

## 2014-08-16 NOTE — Progress Notes (Addendum)
Occupational Therapy Treatment Patient Details Name: [name] MRN: [id number] DOB: Oct 25, 1931 Today's Date: 08/16/2014    History of present illness 81 y.o. female with a past medical history of coronary artery disease, emphysema, and chronic kidney disease stage III, hypertension, chronic anemia. She became dizzy at home and fell.   OT comments  Education provided in session and pt progressing.  Follow Up Recommendations  No OT follow up;Supervision - Intermittent    Equipment Recommendations  3 in 1 bedside comode    Recommendations for Other Services      Precautions / Restrictions Precautions Precautions: Fall Restrictions Weight Bearing Restrictions: No       Mobility Bed Mobility Overal bed mobility: Modified Independent                Transfers Overall transfer level: Modified independent                    Balance    Supervision for ambulation.                                ADL Overall ADL's : Needs assistance/impaired     Grooming: Brushing hair;Supervision/safety;Standing; (item at sink but feel pt could have retrieved at supervision level)               Lower Body Dressing: Sitting/lateral leans;Set up (changed socks-feel pt could have retrieved at supervision level)   Toilet Transfer: Supervision/safety;Ambulation;Regular Toilet           Functional mobility during ADLs: Supervision/safety (standing/sitting) General ADL Comments: Educated on safety such as safe footwear, sitting for most of LB ADLs, and recommended daughter be with pt for showering. Discussed getting 3 in 1 as daughter is not there during the day.  Pt verbalized she knows how to perform tub transfer and has tub bench.      Vision                     Perception     Praxis      Cognition  Awake/Alert Behavior During Therapy: WFL for tasks assessed/performed Overall Cognitive Status: Within Functional Limits for tasks  assessed                       Extremity/Trunk Assessment               Exercises Other Exercises Other Exercises: pt performed theraband exercises with level 1 theraband (elbow extension, shoulder flexion, shoulder abduction)   Shoulder Instructions       General Comments      Pertinent Vitals/ Pain       Pain Assessment: No/denies pain  Home Living                                          Prior Functioning/Environment              Frequency Min 2X/week     Progress Toward Goals  OT Goals(current goals can now be found in the care plan section)  Progress towards OT goals: Progressing toward goals  Acute Rehab OT Goals Patient Stated Goal: get out of hospital OT Goal Formulation: With patient Time For Goal Achievement: 08/21/14 Potential to Achieve Goals: Good ADL Goals Pt Will Perform Lower Body Bathing: with modified independence;sit  to/from stand Pt Will Perform Lower Body Dressing: with modified independence;sit to/from stand Pt Will Transfer to Toilet: with modified independence;ambulating;regular height toilet;grab bars Additional ADL Goal #1: Pt will independently perform bilateral UE exercises to increase strength.  Plan Discharge plan needs to be updated    Co-evaluation                 End of Session Equipment Utilized During Treatment: Other (comment) (theraband)   Activity Tolerance Patient limited by fatigue   Patient Left in bed;with call bell/phone within reach   Nurse Communication          Time: 5217-4715 OT Time Calculation (min): 15 min  Charges: OT General Charges $OT Visit: 1 Procedure OT Treatments $Self Care/Home Management : 8-22 mins   Benito Mccreedy OTR/L [phone] 08/16/2014, 4:58 PM

## 2014-08-16 NOTE — Progress Notes (Signed)
Physical Therapy Treatment Patient Details Name: [name] MRN: [id number] DOB: 04/18/1931 Today's Date: 08/16/2014    History of Present Illness 81 y.o. female with a past medical history of coronary artery disease, emphysema, and chronic kidney disease stage III, hypertension, chronic anemia. She became dizzy at home and fell.    PT Comments    Patient progressing well. No assist  Required for ambulation except min guard/supervision.  Follow Up Recommendations        Equipment Recommendations       Recommendations for Other Services       Precautions / Restrictions Precautions Precautions: Fall Precaution Comments: contact    Mobility  Bed Mobility         Supine to sit: Supervision;HOB elevated     General bed mobility comments: extra time  Transfers Overall transfer level: Modified independent Equipment used: None Transfers: Sit to/from Stand Sit to Stand: Modified independent (Device/Increase time)         General transfer comment: from toilet  Ambulation/Gait Ambulation/Gait assistance: Min guard/supervision Ambulation Distance (Feet): 300 Feet Assistive device: None     Gait velocity interpretation: at or above normal speed for age/gender General Gait Details: close guard but no external assist.   Stairs            Wheelchair Mobility    Modified Rankin (Stroke Patients Only)       Balance             Standing balance-Leahy Scale: Good                      Cognition Arousal/Alertness: Awake/alert                          Exercises      General Comments        Pertinent Vitals/Pain Pain Assessment: No/denies pain    Home Living                      Prior Function            PT Goals (current goals can now be found in the care plan section) Progress towards PT goals: Progressing toward goals    Frequency  Min 3X/week    PT Plan Current plan remains appropriate     Co-evaluation             End of Session   Activity Tolerance: Patient tolerated treatment well Patient left: in chair;with chair alarm set     Time: 1157-2620 PT Time Calculation (min) (ACUTE ONLY): 20 min  Charges:  $Gait Training: 8-22 mins                    G Codes:      Claretha Cooper 08/16/2014, 12:09 PM

## 2014-08-17 DIAGNOSIS — A419 Sepsis, unspecified organism: Secondary | ICD-10-CM | POA: Diagnosis not present

## 2014-08-17 LAB — CBC
HCT: 26.3 % — ABNORMAL LOW (ref 36.0–46.0)
Hemoglobin: 8.6 g/dL — ABNORMAL LOW (ref 12.0–15.0)
MCH: 31.6 pg (ref 26.0–34.0)
MCHC: 32.7 g/dL (ref 30.0–36.0)
MCV: 96.7 fL (ref 78.0–100.0)
PLATELETS: 119 10*3/uL — AB (ref 150–400)
RBC: 2.72 MIL/uL — ABNORMAL LOW (ref 3.87–5.11)
RDW: 19.1 % — AB (ref 11.5–15.5)
WBC: 2 10*3/uL — AB (ref 4.0–10.5)

## 2014-08-17 LAB — COMPREHENSIVE METABOLIC PANEL
ALBUMIN: 2.7 g/dL — AB (ref 3.5–5.0)
ALT: 17 U/L (ref 14–54)
ANION GAP: 5 (ref 5–15)
AST: 29 U/L (ref 15–41)
Alkaline Phosphatase: 69 U/L (ref 38–126)
BUN: 17 mg/dL (ref 6–20)
CO2: 27 mmol/L (ref 22–32)
Calcium: 9.1 mg/dL (ref 8.9–10.3)
Chloride: 102 mmol/L (ref 101–111)
Creatinine, Ser: 1.02 mg/dL — ABNORMAL HIGH (ref 0.44–1.00)
GFR calc Af Amer: 58 mL/min — ABNORMAL LOW (ref >60)
GFR calc non Af Amer: 50 mL/min — ABNORMAL LOW (ref >60)
GLUCOSE: 99 mg/dL (ref 65–99)
Potassium: 4.1 mmol/L (ref 3.5–5.1)
Sodium: 134 mmol/L — ABNORMAL LOW (ref 135–145)
TOTAL PROTEIN: 8.3 g/dL — AB (ref 6.5–8.1)
Total Bilirubin: 0.7 mg/dL (ref 0.3–1.2)

## 2014-08-17 MED ORDER — BISACODYL 10 MG RE SUPP
10.0000 mg | Freq: Once | RECTAL | Status: AC
  Administered 2014-08-17: 10 mg via RECTAL
  Filled 2014-08-17: qty 1

## 2014-08-17 MED ORDER — SULFAMETHOXAZOLE-TRIMETHOPRIM 800-160 MG PO TABS: 1.0000 | ORAL_TABLET | Freq: Two times a day (BID) | ORAL | Status: DC

## 2014-08-17 NOTE — Progress Notes (Signed)
Patient was discharged home by MD order; discharged instructions  review and give to patient and her daughter with care notes and instructions; IV DIC; skin intact; patient will be escorted to the car by nurse tech via wheelchair.

## 2014-08-17 NOTE — Discharge Summary (Signed)
Discharge Summary  [name]:[id number] DOB: Sep 03, 1931  PCP: [name], MD  Admit date: 08/13/2014 Discharge date: 08/17/2014  Time spent: <38mins  Recommendations for Outpatient Follow-up:  1. F/u with PMD within a week for hospital discharge follow up, pmd to repeat cbc/bmp in one week. 2. F/u with hematology Dr. Alen Blew in two weeks for anemia? Multiple myeloma?  Discharge Diagnoses:  Active Hospital Problems   Diagnosis Date Noted  . UTI (lower urinary tract infection) 08/13/2014  . Sepsis 08/13/2014  . Malnutrition of moderate degree 05/22/2014  . Normocytic anemia 05/21/2014  . CAD (coronary artery disease) of artery bypass graft, atretic LIMA 02/10/2014  . COPD (chronic obstructive pulmonary disease)   . Hypertension   . Dyslipidemia 02/05/2012  . GERD (gastroesophageal reflux disease) 02/05/2012    Resolved Hospital Problems   Diagnosis Date Noted Date Resolved  No resolved problems to display.    Discharge Condition: stable  Diet recommendation: heart healthy/carb modified  Filed Weights   08/13/14 1203  Weight: 59.875 kg (132 lb)    History of present illness:  [name] is a 81 y.o. female with a past medical history of coronary artery disease, emphysema, chronic kidney disease stage III, hypertension, chronic anemia who lives at home with her daughter. She was in her usual state of health until earlier today when she was going to the bathroom, felt dizzy and fell. She denies any injuries. She did not pass out. She had an episode of urinary incontinence at that time. She's had a fever. She denies any nausea, vomiting. She's had a few loose stools. No blood in the stool. She does admit to burning sensation with urination and frequent urination of small quantity. She's also felt a little short of breath at times. She denies any history of frequent falls. She usually independent with daily activities.  Hospital Course:  Principal Problem:  UTI (lower urinary tract infection) Active Problems:   Dyslipidemia   GERD (gastroesophageal reflux disease)   COPD (chronic obstructive pulmonary disease)   Hypertension   CAD (coronary artery disease) of artery bypass graft, atretic LIMA   Normocytic anemia   Malnutrition of moderate degree   Sepsis  UTI (lower urinary tract infection) with sepsis:  Improving, Urine culure MDR kleb pneumo, resistance to rocefin, abx changed to imipenem. Improved, fever resolved. She is discharged on bactrium Ds BID for 5 days. pmd to repeat bmp in one week.   Active Problems:  Mechanical fall: Suspect from weakness-due to above. CT head negative for acute abnormalities. Nonfocal exam. Orthostatics negative on admission. PT evaluation completed-no further recommendations.   Anemia:  Suspect worsening anemia from IV fluid and acute illness. No evidence of blood loss. Has history of chronic anemia-being worked up as an outpatient for possible multiple myeloma-bone marrow biopsy being contemplated in the near future.  Since hemoglobin down to 6.8 on 08/14/14 given one unit of blood. Hemoglobin stable at discharge.  Will defer further evaluation to the outpatient setting when she has recovered from acute illness.    Dyslipidemia: Continue Zetia   GERD (gastroesophageal reflux disease): Continue PPI   COPD (chronic obstructive pulmonary disease): Lungs clear-appears stable-continue Spiriva, continue as needed bronchodilators. Chest XRAY neg for acute findings.    Hypertension: Resume HCTZ. Follow    Mild thrombocytopenia: Follow-watch closely while on Lovenox prophylactic   CAD (coronary artery disease) of artery bypass graft, atretic LIMA: Without any chest pain-continue aspirin   Chronic disease stage III: Follow closely. Creatinine close to  baseline.   Malnutrition of severe degree: Nutrition evaluation. Ensure Enlive recommended by dietician. Follow.  Procedures:  prbc x1  units  Consultations:  none  Discharge Exam: BP 156/46 mmHg  Pulse 61  Temp(Src) 98.2 F (36.8 C) (Oral)  Resp 19  Ht _0  (1.575 m)  Wt 59.875 kg (132 lb)  BMI 24.14 kg/m2  SpO2 95%  General: AAOx3 Cardiovascular: RRR Respiratory: CTABL  Discharge Instructions You were cared for by a hospitalist during your hospital stay. If you have any questions about your discharge medications or the care you received while you were in the hospital after you are discharged, you can call the unit and asked to speak with the hospitalist on call if the hospitalist that took care of you is not available. Once you are discharged, your primary care physician will handle any further medical issues. Please note that NO REFILLS for any discharge medications will be authorized once you are discharged, as it is imperative that you return to your primary care physician (or establish a relationship with a primary care physician if you do not have one) for your aftercare needs so that they can reassess your need for medications and monitor your lab values.  Discharge Instructions    Diet - low sodium heart healthy    Complete by:  As directed      Increase activity slowly    Complete by:  As directed             Medication List    TAKE these medications        albuterol-ipratropium 18-103 MCG/ACT inhaler  Commonly known as:  COMBIVENT  Inhale 1-2 puffs into the lungs every 4 (four) hours as needed for wheezing or shortness of breath.     aspirin 325 MG EC tablet  Take 325 mg by mouth daily.     cholecalciferol 1000 UNITS tablet  Commonly known as:  VITAMIN D  Take 1,000 Units by mouth daily.     ezetimibe 10 MG tablet  Commonly known as:  ZETIA  Take 1 tablet (10 mg total) by mouth at bedtime.     Fish Oil 1200 MG Caps  Take 1,200-2,400 mg by mouth 2 (two) times daily. Take 2 capsules (2400 mg) every morning and 1 capsule (1200 mg) every night     hydrochlorothiazide 25 MG tablet    Commonly known as:  HYDRODIURIL  Take 25 mg by mouth daily.     nitroGLYCERIN 0.4 MG SL tablet  Commonly known as:  NITROSTAT  Place 0.4 mg under the tongue every 5 (five) minutes as needed for chest pain.     pantoprazole 40 MG tablet  Commonly known as:  PROTONIX  Take 1 tablet (40 mg total) by mouth 2 (two) times daily.     polyethylene glycol powder powder  Commonly known as:  GLYCOLAX/MIRALAX  Take 2 Containers by mouth at bedtime. Mix with 8 oz liquid and drink     prochlorperazine 10 MG tablet  Commonly known as:  COMPAZINE  Take 10 mg by mouth 3 (three) times daily. As needed for nausea/vomiting     RA SENNA 8.6 MG tablet  Generic drug:  senna  Take 1 tablet by mouth daily as needed for constipation.     sulfamethoxazole-trimethoprim 800-160 MG per tablet  Commonly known as:  BACTRIM DS  Take 1 tablet by mouth 2 (two) times daily.     tiotropium 18 MCG inhalation capsule  Commonly known as:  SPIRIVA  Place 18 mcg into inhaler and inhale daily as needed (shortness of breath).     zolpidem 5 MG tablet  Commonly known as:  AMBIEN  Take 5 mg by mouth at bedtime as needed for sleep.       Allergies  Allergen Reactions  . Lexapro [Escitalopram Oxalate] Other (See Comments)    Fatigue   . Soma [Carisoprodol] Other (See Comments)    Fatigue   . Wellbutrin [Bupropion] Other (See Comments)    "couldn't function and take it"  . Albuterol Sulfate Other (See Comments)    Albuterol HFA inhaler caused nervousness   . Codeine Hives  . Levaquin [Levofloxacin In D5w] Other (See Comments)    Unknown allergic reaction  . Plavix [Clopidogrel Bisulfate] Other (See Comments)    Unknown reaction  . Statins        Follow-up Information    Follow up with [name], MD In 1 week.   Specialty:  Internal Medicine   Why:  hospital discharge follow up   Contact information:   [address] [place] [address] [phone]       Follow up with  [facility], MD In 2 weeks.   Specialty:  Oncology   Why:  anemia   Contact information:   [address] [address] [phone]        The results of significant diagnostics from this hospitalization (including imaging, microbiology, ancillary and laboratory) are listed below for reference.    Significant Diagnostic Studies: Ct Head Wo Contrast  08/13/2014   CLINICAL DATA:  Fall while standing, weakness  EXAM: CT HEAD WITHOUT CONTRAST  TECHNIQUE: Contiguous axial images were obtained from the base of the skull through the vertex without intravenous contrast.  COMPARISON:  None.  FINDINGS: Bony calvarium is intact. No gross soft tissue abnormality is noted. Paranasal sinuses and mastoid air cells are well aerated. No findings to suggest acute hemorrhage, acute infarction or space-occupying mass lesion are noted. Very mild areas of prior ischemic change are seen. No acute abnormality is noted.  IMPRESSION: Mild chronic white matter ischemic changes noted. No acute abnormality is noted.   Electronically Signed   By: Inez Catalina M.D.   On: 08/13/2014 14:59   Dg Chest Port 1 View  08/15/2014   CLINICAL DATA:  Shortness of breath this morning.  EXAM: PORTABLE CHEST - 1 VIEW  COMPARISON:  08/13/2014  FINDINGS: Sternotomy wires unchanged. Lungs are adequately inflated without focal consolidation or effusion. Mild stable blunting of the left costophrenic angle. Stable borderline cardiomegaly. Remainder of the exam is unchanged.  IMPRESSION: No active disease.   Electronically Signed   By: Marin Olp M.D.   On: 08/15/2014 08:20   Dg Chest Port 1 View  08/13/2014   CLINICAL DATA:  Per ED note: Per [facility], pt is from home, got up this morning to go to the bathroom and fell from a standing position Forward to her knees and hands due to weakness. Pt denies injury, no neck or back pain. Pt mildly febrile 100.3, and difficulty urinating for several weeks. Pt also has hx of enemia. Pt has  had two or three transfusions. Scheduled for one Friday and they called her to say her hemoglobin was up and she didn't need it. CBG 117. AAOX4, c/o pain below her belly button. 67 in R handH/O CABG X 2 in 2005, COPD, HTN, cardiac cath in 2015.  EXAM: PORTABLE CHEST - 1 VIEW  COMPARISON:  06/22/2014  FINDINGS: Cardiac silhouette is mildly enlarged. Changes from CABG surgery are stable. No mediastinal or hilar masses.  No lung consolidation or edema. No pleural effusion or pneumothorax.  Bony thorax is demineralized but grossly intact.  There are carotid calcifications in the neck.  IMPRESSION: No acute cardiopulmonary disease.   Electronically Signed   By: Lajean Manes M.D.   On: 08/13/2014 12:53   Dg Bone Survey Met  07/20/2014   CLINICAL DATA:  Plasma cell disorder question myeloma lesions, upper back pain for couple weeks, COPD, hypertension, heart murmur  EXAM: METASTATIC BONE SURVEY  COMPARISON:  Chest radiograph 06/22/2014  FINDINGS: No focal calvarial abnormalities.  Diffuse degenerative disc and facet disease changes throughout the cervical, thoracic and lumbar spine without focal spinal abnormality.  Cardiac silhouette enlargement postsurgical with changes of CABG.  Extensive atherosclerotic calcifications aorta and BILATERAL carotid arteries greater on LEFT.  Suspected bone island at the RIGHT femoral head.  No lytic or destructive bone lesions identified to suggest myeloma involvement.  IMPRESSION: No radiographic evidence of myelomatous involvement.  Scattered degenerative changes of the cervical, thoracic, and lumbar spine.  Extensive atherosclerotic calcifications of the aorta, iliac arteries and BILATERAL carotid arteries.   Electronically Signed   By: Lavonia Dana M.D.   On: 07/20/2014 16:22    Microbiology: Recent Results (from the past 240 hour(s))  Blood culture (routine x 2)     Status: None (Preliminary result)   Collection Time: 08/13/14 12:20 PM  Result Value Ref Range Status    Specimen Description BLOOD RIGHT ARM  Final   Special Requests BOTTLES DRAWN AEROBIC AND ANAEROBIC B 10CC R 5CC  Final   Culture   Final           BLOOD CULTURE RECEIVED NO GROWTH TO DATE CULTURE WILL BE HELD FOR 5 DAYS BEFORE ISSUING A FINAL NEGATIVE REPORT Performed at [facility]    Report Status PENDING  Incomplete  Blood culture (routine x 2)     Status: None (Preliminary result)   Collection Time: 08/13/14 12:30 PM  Result Value Ref Range Status   Specimen Description BLOOD RIGHT HAND  Final   Special Requests BOTTLES DRAWN AEROBIC ONLY 3CC  Final   Culture   Final           BLOOD CULTURE RECEIVED NO GROWTH TO DATE CULTURE WILL BE HELD FOR 5 DAYS BEFORE ISSUING A FINAL NEGATIVE REPORT Performed at [facility]    Report Status PENDING  Incomplete  Urine culture     Status: None   Collection Time: 08/13/14  1:23 PM  Result Value Ref Range Status   Specimen Description URINE, CATHETERIZED  Final   Special Requests NONE  Final   Colony Count   Final    >=100,000 COLONIES/ML Performed at [facility]    Culture   Final    KLEBSIELLA PNEUMONIAE Note: Confirmed Extended Spectrum Beta-Lactamase Producer (ESBL) CRITICAL RESULT CALLED TO, READ BACK BY AND VERIFIED WITH: CINDY 5/10 AT 1442 BY DUNNJ Performed at [facility]    Report Status 08/15/2014 FINAL  Final   Organism ID, Bacteria KLEBSIELLA PNEUMONIAE  Final      Susceptibility   Klebsiella pneumoniae - MIC*    AMPICILLIN >=32 RESISTANT Resistant     CEFAZOLIN >=64 RESISTANT Resistant     CEFTRIAXONE >=64 RESISTANT Resistant     CIPROFLOXACIN INTERMEDIATE      GENTAMICIN <=1 SENSITIVE Sensitive  LEVOFLOXACIN >=8 RESISTANT Resistant     NITROFURANTOIN 32 SENSITIVE Sensitive     TOBRAMYCIN <=1 SENSITIVE Sensitive     TRIMETH/SULFA <=20 SENSITIVE Sensitive     IMIPENEM <=0.25 SENSITIVE Sensitive     PIP/TAZO 8 SENSITIVE Sensitive     * KLEBSIELLA PNEUMONIAE     Labs: Basic  Metabolic Panel:  Recent Labs Lab 08/13/14 1216 08/13/14 1243 08/14/14 0458 08/15/14 0530 08/16/14 0554 08/17/14 0533  NA 133* 140 135 136 133* 134*  K 3.7 3.9 3.6 3.6 4.7 4.1  CL 99* 99* 105 100* 99* 102  CO2 25  --  _0 GLUCOSE 152* 156* 98 101* 101* 99  BUN 21* 25* 23* 24* 23* 17  CREATININE 1.41* 1.40* 1.10* 1.41* 1.15* 1.02*  CALCIUM 9.5  --  8.2* 8.9 8.7* 9.1   Liver Function Tests:  Recent Labs Lab 08/13/14 1216 08/14/14 0458 08/17/14 0533  AST 47* 25 29  ALT _1 ALKPHOS 69 50 69  BILITOT 0.9 0.6 0.7  PROT 10.3* 7.5 8.3*  ALBUMIN 3.5 2.5* 2.7*    Recent Labs Lab 08/13/14 1216  LIPASE 21*   No results for input(s): AMMONIA in the last 168 hours. CBC:  Recent Labs Lab 08/13/14 1216 08/13/14 1243 08/14/14 0458 08/14/14 1607 08/15/14 0530 08/17/14 0533  WBC 5.5  --  6.6  --  7.9 2.0*  HGB 8.6* 10.5* 6.8* 8.5* 9.3* 8.6*  HCT 26.0* 31.0* 21.0* 25.6* 27.9* 26.3*  MCV 98.9  --  100.0  --  94.9 96.7  PLT 132*  --  106*  --  127* 119*   Cardiac Enzymes: No results for input(s): CKTOTAL, CKMB, CKMBINDEX, TROPONINI in the last 168 hours. BNP: BNP (last 3 results)  Recent Labs  05/20/14 1709 05/23/14 1445 08/13/14 1216  BNP 144.9* 537.1* 150.0*    ProBNP (last 3 results)  Recent Labs  02/08/14 1727  PROBNP 121.3    CBG:  Recent Labs Lab 08/13/14 1255  GLUCAP 117*       Signed:  Bellamy Rubey MD, PhD  [facility] 08/17/2014, 11:39 AM

## 2014-08-19 LAB — CULTURE, BLOOD (ROUTINE X 2)
Culture: NO GROWTH
Culture: NO GROWTH

## 2014-08-24 ENCOUNTER — Ambulatory Visit (HOSPITAL_BASED_OUTPATIENT_CLINIC_OR_DEPARTMENT_OTHER): Payer: Medicare Other

## 2014-08-24 ENCOUNTER — Telehealth: Payer: Self-pay | Admitting: Oncology

## 2014-08-24 ENCOUNTER — Ambulatory Visit (HOSPITAL_BASED_OUTPATIENT_CLINIC_OR_DEPARTMENT_OTHER): Payer: Medicare Other | Admitting: Oncology

## 2014-08-24 VITALS — BP 131/57 | HR 77 | Temp 97.6°F | Resp 17 | Ht 62.0 in | Wt 128.7 lb

## 2014-08-24 DIAGNOSIS — D649 Anemia, unspecified: Secondary | ICD-10-CM | POA: Diagnosis not present

## 2014-08-24 DIAGNOSIS — E8809 Other disorders of plasma-protein metabolism, not elsewhere classified: Secondary | ICD-10-CM

## 2014-08-24 DIAGNOSIS — M545 Low back pain: Secondary | ICD-10-CM | POA: Diagnosis not present

## 2014-08-24 DIAGNOSIS — D638 Anemia in other chronic diseases classified elsewhere: Secondary | ICD-10-CM

## 2014-08-24 DIAGNOSIS — G8929 Other chronic pain: Secondary | ICD-10-CM | POA: Diagnosis not present

## 2014-08-24 LAB — CBC WITH DIFFERENTIAL/PLATELET
BASO%: 0.5 % (ref 0.0–2.0)
Basophils Absolute: 0 10*3/uL (ref 0.0–0.1)
EOS%: 0.9 % (ref 0.0–7.0)
Eosinophils Absolute: 0 10*3/uL (ref 0.0–0.5)
HCT: 27 % — ABNORMAL LOW (ref 34.8–46.6)
HGB: 9.2 g/dL — ABNORMAL LOW (ref 11.6–15.9)
LYMPH%: 33.9 % (ref 14.0–49.7)
MCH: 32.8 pg (ref 25.1–34.0)
MCHC: 34 g/dL (ref 31.5–36.0)
MCV: 96.4 fL (ref 79.5–101.0)
MONO#: 0.2 10*3/uL (ref 0.1–0.9)
MONO%: 5.6 % (ref 0.0–14.0)
NEUT#: 2.1 10*3/uL (ref 1.5–6.5)
NEUT%: 59.1 % (ref 38.4–76.8)
Platelets: 154 10*3/uL (ref 145–400)
RBC: 2.81 10*6/uL — AB (ref 3.70–5.45)
RDW: 19.2 % — ABNORMAL HIGH (ref 11.2–14.5)
WBC: 3.6 10*3/uL — ABNORMAL LOW (ref 3.9–10.3)
lymph#: 1.2 10*3/uL (ref 0.9–3.3)

## 2014-08-24 LAB — HOLD TUBE, BLOOD BANK

## 2014-08-24 NOTE — Telephone Encounter (Signed)
Gave patient avs report and appointments for May and June. Central radiology schedulers will call with bx appointment patient/relative aware.

## 2014-08-24 NOTE — Progress Notes (Signed)
Hematology and Oncology Follow Up Visit  [name] [id number] March 01, 1932 81 y.o. 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 She was found to have IgG level of 3430 and an M spike of 2.9 g/dL on 07/20/2014. She has no clear-cut end organ damage except for possible anemia.   Prior Therapy: Status post packed red cell transfusions during hospitalization in February 2016. This was repeated in May 2016.  Current therapy: Under evaluation for possible active multiple myeloma.  Interim History: [name] presents today for a follow-up visit. Since our last visit, she was hospitalized again between 08/13/2014 and 08/17/2014. She was found to be mildly anemic and have symptoms of fatigue and tiredness and was diagnosed with a UTI. She was treated with antibiotics and received packed red cell transfusion with some improvement in her symptoms. Upon his discharge, she did notice some mild improvement but recently continued to be very fatigued and very unsteady in her gait. Her appetite have been poor and she has been prescribed an appetite stimulant by [name]. She still have some occasional exertional dyspnea and occasional diarrhea. She does not report any new complaints. She has not reported any bone pain, pathological fracture or neuropathy.  She does not report any headaches, blurry vision, syncope or seizures. She does not report any fevers, chills, sweats. She does not report any chest pain, palpitation orthopnea. Does not report any cough, hemoptysis or hematemesis. She does report occasional nausea but no vomiting no hematochezia or melena. She does not report any frequency urgency or hesitancy. She does report chronic back pain which is unchanged. Remaining review of systems unremarkable.   Medications: I have reviewed the patient's current medications.  Current Outpatient  Prescriptions  Medication Sig Dispense Refill  . albuterol-ipratropium (COMBIVENT) 18-103 MCG/ACT inhaler Inhale 1-2 puffs into the lungs every 4 (four) hours as needed for wheezing or shortness of breath.    [name] aspirin 325 MG EC tablet Take 325 mg by mouth daily.    . cholecalciferol (VITAMIN D) 1000 UNITS tablet Take 1,000 Units by mouth daily.    [name] ezetimibe (ZETIA) 10 MG tablet Take 1 tablet (10 mg total) by mouth at bedtime.    . hydrochlorothiazide (HYDRODIURIL) 25 MG tablet Take 25 mg by mouth daily.  0  . nitroGLYCERIN (NITROSTAT) 0.4 MG SL tablet Place 0.4 mg under the tongue every 5 (five) minutes as needed for chest pain.    . Omega-3 Fatty Acids (FISH OIL) 1200 MG CAPS Take 1,200-2,400 mg by mouth 2 (two) times daily. Take 2 capsules (2400 mg) every morning and 1 capsule (1200 mg) every night    . pantoprazole (PROTONIX) 40 MG tablet Take 1 tablet (40 mg total) by mouth 2 (two) times daily.  0  . polyethylene glycol powder (GLYCOLAX/MIRALAX) powder Take 2 Containers by mouth at bedtime. Mix with 8 oz liquid and drink  0  . prochlorperazine (COMPAZINE) 10 MG tablet Take 10 mg by mouth 3 (three) times daily. As needed for nausea/vomiting  0  . RA SENNA 8.6 MG tablet Take 1 tablet by mouth daily as needed for constipation.   0  . sulfamethoxazole-trimethoprim (BACTRIM DS) 800-160 MG per tablet Take 1 tablet by mouth 2 (two) times daily. 10 tablet 0  . tiotropium (SPIRIVA) 18 MCG inhalation capsule Place 18 mcg into inhaler and inhale daily as needed (shortness of breath).     [name]  zolpidem (AMBIEN) 5 MG tablet Take 5 mg by mouth at bedtime as needed for sleep.     No current facility-administered medications for this visit.     Allergies:  Allergies  Allergen Reactions  . Lexapro [Escitalopram Oxalate] Other (See Comments)    Fatigue   . Soma [Carisoprodol] Other (See Comments)    Fatigue   . Wellbutrin [Bupropion] Other (See Comments)    "couldn't function and take it"  .  Albuterol Sulfate Other (See Comments)    Albuterol HFA inhaler caused nervousness   . Codeine Hives  . Levaquin [Levofloxacin In D5w] Other (See Comments)    Unknown allergic reaction  . Plavix [Clopidogrel Bisulfate] Other (See Comments)    Unknown reaction  . Statins     Past Medical History, Surgical history, Social history, and Family History were reviewed and updated.   Physical Exam: Blood pressure 131/57, pulse 77, temperature 97.6 F (36.4 C), temperature source Oral, resp. rate 17, height $RemoveBe'5\' 2"'QKwWofhce$  (1.575 m), weight 128 lb 11.2 oz (58.378 kg), SpO2 98 %. ECOG: 1 General appearance: alert and cooperative. Pale and chronically ill-appearing. Head: Normocephalic, without obvious abnormality Neck: no adenopathy Lymph nodes: Cervical, supraclavicular, and axillary nodes normal. Heart:regular rate and rhythm, S1, S2 normal, no murmur, click, rub or gallop Lung:chest clear, no wheezing, rales, normal symmetric air entry Abdomin: soft, non-tender, without masses or organomegaly EXT:no erythema, induration, or nodules   Lab Results: Lab Results  Component Value Date   WBC 2.0* 08/17/2014   HGB 8.6* 08/17/2014   HCT 26.3* 08/17/2014   MCV 96.7 08/17/2014   PLT 119* 08/17/2014     Chemistry      Component Value Date/Time   NA 134* 08/17/2014 0533   NA 138 07/20/2014 1429   K 4.1 08/17/2014 0533   K 3.9 07/20/2014 1429   CL 102 08/17/2014 0533   CO2 27 08/17/2014 0533   CO2 27 07/20/2014 1429   BUN 17 08/17/2014 0533   BUN 20.8 07/20/2014 1429   CREATININE 1.02* 08/17/2014 0533   CREATININE 1.2* 07/20/2014 1429      Component Value Date/Time   CALCIUM 9.1 08/17/2014 0533   CALCIUM 9.4 07/20/2014 1429   ALKPHOS 69 08/17/2014 0533   ALKPHOS 60 07/20/2014 1429   AST 29 08/17/2014 0533   AST 22 07/20/2014 1429   ALT 17 08/17/2014 0533   ALT 13 07/20/2014 1429   BILITOT 0.7 08/17/2014 0533   BILITOT 0.62 07/20/2014 1429         Impression and  Plan:  81 year old woman with the following issues:  1. IgG kappa plasma cell disorder is possible smoldering myeloma versus active multiple myeloma. She has an M spike that is close to 3 g/dL but no clear-cut end organ damage with normal skeletal survey. She does have anemia which certainly could be an indication of active multiple myeloma. Her bone marrow biopsy was scheduled but was not done because of her recent hospitalization.  I will reschedule her bone marrow biopsy to be done as soon as possible to evaluate her for plasma cell disorder. If she does have active myeloma, she will need treatment as soon as possible to palliate her symptoms.  2. Anemia: Could be related to plasma cell disorder could also be related to anemia of chronic disease or myelodysplasia. Bone marrow biopsy would help in determining the etiology. She is symptomatic today and I will check her counts and transfuse if needed to.  She will follow-up soon after  her bone marrow biopsy to discuss the next step.   [facility], MD 5/19/20168:44 AM

## 2014-08-25 ENCOUNTER — Telehealth: Payer: Self-pay | Admitting: *Deleted

## 2014-08-25 NOTE — Telephone Encounter (Signed)
This RN spoke with patient and informed her that she doesn't need a blood transfusion on Saturday, 08/26/14. Hemoglobin is 9.2. Patient verbalized understanding.

## 2014-08-29 ENCOUNTER — Other Ambulatory Visit: Payer: Self-pay | Admitting: Radiology

## 2014-08-30 ENCOUNTER — Ambulatory Visit (HOSPITAL_COMMUNITY)
Admission: RE | Admit: 2014-08-30 | Discharge: 2014-08-30 | Disposition: A | Discharge status: Home / Self Care | Payer: Medicare Other | Source: Ambulatory Visit | Attending: Oncology | Admitting: Oncology

## 2014-08-30 ENCOUNTER — Encounter (HOSPITAL_COMMUNITY): Payer: Self-pay

## 2014-08-30 DIAGNOSIS — Z951 Presence of aortocoronary bypass graft: Secondary | ICD-10-CM | POA: Diagnosis not present

## 2014-08-30 DIAGNOSIS — I10 Essential (primary) hypertension: Secondary | ICD-10-CM | POA: Diagnosis not present

## 2014-08-30 DIAGNOSIS — D638 Anemia in other chronic diseases classified elsewhere: Secondary | ICD-10-CM | POA: Diagnosis present

## 2014-08-30 DIAGNOSIS — I252 Old myocardial infarction: Secondary | ICD-10-CM | POA: Diagnosis not present

## 2014-08-30 DIAGNOSIS — I2510 Atherosclerotic heart disease of native coronary artery without angina pectoris: Secondary | ICD-10-CM | POA: Insufficient documentation

## 2014-08-30 DIAGNOSIS — Z87891 Personal history of nicotine dependence: Secondary | ICD-10-CM | POA: Diagnosis not present

## 2014-08-30 LAB — CBC
HCT: 26.2 % — ABNORMAL LOW (ref 36.0–46.0)
Hemoglobin: 8.4 g/dL — ABNORMAL LOW (ref 12.0–15.0)
MCH: 31.7 pg (ref 26.0–34.0)
MCHC: 32.1 g/dL (ref 30.0–36.0)
MCV: 98.9 fL (ref 78.0–100.0)
PLATELETS: 120 10*3/uL — AB (ref 150–400)
RBC: 2.65 MIL/uL — AB (ref 3.87–5.11)
RDW: 19 % — ABNORMAL HIGH (ref 11.5–15.5)
WBC: 2.7 10*3/uL — ABNORMAL LOW (ref 4.0–10.5)

## 2014-08-30 LAB — BONE MARROW EXAM

## 2014-08-30 LAB — PROTIME-INR
INR: 1.1 (ref 0.00–1.49)
PROTHROMBIN TIME: 14.4 s (ref 11.6–15.2)

## 2014-08-30 LAB — APTT: aPTT: 25 seconds (ref 24–37)

## 2014-08-30 MED ORDER — SODIUM CHLORIDE 0.9 % IV SOLN
Freq: Once | INTRAVENOUS | Status: AC
  Administered 2014-08-30: 07:00:00 via INTRAVENOUS

## 2014-08-30 MED ORDER — FENTANYL CITRATE (PF) 100 MCG/2ML IJ SOLN
INTRAMUSCULAR | Status: AC | PRN
  Administered 2014-08-30 (×2): 12.5 ug via INTRAVENOUS

## 2014-08-30 MED ORDER — MIDAZOLAM HCL 2 MG/2ML IJ SOLN
INTRAMUSCULAR | Status: AC | PRN
  Administered 2014-08-30: 0.5 mg via INTRAVENOUS

## 2014-08-30 MED ORDER — MIDAZOLAM HCL 2 MG/2ML IJ SOLN
INTRAMUSCULAR | Status: AC
  Filled 2014-08-30: qty 4

## 2014-08-30 MED ORDER — FENTANYL CITRATE (PF) 100 MCG/2ML IJ SOLN
INTRAMUSCULAR | Status: AC
  Filled 2014-08-30: qty 2

## 2014-08-30 NOTE — Discharge Instructions (Signed)
° °  Bone Biopsy, Needle, Care After Read the instructions outlined below and refer to this sheet in the next few weeks. These discharge instructions provide you with general information on caring for yourself after you leave the hospital. Your caregiver may also give you specific instructions. While your treatment has been planned according to the most current medical practices available, unavoidable complications sometimes occur. If you have any problems or questions after discharge, call your caregiver. Finding out the results of your test Not all test results are available during your visit. If your test results are not back during the visit, make an appointment with your caregiver to find out the results. Do not assume everything is normal if you have not heard from your caregiver or the medical facility. It is important for you to follow up on all of your test results.  SEEK MEDICAL CARE IF:   You have redness, swelling, or increasing pain at the site of the biopsy.  You have pus coming from the biopsy site.  You have drainage from the biopsy site lasting longer than 1 day.  You notice a bad smell coming from the biopsy site or dressing.  You develop persistent nausea or vomiting. SEEK IMMEDIATE MEDICAL CARE IF:  You have a fever.  You develop a rash.  You have difficulty breathing.  You develop any reaction or side effects to medicines given. Document Released: 10/11/2004 Document Revised: 01/12/2013 Document Reviewed: 08/29/2008 [facility] Patient Information 2015 [place], [place]. This information is not intended to replace advice given to you by your health care provider. Make sure you discuss any questions you have with your health care provider.  General Anesthesia, Care After Refer to this sheet in the next few weeks. These instructions provide you with information on caring for yourself after your procedure. Your health care provider may also give you more specific instructions.  Your treatment has been planned according to current medical practices, but problems sometimes occur. Call your health care provider if you have any problems or questions after your procedure. WHAT TO EXPECT AFTER THE PROCEDURE After the procedure, it is typical to experience:  Sleepiness.  Nausea and vomiting. HOME CARE INSTRUCTIONS  For the first 24 hours after general anesthesia:  Have a responsible person with you.  Do not drive a car. If you are alone, do not take public transportation.  Do not drink alcohol.  Do not take medicine that has not been prescribed by your health care provider.  Do not sign important papers or make important decisions.  You may resume a normal diet and activities as directed by your health care provider.  Change bandages (dressings) as directed.  If you have questions or problems that seem related to general anesthesia, call the hospital and ask for the anesthetist or anesthesiologist on call. SEEK MEDICAL CARE IF:  You have nausea and vomiting that continue the day after anesthesia.  You develop a rash. SEEK IMMEDIATE MEDICAL CARE IF:   You have difficulty breathing.  You have chest pain.  You have any allergic problems. Document Released: 06/30/2000 Document Revised: 03/29/2013 Document Reviewed: 10/07/2012 [facility] Patient Information 2015 [place], [place]. This information is not intended to replace advice given to you by your health care provider. Make sure you discuss any questions you have with your health care provider.

## 2014-08-30 NOTE — H&P (Signed)
Chief Complaint: "I'm having a bone marrow biopsy"  Referring Physician(s): Shadad,Firas N  History of Present Illness: [name] is a 80 y.o. female with history of anemia and IgG plasma cell disorder who presents today for CT-guided bone marrow biopsy for further evaluation.  Past Medical History  Diagnosis Date  . COPD (chronic obstructive pulmonary disease)   . Gout   . Coronary atherosclerosis of native coronary artery 02/05/2012  . Hypertension   . High blood cholesterol   . Heart murmur     "all my life" (02/05/2012)  . Anginal pain   . Myocardial infarction 1990's; 2000's    "2 total, I think" (02/05/2012)  . Pneumonia     "several times" (02/05/2012)  . Chronic bronchitis   . Shortness of breath     "sometimes when I'm laying down; always when I do too much" (02/05/2012)  . History of blood transfusion   . External bleeding hemorrhoids     "act up at times" (02/05/2012)  . Chronic back pain     "all over" (02/05/2012)  . Depression   . CAD (coronary artery disease) of artery bypass graft 02/09/14    atretic LIMA    Past Surgical History  Procedure Laterality Date  . Appendectomy  ? 1970's  . Ectopic pregnancy surgery  1970's  . Abdominal hysterectomy  ? 1970's    "partial 1st time; complete 2nd" (02/05/2012)  . Cholecystectomy  ~ 2010  . Cataract extraction w/ intraocular lens  implant, bilateral  [phone]  . Coronary angioplasty with stent placement  2005  . Coronary artery bypass graft  2005    CABG X2  . Cardiac catheterization  02/09/14    atretic LIMA,  patent VG-dRCA and Total occlusion of the native RCA proximally prior to remotely placement RCA stent, with mild proximal 30% LAD stenosis and 30% distal circumflex stenoses.  EF 55%.  . Left heart catheterization with coronary/graft angiogram N/A 02/09/2014    Procedure: LEFT HEART CATHETERIZATION WITH Isabel Caprice;  Surgeon: Lennette Bihari, MD;  Location: [facility] CATH LAB;   Service: Cardiovascular;  Laterality: N/A;  . Esophagogastroduodenoscopy (egd) with propofol Left 05/22/2014    Procedure: ESOPHAGOGASTRODUODENOSCOPY (EGD) WITH PROPOFOL;  Surgeon: Willis Modena, MD;  Location: [facility] ENDOSCOPY;  Service: Endoscopy;  Laterality: Left;    Allergies: Lexapro; Soma; Wellbutrin; Albuterol sulfate; Codeine; Levaquin; Plavix; and Statins  Medications: Prior to Admission medications   Medication Sig Start Date End Date Taking? Authorizing Provider  aspirin 325 MG EC tablet Take 325 mg by mouth every morning.    Yes Historical Provider, MD  cholecalciferol (VITAMIN D) 1000 UNITS tablet Take 1,000 Units by mouth every morning.    Yes Historical Provider, MD  ezetimibe (ZETIA) 10 MG tablet Take 1 tablet (10 mg total) by mouth at bedtime. 05/25/14  Yes Kathlen Mody, MD  hydrochlorothiazide (HYDRODIURIL) 25 MG tablet Take 25 mg by mouth every morning.  05/16/14  Yes Historical Provider, MD  Omega-3 Fatty Acids (FISH OIL) 1200 MG CAPS Take 1,200-2,400 mg by mouth 2 (two) times daily. Take 2 capsules (2400 mg) every morning and 1 capsule (1200 mg) every night   Yes Historical Provider, MD  pantoprazole (PROTONIX) 40 MG tablet Take 1 tablet (40 mg total) by mouth 2 (two) times daily. 05/25/14  Yes Kathlen Mody, MD  prochlorperazine (COMPAZINE) 10 MG tablet Take 10 mg by mouth 3 (three) times daily. As needed for nausea/vomiting 06/29/14  Yes Historical Provider, MD  RA SENNA  8.6 MG tablet Take 1 tablet by mouth daily as needed for constipation.  02/07/14  Yes Historical Provider, MD  tiotropium (SPIRIVA) 18 MCG inhalation capsule Place 18 mcg into inhaler and inhale daily as needed (shortness of breath).    Yes Historical Provider, MD  zolpidem (AMBIEN) 5 MG tablet Take 5 mg by mouth at bedtime as needed for sleep.   Yes Historical Provider, MD  albuterol-ipratropium (COMBIVENT) 18-103 MCG/ACT inhaler Inhale 1-2 puffs into the lungs every 4 (four) hours as needed for wheezing or  shortness of breath.    Historical Provider, MD  nitroGLYCERIN (NITROSTAT) 0.4 MG SL tablet Place 0.4 mg under the tongue every 5 (five) minutes as needed for chest pain.    Historical Provider, MD  polyethylene glycol powder (GLYCOLAX/MIRALAX) powder Take 2 Containers by mouth at bedtime. Mix with 8 oz liquid and drink 05/16/14   Historical Provider, MD  sulfamethoxazole-trimethoprim (BACTRIM DS) 800-160 MG per tablet Take 1 tablet by mouth 2 (two) times daily. 08/17/14   Florencia Reasons, MD    Family History  Problem Relation Age of Onset  . Anemia Neg Hx   . Arrhythmia Neg Hx   . Asthma Neg Hx   . Clotting disorder Neg Hx   . Fainting Neg Hx   . Heart attack Neg Hx   . Heart disease Neg Hx   . Heart failure Neg Hx   . Hyperlipidemia Neg Hx   . Hypertension Neg Hx   . Aneurysm    . CVA    . CVA Mother   . Aneurysm Father     History   Social History  . Marital Status: Widowed    Spouse Name: N/A  . Number of Children: N/A  . Years of Education: N/A   Social History Main Topics  . Smoking status: Former Smoker -- 1.00 packs/day for 50 years    Types: Cigarettes    Quit date: 04/02/2011  . Smokeless tobacco: Never Used  . Alcohol Use: Yes     Comment: says she drinks wine once in a while  . Drug Use: No  . Sexual Activity: No     Comment: husband deceased since 69 hasn't had intercourse since then   Other Topics Concern  . None   Social History Narrative      Review of Systems  Constitutional: Positive for appetite change and fatigue. Negative for fever and chills.  Respiratory: Positive for shortness of breath.        Occ cough  Cardiovascular: Negative for chest pain.  Gastrointestinal: Negative for nausea, vomiting, abdominal pain and blood in stool.  Genitourinary: Negative for dysuria and hematuria.  Musculoskeletal: Positive for back pain.  Neurological: Negative for headaches.    Vital Signs: BP 155/62 mmHg  Pulse 58  Temp(Src) 97.6 F (36.4 C) (Oral)   Resp 18  SpO2 94%  Physical Exam  Constitutional: She is oriented to person, place, and time. She appears well-developed and well-nourished.  Cardiovascular: Regular rhythm.   Slightly bradycardic  Pulmonary/Chest: Effort normal.  Few fine bibasilar crackles  Abdominal: Soft. Bowel sounds are normal. There is no tenderness.  Musculoskeletal: Normal range of motion. She exhibits no edema.  Neurological: She is alert and oriented to person, place, and time.    Imaging: Ct Head Wo Contrast  08/13/2014   CLINICAL DATA:  Fall while standing, weakness  EXAM: CT HEAD WITHOUT CONTRAST  TECHNIQUE: Contiguous axial images were obtained from the base of the skull through the  vertex without intravenous contrast.  COMPARISON:  None.  FINDINGS: Bony calvarium is intact. No gross soft tissue abnormality is noted. Paranasal sinuses and mastoid air cells are well aerated. No findings to suggest acute hemorrhage, acute infarction or space-occupying mass lesion are noted. Very mild areas of prior ischemic change are seen. No acute abnormality is noted.  IMPRESSION: Mild chronic white matter ischemic changes noted. No acute abnormality is noted.   Electronically Signed   By: Inez Catalina M.D.   On: 08/13/2014 14:59   Dg Chest Port 1 View  08/15/2014   CLINICAL DATA:  Shortness of breath this morning.  EXAM: PORTABLE CHEST - 1 VIEW  COMPARISON:  08/13/2014  FINDINGS: Sternotomy wires unchanged. Lungs are adequately inflated without focal consolidation or effusion. Mild stable blunting of the left costophrenic angle. Stable borderline cardiomegaly. Remainder of the exam is unchanged.  IMPRESSION: No active disease.   Electronically Signed   By: Marin Olp M.D.   On: 08/15/2014 08:20   Dg Chest Port 1 View  08/13/2014   CLINICAL DATA:  Per ED note: Per [facility], pt is from home, got up this morning to go to the bathroom and fell from a standing position Forward to her knees and hands due to weakness. Pt denies  injury, no neck or back pain. Pt mildly febrile 100.3, and difficulty urinating for several weeks. Pt also has hx of enemia. Pt has had two or three transfusions. Scheduled for one Friday and they called her to say her hemoglobin was up and she didn't need it. CBG 117. AAOX4, c/o pain below her belly button. 55 in R handH/O CABG X 2 in 2005, COPD, HTN, cardiac cath in 2015.  EXAM: PORTABLE CHEST - 1 VIEW  COMPARISON:  06/22/2014  FINDINGS: Cardiac silhouette is mildly enlarged. Changes from CABG surgery are stable. No mediastinal or hilar masses.  No lung consolidation or edema. No pleural effusion or pneumothorax.  Bony thorax is demineralized but grossly intact.  There are carotid calcifications in the neck.  IMPRESSION: No acute cardiopulmonary disease.   Electronically Signed   By: Lajean Manes M.D.   On: 08/13/2014 12:53    Labs:  CBC:  Recent Labs  08/15/14 0530 08/17/14 0533 08/24/14 0910 08/30/14 0728  WBC 7.9 2.0* 3.6* 2.7*  HGB 9.3* 8.6* 9.2* 8.4*  HCT 27.9* 26.3* 27.0* 26.2*  PLT 127* 119* 154 120*    COAGS:  Recent Labs  02/08/14 1727 05/20/14 1711 05/21/14 0920 08/13/14 1646 08/30/14 0728  INR 1.08 1.07 1.18 1.35 1.10  APTT 26  --   --  31 25    BMP:  Recent Labs  08/14/14 0458 08/15/14 0530 08/16/14 0554 08/17/14 0533  NA 135 136 133* 134*  K 3.6 3.6 4.7 4.1  CL 105 100* 99* 102  CO2 $Re'25 28 25 27  'XsH$ GLUCOSE 98 101* 101* 99  BUN 23* 24* 23* 17  CALCIUM 8.2* 8.9 8.7* 9.1  CREATININE 1.10* 1.41* 1.15* 1.02*  GFRNONAA 46* 34* 43* 50*  GFRAA 53* 39* 50* 58*    LIVER FUNCTION TESTS:  Recent Labs  07/20/14 1429 08/13/14 1216 08/14/14 0458 08/17/14 0533  BILITOT 0.62 0.9 0.6 0.7  AST 22 47* 25 29  ALT $Re'13 25 17 17  'WJk$ ALKPHOS 60 69 50 69  PROT 9.5* 10.3* 7.5 8.3*  ALBUMIN 3.6 3.5 2.5* 2.7*    TUMOR MARKERS: No results for input(s): AFPTM, CEA, CA199, CHROMGRNA in the last 8760 hours.  Assessment and  Plan: [name] is a 81 y.o. female  with history of anemia and IgG plasma cell disorder who presents today for CT-guided bone marrow biopsy for further evaluation.Risks and benefits discussed with the patient/daughter including, but not limited to bleeding, infection, damage to adjacent structures or low yield requiring additional tests. All of the patient's questions were answered, patient is agreeable to proceed. Consent signed and in chart.     Signed: D. Rowe Robert 08/30/2014, 8:36 AM   I spent a total of 20 minutes face to face in clinical consultation, greater than 50% of which was counseling/coordinating care for CT-guided bone marrow biopsy

## 2014-08-30 NOTE — Procedures (Signed)
Successful RT ILIAC BM ASP AND CORE BX NO COMP STABLE PATH PENDING FULL REPORT IN PACS

## 2014-09-07 ENCOUNTER — Ambulatory Visit: Payer: Medicare Other

## 2014-09-07 ENCOUNTER — Encounter: Payer: Self-pay | Admitting: Oncology

## 2014-09-07 ENCOUNTER — Telehealth: Payer: Self-pay | Admitting: Oncology

## 2014-09-07 ENCOUNTER — Ambulatory Visit (HOSPITAL_BASED_OUTPATIENT_CLINIC_OR_DEPARTMENT_OTHER): Payer: Medicare Other | Admitting: Oncology

## 2014-09-07 VITALS — BP 131/48 | HR 63 | Resp 18 | Ht 62.0 in | Wt 125.1 lb

## 2014-09-07 DIAGNOSIS — M545 Low back pain: Secondary | ICD-10-CM | POA: Diagnosis not present

## 2014-09-07 DIAGNOSIS — C9000 Multiple myeloma not having achieved remission: Secondary | ICD-10-CM | POA: Diagnosis not present

## 2014-09-07 DIAGNOSIS — G8929 Other chronic pain: Secondary | ICD-10-CM

## 2014-09-07 DIAGNOSIS — R634 Abnormal weight loss: Secondary | ICD-10-CM

## 2014-09-07 DIAGNOSIS — D630 Anemia in neoplastic disease: Secondary | ICD-10-CM | POA: Diagnosis not present

## 2014-09-07 HISTORY — DX: Multiple myeloma not having achieved remission: C90.00

## 2014-09-07 MED ORDER — MEGESTROL ACETATE 400 MG/10ML PO SUSP: 400.0000 mg | Freq: Two times a day (BID) | ORAL | Status: DC

## 2014-09-07 MED ORDER — DEXAMETHASONE 4 MG PO TABS: ORAL_TABLET | ORAL | Status: DC

## 2014-09-07 NOTE — Progress Notes (Signed)
Hematology and Oncology Follow Up Visit  [name] [id number] 11/12/1931 81 y.o. 09/07/2014 12:33 PM [name], MDHusain, Denton Ar, MD   Principle Diagnosis: 81 year old woman with a plasma cell disorder in the form of IgG kappa. She was found to have IgG level of 3430 and an M spike of 2.9 g/dL on 07/20/2014. Bone marrow biopsy on 08/30/2014 showed 78% plasma cell dyscrasia.   Prior Therapy: Status post packed red cell transfusions during hospitalization in February 2016. This was repeated in May 2016.  She is status post a bone marrow biopsy on 08/30/2014.  Current therapy: Under consideration to start systemic therapy. I anticipate starting Velcade and dexamethasone weekly regimen starting on 09/15/2014.  Interim History: [name] presents today for a follow-up visit. Since our last visit, she continues to do poorly. She reported increased fatigue and tiredness. Also had poor appetite and 3 pound weight loss. She does not report any new pain at this time. She has not reported any pathological fractures. She did report one episode of a fall without any hurting herself. She still have some occasional exertional dyspnea and occasional diarrhea. She does not report any new complaints. She has not reported any bone pain, pathological fracture or neuropathy.  She does not report any headaches, blurry vision, syncope or seizures. She does not report any fevers, chills, sweats. She does not report any chest pain, palpitation orthopnea. Does not report any cough, hemoptysis or hematemesis. She does report occasional nausea but no vomiting no hematochezia or melena. She does not report any frequency urgency or hesitancy. She does report chronic back pain which is unchanged. Remaining review of systems unremarkable.   Medications: I have reviewed the patient's current medications.  Current Outpatient Prescriptions  Medication Sig Dispense Refill  . albuterol-ipratropium (COMBIVENT) 18-103  MCG/ACT inhaler Inhale 1-2 puffs into the lungs every 4 (four) hours as needed for wheezing or shortness of breath.    [name] aspirin 325 MG EC tablet Take 325 mg by mouth every morning.     . cholecalciferol (VITAMIN D) 1000 UNITS tablet Take 1,000 Units by mouth every morning.     . ezetimibe (ZETIA) 10 MG tablet Take 1 tablet (10 mg total) by mouth at bedtime.    . hydrochlorothiazide (HYDRODIURIL) 25 MG tablet Take 25 mg by mouth every morning.   0  . nitroGLYCERIN (NITROSTAT) 0.4 MG SL tablet Place 0.4 mg under the tongue every 5 (five) minutes as needed for chest pain.    . Omega-3 Fatty Acids (FISH OIL) 1200 MG CAPS Take 1,200-2,400 mg by mouth 2 (two) times daily. Take 2 capsules (2400 mg) every morning and 1 capsule (1200 mg) every night    . pantoprazole (PROTONIX) 40 MG tablet Take 1 tablet (40 mg total) by mouth 2 (two) times daily.  0  . polyethylene glycol powder (GLYCOLAX/MIRALAX) powder Take 2 Containers by mouth at bedtime. Mix with 8 oz liquid and drink  0  . prochlorperazine (COMPAZINE) 10 MG tablet Take 10 mg by mouth 3 (three) times daily. As needed for nausea/vomiting  0  . RA SENNA 8.6 MG tablet Take 1 tablet by mouth daily as needed for constipation.   0  . sulfamethoxazole-trimethoprim (BACTRIM DS) 800-160 MG per tablet Take 1 tablet by mouth 2 (two) times daily. 10 tablet 0  . tiotropium (SPIRIVA) 18 MCG inhalation capsule Place 18 mcg into inhaler and inhale daily as needed (shortness of breath).     . zolpidem (AMBIEN) 5 MG tablet Take 5  mg by mouth at bedtime as needed for sleep.    [name] dexamethasone (DECADRON) 4 MG tablet Take 5 tablets every week before chemotherapy injection. 40 tablet 3  . megestrol (MEGACE) 400 MG/10ML suspension Take 10 mLs (400 mg total) by mouth 2 (two) times daily. 240 mL 0   No current facility-administered medications for this visit.     Allergies:  Allergies  Allergen Reactions  . Lexapro [Escitalopram Oxalate] Other (See Comments)     Fatigue   . Soma [Carisoprodol] Other (See Comments)    Fatigue   . Wellbutrin [Bupropion] Other (See Comments)    "couldn't function and take it"  . Albuterol Sulfate Other (See Comments)    Albuterol HFA inhaler caused nervousness   . Codeine Hives  . Levaquin [Levofloxacin In D5w] Other (See Comments)    Unknown allergic reaction  . Plavix [Clopidogrel Bisulfate] Other (See Comments)    Unknown reaction  . Statins     Past Medical History, Surgical history, Social history, and Family History were reviewed and updated.   Physical Exam: Blood pressure 131/48, pulse 63, resp. rate 18, height _0  (1.575 m), weight 125 lb 1.6 oz (56.745 kg), SpO2 100 %. ECOG: 1 General appearance: alert and cooperative. Chronically ill-appearing. Head: Normocephalic, without obvious abnormality Neck: no adenopathy Lymph nodes: Cervical, supraclavicular, and axillary nodes normal. Heart:regular rate and rhythm, S1, S2 normal, no murmur, click, rub or gallop Lung:chest clear, no wheezing, rales, normal symmetric air entry Abdomin: soft, non-tender, without masses or organomegaly EXT:no erythema, induration, or nodules   Lab Results: Lab Results  Component Value Date   WBC 2.7* 08/30/2014   HGB 8.4* 08/30/2014   HCT 26.2* 08/30/2014   MCV 98.9 08/30/2014   PLT 120* 08/30/2014     Chemistry      Component Value Date/Time   NA 134* 08/17/2014 0533   NA 138 07/20/2014 1429   K 4.1 08/17/2014 0533   K 3.9 07/20/2014 1429   CL 102 08/17/2014 0533   CO2 27 08/17/2014 0533   CO2 27 07/20/2014 1429   BUN 17 08/17/2014 0533   BUN 20.8 07/20/2014 1429   CREATININE 1.02* 08/17/2014 0533   CREATININE 1.2* 07/20/2014 1429      Component Value Date/Time   CALCIUM 9.1 08/17/2014 0533   CALCIUM 9.4 07/20/2014 1429   ALKPHOS 69 08/17/2014 0533   ALKPHOS 60 07/20/2014 1429   AST 29 08/17/2014 0533   AST 22 07/20/2014 1429   ALT 17 08/17/2014 0533   ALT 13 07/20/2014 1429   BILITOT 0.7  08/17/2014 0533   BILITOT 0.62 07/20/2014 1429         Impression and Plan:  81 year old woman with the following issues:  1. IgG kappa plasma cell disorder with 78% plasma cell infiltration in the bone marrow. She is quite symptomatic and certainly requires treatment. She meets the criteria of multiple myeloma given her anemia and overall symptomatology.  The natural course of this disease was discussed with the patient and her daughter extensively. She understand that this is an incurable malignancy and any treatment would be palliative. She is not a transplant candidate and I do not think she could tolerate 3 drug regimen. Options of treatments will be systemic chemotherapy versus hospice care. I think she is able to tolerate therapy and certainly will offer palliation of her symptoms.  The logistics of administration of Velcade chemotherapy with dexamethasone was discussed. Complications include peripheral neuropathy, myelosuppression, pancreatitis, GI toxicity, hypoglycemia among others  were discussed. She is agreeable to proceed and we will tentatively start systemic therapy on 09/15/2014.  2. Anemia: Related to plasma cell disorder. Her hemoglobin is around 9.2 on 08/24/2014 for a little lower at 8.4 on 08/30/2014. We will check a CBC before the first treatment and transfuse as needed.  3. Weight loss: I gave her prescription for Megace with instructions to use it. Complications include the pain thrombosis were discussed with the patient regarding this medication.  4. Follow-up: Will be in 2 weeks to assess complications from this therapy.   Zola Button, MD 6/2/201612:33 PM

## 2014-09-07 NOTE — Telephone Encounter (Signed)
Gave adn printed appt sched and avs fo rpt for June and July  °

## 2014-09-09 ENCOUNTER — Other Ambulatory Visit: Payer: Self-pay

## 2014-09-09 ENCOUNTER — Encounter (HOSPITAL_COMMUNITY): Payer: Self-pay | Admitting: Emergency Medicine

## 2014-09-09 ENCOUNTER — Inpatient Hospital Stay (HOSPITAL_COMMUNITY)
Admission: EM | Admit: 2014-09-09 | Discharge: 2014-09-13 | DRG: 069 | Disposition: A | Discharge status: Skilled Nursing Facility | Payer: Medicare Other | Attending: Internal Medicine | Admitting: Internal Medicine

## 2014-09-09 DIAGNOSIS — C9000 Multiple myeloma not having achieved remission: Secondary | ICD-10-CM | POA: Diagnosis present

## 2014-09-09 DIAGNOSIS — Z955 Presence of coronary angioplasty implant and graft: Secondary | ICD-10-CM

## 2014-09-09 DIAGNOSIS — Z7982 Long term (current) use of aspirin: Secondary | ICD-10-CM

## 2014-09-09 DIAGNOSIS — G459 Transient cerebral ischemic attack, unspecified: Secondary | ICD-10-CM | POA: Diagnosis not present

## 2014-09-09 DIAGNOSIS — I495 Sick sinus syndrome: Secondary | ICD-10-CM | POA: Diagnosis present

## 2014-09-09 DIAGNOSIS — I25810 Atherosclerosis of coronary artery bypass graft(s) without angina pectoris: Secondary | ICD-10-CM | POA: Diagnosis present

## 2014-09-09 DIAGNOSIS — I4891 Unspecified atrial fibrillation: Secondary | ICD-10-CM | POA: Diagnosis present

## 2014-09-09 DIAGNOSIS — D696 Thrombocytopenia, unspecified: Secondary | ICD-10-CM | POA: Diagnosis present

## 2014-09-09 DIAGNOSIS — D630 Anemia in neoplastic disease: Secondary | ICD-10-CM | POA: Diagnosis present

## 2014-09-09 DIAGNOSIS — I959 Hypotension, unspecified: Secondary | ICD-10-CM | POA: Diagnosis present

## 2014-09-09 DIAGNOSIS — Z79899 Other long term (current) drug therapy: Secondary | ICD-10-CM

## 2014-09-09 DIAGNOSIS — N189 Chronic kidney disease, unspecified: Secondary | ICD-10-CM | POA: Diagnosis present

## 2014-09-09 DIAGNOSIS — R001 Bradycardia, unspecified: Secondary | ICD-10-CM | POA: Diagnosis present

## 2014-09-09 DIAGNOSIS — D649 Anemia, unspecified: Secondary | ICD-10-CM | POA: Diagnosis present

## 2014-09-09 DIAGNOSIS — R29898 Other symptoms and signs involving the musculoskeletal system: Secondary | ICD-10-CM | POA: Diagnosis present

## 2014-09-09 DIAGNOSIS — Z87891 Personal history of nicotine dependence: Secondary | ICD-10-CM

## 2014-09-09 DIAGNOSIS — I252 Old myocardial infarction: Secondary | ICD-10-CM

## 2014-09-09 DIAGNOSIS — N179 Acute kidney failure, unspecified: Secondary | ICD-10-CM | POA: Diagnosis present

## 2014-09-09 DIAGNOSIS — E860 Dehydration: Secondary | ICD-10-CM | POA: Diagnosis present

## 2014-09-09 DIAGNOSIS — R51 Headache: Secondary | ICD-10-CM | POA: Diagnosis not present

## 2014-09-09 DIAGNOSIS — E785 Hyperlipidemia, unspecified: Secondary | ICD-10-CM | POA: Diagnosis present

## 2014-09-09 DIAGNOSIS — G451 Carotid artery syndrome (hemispheric): Secondary | ICD-10-CM | POA: Diagnosis present

## 2014-09-09 DIAGNOSIS — I129 Hypertensive chronic kidney disease with stage 1 through stage 4 chronic kidney disease, or unspecified chronic kidney disease: Secondary | ICD-10-CM | POA: Diagnosis present

## 2014-09-09 LAB — I-STAT CHEM 8, ED
BUN: 31 mg/dL — ABNORMAL HIGH (ref 6–20)
CALCIUM ION: 1.2 mmol/L (ref 1.13–1.30)
Chloride: 105 mmol/L (ref 101–111)
Creatinine, Ser: 1.4 mg/dL — ABNORMAL HIGH (ref 0.44–1.00)
Glucose, Bld: 160 mg/dL — ABNORMAL HIGH (ref 65–99)
HCT: 24 % — ABNORMAL LOW (ref 36.0–46.0)
Hemoglobin: 8.2 g/dL — ABNORMAL LOW (ref 12.0–15.0)
Potassium: 3.9 mmol/L (ref 3.5–5.1)
Sodium: 141 mmol/L (ref 135–145)
TCO2: 20 mmol/L (ref 0–100)

## 2014-09-09 NOTE — ED Notes (Addendum)
Daughter called 25 because pt was having "Difficulting picking up left leg to put in bed"; "she has never had this issue before"; pt reports recent dx of multiple myeloma and starts chemo next week; pt also c/o headache; pt reports taking "sleeping medications prior to bed"; pt reports weakness has improved since calling 911; daughter also reports 6 admissions since January 2016 for dehydration; pt CAOx4 at this time

## 2014-09-09 NOTE — ED Provider Notes (Signed)
CSN: [id number]     Arrival date & time 09/09/14  2238 History   This chart was scribed for Linton Flemings, MD by Randa Evens, ED Scribe. This patient was seen in room A11C/A11C and the patient's care was started at 11:02 PM.   Chief Complaint  Patient presents with  . Bradycardia  . Extremity Weakness  . Headache   Patient is a 81 y.o. female presenting with extremity weakness and headaches. The history is provided by the patient. No language interpreter was used.  Extremity Weakness Associated symptoms include headaches.  Headache  HPI Comments: JAKAI ONOFRE is a 81 y.o. female with PMHX and PSHx listed below brought in by ambulance, who presents to the Emergency Department complaining of rleft leg weakness onset tonight. Pt states she has having difficulty lifting her right leg in the bed. She states she has just taking her sleeping medication. Pt has recently been diagnosed with multiple myeloma and is going to start chemotherapy soon. Pt denies CP or SOB. Pt states she has a Hx of a-fib. Pt reports she is on aspirin daily as well.    Past Medical History  Diagnosis Date  . COPD (chronic obstructive pulmonary disease)   . Gout   . Coronary atherosclerosis of native coronary artery 02/05/2012  . Hypertension   . High blood cholesterol   . Heart murmur     "all my life" (02/05/2012)  . Anginal pain   . Myocardial infarction 1990's; 2000's    "2 total, I think" (02/05/2012)  . Pneumonia     "several times" (02/05/2012)  . Chronic bronchitis   . Shortness of breath     "sometimes when I'm laying down; always when I do too much" (02/05/2012)  . History of blood transfusion   . External bleeding hemorrhoids     "act up at times" (02/05/2012)  . Chronic back pain     "all over" (02/05/2012)  . Depression   . CAD (coronary artery disease) of artery bypass graft 02/09/14    atretic LIMA  . Multiple myeloma 09/07/2014   Past Surgical History  Procedure Laterality Date  .  Appendectomy  ? 1970's  . Ectopic pregnancy surgery  1970's  . Abdominal hysterectomy  ? 1970's    "partial 1st time; complete 2nd" (02/05/2012)  . Cholecystectomy  ~ 2010  . Cataract extraction w/ intraocular lens  implant, bilateral  [id number]  . Coronary angioplasty with stent placement  2005  . Coronary artery bypass graft  2005    CABG X2  . Cardiac catheterization  02/09/14    atretic LIMA,  patent VG-dRCA and Total occlusion of the native RCA proximally prior to remotely placement RCA stent, with mild proximal 30% LAD stenosis and 30% distal circumflex stenoses.  EF 55%.  . Left heart catheterization with coronary/graft angiogram N/A 02/09/2014    Procedure: LEFT HEART CATHETERIZATION WITH Beatrix Fetters;  Surgeon: Troy Sine, MD;  Location: [facility] CATH LAB;  Service: Cardiovascular;  Laterality: N/A;  . Esophagogastroduodenoscopy (egd) with propofol Left 05/22/2014    Procedure: ESOPHAGOGASTRODUODENOSCOPY (EGD) WITH PROPOFOL;  Surgeon: Arta Silence, MD;  Location: [facility] ENDOSCOPY;  Service: Endoscopy;  Laterality: Left;   Family History  Problem Relation Age of Onset  . Anemia Neg Hx   . Arrhythmia Neg Hx   . Asthma Neg Hx   . Clotting disorder Neg Hx   . Fainting Neg Hx   . Heart attack Neg Hx   . Heart disease Neg Hx   .  Heart failure Neg Hx   . Hyperlipidemia Neg Hx   . Hypertension Neg Hx   . Aneurysm    . CVA    . CVA Mother   . Aneurysm Father    History  Substance Use Topics  . Smoking status: Former Smoker -- 1.00 packs/day for 50 years    Types: Cigarettes    Quit date: 04/02/2011  . Smokeless tobacco: Never Used  . Alcohol Use: Yes     Comment: says she drinks wine once in a while   OB History    No data available      Review of Systems  Musculoskeletal: Positive for extremity weakness.  Neurological: Positive for headaches.  A complete 10 system review of systems was obtained and all systems are negative except as noted in the HPI and  PMH.     Allergies  Lexapro; Soma; Wellbutrin; Albuterol sulfate; Codeine; Levaquin; Plavix; and Statins  Home Medications   Prior to Admission medications   Medication Sig Start Date End Date Taking? Authorizing Provider  albuterol-ipratropium (COMBIVENT) 18-103 MCG/ACT inhaler Inhale 1-2 puffs into the lungs every 4 (four) hours as needed for wheezing or shortness of breath.    Historical Provider, MD  aspirin 325 MG EC tablet Take 325 mg by mouth every morning.     Historical Provider, MD  cholecalciferol (VITAMIN D) 1000 UNITS tablet Take 1,000 Units by mouth every morning.     Historical Provider, MD  dexamethasone (DECADRON) 4 MG tablet Take 5 tablets every week before chemotherapy injection. 09/07/14   Wyatt Portela, MD  ezetimibe (ZETIA) 10 MG tablet Take 1 tablet (10 mg total) by mouth at bedtime. 05/25/14   Hosie Poisson, MD  hydrochlorothiazide (HYDRODIURIL) 25 MG tablet Take 25 mg by mouth every morning.  05/16/14   Historical Provider, MD  megestrol (MEGACE) 400 MG/10ML suspension Take 10 mLs (400 mg total) by mouth 2 (two) times daily. 09/07/14   Wyatt Portela, MD  nitroGLYCERIN (NITROSTAT) 0.4 MG SL tablet Place 0.4 mg under the tongue every 5 (five) minutes as needed for chest pain.    Historical Provider, MD  Omega-3 Fatty Acids (FISH OIL) 1200 MG CAPS Take 1,200-2,400 mg by mouth 2 (two) times daily. Take 2 capsules (2400 mg) every morning and 1 capsule (1200 mg) every night    Historical Provider, MD  pantoprazole (PROTONIX) 40 MG tablet Take 1 tablet (40 mg total) by mouth 2 (two) times daily. 05/25/14   Hosie Poisson, MD  polyethylene glycol powder (GLYCOLAX/MIRALAX) powder Take 2 Containers by mouth at bedtime. Mix with 8 oz liquid and drink 05/16/14   Historical Provider, MD  prochlorperazine (COMPAZINE) 10 MG tablet Take 10 mg by mouth 3 (three) times daily. As needed for nausea/vomiting 06/29/14   Historical Provider, MD  RA SENNA 8.6 MG tablet Take 1 tablet by mouth daily as  needed for constipation.  02/07/14   Historical Provider, MD  sulfamethoxazole-trimethoprim (BACTRIM DS) 800-160 MG per tablet Take 1 tablet by mouth 2 (two) times daily. 08/17/14   Florencia Reasons, MD  tiotropium (SPIRIVA) 18 MCG inhalation capsule Place 18 mcg into inhaler and inhale daily as needed (shortness of breath).     Historical Provider, MD  zolpidem (AMBIEN) 5 MG tablet Take 5 mg by mouth at bedtime as needed for sleep.    Historical Provider, MD   BP 148/47 mmHg  Pulse 44  Temp(Src) 97.8 F (36.6 C) (Oral)  Resp 19  SpO2 94%  Physical Exam  Constitutional: She is oriented to person, place, and time. She appears well-developed and well-nourished. No distress.  HENT:  Head: Normocephalic and atraumatic.  Nose: Nose normal.  Mouth/Throat: Oropharynx is clear and moist.  Eyes: Conjunctivae and EOM are normal. Pupils are equal, round, and reactive to light.  Neck: Normal range of motion. Neck supple. No JVD present. No tracheal deviation present. No thyromegaly present.  Cardiovascular: Normal heart sounds and intact distal pulses.  Exam reveals no gallop and no friction rub.   No murmur heard. Bradycardia noted, irregular rhythm  Pulmonary/Chest: Effort normal and breath sounds normal. No stridor. No respiratory distress. She has no wheezes. She has no rales. She exhibits no tenderness.  Abdominal: Soft. Bowel sounds are normal. She exhibits no distension and no mass. There is no tenderness. There is no rebound and no guarding.  Musculoskeletal: Normal range of motion. She exhibits no edema or tenderness.  Lymphadenopathy:    She has no cervical adenopathy.  Neurological: She is alert and oriented to person, place, and time. She displays normal reflexes. No cranial nerve deficit. She exhibits normal muscle tone. Coordination normal.  Patient with weakness of left lower extremity, 3/5 compared to 5/5 and right lower extremity.  No weakness in left upper extremity.  No other focal findings  on neuro exam.  Skin: Skin is warm and dry. No rash noted. No erythema. No pallor.  Psychiatric: She has a normal mood and affect. Her behavior is normal. Judgment and thought content normal.  Nursing note and vitals reviewed.   ED Course  Procedures (including critical care time) DIAGNOSTIC STUDIES: Oxygen Saturation is 94% on RA, normal by my interpretation.    COORDINATION OF CARE: 11:24 PM-Discussed treatment plan with pt at bedside and pt agreed to plan.     Labs Review Labs Reviewed  COMPREHENSIVE METABOLIC PANEL - Abnormal; Notable for the following:    Glucose, Bld 156 (*)    BUN 30 (*)    Creatinine, Ser 1.44 (*)    Total Protein 9.1 (*)    Albumin 3.2 (*)    GFR calc non Af Amer 33 (*)    GFR calc Af Amer 38 (*)    All other components within normal limits  CBC WITH DIFFERENTIAL/PLATELET - Abnormal; Notable for the following:    RBC 2.31 (*)    Hemoglobin 7.4 (*)    HCT 22.4 (*)    RDW 19.9 (*)    Platelets 111 (*)    All other components within normal limits  I-STAT CHEM 8, ED - Abnormal; Notable for the following:    BUN 31 (*)    Creatinine, Ser 1.40 (*)    Glucose, Bld 160 (*)    Hemoglobin 8.2 (*)    HCT 24.0 (*)    All other components within normal limits  TROPONIN I  TYPE AND SCREEN  PREPARE RBC (CROSSMATCH)    Imaging Review Ct Head Wo Contrast  09/10/2014   CLINICAL DATA:  Initial evaluation for acute extremity weakness.  EXAM: CT HEAD WITHOUT CONTRAST  TECHNIQUE: Contiguous axial images were obtained from the base of the skull through the vertex without intravenous contrast.  COMPARISON:  Prior study from 08/13/2014.  FINDINGS: Generalized cerebral atrophy with chronic microvascular ischemic disease present, similar to previous. Prominent vascular calcifications present within the carotid siphons.  No acute large vessel territory infarct. No intracranial hemorrhage. No mass lesion or midline shift. No hydrocephalus. No extra-axial fluid collection.   Scalp soft  tissues within normal limits. No acute abnormality about the orbits.  Paranasal sinuses are clear.  No mastoid effusion.  Calvarium intact.  IMPRESSION: 1. No acute intracranial process. 2. Generalized cerebral atrophy with mild chronic microvascular ischemic disease, similar to previous.   Electronically Signed   By: Jeannine Boga M.D.   On: 09/10/2014 02:54     EKG Interpretation   Date/Time:  Saturday September 09 2014 22:55:47 EDT Ventricular Rate:  53 PR Interval:  233 QRS Duration: 94 QT Interval:  586 QTC Calculation: 550 R Axis:   90 Text Interpretation:  Sinus rhythm with premature atrial complex Prolonged  PR interval Borderline right axis deviation Borderline T wave  abnormalities Minimal ST elevation, inferior leads Prolonged QT interval  Confirmed by Paulette Rockford  MD, Virgina Deakins ([id number]) on 09/09/2014 11:34:03 PM      EKG Interpretation  Date/Time:  Saturday September 09 2014 23:34:29 EDT Ventricular Rate:  47 PR Interval:  225 QRS Duration: 96 QT Interval:  525 QTC Calculation: 464 R Axis:   87 Text Interpretation:  Sinus bradycardia Borderline prolonged PR interval Borderline right axis deviation Nonspecific T abnrm, anterolateral leads Confirmed by Amario Longmore  MD, Delanie Tirrell ([id number]) on 09/09/2014 11:38:43 PM          MDM   Final diagnoses:  SSS (sick sinus syndrome)  Transient left leg weakness  Anemia in neoplastic disease    81 year old female who presents due to weakness of left leg starting this evening.  Patient noted to have bradycardia into the 20s.  Patient denies any chest pain, shortness of breath.  On physical exam, weakness of the left lower extremity only.  3\5.  Rate ranges from 20s to 50s.  Patient asymptomatic, now hypotensive mentating.  Will discuss with cardiology after head CT.  Labs pending.  I personally performed the services described in this documentation, which was scribed in my presence. The recorded information has been reviewed and is  accurate.     12:14 AM Patient reexamined, continues to be stable.  Cardiology consult completed, will see patient.  Reexam shows left leg strength is much improved, now 4-5 compared to the other leg.  Awaiting CT head  Restless consult, and they are uncomfortable taking care of the patient.  Due to persistent bradycardia.  They request cardiology admission.  Neurology.  The consultative for possible TIA.  Patient's left leg weakness has resolved.  Head CT is negative.  Patient received 1 unit of blood transfusion  Linton Flemings, MD 09/10/14 [phone]

## 2014-09-10 ENCOUNTER — Emergency Department (HOSPITAL_COMMUNITY): Payer: Medicare Other

## 2014-09-10 ENCOUNTER — Inpatient Hospital Stay (HOSPITAL_COMMUNITY): Payer: Medicare Other

## 2014-09-10 ENCOUNTER — Other Ambulatory Visit (HOSPITAL_COMMUNITY): Payer: Medicare Other

## 2014-09-10 DIAGNOSIS — N179 Acute kidney failure, unspecified: Secondary | ICD-10-CM | POA: Diagnosis present

## 2014-09-10 DIAGNOSIS — D630 Anemia in neoplastic disease: Secondary | ICD-10-CM | POA: Diagnosis not present

## 2014-09-10 DIAGNOSIS — G459 Transient cerebral ischemic attack, unspecified: Secondary | ICD-10-CM | POA: Diagnosis present

## 2014-09-10 DIAGNOSIS — I495 Sick sinus syndrome: Secondary | ICD-10-CM | POA: Diagnosis present

## 2014-09-10 DIAGNOSIS — G451 Carotid artery syndrome (hemispheric): Secondary | ICD-10-CM | POA: Diagnosis present

## 2014-09-10 DIAGNOSIS — C9000 Multiple myeloma not having achieved remission: Secondary | ICD-10-CM | POA: Diagnosis present

## 2014-09-10 DIAGNOSIS — I129 Hypertensive chronic kidney disease with stage 1 through stage 4 chronic kidney disease, or unspecified chronic kidney disease: Secondary | ICD-10-CM | POA: Diagnosis present

## 2014-09-10 DIAGNOSIS — Z955 Presence of coronary angioplasty implant and graft: Secondary | ICD-10-CM | POA: Diagnosis not present

## 2014-09-10 DIAGNOSIS — G458 Other transient cerebral ischemic attacks and related syndromes: Secondary | ICD-10-CM | POA: Diagnosis not present

## 2014-09-10 DIAGNOSIS — I4891 Unspecified atrial fibrillation: Secondary | ICD-10-CM | POA: Diagnosis present

## 2014-09-10 DIAGNOSIS — E860 Dehydration: Secondary | ICD-10-CM | POA: Diagnosis present

## 2014-09-10 DIAGNOSIS — R51 Headache: Secondary | ICD-10-CM | POA: Diagnosis present

## 2014-09-10 DIAGNOSIS — R29898 Other symptoms and signs involving the musculoskeletal system: Secondary | ICD-10-CM | POA: Diagnosis present

## 2014-09-10 DIAGNOSIS — I10 Essential (primary) hypertension: Secondary | ICD-10-CM | POA: Diagnosis not present

## 2014-09-10 DIAGNOSIS — E785 Hyperlipidemia, unspecified: Secondary | ICD-10-CM | POA: Diagnosis not present

## 2014-09-10 DIAGNOSIS — I639 Cerebral infarction, unspecified: Secondary | ICD-10-CM

## 2014-09-10 DIAGNOSIS — Z79899 Other long term (current) drug therapy: Secondary | ICD-10-CM | POA: Diagnosis not present

## 2014-09-10 DIAGNOSIS — I252 Old myocardial infarction: Secondary | ICD-10-CM | POA: Diagnosis not present

## 2014-09-10 DIAGNOSIS — I25810 Atherosclerosis of coronary artery bypass graft(s) without angina pectoris: Secondary | ICD-10-CM

## 2014-09-10 DIAGNOSIS — I959 Hypotension, unspecified: Secondary | ICD-10-CM | POA: Diagnosis present

## 2014-09-10 DIAGNOSIS — N189 Chronic kidney disease, unspecified: Secondary | ICD-10-CM | POA: Diagnosis not present

## 2014-09-10 DIAGNOSIS — R001 Bradycardia, unspecified: Secondary | ICD-10-CM | POA: Diagnosis present

## 2014-09-10 DIAGNOSIS — Z87891 Personal history of nicotine dependence: Secondary | ICD-10-CM | POA: Diagnosis not present

## 2014-09-10 DIAGNOSIS — Z7982 Long term (current) use of aspirin: Secondary | ICD-10-CM | POA: Diagnosis not present

## 2014-09-10 DIAGNOSIS — D696 Thrombocytopenia, unspecified: Secondary | ICD-10-CM | POA: Diagnosis present

## 2014-09-10 DIAGNOSIS — G464 Cerebellar stroke syndrome: Secondary | ICD-10-CM | POA: Diagnosis not present

## 2014-09-10 LAB — CBC WITH DIFFERENTIAL/PLATELET
BASOS ABS: 0 10*3/uL (ref 0.0–0.1)
Basophils Relative: 0 % (ref 0–1)
Eosinophils Absolute: 0 10*3/uL (ref 0.0–0.7)
Eosinophils Relative: 0 % (ref 0–5)
HCT: 22.4 % — ABNORMAL LOW (ref 36.0–46.0)
Hemoglobin: 7.4 g/dL — ABNORMAL LOW (ref 12.0–15.0)
Lymphocytes Relative: 17 % (ref 12–46)
Lymphs Abs: 1 10*3/uL (ref 0.7–4.0)
MCH: 32 pg (ref 26.0–34.0)
MCHC: 33 g/dL (ref 30.0–36.0)
MCV: 97 fL (ref 78.0–100.0)
MONO ABS: 0.5 10*3/uL (ref 0.1–1.0)
Monocytes Relative: 8 % (ref 3–12)
NEUTROS PCT: 75 % (ref 43–77)
Neutro Abs: 4.5 10*3/uL (ref 1.7–7.7)
Platelets: 111 10*3/uL — ABNORMAL LOW (ref 150–400)
RBC: 2.31 MIL/uL — ABNORMAL LOW (ref 3.87–5.11)
RDW: 19.9 % — ABNORMAL HIGH (ref 11.5–15.5)
WBC: 6 10*3/uL (ref 4.0–10.5)

## 2014-09-10 LAB — COMPREHENSIVE METABOLIC PANEL
ALBUMIN: 3.2 g/dL — AB (ref 3.5–5.0)
ALT: 14 U/L (ref 14–54)
AST: 22 U/L (ref 15–41)
Alkaline Phosphatase: 58 U/L (ref 38–126)
Anion gap: 8 (ref 5–15)
BUN: 30 mg/dL — ABNORMAL HIGH (ref 6–20)
CO2: 23 mmol/L (ref 22–32)
CREATININE: 1.44 mg/dL — AB (ref 0.44–1.00)
Calcium: 9.2 mg/dL (ref 8.9–10.3)
Chloride: 104 mmol/L (ref 101–111)
GFR calc Af Amer: 38 mL/min — ABNORMAL LOW (ref >60)
GFR, EST NON AFRICAN AMERICAN: 33 mL/min — AB (ref >60)
GLUCOSE: 156 mg/dL — AB (ref 65–99)
POTASSIUM: 3.8 mmol/L (ref 3.5–5.1)
Sodium: 135 mmol/L (ref 135–145)
Total Bilirubin: 0.5 mg/dL (ref 0.3–1.2)
Total Protein: 9.1 g/dL — ABNORMAL HIGH (ref 6.5–8.1)

## 2014-09-10 LAB — CREATININE, SERUM
Creatinine, Ser: 1.22 mg/dL — ABNORMAL HIGH (ref 0.44–1.00)
GFR calc Af Amer: 47 mL/min — ABNORMAL LOW (ref >60)
GFR calc non Af Amer: 40 mL/min — ABNORMAL LOW (ref >60)

## 2014-09-10 LAB — CBC
HCT: 27.5 % — ABNORMAL LOW (ref 36.0–46.0)
HEMOGLOBIN: 9.3 g/dL — AB (ref 12.0–15.0)
MCH: 31.2 pg (ref 26.0–34.0)
MCHC: 33.8 g/dL (ref 30.0–36.0)
MCV: 92.3 fL (ref 78.0–100.0)
PLATELETS: 97 10*3/uL — AB (ref 150–400)
RBC: 2.98 MIL/uL — ABNORMAL LOW (ref 3.87–5.11)
RDW: 21.8 % — ABNORMAL HIGH (ref 11.5–15.5)
WBC: 6.8 10*3/uL (ref 4.0–10.5)

## 2014-09-10 LAB — LIPID PANEL
CHOLESTEROL: 126 mg/dL (ref 0–200)
HDL: 20 mg/dL — AB (ref >40)
LDL Cholesterol: 63 mg/dL (ref 0–99)
TRIGLYCERIDES: 213 mg/dL — AB (ref <150)
Total CHOL/HDL Ratio: 6.3 RATIO
VLDL: 43 mg/dL — ABNORMAL HIGH (ref 0–40)

## 2014-09-10 LAB — PREPARE RBC (CROSSMATCH)

## 2014-09-10 LAB — MRSA PCR SCREENING: MRSA by PCR: NEGATIVE

## 2014-09-10 LAB — TROPONIN I

## 2014-09-10 MED ORDER — HYDROCHLOROTHIAZIDE 25 MG PO TABS
25.0000 mg | ORAL_TABLET | Freq: Every morning | ORAL | Status: DC
  Administered 2014-09-10 – 2014-09-11 (×2): 25 mg via ORAL
  Filled 2014-09-10 (×2): qty 1

## 2014-09-10 MED ORDER — TIOTROPIUM BROMIDE MONOHYDRATE 18 MCG IN CAPS
18.0000 ug | ORAL_CAPSULE | Freq: Every day | RESPIRATORY_TRACT | Status: DC | PRN
  Filled 2014-09-10: qty 5

## 2014-09-10 MED ORDER — IPRATROPIUM-ALBUTEROL 0.5-2.5 (3) MG/3ML IN SOLN: 3.0000 mL | RESPIRATORY_TRACT | Status: DC | PRN

## 2014-09-10 MED ORDER — SODIUM CHLORIDE 0.9 % IV SOLN
Freq: Once | INTRAVENOUS | Status: AC
  Administered 2014-09-10: 03:00:00 via INTRAVENOUS

## 2014-09-10 MED ORDER — PANTOPRAZOLE SODIUM 40 MG PO TBEC
40.0000 mg | DELAYED_RELEASE_TABLET | Freq: Two times a day (BID) | ORAL | Status: DC
  Administered 2014-09-10 – 2014-09-13 (×7): 40 mg via ORAL
  Filled 2014-09-10 (×7): qty 1

## 2014-09-10 MED ORDER — SENNA 8.6 MG PO TABS
2.0000 | ORAL_TABLET | Freq: Every day | ORAL | Status: DC
  Administered 2014-09-10 – 2014-09-11 (×2): 17.2 mg via ORAL
  Filled 2014-09-10 (×2): qty 2

## 2014-09-10 MED ORDER — ACETAMINOPHEN 325 MG PO TABS
650.0000 mg | ORAL_TABLET | Freq: Four times a day (QID) | ORAL | Status: DC | PRN
  Administered 2014-09-11 (×2): 650 mg via ORAL
  Filled 2014-09-10 (×2): qty 2

## 2014-09-10 MED ORDER — ASPIRIN 300 MG RE SUPP: 300.0000 mg | Freq: Every day | RECTAL | Status: DC

## 2014-09-10 MED ORDER — SODIUM CHLORIDE 0.9 % IJ SOLN
3.0000 mL | Freq: Two times a day (BID) | INTRAMUSCULAR | Status: DC
  Administered 2014-09-10 – 2014-09-13 (×7): 3 mL via INTRAVENOUS
  Filled 2014-09-10: qty 3

## 2014-09-10 MED ORDER — ACETAMINOPHEN 650 MG RE SUPP: 650.0000 mg | Freq: Four times a day (QID) | RECTAL | Status: DC | PRN

## 2014-09-10 MED ORDER — ATROPINE SULFATE 0.1 MG/ML IJ SOLN
INTRAMUSCULAR | Status: AC
  Filled 2014-09-10: qty 10

## 2014-09-10 MED ORDER — ASPIRIN 325 MG PO TABS: 325.0000 mg | ORAL_TABLET | Freq: Every day | ORAL | Status: DC

## 2014-09-10 MED ORDER — EZETIMIBE 10 MG PO TABS
10.0000 mg | ORAL_TABLET | Freq: Every day | ORAL | Status: DC
  Administered 2014-09-10 – 2014-09-12 (×3): 10 mg via ORAL
  Filled 2014-09-10 (×3): qty 1

## 2014-09-10 MED ORDER — STROKE: EARLY STAGES OF RECOVERY BOOK
Freq: Once | Status: AC
  Administered 2014-09-10: 1
  Filled 2014-09-10: qty 1

## 2014-09-10 MED ORDER — ENOXAPARIN SODIUM 30 MG/0.3ML ~~LOC~~ SOLN
30.0000 mg | SUBCUTANEOUS | Status: DC
  Administered 2014-09-10 – 2014-09-13 (×4): 30 mg via SUBCUTANEOUS
  Filled 2014-09-10 (×4): qty 0.3

## 2014-09-10 MED ORDER — ASPIRIN EC 325 MG PO TBEC
325.0000 mg | DELAYED_RELEASE_TABLET | Freq: Every morning | ORAL | Status: DC
  Administered 2014-09-10 – 2014-09-13 (×4): 325 mg via ORAL
  Filled 2014-09-10 (×4): qty 1

## 2014-09-10 NOTE — Consult Note (Signed)
Neurology Consultation Reason for Consult: Leg weakness Referring Physician: Margaretha Seeds  CC: Leg weakness  History is obtained from:patient  HPI: RIYAH BARDON is a 81 y.o. female who presents with sudden onset leg weakness this evening. She states that she was walking normally and then tried to lift her leg to get into bed and noticed that she was unable to get her leg up into bed. After this when she tried to walk around she noticed that she was unable to walk normally. She did feel somewhat lightheaded at the time, but it still is clearly just her left leg and not both legs that was feeling weak. She denies back pain, leg pain other than the pain that is been present since her bone marrow biopsies.    ROS: A 14 point ROS was performed and is negative except as noted in the HPI.   Past Medical History  Diagnosis Date  . COPD (chronic obstructive pulmonary disease)   . Gout   . Coronary atherosclerosis of native coronary artery 02/05/2012  . Hypertension   . High blood cholesterol   . Heart murmur     "all my life" (02/05/2012)  . Anginal pain   . Myocardial infarction 1990's; 2000's    "2 total, I think" (02/05/2012)  . Pneumonia     "several times" (02/05/2012)  . Chronic bronchitis   . Shortness of breath     "sometimes when I'm laying down; always when I do too much" (02/05/2012)  . History of blood transfusion   . External bleeding hemorrhoids     "act up at times" (02/05/2012)  . Chronic back pain     "all over" (02/05/2012)  . Depression   . CAD (coronary artery disease) of artery bypass graft 02/09/14    atretic LIMA  . Multiple myeloma 09/07/2014    Family History: Mother-essential tremor  Social History: Tob: Denies  Exam: Current vital signs: BP 133/44 mmHg  Pulse 45  Temp(Src) 98.3 F (36.8 C) (Oral)  Resp 19  SpO2 93% Vital signs in last 24 hours: Temp:  [97.8 F (36.6 C)-98.3 F (36.8 C)] 98.3 F (36.8 C) (06/05 0317) Pulse Rate:  [44-61] 45  (06/05 0317) Resp:  [15-21] 19 (06/05 0317) BP: (93-156)/(34-77) 133/44 mmHg (06/05 0317) SpO2:  [89 %-100 %] 93 % (06/05 0317)  Physical Exam  Constitutional: Appears well-developed and well-nourished.  Psych: Affect appropriate to situation Eyes: No scleral injection HENT: No OP obstrucion Head: Normocephalic.  Cardiovascular: severely bradycardic at times.  Respiratory: Effort normal  GI: Soft.  No distension. There is no tenderness.  Skin: WDI  Neuro: Mental Status: Patient is awake, alert, interactive and appropriate Patient is able to give a clear and coherent history. No signs of aphasia or neglect Cranial Nerves: II: Visual Fields are full. Pupils are equal, round, and reactive to light.   III,IV, VI: EOMI without ptosis or diploplia.  V: Facial sensation is symmetric to temperature VII: Facial movement is symmetric.  VIII: hearing is intact to voice X: Uvula elevates symmetrically XI: Shoulder shrug is symmetric. XII: tongue is midline without atrophy or fasciculations.  Motor: Tone is normal. Bulk is normal. 5/5 strength was present in bilateral arms. She has 5/5 strength in the right leg, 4-/5 in the left leg.  Sensory: Sensation is symmetric to light touch and temperature in the arms and legs. Cerebellar: FNF with intention tremor bilaterally.    I have reviewed labs in epic and the results pertinent to this  consultation are: hgb 7.4  I have reviewed the images obtained: CT head - negative.   Impression: 81 year old female who presents with sudden onset painless left leg weakness not fitting radicular pattern in the setting of atrial fibrillation. I do suspect that her most likely explanation is an acute stroke. An MRI brain would be able to tell this. Given her recent diagnosis, I'm not sure if anticoagulated are on option, but certainly if she were able to take anticoagulates this would be better at preventing stroke. I would not start it now in the acute  phase, however.  Recommendations: 1. HgbA1c, fasting lipid panel 2. MRI, MRA  of the brain without contrast 3. Frequent neuro checks 4. Echocardiogram 5. Carotid dopplers 6. Prophylactic therapy-Antiplatelet med: Aspirin - dose $Remo'325mg'ZoGBE$  PO or $Rem'300mg'wDUz$  PR 7. Risk factor modification 8. Telemetry monitoring 9. PT consult, OT consult, Speech consult    Roland Rack, MD [facility] [phone]  If 7pm- 7am, please page neurology on call as listed in [place].

## 2014-09-10 NOTE — Progress Notes (Signed)
Pt doing some 2nd degree HB type 1. Pt asymptomatic. Hr 40-60's. BP stable. Dr Tommi Rumps made aware. No new orders. Cont to monitor.

## 2014-09-10 NOTE — ED Notes (Signed)
Neurology MD XU at bedside.

## 2014-09-10 NOTE — H&P (Addendum)
Patient ID: [name] MRN: [id number], DOB/AGE: Nov 17, 1931   Admit date: 09/09/2014  Primary Physician: Georgann Housekeeper, MD Primary Cardiologist: Dr. Katrinka Blazing  Problem List  Past Medical History  Diagnosis Date  . COPD (chronic obstructive pulmonary disease)   . Gout   . Coronary atherosclerosis of native coronary artery 02/05/2012  . Hypertension   . High blood cholesterol   . Heart murmur     "all my life" (02/05/2012)  . Anginal pain   . Myocardial infarction 1990's; 2000's    "2 total, I think" (02/05/2012)  . Pneumonia     "several times" (02/05/2012)  . Chronic bronchitis   . Shortness of breath     "sometimes when I'm laying down; always when I do too much" (02/05/2012)  . History of blood transfusion   . External bleeding hemorrhoids     "act up at times" (02/05/2012)  . Chronic back pain     "all over" (02/05/2012)  . Depression   . CAD (coronary artery disease) of artery bypass graft 02/09/14    atretic LIMA  . Multiple myeloma 09/07/2014    Past Surgical History  Procedure Laterality Date  . Appendectomy  ? 1970's  . Ectopic pregnancy surgery  1970's  . Abdominal hysterectomy  ? 1970's    "partial 1st time; complete 2nd" (02/05/2012)  . Cholecystectomy  ~ 2010  . Cataract extraction w/ intraocular lens  implant, bilateral  [phone]  . Coronary angioplasty with stent placement  2005  . Coronary artery bypass graft  2005    CABG X2  . Cardiac catheterization  02/09/14    atretic LIMA,  patent VG-dRCA and Total occlusion of the native RCA proximally prior to remotely placement RCA stent, with mild proximal 30% LAD stenosis and 30% distal circumflex stenoses.  EF 55%.  . Left heart catheterization with coronary/graft angiogram N/A 02/09/2014    Procedure: LEFT HEART CATHETERIZATION WITH Isabel Caprice;  Surgeon: Lennette Bihari, MD;  Location: [facility] CATH LAB;  Service: Cardiovascular;  Laterality: N/A;  . Esophagogastroduodenoscopy (egd) with  propofol Left 05/22/2014    Procedure: ESOPHAGOGASTRODUODENOSCOPY (EGD) WITH PROPOFOL;  Surgeon: Willis Modena, MD;  Location: [facility] ENDOSCOPY;  Service: Endoscopy;  Laterality: Left;    Allergies  Allergies  Allergen Reactions  . Lexapro [Escitalopram Oxalate] Other (See Comments)    Fatigue   . Soma [Carisoprodol] Other (See Comments)    Fatigue   . Wellbutrin [Bupropion] Other (See Comments)    "couldn't function and take it"  . Albuterol Sulfate Other (See Comments)    Albuterol HFA inhaler caused nervousness   . Codeine Hives  . Levaquin [Levofloxacin In D5w] Other (See Comments)    Unknown allergic reaction  . Plavix [Clopidogrel Bisulfate] Other (See Comments)    Unknown reaction  . Statins    HPI:  81 yo F w a h/o CAD s/p 2vCABG (2005, LIMA-mLAD, SVG-RCA), HTN, HLD, COPD on home PRN O2, Depression, & Multiple myeloma with anemia of chronic disease (starting chemotherapy next week) presented this evening with LLE weakness, which the pt's noted when getting into bed tonight.  The weakness resolved while in the ED.  She additionally noted a right-sided headache this evening without associated nausea, diplopia, or photophobia that improved by the time of my evaluation.  CT of the head in the ED was negative for evidence of CVA.    Otherwise, while in the ED she was noted to be frequently bradycardic into the 20's to 30's.  For  the preceding 6 months, she noted palpitations lasting 30-40 minutes at a time, every 2-3 weeks.  She has associated lightheadedness.  She also has chronic, mild dyspnea with excessive exertion.  Otherwise, she denied chest pain, syncope/falls, lower extremity swelling, orthopnea, or paroxysmal nocturnal dyspnea.  At baseline, she is relatively active for her age, still driving & performing light housework.  Her primary cardiologist is Dr. Tamala Julian.   Of note, the pt has had several recent admissions:   - 08/13/14 to 08/17/14:  MDR Kleb UTI, mechanical fall, & anemia  related to MM (transfused 1 unit) - 05/20/14 to 2/18 /16:  Chest discomfort in the setting of anemia (transfused 1 unit).  GI workup including upper endoscopy was unremarkable.  Seen by Cardiology for bradycardia into the 30's at which time her Metoprolol was discontinued - 02/08/14 to 02/11/14:  Underwent cardiac catheterization  Relevant Data - EKG:  Sinus rhythm, RAD, poor R wave progression, T wave abnormality - similar to prior - TTE (05/21/14):  EF 50-55% with posterior basal HK, LA mildly dilated - Cardiac catheterization (02/09/14):  RCA CTO proximal to stent, pLAD 30%, dLCx 30%, LIMA-mLAD atretic, SVG-dRCA patent, dilated ascending aorta (3.6 cm), EF 55%  Home Medications  Prior to Admission medications   Medication Sig Start Date End Date Taking? Authorizing Provider  albuterol-ipratropium (COMBIVENT) 18-103 MCG/ACT inhaler Inhale 1-2 puffs into the lungs every 4 (four) hours as needed for wheezing or shortness of breath.   Yes Historical Provider, MD  aspirin 325 MG EC tablet Take 325 mg by mouth every morning.    Yes Historical Provider, MD  cholecalciferol (VITAMIN D) 1000 UNITS tablet Take 1,000 Units by mouth every morning.    Yes Historical Provider, MD  dexamethasone (DECADRON) 4 MG tablet Take 5 tablets every week before chemotherapy injection. 09/07/14  Yes Wyatt Portela, MD  ezetimibe (ZETIA) 10 MG tablet Take 1 tablet (10 mg total) by mouth at bedtime. 05/25/14  Yes Hosie Poisson, MD  hydrochlorothiazide (HYDRODIURIL) 25 MG tablet Take 25 mg by mouth every morning.  05/16/14  Yes Historical Provider, MD  megestrol (MEGACE) 400 MG/10ML suspension Take 10 mLs (400 mg total) by mouth 2 (two) times daily. 09/07/14  Yes Wyatt Portela, MD  nitroGLYCERIN (NITROSTAT) 0.4 MG SL tablet Place 0.4 mg under the tongue every 5 (five) minutes as needed for chest pain.   Yes Historical Provider, MD  Omega-3 Fatty Acids (FISH OIL) 1200 MG CAPS Take 1,200-2,400 mg by mouth 2 (two) times daily. Take 2  capsules (2400 mg) every morning and 1 capsule (1200 mg) every night   Yes Historical Provider, MD  pantoprazole (PROTONIX) 40 MG tablet Take 1 tablet (40 mg total) by mouth 2 (two) times daily. 05/25/14  Yes Hosie Poisson, MD  polyethylene glycol powder (GLYCOLAX/MIRALAX) powder Take 17 g by mouth every morning. Mix with 8 oz liquid and drink 05/16/14  Yes Historical Provider, MD  prochlorperazine (COMPAZINE) 10 MG tablet Take 10 mg by mouth 3 (three) times daily. As needed for nausea/vomiting 06/29/14  Yes Historical Provider, MD  RA SENNA 8.6 MG tablet Take 2 tablets by mouth at bedtime.  02/07/14  Yes Historical Provider, MD  tiotropium (SPIRIVA) 18 MCG inhalation capsule Place 18 mcg into inhaler and inhale daily as needed (shortness of breath).    Yes Historical Provider, MD  zolpidem (AMBIEN) 5 MG tablet Take 5 mg by mouth at bedtime as needed for sleep.   Yes Historical Provider, MD   Family  History  Family History  Problem Relation Age of Onset  . Anemia Neg Hx   . Arrhythmia Neg Hx   . Asthma Neg Hx   . Clotting disorder Neg Hx   . Fainting Neg Hx   . Heart attack Neg Hx   . Heart disease Neg Hx   . Heart failure Neg Hx   . Hyperlipidemia Neg Hx   . Hypertension Neg Hx   . Aneurysm    . CVA    . CVA Mother   . Aneurysm Father    Social History  History   Social History  . Marital Status: Widowed    Spouse Name: N/A  . Number of Children: N/A  . Years of Education: N/A   Occupational History  . Not on file.   Social History Main Topics  . Smoking status: Former Smoker -- 1.00 packs/day for 50 years    Types: Cigarettes    Quit date: 04/02/2011  . Smokeless tobacco: Never Used  . Alcohol Use: Yes     Comment: says she drinks wine once in a while  . Drug Use: No  . Sexual Activity: No     Comment: husband deceased since 88 hasn't had intercourse since then   Other Topics Concern  . Not on file   Social History Narrative    Review of Systems General:  No  chills, fever, night sweats or weight changes.  Cardiovascular:  + Palpitations, No chest pain, edema, orthopnea, paroxysmal nocturnal dyspnea. Dermatological: No rash, lesions/masses Respiratory: No cough, dyspnea Urologic: No hematuria, dysuria Abdominal:   No nausea, vomiting, diarrhea, bright red blood per rectum, melena, or hematemesis Neurologic:  +Weakness, No visual changes, changes in mental status. All other systems reviewed and are otherwise negative except as noted above.  Physical Exam  Blood pressure 145/38, pulse 41, temperature 98.3 F (36.8 C), temperature source Oral, resp. rate 15, SpO2 98 %.  General: Pleasant, NAD Psych: Normal affect. Neuro: Alert and oriented X 3. Moves all extremities spontaneously. HEENT: Normal  Neck: Supple without bruits or JVD. Lungs:  Resp regular and unlabored, CTA. Heart: RRR no s3, s4, or murmurs. Abdomen: Soft, non-tender, non-distended, BS + x 4.  Extremities: No clubbing, cyanosis or edema. DP/PT/Radials 2+ and equal bilaterally.  Labs  Troponin (Point of Care Test) No results for input(s): TROPIPOC in the last 72 hours.  Recent Labs  09/09/14 2330  TROPONINI <0.03   Lab Results  Component Value Date   WBC 6.0 09/09/2014   HGB 8.2* 09/09/2014   HCT 24.0* 09/09/2014   MCV 97.0 09/09/2014   PLT 111* 09/09/2014    Recent Labs Lab 09/09/14 2330 09/09/14 2336  NA 135 141  K 3.8 3.9  CL 104 105  CO2 23  --   BUN 30* 31*  CREATININE 1.44* 1.40*  CALCIUM 9.2  --   PROT 9.1*  --   BILITOT 0.5  --   ALKPHOS 58  --   ALT 14  --   AST 22  --   GLUCOSE 156* 160*   Lab Results  Component Value Date   CHOL 137 02/09/2014   HDL 27* 02/09/2014   LDLCALC 80 02/09/2014   TRIG 150* 02/09/2014   Lab Results  Component Value Date   DDIMER 0.47 02/05/2012     Radiology/Studies  Ct Head Wo Contrast  09/10/2014   CLINICAL DATA:  Initial evaluation for acute extremity weakness.  EXAM: CT HEAD WITHOUT CONTRAST   TECHNIQUE: Contiguous  axial images were obtained from the base of the skull through the vertex without intravenous contrast.  COMPARISON:  Prior study from 08/13/2014.  FINDINGS: Generalized cerebral atrophy with chronic microvascular ischemic disease present, similar to previous. Prominent vascular calcifications present within the carotid siphons.  No acute large vessel territory infarct. No intracranial hemorrhage. No mass lesion or midline shift. No hydrocephalus. No extra-axial fluid collection.  Scalp soft tissues within normal limits. No acute abnormality about the orbits.  Paranasal sinuses are clear.  No mastoid effusion.  Calvarium intact.  IMPRESSION: 1. No acute intracranial process. 2. Generalized cerebral atrophy with mild chronic microvascular ischemic disease, similar to previous.   Electronically Signed   By: Jeannine Boga M.D.   On: 09/10/2014 02:54   Ct Head Wo Contrast  08/13/2014   CLINICAL DATA:  Fall while standing, weakness  EXAM: CT HEAD WITHOUT CONTRAST  TECHNIQUE: Contiguous axial images were obtained from the base of the skull through the vertex without intravenous contrast.  COMPARISON:  None.  FINDINGS: Bony calvarium is intact. No gross soft tissue abnormality is noted. Paranasal sinuses and mastoid air cells are well aerated. No findings to suggest acute hemorrhage, acute infarction or space-occupying mass lesion are noted. Very mild areas of prior ischemic change are seen. No acute abnormality is noted.  IMPRESSION: Mild chronic white matter ischemic changes noted. No acute abnormality is noted.   Electronically Signed   By: Inez Catalina M.D.   On: 08/13/2014 14:59   Ct Biopsy  08/30/2014   CLINICAL DATA:  Anemia of chronic disease, assess for myeloma.  EXAM: CT GUIDED RIGHT ILIAC BONE MARROW ASPIRATION AND CORE BIOPSY  Date:  5/25/20165/25/2016 9:56 am  Radiologist:  M. Daryll Brod, MD  Guidance:  CT  FLUOROSCOPY TIME:  None.  MEDICATIONS AND MEDICAL HISTORY: 0.5 mg  Versed, 25 mcg fat  ANESTHESIA/SEDATION: 15 minutes  CONTRAST:  None.  COMPLICATIONS: None immediate  PROCEDURE: Informed consent was obtained from the patient following explanation of the procedure, risks, benefits and alternatives. The patient understands, agrees and consents for the procedure. All questions were addressed. A time out was performed.  The patient was positioned prone and noncontrast localization CT was performed of the pelvis to demonstrate the iliac marrow spaces.  Maximal barrier sterile technique utilized including caps, mask, sterile gowns, sterile gloves, large sterile drape, hand hygiene, and betadine prep.  Under sterile conditions and local anesthesia, an 11 gauge coaxial bone biopsy needle was advanced into the right iliac marrow space. Needle position was confirmed with CT imaging. Initially, bone marrow aspiration was performed. Next, the 11 gauge outer cannula was utilized to obtain a right iliac bone marrow core biopsy. Needle was removed. Hemostasis was obtained with compression. The patient tolerated the procedure well. Samples were prepared with the cytotechnologist. No immediate complications.  IMPRESSION: CT guided right iliac bone marrow aspiration and core biopsy.   Electronically Signed   By: Jerilynn Mages.  Shick M.D.   On: 08/30/2014 10:06   Dg Chest Port 1 View  08/15/2014   CLINICAL DATA:  Shortness of breath this morning.  EXAM: PORTABLE CHEST - 1 VIEW  COMPARISON:  08/13/2014  FINDINGS: Sternotomy wires unchanged. Lungs are adequately inflated without focal consolidation or effusion. Mild stable blunting of the left costophrenic angle. Stable borderline cardiomegaly. Remainder of the exam is unchanged.  IMPRESSION: No active disease.   Electronically Signed   By: Marin Olp M.D.   On: 08/15/2014 08:20   Dg Chest [facility] [address]  08/13/2014   CLINICAL DATA:  Per ED note: Per [facility], pt is from home, got up this morning to go to the bathroom and fell from a standing position  Forward to her knees and hands due to weakness. Pt denies injury, no neck or back pain. Pt mildly febrile 100.3, and difficulty urinating for several weeks. Pt also has hx of enemia. Pt has had two or three transfusions. Scheduled for one Friday and they called her to say her hemoglobin was up and she didn't need it. CBG 117. AAOX4, c/o pain below her belly button. 40 in R handH/O CABG X 2 in 2005, COPD, HTN, cardiac cath in 2015.  EXAM: PORTABLE CHEST - 1 VIEW  COMPARISON:  06/22/2014  FINDINGS: Cardiac silhouette is mildly enlarged. Changes from CABG surgery are stable. No mediastinal or hilar masses.  No lung consolidation or edema. No pleural effusion or pneumothorax.  Bony thorax is demineralized but grossly intact.  There are carotid calcifications in the neck.  IMPRESSION: No acute cardiopulmonary disease.   Electronically Signed   By: Lajean Manes M.D.   On: 08/13/2014 12:53   A/P:  81 yo F w a h/o CAD s/p 2vCABG (2005, LIMA-mLAD, SVG-RCA), HTN, HLD, COPD on home PRN O2, Depression, & Multiple myeloma with anemia of chronic disease presents with transient LLE weakness, now resolved, found to have progressive chronotropic insufficiency since 05/2014.  # Progressive chronotropic insufficiency - She is currently HD stable & asymptomatic.  No current evidence of pauses.  This appears to be mostly sinus.  The pt states she has a history of Afib, but I am not seeing this on her prior EKG's or in Dr. Thompson Caul notes.   - Will monitor on telemetry with Atropine at bedside & plan for low-dose Dobutamine if needed for additional support. - NPO after midnight for likely PPM on Monday.  # Transient LLE weakness - Most likely consistent with TIA.  Head CT unremarkable.  Appreciate the input of neurology. - Hold Megace due to increased risk of thrombosis. - f/u MRI, TTE with bubble study (if TTE from 05/2014 did not include a bubble study), Carotid artery dopplers  # Borderline AKI on CKD - May be related to  dehydration with increased BUN:Cr. - Encourage PO intake & monitor Cr following transfusion as to whether additional IVF warranted.    # Anemia/Thrombocytopenia in the setting of MM - Given a single unit of pRBC while in the ED.  No current evidence of bleeding.  Recent GI workup unremarkable.   - Will continue to monitor.  # h/o CAD - No current signs of symptoms of ischemia.   - Continue home ASA.  No AVNB with current bradycardia.  She is intolerant of statin therapy.    # h/o HTN - BP acceptable. - Continue home HCTZ.     #h/o HLD - Continue home Zetia.  # h/o COPD - No current oxygen requirements. - Continue home Spiriva & PRN Combivent.  Signed, Alfonso Ramus, MD   09/10/2014, 5:12 AM

## 2014-09-10 NOTE — Progress Notes (Signed)
STROKE TEAM PROGRESS NOTE   HISTORY [name] is a 81 y.o. female who presents with sudden onset leg weakness this evening. She states that she was walking normally and then tried to lift her leg to get into bed and noticed that she was unable to get her leg up into bed. After this when she tried to walk around she noticed that she was unable to walk normally. She did feel somewhat lightheaded at the time, but it still is clearly just her left leg and not both legs that was feeling weak. She denies back pain, leg pain other than the pain that is been present since her bone marrow biopsies.    SUBJECTIVE (INTERVAL HISTORY) The patient was seen in the emergency department. Her daughter arrived during the visit. The patient's left lower extremity weakness appears to be improving. The MRI of the brain is pending. The patient is having significant bradycardia with rates at times in the 20s. She will no doubt need a permanent pacemaker. Dr. Tamala Julian is her cardiologist. There is a questionable history of atrial fibrillation. She is due to start chemotherapy sometime next week for multiple myeloma. She required a transfusion of one unit packed red blood cells last night.   OBJECTIVE Temp:  [97.8 F (36.6 C)-98.3 F (36.8 C)] 97.8 F (36.6 C) (06/05 1038) Pulse Rate:  [39-89] 89 (06/05 1220) Cardiac Rhythm:  [-] Sinus bradycardia (06/04 2310) Resp:  [13-25] 21 (06/05 1220) BP: (93-182)/(33-77) 178/44 mmHg (06/05 1220) SpO2:  [89 %-100 %] 97 % (06/05 1220)  No results for input(s): GLUCAP in the last 168 hours.  Recent Labs Lab 09/09/14 2330 09/09/14 2336 09/10/14 1009  NA 135 141  --   K 3.8 3.9  --   CL 104 105  --   CO2 23  --   --   GLUCOSE 156* 160*  --   BUN 30* 31*  --   CREATININE 1.44* 1.40* 1.22*  CALCIUM 9.2  --   --     Recent Labs Lab 09/09/14 2330  AST 22  ALT 14  ALKPHOS 58  BILITOT 0.5  PROT 9.1*  ALBUMIN 3.2*    Recent Labs Lab 09/09/14 2330  09/09/14 2336 09/10/14 1009  WBC 6.0  --  6.8  NEUTROABS 4.5  --   --   HGB 7.4* 8.2* 9.3*  HCT 22.4* 24.0* 27.5*  MCV 97.0  --  92.3  PLT 111*  --  97*    Recent Labs Lab 09/09/14 2330  TROPONINI <0.03   No results for input(s): LABPROT, INR in the last 72 hours. No results for input(s): COLORURINE, LABSPEC, [place], GLUCOSEU, HGBUR, BILIRUBINUR, KETONESUR, PROTEINUR, UROBILINOGEN, NITRITE, LEUKOCYTESUR in the last 72 hours.  Invalid input(s): APPERANCEUR     Component Value Date/Time   CHOL 126 09/10/2014 1009   TRIG 213* 09/10/2014 1009   HDL 20* 09/10/2014 1009   CHOLHDL 6.3 09/10/2014 1009   VLDL 43* 09/10/2014 1009   LDLCALC 63 09/10/2014 1009   Lab Results  Component Value Date   HGBA1C 5.9* 02/08/2014   No results found for: LABOPIA, COCAINSCRNUR, LABBENZ, AMPHETMU, THCU, LABBARB  No results for input(s): ETH in the last 168 hours.  Ct Head Wo Contrast 09/10/2014    1. No acute intracranial process.  2. Generalized cerebral atrophy with mild chronic microvascular ischemic disease, similar to previous.     MRI / MRA of the Brain - pending  2D echo pending  CUS pending  PHYSICAL  EXAM  Temp:  [97.8 F (36.6 C)-98.3 F (36.8 C)] 97.8 F (36.6 C) (06/05 1038) Pulse Rate:  [39-89] 89 (06/05 1220) Resp:  [13-25] 21 (06/05 1220) BP: (93-182)/(33-77) 178/44 mmHg (06/05 1220) SpO2:  [89 %-100 %] 97 % (06/05 1220) Weight:  [120 lb 14.4 oz (54.84 kg)] 120 lb 14.4 oz (54.84 kg) (06/05 1220)  General - Well nourished, well developed, in no apparent distress.  Ophthalmologic - Fundi not visualized due to eye movement.  Cardiovascular - Regular rate and rhythm, but bradycardia.  Mental Status -  Level of arousal and orientation to time, place, and person were intact. Language including expression, naming, repetition, comprehension was assessed and found intact.  Cranial Nerves II - XII - II - Visual field intact OU. III, IV, VI - Extraocular movements  intact V - Facial sensation intact bilaterally. VII - Facial movement intact bilaterally. VIII - Hearing & vestibular intact bilaterally. X - Palate elevates symmetrically. XI - Chin turning & shoulder shrug intact bilaterally. XII - Tongue protrusion intact.  Motor Strength - The patient's strength was normal in all extremities except LUE 4/5 distally and pronator drift was absent.  Bulk was normal and fasciculations were absent.   Motor Tone - Muscle tone was assessed at the neck and appendages and was normal.  Reflexes - The patient's reflexes were 1+ in all extremities and she had no pathological reflexes.  Sensory - Light touch, temperature/pinprick were assessed and were symmetrical.    Coordination - The patient had normal movements in the hands with no ataxia or dysmetria.  Tremor was absent.  Gait and Station - not tested due to safety concerns.   ASSESSMENT/PLAN [name] is a 81 y.o. female with history of COPD on home O2, CAD and previous MI s/p CABG in 2005, HTN, HLD, multiple myeloma pending chemotherapy next week, anemia requiring blood transfusion, and a questionable history of atrial fibrillation presenting with left lower extremity weakness. She did not receive IV t-PA due to mild deficits.   Stroke:  Likely right ACA territory. MRI pending.    Resultant  left lower extremity weakness  MRI  pending  MRA  pending  Carotid Doppler pending  2D Echo  Pending ( patient had a transthoracic echo in February 2016)  LDL 63  HgbA1c pending  Lovenox for VTE prophylaxis Diet Heart Room service appropriate?: Yes; Fluid consistency:: Thin  aspirin 325 mg orally every day prior to admission, now on aspirin 325 mg orally every day  Patient counseled to be compliant with her antithrombotic medications  Ongoing aggressive stroke risk factor management  Therapy recommendations: Pending  Disposition: Pending  Bradycardia   Cardiology on board  Plan for  pacemaker on Monday  Questionable paroxysmal A. Fib  Patient stated that she had paroxysmal A. Fib  However, this was not documented in cardiology note  Will able to monitor if patient has pacemaker placed on Monday  Hypertension  Home meds: Hydrochlorothiazide  Stable  Hyperlipidemia  Home meds:  Zetia 10 mg daily resumed in hospital  LDL 63, goal < 70  Continue Zetia at discharge  Other Stroke Risk Factors  Advanced age  Cigarette smoker, quit smoking in 2012  ETOH use - occasional glass of wine  Coronary artery disease s/p CABG  Other Active Problems  Multiple myeloma pending chemotherapy next week  Anemia due to MM requiring transfusion  Mild Thrombocytopenia  Renal insufficiency  Other Pertinent History    Hospital day # 0  Mikey Bussing PA-C [facility] Pager [phone] 09/10/2014, 12:30 PM  I, the attending vascular neurologist, have personally obtained a history, examined the patient, evaluated laboratory data, individually viewed imaging studies and agree with radiology interpretations. I obtained additional history from pt's daughter at bedside. Together with the NP/PA, we formulated the assessment and plan of care which reflects our mutual decision.  I have made any additions or clarifications directly to the above note and agree with the findings and plan as currently documented.   81 year old female with history of CAD, MI, s/p CABG, multiple myeloma pending chemotherapy next week, bradycardia, and questionable paroxysmal A. fib presented with left lower extremity weakness which has been improved. MRI is pending, concerning for right ACA infarct. Etiology and clear, however, if patient has pacemaker placed on Monday, we will be able to monitor. Carotid Doppler and 2-D Echo pending, due to anemia requiring blood transfusion, patient may not be a good candidate for anticoagulation. Continue aspirin for now.  Rosalin Hawking, MD  PhD Stroke Neurology 09/10/2014 1:47 PM    To contact Stroke Continuity provider, please refer to [email or web address] After hours, contact General Neurology

## 2014-09-11 ENCOUNTER — Inpatient Hospital Stay (HOSPITAL_COMMUNITY): Payer: Medicare Other

## 2014-09-11 ENCOUNTER — Telehealth: Payer: Self-pay | Admitting: *Deleted

## 2014-09-11 DIAGNOSIS — G451 Carotid artery syndrome (hemispheric): Secondary | ICD-10-CM

## 2014-09-11 LAB — CBC
HCT: 27.1 % — ABNORMAL LOW (ref 36.0–46.0)
Hemoglobin: 9.2 g/dL — ABNORMAL LOW (ref 12.0–15.0)
MCH: 31.5 pg (ref 26.0–34.0)
MCHC: 33.9 g/dL (ref 30.0–36.0)
MCV: 92.8 fL (ref 78.0–100.0)
Platelets: 89 10*3/uL — ABNORMAL LOW (ref 150–400)
RBC: 2.92 MIL/uL — ABNORMAL LOW (ref 3.87–5.11)
RDW: 21.6 % — ABNORMAL HIGH (ref 11.5–15.5)
WBC: 2.8 10*3/uL — ABNORMAL LOW (ref 4.0–10.5)

## 2014-09-11 LAB — BASIC METABOLIC PANEL
Anion gap: 5 (ref 5–15)
BUN: 23 mg/dL — ABNORMAL HIGH (ref 6–20)
CO2: 24 mmol/L (ref 22–32)
Calcium: 9 mg/dL (ref 8.9–10.3)
Chloride: 104 mmol/L (ref 101–111)
Creatinine, Ser: 1.21 mg/dL — ABNORMAL HIGH (ref 0.44–1.00)
GFR calc Af Amer: 47 mL/min — ABNORMAL LOW (ref >60)
GFR calc non Af Amer: 41 mL/min — ABNORMAL LOW (ref >60)
Glucose, Bld: 102 mg/dL — ABNORMAL HIGH (ref 65–99)
Potassium: 3.3 mmol/L — ABNORMAL LOW (ref 3.5–5.1)
Sodium: 133 mmol/L — ABNORMAL LOW (ref 135–145)

## 2014-09-11 LAB — TISSUE HYBRIDIZATION (BONE MARROW)-NCBH

## 2014-09-11 LAB — TYPE AND SCREEN
ABO/RH(D): A NEG
Antibody Screen: NEGATIVE
Unit division: 0

## 2014-09-11 LAB — CHROMOSOME ANALYSIS, BONE MARROW

## 2014-09-11 LAB — HEMOGLOBIN A1C
Hgb A1c MFr Bld: 5.6 % (ref 4.8–5.6)
Mean Plasma Glucose: 114 mg/dL

## 2014-09-11 MED ORDER — ENSURE ENLIVE PO LIQD
237.0000 mL | Freq: Three times a day (TID) | ORAL | Status: DC
  Administered 2014-09-11 – 2014-09-13 (×6): 237 mL via ORAL

## 2014-09-11 MED ORDER — TRAMADOL HCL 50 MG PO TABS
50.0000 mg | ORAL_TABLET | Freq: Four times a day (QID) | ORAL | Status: DC | PRN
  Administered 2014-09-11: 50 mg via ORAL
  Filled 2014-09-11: qty 1

## 2014-09-11 MED ORDER — ONDANSETRON HCL 4 MG/2ML IJ SOLN: 4.0000 mg | Freq: Four times a day (QID) | INTRAMUSCULAR | Status: DC | PRN

## 2014-09-11 MED ORDER — BISACODYL 10 MG RE SUPP
10.0000 mg | Freq: Every day | RECTAL | Status: DC | PRN
Start: 2014-09-11 — End: 2014-09-12
  Administered 2014-09-11: 10 mg via RECTAL
  Filled 2014-09-11: qty 1

## 2014-09-11 NOTE — Progress Notes (Signed)
Utilization review complete. Susa Bones RN CCM Case Mgmt phone [phone] 

## 2014-09-11 NOTE — Progress Notes (Addendum)
Initial Nutrition Assessment  DOCUMENTATION CODES:  Severe malnutrition in context of chronic illness  INTERVENTION:   Ensure Enlive PO TID (each supplement provides 350kcal and 20 grams of protein)  NUTRITION DIAGNOSIS:  Malnutrition related to chronic illness as evidenced by energy intake < or equal to 75% for > or equal to 1 month, percent weight loss (20% weight loss in 6 months).  GOAL:  Patient will meet greater than or equal to 90% of their needs  MONITOR:  PO intake, Supplement acceptance, Labs, Weight trends  REASON FOR ASSESSMENT:  Malnutrition Screening Tool    ASSESSMENT:  Patient admitted 6/4 with sudden onset leg weakness. CT of the head showed no acute intracranial process, generalized cerebral atrophy. MRI of the head showed no acute infarct.   Patient reports "no appetite" and poor intake for the past several months. She likes chocolate Ensure, drinks it at home. She has consumed </= 50% of meals since admission. Nutrition-Focused physical exam completed. Findings are mild-moderate fat depletion, mild-moderate muscle depletion, and no edema.   Labs reviewed: sodium and potassium are low.  Height:  Ht Readings from Last 1 Encounters:  09/10/14 5\' 1"  (1.549 m)    Weight:  Wt Readings from Last 1 Encounters:  09/10/14 120 lb 14.4 oz (54.84 kg)    Ideal Body Weight:  47.7 kg  Wt Readings from Last 10 Encounters:  09/10/14 120 lb 14.4 oz (54.84 kg)  09/07/14 125 lb 1.6 oz (56.745 kg)  08/24/14 128 lb 11.2 oz (58.378 kg)  08/13/14 132 lb (59.875 kg)  08/09/14 128 lb 8 oz (58.287 kg)  07/20/14 132 lb 1.6 oz (59.92 kg)  06/02/14 139 lb (63.05 kg)  05/25/14 136 lb (61.689 kg)  04/18/14 148 lb (67.132 kg)  03/09/14 151 lb (68.493 kg)    BMI:  Body mass index is 22.86 kg/(m^2).  Estimated Nutritional Needs:  Kcal:  1400-1600  Protein:  70-80 gm  Fluid:  >/= 1.5 L  Skin:  Reviewed, no issues  Diet Order:  Diet Heart Room service  appropriate?: Yes; Fluid consistency:: Thin  EDUCATION NEEDS:  Education needs addressed   Intake/Output Summary (Last 24 hours) at 09/11/14 1534 Last data filed at 09/11/14 0914  Gross per 24 hour  Intake     75 ml  Output    450 ml  Net   -375 ml    Last BM:  6/4   Molli Barrows, RD, LDN, [place] Pager [phone] After Hours Pager [phone]

## 2014-09-11 NOTE — Progress Notes (Signed)
Pt BP 88/46 manual, pt asymptomatic. Kerin Ransom, [place] notified.  No new orders received.  Will continue to closely monitor.

## 2014-09-11 NOTE — Telephone Encounter (Signed)
Daughter reports pt is in hospital.  She needs to cancel chemo class tomorrow and likely will not be able to make it for lab/chemo to start this Friday 6/10.   She wanted Dr. Alen Blew to be aware pt in hospital.  Dtr called 911 b/c she thought pt had a stroke but now she is being told it may be her heart and she might need a pacemaker.   Daughter is worried pt is "too weak" to start chemo this week.  Will notify Dr. Alen Blew to r/s if needed.  Asked her to call us back by Thursday if she has not heard anything and I will cancel chemo class tomorrow. She verbalized understanding.

## 2014-09-11 NOTE — Progress Notes (Signed)
STROKE TEAM PROGRESS NOTE   HISTORY [name] is a 81 y.o. female who presents with sudden onset leg weakness this evening. She states that she was walking normally and then tried to lift her leg to get into bed and noticed that she was unable to get her leg up into bed. After this when she tried to walk around she noticed that she was unable to walk normally. She did feel somewhat lightheaded at the time, but it still is clearly just her left leg and not both legs that was feeling weak. She denies back pain, leg pain other than the pain that is been present since her bone marrow biopsies.    SUBJECTIVE (INTERVAL HISTORY) Daughter at bedside. Pt states she is just waiting to hear the plans for today - she does not know any specifics about pacer plan. Her heart rate still low.   OBJECTIVE Temp:  [97.7 F (36.5 C)-100 F (37.8 C)] 100 F (37.8 C) (06/06 0744) Pulse Rate:  [26-89] 77 (06/06 0744) Cardiac Rhythm:  [-] Heart block (06/06 0745) Resp:  [13-25] 13 (06/06 0744) BP: (114-182)/(34-77) 114/70 mmHg (06/06 0744) SpO2:  [94 %-99 %] 99 % (06/06 0744) Weight:  [54.84 kg (120 lb 14.4 oz)] 54.84 kg (120 lb 14.4 oz) (06/05 1220)  No results for input(s): GLUCAP in the last 168 hours.  Recent Labs Lab 09/09/14 2330 09/09/14 2336 09/10/14 1009  NA 135 141  --   K 3.8 3.9  --   CL 104 105  --   CO2 23  --   --   GLUCOSE 156* 160*  --   BUN 30* 31*  --   CREATININE 1.44* 1.40* 1.22*  CALCIUM 9.2  --   --     Recent Labs Lab 09/09/14 2330  AST 22  ALT 14  ALKPHOS 58  BILITOT 0.5  PROT 9.1*  ALBUMIN 3.2*    Recent Labs Lab 09/09/14 2330 09/09/14 2336 09/10/14 1009  WBC 6.0  --  6.8  NEUTROABS 4.5  --   --   HGB 7.4* 8.2* 9.3*  HCT 22.4* 24.0* 27.5*  MCV 97.0  --  92.3  PLT 111*  --  97*    Recent Labs Lab 09/09/14 2330  TROPONINI <0.03   No results for input(s): LABPROT, INR in the last 72 hours. No results for input(s): COLORURINE, LABSPEC, [place],  GLUCOSEU, HGBUR, BILIRUBINUR, KETONESUR, PROTEINUR, UROBILINOGEN, NITRITE, LEUKOCYTESUR in the last 72 hours.  Invalid input(s): APPERANCEUR     Component Value Date/Time   CHOL 126 09/10/2014 1009   TRIG 213* 09/10/2014 1009   HDL 20* 09/10/2014 1009   CHOLHDL 6.3 09/10/2014 1009   VLDL 43* 09/10/2014 1009   LDLCALC 63 09/10/2014 1009   Lab Results  Component Value Date   HGBA1C 5.9* 02/08/2014   No results found for: LABOPIA, COCAINSCRNUR, LABBENZ, AMPHETMU, THCU, LABBARB  No results for input(s): ETH in the last 168 hours.  Ct Head Wo Contrast 09/10/2014    1. No acute intracranial process.  2. Generalized cerebral atrophy with mild chronic microvascular ischemic disease, similar to previous.     Mri & Mra Head 09/10/2014   Exam is motion degraded.  No acute infarct.  Small vessel disease type changes.  Bone marrow changes suggestive of result of underlying anemia.  Prominent intracranial atherosclerotic type changes.  2D echo pending   CUS pending  LE venous doppler - pending   PHYSICAL EXAM Temp:  [97.7 F (36.5 C)-100  F (37.8 C)] 100 F (37.8 C) (06/06 0744) Pulse Rate:  [26-89] 77 (06/06 0744) Resp:  [13-25] 13 (06/06 0744) BP: (114-182)/(34-77) 114/70 mmHg (06/06 0744) SpO2:  [94 %-99 %] 99 % (06/06 0744) Weight:  [54.84 kg (120 lb 14.4 oz)] 54.84 kg (120 lb 14.4 oz) (06/05 1220)  General - Well nourished, well developed, in no apparent distress.  Ophthalmologic - Fundi not visualized due to eye movement.  Cardiovascular - irregular heart rhythm seems with premature beats.  Mental Status -  Level of arousal and orientation to time, place, and person were intact. Language including expression, naming, repetition, comprehension was assessed and found intact.  Cranial Nerves II - XII - II - Visual field intact OU. III, IV, VI - Extraocular movements intact V - Facial sensation intact bilaterally. VII - Facial movement intact bilaterally. VIII - Hearing &  vestibular intact bilaterally. X - Palate elevates symmetrically. XI - Chin turning & shoulder shrug intact bilaterally. XII - Tongue protrusion intact.  Motor Strength - The patient's strength was normal in all extremities and pronator drift was absent.  Bulk was normal and fasciculations were absent.   Motor Tone - Muscle tone was assessed at the neck and appendages and was normal.  Reflexes - The patient's reflexes were 1+ in all extremities and she had no pathological reflexes.  Sensory - Light touch, temperature/pinprick were assessed and were symmetrical.    Coordination - The patient had normal movements in the hands with no ataxia or dysmetria.  Tremor was absent.  Gait and Station - not tested due to safety concerns.   ASSESSMENT/PLAN [name] is a 81 y.o. female with history of COPD on home O2, CAD and previous MI s/p CABG in 2005, HTN, HLD, multiple myeloma pending chemotherapy next week, anemia requiring blood transfusion, and a questionable history of atrial fibrillation presenting with left lower extremity weakness. She did not receive IV t-PA due to mild deficits.   R hemisphere TIA    Resultant  left lower extremity weakness resolved  MRI  No acute stroke  MRA  No large vessel stenosis. small vessel disease   Carotid Doppler pending  2D Echo  Pending ( patient had a transthoracic echo in February 2016)  LE venous Doppler pending  LDL 63  HgbA1c pending  Lovenox for VTE prophylaxis Diet Heart Room service appropriate?: Yes; Fluid consistency:: Thin  aspirin 325 mg orally every day prior to admission, now on aspirin 325 mg orally every day  Patient counseled to be compliant with her antithrombotic medications  Ongoing aggressive stroke risk factor management  Therapy recommendations: Pending. Pt currently on BR  Disposition: Pending  Bradycardia   Cardiology on board  HR still low today with occasions to 26, 29, 33  Consider pacemaker  placement  Questionable paroxysmal A. Fib  Patient stated that she had paroxysmal A. Fib  It seems that on tele 02/2014, she does has afib with RVR - need cardiology confirmation  However, this was not documented in cardiology note  Will able to monitor if patient has pacemaker placed. If no pacer needed, recommend 30-day OP monitor to look for atrial fibrillation as cause of stroke.  Hypertension  Home meds: Hydrochlorothiazide  Stable  Hyperlipidemia  Home meds:  Zetia 10 mg daily resumed in hospital  LDL 63, goal < 70  Continue Zetia at discharge  Other Stroke Risk Factors  Advanced age  Cigarette smoker, quit smoking in 2012  ETOH use - occasional glass  of wine  Coronary artery disease s/p CABG  Other Active Problems  Multiple myeloma pending chemotherapy next week  Anemia due to MM requiring transfusion  Mild Thrombocytopenia  Renal insufficiency  Ronaldo Miyamoto [facility] [place] for Pager information 09/11/2014 9:52 AM   I, the attending vascular neurologist, have personally obtained a history, examined the patient, evaluated laboratory data, individually viewed imaging studies and agree with radiology interpretations. I also discussed with pt and daughter regarding her care plan. Together with the NP/PA, we formulated the assessment and plan of care which reflects our mutual decision.  I have made any additions or clarifications directly to the above note and agree with the findings and plan as currently documented.   81 year old female with multiple medical conditions including CAD s/p CABG and stent, MM with anemia requiring blood transfusion, COPD on home accident, HTN, HLD admitted for right hemispheric TIA with left lower extremity weakness. Weakness resolved, MRI negative. However, patient does have bradycardia, cardiology on board and plan for pacemaker. By reviewing November 2015 telemetry strip, it seems she had A. fib with RVR at the  time. Would like cardiology to confirm. Also if patient had pacemaker placed, she can also be monitored for A. Fib. Even if A. fib confirmed, it is still dilemma to consider anticoagulation due to patient anemia requiring blood transfusion. Continue aspirin for stroke prevention for now.   Rosalin Hawking, MD PhD Stroke Neurology 09/11/2014 4:36 PM   To contact Stroke Continuity provider, please refer to [email or web address] After hours, contact General Neurology

## 2014-09-11 NOTE — Progress Notes (Signed)
  Echocardiogram 2D Echocardiogram has been performed.  [name] 09/11/2014, 11:15 AM

## 2014-09-11 NOTE — Progress Notes (Signed)
Subjective:  Denies SSCP, palpitations or Dyspnea Not clear to me why she was admitted by cardiology  Primary complaint was leg weakness ? TIA and anemia  Objective:  Filed Vitals:   09/11/14 0100 09/11/14 0350 09/11/14 0400 09/11/14 0744  BP: 124/34 151/45  114/70  Pulse: 45 35  77  Temp:   99.6 F (37.6 C) 100 F (37.8 C)  TempSrc:   Oral   Resp: 16 19  13   Height:      Weight:      SpO2: 97% 98%  99%    Intake/Output from previous day:  Intake/Output Summary (Last 24 hours) at 09/11/14 1020 Last data filed at 09/11/14 0914  Gross per 24 hour  Intake     78 ml  Output    450 ml  Net   -372 ml    Physical Exam: Affect appropriate Frail chronically ill white female  HEENT: normal Neck supple with no adenopathy JVP normal no bruits no thyromegaly Lungs clear with no wheezing and good diaphragmatic motion Heart:  S1/S2 SEM murmur, no rub, gallop or click PMI normal Abdomen: benighn, BS positve, no tenderness, no AAA no bruit.  No HSM or HJR Distal pulses intact with no bruits No edema Neuro non-focal Skin warm and dry No muscular weakness   Lab Results: Basic Metabolic Panel:  Recent Labs  09/09/14 2330 09/09/14 2336 09/10/14 1009  NA 135 141  --   K 3.8 3.9  --   CL 104 105  --   CO2 23  --   --   GLUCOSE 156* 160*  --   BUN 30* 31*  --   CREATININE 1.44* 1.40* 1.22*  CALCIUM 9.2  --   --    Liver Function Tests:  Recent Labs  09/09/14 2330  AST 22  ALT 14  ALKPHOS 58  BILITOT 0.5  PROT 9.1*  ALBUMIN 3.2*   No results for input(s): LIPASE, AMYLASE in the last 72 hours. CBC:  Recent Labs  09/09/14 2330 09/09/14 2336 09/10/14 1009  WBC 6.0  --  6.8  NEUTROABS 4.5  --   --   HGB 7.4* 8.2* 9.3*  HCT 22.4* 24.0* 27.5*  MCV 97.0  --  92.3  PLT 111*  --  97*   Cardiac Enzymes:  Recent Labs  09/09/14 2330  TROPONINI <0.03     Recent Labs  09/10/14 1009  CHOL 126  HDL 20*  LDLCALC 63  TRIG 213*  CHOLHDL 6.3     Imaging: Ct Head Wo Contrast  09/10/2014   CLINICAL DATA:  Initial evaluation for acute extremity weakness.  EXAM: CT HEAD WITHOUT CONTRAST  TECHNIQUE: Contiguous axial images were obtained from the base of the skull through the vertex without intravenous contrast.  COMPARISON:  Prior study from 08/13/2014.  FINDINGS: Generalized cerebral atrophy with chronic microvascular ischemic disease present, similar to previous. Prominent vascular calcifications present within the carotid siphons.  No acute large vessel territory infarct. No intracranial hemorrhage. No mass lesion or midline shift. No hydrocephalus. No extra-axial fluid collection.  Scalp soft tissues within normal limits. No acute abnormality about the orbits.  Paranasal sinuses are clear.  No mastoid effusion.  Calvarium intact.  IMPRESSION: 1. No acute intracranial process. 2. Generalized cerebral atrophy with mild chronic microvascular ischemic disease, similar to previous.   Electronically Signed   By: Jeannine Boga M.D.   On: 09/10/2014 02:54   Mr Brain Wo Contrast  09/10/2014   CLINICAL  DATA:  81 year old hypertensive female with sudden onset of left leg weakness. Subsequent encounter.  EXAM: MRI HEAD WITHOUT CONTRAST  MRA HEAD WITHOUT CONTRAST  TECHNIQUE: Multiplanar, multiecho pulse sequences of the brain and surrounding structures were obtained without intravenous contrast. Angiographic images of the head were obtained using MRA technique without contrast.  COMPARISON:  09/10/2014 head CT.  05/24/2014 brain MR.  FINDINGS: MRI HEAD FINDINGS  Exam is motion degraded.  No acute infarct.  No intracranial hemorrhage.  Mild small vessel disease type changes.  Global atrophy without hydrocephalus.  No intracranial mass lesion noted on this unenhanced exam.  Heterogeneous bone marrow unchanged. This may be related to patient's underlying anemia.  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  Mild to slight  moderate narrowing distal right vertebral artery.  Moderate narrowing mid basilar artery.  Nonvisualized posterior inferior cerebellar arteries.  Mild narrowing and irregularity of portions of the posterior cerebral arteries bilaterally.  Moderate narrowing of portions of the cavernous segment and supraclinoid segment of the internal carotid arteries greater on the right. Associated areas of ectasia without saccular aneurysm.  Mild narrowing and irregularity M1 segment left middle cerebral artery. Mild narrowing right middle cerebral artery bifurcation. Moderate narrowing middle cerebral artery branch vessels bilaterally.  Mild narrowing and irregularity A1 segment anterior cerebral artery bilaterally.  IMPRESSION: Exam is motion degraded.  No acute infarct.  Small vessel disease type changes.  Bone marrow changes suggestive of result of underlying anemia.  Prominent intracranial atherosclerotic type changes as detailed above.   Electronically Signed   By: Genia Del M.D.   On: 09/10/2014 15:31   [name]  09/10/2014   CLINICAL DATA:  81 year old hypertensive female with sudden onset of left leg weakness. Subsequent encounter.  EXAM: MRI HEAD WITHOUT CONTRAST  MRA HEAD WITHOUT CONTRAST  TECHNIQUE: Multiplanar, multiecho pulse sequences of the brain and surrounding structures were obtained without intravenous contrast. Angiographic images of the head were obtained using MRA technique without contrast.  COMPARISON:  09/10/2014 head CT.  05/24/2014 brain MR.  FINDINGS: MRI HEAD FINDINGS  Exam is motion degraded.  No acute infarct.  No intracranial hemorrhage.  Mild small vessel disease type changes.  Global atrophy without hydrocephalus.  No intracranial mass lesion noted on this unenhanced exam.  Heterogeneous bone marrow unchanged. This may be related to patient's underlying anemia.  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  Mild to slight moderate narrowing distal right  vertebral artery.  Moderate narrowing mid basilar artery.  Nonvisualized posterior inferior cerebellar arteries.  Mild narrowing and irregularity of portions of the posterior cerebral arteries bilaterally.  Moderate narrowing of portions of the cavernous segment and supraclinoid segment of the internal carotid arteries greater on the right. Associated areas of ectasia without saccular aneurysm.  Mild narrowing and irregularity M1 segment left middle cerebral artery. Mild narrowing right middle cerebral artery bifurcation. Moderate narrowing middle cerebral artery branch vessels bilaterally.  Mild narrowing and irregularity A1 segment anterior cerebral artery bilaterally.  IMPRESSION: Exam is motion degraded.  No acute infarct.  Small vessel disease type changes.  Bone marrow changes suggestive of result of underlying anemia.  Prominent intracranial atherosclerotic type changes as detailed above.   Electronically Signed   By: Genia Del M.D.   On: 09/10/2014 15:31    Cardiac Studies:  ECG:  Reviewed 3 ECGs  SB rates 47-53  PR 233 no AV block   Telemetry:  SB  no long pauses heart block or PAF  Echo:  05/21/14  Study Conclusions  - Left ventricle: Posterior basal hypokinesis The cavity size was normal. Wall thickness was normal. Systolic function was normal. The estimated ejection fraction was in the range of 50% to 55%. Doppler parameters are consistent with both elevated ventricular end-diastolic filling pressure and elevated left atrial filling pressure. - Left atrium: The atrium was mildly dilated. - Atrial septum: No defect or patent foramen ovale was identified. - Pulmonary arteries: PA peak pressure: 37 mm Hg (S).  Medications:   . aspirin  325 mg Oral q morning - 10a  . aspirin  300 mg Rectal Daily  . enoxaparin (LOVENOX) injection  30 mg Subcutaneous Q24H  . ezetimibe  10 mg Oral QHS  . hydrochlorothiazide  25 mg Oral q morning - 10a  . pantoprazole  40 mg Oral BID  .  senna  2 tablet Oral QHS  . sodium chloride  3 mL Intravenous Q12H       Assessment/Plan:  Bradycardia:  Cannot find strips from ER ? HR in 20s, 30's  Telemetry stable and rhythm not related to her "TIA" symptoms.  No indication for pacer Avoid AV nodal blocking drugs  Anemia: presumed from myeloma transfuse    Myeloma  Chemo next week  TIA:  RLE weakness gone  MRI negative neuro seeing  Transfer to medical service  Outpatient f/u Dr Marnee Guarneri [facility] 09/11/2014, 10:20 AM

## 2014-09-11 NOTE — Progress Notes (Signed)
PT Cancellation Note  Patient Details Name: [name] MRN: [id number] DOB: 05-15-1931   Cancelled Treatment:    Reason Eval/Treat Not Completed: Patient not medically ready.  Pt currently on strict bedrest and RN noting 2nd degree Heart Block overnight.  Please update activity orders when appropriate for mobility.     Clifton Kovacic, Thornton Papas 09/11/2014, 8:02 AM

## 2014-09-11 NOTE — Evaluation (Signed)
Occupational Therapy Evaluation Patient Details Name: [name] MRN: [id number] DOB: 1931-11-08 Today's Date: 09/11/2014    History of Present Illness pt presents with L LE weakness and HTN.  pt with hx of Bradycardia, multiple Myeloma.     Clinical Impression   Pt was independent in self care with assistive devices prior to admission.  She had not returned to housekeeping, cooking or drivingsince her bone marrow biopsy on 5/25. Pt presents with generalized weakness and mild balance deficit.  She is currently experiencing hypotension so session was limited as she was symptomatic upon sitting up.  Reinforced walker use when she returns home and recommended pt shower only when daughter is home.  Pt is concerned she will become a burden to her family. Will follow acutely.    Follow Up Recommendations  No OT follow up;Supervision - Intermittent    Equipment Recommendations  None recommended by OT    Recommendations for Other Services       Precautions / Restrictions Precautions Precautions: Fall Restrictions Weight Bearing Restrictions: No      Mobility Bed Mobility Overal bed mobility: Modified Independent                Transfers Overall transfer level: Needs assistance Equipment used: None Transfers: Sit to/from Stand Sit to Stand: Supervision         General transfer comment: supervision due to lightheaded feeling and weakness with pt experiencing hypotension.    Balance Overall balance assessment: History of Falls;Needs assistance Sitting-balance support: No upper extremity supported;Feet supported Sitting balance-Leahy Scale: Fair     Standing balance support: No upper extremity supported;During functional activity Standing balance-Leahy Scale: Fair                              ADL Overall ADL's : Needs assistance/impaired Eating/Feeding: Independent;Sitting Eating/Feeding Details (indicate cue type and reason): drank Ensure at  EOB Grooming: Wash/dry hands;Sitting;Set up Grooming Details (indicate cue type and reason): after toileting Upper Body Bathing: Set up;Sitting   Lower Body Bathing: Supervison/ safety;Sit to/from stand   Upper Body Dressing : Set up;Sitting   Lower Body Dressing: Supervision/safety;Sit to/from stand   Toilet Transfer: Psychologist, counselling Details (indicate cue type and reason): avoided bathroom due to hypotension Toileting- Clothing Manipulation and Hygiene: Supervision/safety;Sit to/from stand               Vision     Perception     Praxis      Pertinent Vitals/Pain Pain Assessment: No/denies pain     Hand Dominance Right   Extremity/Trunk Assessment Upper Extremity Assessment Upper Extremity Assessment: Generalized weakness   Lower Extremity Assessment Lower Extremity Assessment: Generalized weakness   Cervical / Trunk Assessment Cervical / Trunk Assessment: Normal   Communication Communication Communication: No difficulties   Cognition Arousal/Alertness: Awake/alert Behavior During Therapy: WFL for tasks assessed/performed Overall Cognitive Status: Within Functional Limits for tasks assessed                     General Comments       Exercises       Shoulder Instructions      Home Living Family/patient expects to be discharged to:: Private residence Living Arrangements: Children Available Help at Discharge: Family;Available PRN/intermittently Type of Home: Mobile home Home Access: Stairs to enter Entrance Stairs-Number of Steps: 4 Entrance Stairs-Rails: Right;Left Home Layout: One level     Bathroom Shower/Tub: Tub/shower unit  Bathroom Toilet: Standard     Home Equipment: Environmental consultant - 2 wheels;Tub bench   Additional Comments: pt indicates her family works during the day.        Prior Functioning/Environment Level of Independence: Independent with assistive device(s)        Comments: Pt has  not resumed driving and housekeeping since her bone marrow biopsy on 5/25.    OT Diagnosis: Generalized weakness   OT Problem List: Decreased strength;Decreased activity tolerance;Impaired balance (sitting and/or standing)   OT Treatment/Interventions: Self-care/ADL training;Patient/family education    OT Goals(Current goals can be found in the care plan section) Acute Rehab OT Goals Patient Stated Goal: go home OT Goal Formulation: With patient Time For Goal Achievement: 09/18/14 Potential to Achieve Goals: Good ADL Goals Pt Will Perform Grooming: with modified independence;standing Pt Will Transfer to Toilet: with modified independence;ambulating;regular height toilet Pt Will Perform Toileting - Clothing Manipulation and hygiene: with modified independence;sit to/from stand Pt Will Perform Tub/Shower Transfer: Tub transfer;with supervision;ambulating;shower seat;rolling walker  OT Frequency: Min 2X/week   Barriers to D/C:            Co-evaluation              End of Session    Activity Tolerance: Treatment limited secondary to medical complications (Comment) (hypotensive, symptomatic) Patient left: in bed;with call bell/phone within reach;with family/visitor present   Time: 5947-0761 OT Time Calculation (min): 17 min Charges:  OT General Charges $OT Visit: 1 Procedure OT Evaluation $Initial OT Evaluation Tier I: 1 Procedure G-Codes:    Malka So 09/11/2014, 4:10 PM  [phone]

## 2014-09-11 NOTE — Evaluation (Signed)
Physical Therapy Evaluation Patient Details Name: [name] MRN: [id number] DOB: 1931-06-10 Today's Date: 09/11/2014   History of Present Illness  pt presents with L LE weakness and HTN.  pt with hx of Bradycardia, multiple Myeloma.    Clinical Impression  Pt indicates she has never had L LE weakness and that her daughter was "just getting worked up".  Pt indicates she does not wear O2 at home and during session O2 sats remained 98-99% on RA.  Did encourage pt to start using RW for balance and safety, and also discussed HHPT for continued work on balance.  Pt agreeable at this time.  Will continue to follow if remains on acute.      Follow Up Recommendations Home health PT;Supervision - Intermittent    Equipment Recommendations  None recommended by PT    Recommendations for Other Services       Precautions / Restrictions Precautions Precautions: Fall Restrictions Weight Bearing Restrictions: No      Mobility  Bed Mobility Overal bed mobility: Modified Independent                Transfers Overall transfer level: Modified independent Equipment used: Rolling walker (2 wheeled)             General transfer comment: pt with definite use of UEs, but demonstrates good technique.    Ambulation/Gait Ambulation/Gait assistance: Supervision Ambulation Distance (Feet): 80 Feet Assistive device: Rolling walker (2 wheeled) Gait Pattern/deviations: Step-through pattern;Decreased stride length     General Gait Details: pt fatigues easily and needs cues for RW use on turns.    Stairs            Wheelchair Mobility    Modified Rankin (Stroke Patients Only) Modified Rankin (Stroke Patients Only) Pre-Morbid Rankin Score: Slight disability Modified Rankin: Moderate disability     Balance Overall balance assessment: History of Falls;Needs assistance Sitting-balance support: No upper extremity supported;Feet supported Sitting balance-Leahy Scale: Fair      Standing balance support: No upper extremity supported;During functional activity Standing balance-Leahy Scale: Fair                               Pertinent Vitals/Pain Pain Assessment: No/denies pain    Home Living Family/patient expects to be discharged to:: Private residence Living Arrangements: Children Available Help at Discharge: Family;Available PRN/intermittently Type of Home: Mobile home Home Access: Stairs to enter Entrance Stairs-Rails: Right;Left Entrance Stairs-Number of Steps: 4 Home Layout: One level Home Equipment: Walker - 2 wheels;Tub bench Additional Comments: pt indicates her family works during the day.      Prior Function Level of Independence: Independent               Hand Dominance   Dominant Hand: Right    Extremity/Trunk Assessment   Upper Extremity Assessment: Defer to OT evaluation           Lower Extremity Assessment: Generalized weakness      Cervical / Trunk Assessment: Normal  Communication   Communication: No difficulties  Cognition Arousal/Alertness: Awake/alert Behavior During Therapy: WFL for tasks assessed/performed Overall Cognitive Status: Within Functional Limits for tasks assessed                      General Comments      Exercises        Assessment/Plan    PT Assessment Patient needs continued PT services  PT Diagnosis Difficulty walking  PT Problem List Decreased strength;Decreased activity tolerance;Decreased balance;Decreased mobility;Decreased knowledge of use of DME  PT Treatment Interventions DME instruction;Gait training;Stair training;Functional mobility training;Therapeutic activities;Therapeutic exercise;Balance training;Neuromuscular re-education;Patient/family education   PT Goals (Current goals can be found in the Care Plan section) Acute Rehab PT Goals Patient Stated Goal: Home today PT Goal Formulation: With patient Time For Goal Achievement: 09/18/14 Potential to  Achieve Goals: Good    Frequency Min 3X/week   Barriers to discharge Decreased caregiver support      Co-evaluation               End of Session Equipment Utilized During Treatment: Gait belt Activity Tolerance: Patient limited by fatigue Patient left: in bed;with call bell/phone within reach Nurse Communication: Mobility status         Time: 4961-1643 PT Time Calculation (min) (ACUTE ONLY): 21 min   Charges:   PT Evaluation $Initial PT Evaluation Tier I: 1 Procedure     PT G CodesCatarina Hartshorn, [place] 09/11/2014, 2:44 PM

## 2014-09-11 NOTE — Progress Notes (Signed)
SLP Cancellation Note  Patient Details Name: [name] MRN: [id number] DOB: 1931/07/11   Cancelled treatment:       Reason Eval/Treat Not Completed: SLP screened, no needs identified, will sign off   Alona Danford, Katherene Ponto 09/11/2014, 12:32 PM

## 2014-09-12 ENCOUNTER — Inpatient Hospital Stay (HOSPITAL_COMMUNITY): Payer: Medicare Other

## 2014-09-12 ENCOUNTER — Other Ambulatory Visit: Payer: Medicare Other

## 2014-09-12 DIAGNOSIS — D649 Anemia, unspecified: Secondary | ICD-10-CM

## 2014-09-12 DIAGNOSIS — G464 Cerebellar stroke syndrome: Secondary | ICD-10-CM

## 2014-09-12 DIAGNOSIS — I10 Essential (primary) hypertension: Secondary | ICD-10-CM

## 2014-09-12 DIAGNOSIS — G458 Other transient cerebral ischemic attacks and related syndromes: Secondary | ICD-10-CM

## 2014-09-12 DIAGNOSIS — R001 Bradycardia, unspecified: Secondary | ICD-10-CM

## 2014-09-12 DIAGNOSIS — R5381 Other malaise: Secondary | ICD-10-CM

## 2014-09-12 DIAGNOSIS — E785 Hyperlipidemia, unspecified: Secondary | ICD-10-CM

## 2014-09-12 DIAGNOSIS — G459 Transient cerebral ischemic attack, unspecified: Principal | ICD-10-CM

## 2014-09-12 DIAGNOSIS — R29898 Other symptoms and signs involving the musculoskeletal system: Secondary | ICD-10-CM

## 2014-09-12 LAB — CBC
HCT: 27 % — ABNORMAL LOW (ref 36.0–46.0)
Hemoglobin: 9.1 g/dL — ABNORMAL LOW (ref 12.0–15.0)
MCH: 31.2 pg (ref 26.0–34.0)
MCHC: 33.7 g/dL (ref 30.0–36.0)
MCV: 92.5 fL (ref 78.0–100.0)
Platelets: 92 10*3/uL — ABNORMAL LOW (ref 150–400)
RBC: 2.92 MIL/uL — ABNORMAL LOW (ref 3.87–5.11)
RDW: 20.9 % — ABNORMAL HIGH (ref 11.5–15.5)
WBC: 1.9 10*3/uL — ABNORMAL LOW (ref 4.0–10.5)

## 2014-09-12 LAB — CLOSTRIDIUM DIFFICILE BY PCR: Toxigenic C. Difficile by PCR: NEGATIVE

## 2014-09-12 NOTE — Progress Notes (Signed)
Patient ID: [name], female   DOB: [birth year], 81 y.o.   MRN: [id number]    Subjective:  Legs weak no chest pain or dyspnea   Objective:  Filed Vitals:   09/11/14 2344 09/11/14 2345 09/12/14 0505 09/12/14 1015  BP:   127/49   Pulse: 69 60 67   Temp:   98.7 F (37.1 C) 99.4 F (37.4 C)  TempSrc:   Oral Oral  Resp: 23 23 20    Height:      Weight:   58.6 kg (129 lb 3 oz)   SpO2: 100% 100% 98%     Intake/Output from previous day: No intake or output data in the 24 hours ending 09/12/14 1107  Physical Exam: Affect appropriate Frail chronically ill white female  HEENT: normal Neck supple with no adenopathy JVP normal no bruits no thyromegaly Lungs clear with no wheezing and good diaphragmatic motion Heart:  S1/S2 SEM murmur, no rub, gallop or click PMI normal Abdomen: benighn, BS positve, no tenderness, no AAA no bruit.  No HSM or HJR Distal pulses intact with no bruits No edema Neuro non-focal Skin warm and dry No muscular weakness   Lab Results: Basic Metabolic Panel:  Recent Labs  09/09/14 2330 09/09/14 2336 09/10/14 1009 09/11/14 1025  NA 135 141  --  133*  K 3.8 3.9  --  3.3*  CL 104 105  --  104  CO2 23  --   --  24  GLUCOSE 156* 160*  --  102*  BUN 30* 31*  --  23*  CREATININE 1.44* 1.40* 1.22* 1.21*  CALCIUM 9.2  --   --  9.0   Liver Function Tests:  Recent Labs  09/09/14 2330  AST 22  ALT 14  ALKPHOS 58  BILITOT 0.5  PROT 9.1*  ALBUMIN 3.2*   No results for input(s): LIPASE, AMYLASE in the last 72 hours. CBC:  Recent Labs  09/09/14 2330  09/11/14 1025 09/12/14 0315  WBC 6.0  < > 2.8* 1.9*  NEUTROABS 4.5  --   --   --   HGB 7.4*  < > 9.2* 9.1*  HCT 22.4*  < > 27.1* 27.0*  MCV 97.0  < > 92.8 92.5  PLT 111*  < > 89* 92*  < > = values in this interval not displayed. Cardiac Enzymes:  Recent Labs  09/09/14 2330  TROPONINI <0.03     Recent Labs  09/10/14 1009  CHOL 126  HDL 20*  LDLCALC 63  TRIG 213*  CHOLHDL  6.3    Imaging: Mr Brain Wo Contrast  09/10/2014   CLINICAL DATA:  81 year old hypertensive female with sudden onset of left leg weakness. Subsequent encounter.  EXAM: MRI HEAD WITHOUT CONTRAST  MRA HEAD WITHOUT CONTRAST  TECHNIQUE: Multiplanar, multiecho pulse sequences of the brain and surrounding structures were obtained without intravenous contrast. Angiographic images of the head were obtained using MRA technique without contrast.  COMPARISON:  09/10/2014 head CT.  05/24/2014 brain MR.  FINDINGS: MRI HEAD FINDINGS  Exam is motion degraded.  No acute infarct.  No intracranial hemorrhage.  Mild small vessel disease type changes.  Global atrophy without hydrocephalus.  No intracranial mass lesion noted on this unenhanced exam.  Heterogeneous bone marrow unchanged. This may be related to patient's underlying anemia.  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  Mild to slight moderate narrowing distal right vertebral artery.  Moderate narrowing mid basilar artery.  Nonvisualized posterior inferior cerebellar  arteries.  Mild narrowing and irregularity of portions of the posterior cerebral arteries bilaterally.  Moderate narrowing of portions of the cavernous segment and supraclinoid segment of the internal carotid arteries greater on the right. Associated areas of ectasia without saccular aneurysm.  Mild narrowing and irregularity M1 segment left middle cerebral artery. Mild narrowing right middle cerebral artery bifurcation. Moderate narrowing middle cerebral artery branch vessels bilaterally.  Mild narrowing and irregularity A1 segment anterior cerebral artery bilaterally.  IMPRESSION: Exam is motion degraded.  No acute infarct.  Small vessel disease type changes.  Bone marrow changes suggestive of result of underlying anemia.  Prominent intracranial atherosclerotic type changes as detailed above.   Electronically Signed   By: Genia Del M.D.   On: 09/10/2014 15:31   [name]  09/10/2014   CLINICAL DATA:  81 year old hypertensive female with sudden onset of left leg weakness. Subsequent encounter.  EXAM: MRI HEAD WITHOUT CONTRAST  MRA HEAD WITHOUT CONTRAST  TECHNIQUE: Multiplanar, multiecho pulse sequences of the brain and surrounding structures were obtained without intravenous contrast. Angiographic images of the head were obtained using MRA technique without contrast.  COMPARISON:  09/10/2014 head CT.  05/24/2014 brain MR.  FINDINGS: MRI HEAD FINDINGS  Exam is motion degraded.  No acute infarct.  No intracranial hemorrhage.  Mild small vessel disease type changes.  Global atrophy without hydrocephalus.  No intracranial mass lesion noted on this unenhanced exam.  Heterogeneous bone marrow unchanged. This may be related to patient's underlying anemia.  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  Mild to slight moderate narrowing distal right vertebral artery.  Moderate narrowing mid basilar artery.  Nonvisualized posterior inferior cerebellar arteries.  Mild narrowing and irregularity of portions of the posterior cerebral arteries bilaterally.  Moderate narrowing of portions of the cavernous segment and supraclinoid segment of the internal carotid arteries greater on the right. Associated areas of ectasia without saccular aneurysm.  Mild narrowing and irregularity M1 segment left middle cerebral artery. Mild narrowing right middle cerebral artery bifurcation. Moderate narrowing middle cerebral artery branch vessels bilaterally.  Mild narrowing and irregularity A1 segment anterior cerebral artery bilaterally.  IMPRESSION: Exam is motion degraded.  No acute infarct.  Small vessel disease type changes.  Bone marrow changes suggestive of result of underlying anemia.  Prominent intracranial atherosclerotic type changes as detailed above.   Electronically Signed   By: Genia Del M.D.   On: 09/10/2014 15:31    Cardiac Studies:  ECG:  Reviewed 3 ECGs   SB rates 47-53  PR 233 no AV block   Telemetry:   SR rates in 70's  No bradycardia multiple strips mislabeled as afib in sinus with PAC or sinus arrhythmia  Echo:  05/21/14  Study Conclusions  - Left ventricle: Posterior basal hypokinesis The cavity size was normal. Wall thickness was normal. Systolic function was normal. The estimated ejection fraction was in the range of 50% to 55%. Doppler parameters are consistent with both elevated ventricular end-diastolic filling pressure and elevated left atrial filling pressure. - Left atrium: The atrium was mildly dilated. - Atrial septum: No defect or patent foramen ovale was identified. - Pulmonary arteries: PA peak pressure: 37 mm Hg (S).  Medications:   . aspirin  325 mg Oral q morning - 10a  . aspirin  300 mg Rectal Daily  . enoxaparin (LOVENOX) injection  30 mg Subcutaneous Q24H  . ezetimibe  10 mg Oral QHS  . feeding supplement (ENSURE ENLIVE)  237 mL Oral TID BM  . pantoprazole  40 mg Oral BID  . sodium chloride  3 mL Intravenous Q12H       Assessment/Plan:  Bradycardia:  Cannot find strips from ER ? HR in 20s, 30's  Telemetry stable and rhythm not related to her "TIA" symptoms.  No indication for pacer Avoid AV nodal blocking drugs  NO PAF this hospitalization and she is not a candidate for anticoagulation given age, falls and anemia with myeloma And need for chemotherapy  Anemia: presumed from myeloma transfuse  As needed   Myeloma  Chemo next week pending functional status   TIA:  RLE weakness gone  MRI negative neuro seeing doubt this is related to PAF or embolic events with negative MRI  Outpatient f/u Dr Marnee Guarneri [facility] 09/12/2014, 11:07 AM

## 2014-09-12 NOTE — Progress Notes (Signed)
VASCULAR LAB PRELIMINARY  PRELIMINARY  PRELIMINARY  PRELIMINARY  Bilateral lower extremity venous duplex and carotid duplex completed.    Preliminary report:   1.  Venous:  Bilateral:  No evidence of DVT, superficial thrombosis, or Baker's Cyst.   2.  Carotid:  Bilateral:  1-39% ICA stenosis.  Vertebral artery flow is antegrade.      Loral Campi, RVT 09/12/2014, 11:52 AM

## 2014-09-12 NOTE — Care Management Note (Signed)
Case Management Note  Patient Details  Name: IRIE DOWSON MRN: [id number] Date of Birth: 29-Nov-1931  Subjective/Objective: Pt admitted for hypertension and increased weakness.                     Action/Plan: PT recommendations for home [facility], however pt deconditioned and will not have intermittent supervision. Plan will be for SNF once stable. CM did speak with Physical Therapist Jinny Blossom to see if another therapist could speak with pt. No further needs from CM. CSW to assist with disposition needs.    Expected Discharge Date:                  Expected Discharge Plan:  Skilled Nursing Facility  In-House Referral:  Clinical Social Work  Discharge planning Services  CM Consult  Post Acute Care Choice:    Choice offered to:     DME Arranged:    DME Agency:     HH Arranged:    [place] Agency:     Status of Service:  Completed, signed off  Medicare Important Message Given:    Date Medicare IM Given:    Medicare IM give by:    Date Additional Medicare IM Given:    Additional Medicare Important Message give by:     If discussed at [place] of Stay Meetings, dates discussed:    Additional Comments:  Bethena Roys, RN 09/12/2014, 1:26 PM

## 2014-09-12 NOTE — Progress Notes (Signed)
STROKE TEAM PROGRESS NOTE   HISTORY [name] is a 81 y.o. female who presents with sudden onset leg weakness this evening. She states that she was walking normally and then tried to lift her leg to get into bed and noticed that she was unable to get her leg up into bed. After this when she tried to walk around she noticed that she was unable to walk normally. She did feel somewhat lightheaded at the time, but it still is clearly just her left leg and not both legs that was feeling weak. She denies back pain, leg pain other than the pain that is been present since her bone marrow biopsies.    SUBJECTIVE (INTERVAL HISTORY) PA from IM called - concerned as this am pt very lethargic, generalized weakness when trying to get OOB. Concerned she is worse. No focal neuro symptoms. On rounds, Dr. Erlinda Hong discussed with patient. She reported she was very tired and sleepy, that she just didn't want to get up. Dr. Erlinda Hong found her alert and brisk in reaction. Mentally clear.  OBJECTIVE Temp:  [98.7 F (37.1 C)-100.3 F (37.9 C)] 98.7 F (37.1 C) (06/07 0505) Pulse Rate:  [32-81] 67 (06/07 0505) Cardiac Rhythm:  [-] Heart block (06/07 0323) Resp:  [15-28] 20 (06/07 0505) BP: (88-127)/(42-69) 127/49 mmHg (06/07 0505) SpO2:  [94 %-100 %] 98 % (06/07 0505) Weight:  [58.6 kg (129 lb 3 oz)] 58.6 kg (129 lb 3 oz) (06/07 0505)  No results for input(s): GLUCAP in the last 168 hours.  Recent Labs Lab 09/09/14 2330 09/09/14 2336 09/10/14 1009 09/11/14 1025  NA 135 141  --  133*  K 3.8 3.9  --  3.3*  CL 104 105  --  104  CO2 23  --   --  24  GLUCOSE 156* 160*  --  102*  BUN 30* 31*  --  23*  CREATININE 1.44* 1.40* 1.22* 1.21*  CALCIUM 9.2  --   --  9.0    Recent Labs Lab 09/09/14 2330  AST 22  ALT 14  ALKPHOS 58  BILITOT 0.5  PROT 9.1*  ALBUMIN 3.2*    Recent Labs Lab 09/09/14 2330 09/09/14 2336 09/10/14 1009 09/11/14 1025 09/12/14 0315  WBC 6.0  --  6.8 2.8* 1.9*  NEUTROABS 4.5  --    --   --   --   HGB 7.4* 8.2* 9.3* 9.2* 9.1*  HCT 22.4* 24.0* 27.5* 27.1* 27.0*  MCV 97.0  --  92.3 92.8 92.5  PLT 111*  --  97* 89* 92*    Recent Labs Lab 09/09/14 2330  TROPONINI <0.03   No results for input(s): LABPROT, INR in the last 72 hours. No results for input(s): COLORURINE, LABSPEC, Edgar, GLUCOSEU, HGBUR, BILIRUBINUR, KETONESUR, PROTEINUR, UROBILINOGEN, NITRITE, LEUKOCYTESUR in the last 72 hours.  Invalid input(s): APPERANCEUR     Component Value Date/Time   CHOL 126 09/10/2014 1009   TRIG 213* 09/10/2014 1009   HDL 20* 09/10/2014 1009   CHOLHDL 6.3 09/10/2014 1009   VLDL 43* 09/10/2014 1009   LDLCALC 63 09/10/2014 1009   Lab Results  Component Value Date   HGBA1C 5.6 09/10/2014   No results found for: LABOPIA, COCAINSCRNUR, LABBENZ, AMPHETMU, THCU, LABBARB  No results for input(s): ETH in the last 168 hours.  Ct Head Wo Contrast 09/10/2014    1. No acute intracranial process.  2. Generalized cerebral atrophy with mild chronic microvascular ischemic disease, similar to previous.     Mri &  Mra Head 09/10/2014   Exam is motion degraded.  No acute infarct.  Small vessel disease type changes.  Bone marrow changes suggestive of result of underlying anemia.  Prominent intracranial atherosclerotic type changes.  2D echo  - Left ventricle: Posterior basal hypokinesis The cavity size wasnormal. Wall thickness was normal. Systolic function was normal.The estimated ejection fraction was in the range of 50% to 55%.Doppler parameters are consistent with both elevated ventricularend-diastolic filling pressure and elevated left atrial fillingpressure. - Left atrium: The atrium was mildly dilated. - Atrial septum: No defect or patent foramen ovale was identified. - Pulmonary arteries: PA peak pressure: 37 mm Hg (S).  Carotid Ultrasound Bilateral: 1-39% ICA stenosis. Vertebral artery flow is antegrade.   LE venous doppler  No evidence of DVT, superficial thrombosis,  or Baker's Cyst.   PHYSICAL EXAM Temp:  [98.7 F (37.1 C)-100.3 F (37.9 C)] 98.7 F (37.1 C) (06/07 0505) Pulse Rate:  [32-81] 67 (06/07 0505) Resp:  [15-28] 20 (06/07 0505) BP: (88-127)/(42-69) 127/49 mmHg (06/07 0505) SpO2:  [94 %-100 %] 98 % (06/07 0505) Weight:  [58.6 kg (129 lb 3 oz)] 58.6 kg (129 lb 3 oz) (06/07 0505)  General - Well nourished, well developed, in no apparent distress.  Ophthalmologic - Fundi not visualized due to eye movement.  Cardiovascular - irregular heart rhythm seems with premature beats.  Mental Status -  Level of arousal and orientation to time, place, and person were intact. Language including expression, naming, repetition, comprehension was assessed and found intact.  Cranial Nerves II - XII - II - Visual field intact OU. III, IV, VI - Extraocular movements intact V - Facial sensation intact bilaterally. VII - Facial movement intact bilaterally. VIII - Hearing & vestibular intact bilaterally. X - Palate elevates symmetrically. XI - Chin turning & shoulder shrug intact bilaterally. XII - Tongue protrusion intact.  Motor Strength - The patient's strength was normal in all extremities and pronator drift was absent.  Bulk was normal and fasciculations were absent.   Motor Tone - Muscle tone was assessed at the neck and appendages and was normal.  Reflexes - The patient's reflexes were 1+ in all extremities and she had no pathological reflexes.  Sensory - Light touch, temperature/pinprick were assessed and were symmetrical.    Coordination - The patient had normal movements in the hands with no ataxia or dysmetria.  Tremor was absent.  Gait and Station - not tested due to safety concerns.   ASSESSMENT/PLAN [name] is a 81 y.o. female with history of COPD on home O2, CAD and previous MI s/p CABG in 2005, HTN, HLD, multiple myeloma pending chemotherapy next week, anemia requiring blood transfusion, and a questionable history of  atrial fibrillation presenting with left lower extremity weakness. She did not receive IV t-PA due to mild deficits.   R hemisphere TIA   Resultant  left lower extremity weakness resolved  MRI  No acute stroke  MRA  No large vessel stenosis. small vessel disease   Carotid Doppler No significant stenosis   2D Echo  EF 50-55%  LE venous Doppler no DVT  LDL 63  HgbA1c 5.6, WNL  Lovenox for VTE prophylaxis Diet Heart Room service appropriate?: Yes; Fluid consistency:: Thin  aspirin 325 mg orally every day prior to admission, now on aspirin 325 mg orally every day. Continue ASA at discharge if not contraindicated.  Ongoing aggressive stroke risk factor management  Therapy recommendations: no OT, HH PT  Disposition: home with [facility]  PT  Bradycardia   Cardiology on board  No indication for pacemaker from their standpoint   Close monitoring  Questionable paroxysmal A. Fib  Patient stated that she had paroxysmal A. Fib  On tele 02/2014, she seems to have afib with RVR - need cardiology confirmation  However, this was not documented in cardiology note  Pt at the time being is not a candidate for anticoagulation due to anemia needs blood transfusion. Once pt became candidate again for anticoagulation, 30 day cardiac event monitoring is recommended. If pacer placed at that time, pacer can be interrogated to rule in or rule out afib.  Hypertension  Home meds: Hydrochlorothiazide  Hypotension yesterday 88/46, improved today  HCTZ discontinued  Stable  Continue monitoring  Hyperlipidemia  Home meds:  Zetia 10 mg daily resumed in hospital  LDL 63, goal < 70  Continue Zetia at discharge  Other Stroke Risk Factors  Advanced age  Cigarette smoker, quit smoking in 2012  ETOH use - occasional glass of wine  Coronary artery disease s/p CABG  Other Active Problems  Multiple myeloma pending chemotherapy, originally planned for this week  Anemia due to MM  requiring transfusion  Mild Thrombocytopenia  Renal insufficiency, Cr 1.21  Leukocytosis 6.8>1.9   NO FURTHER STROKE WORKUP INDICATED Patient has a 10-15% risk of having another stroke over the next year, the highest risk is within 2 weeks of the most recent stroke/TIA (risk of having a stroke following a stroke or TIA is the same). Ongoing risk factor control by Primary Care Physician Stroke Service will sign off. Please call should any needs arise. Follow-up Stroke Clinic at [facility] with Dr. Rosalin Hawking in 2 months, order placed.  Neurology will sign off. Please call with questions. Pt will follow up with Dr. Erlinda Hong at [facility] in about 2 months. Thanks for the consult.  Rosalin Hawking, MD PhD Stroke Neurology 09/12/2014 4:41 PM   To contact Stroke Continuity provider, please refer to [email or web address] After hours, contact General Neurology

## 2014-09-12 NOTE — Clinical Social Work Placement (Signed)
   CLINICAL SOCIAL WORK PLACEMENT  NOTE  Date:  09/12/2014  Patient Details  Name: [name] MRN: [id number] Date of Birth: January 09, 1932  Clinical Social Work is seeking post-discharge placement for this patient at the [place] level of care (*CSW will initial, date and re-position this form in  chart as items are completed):  Yes   Patient/family provided with [facility] Work Department's list of facilities offering this level of care within the geographic area requested by the patient (or if unable, by the patient's family).  Yes   Patient/family informed of their freedom to choose among providers that offer the needed level of care, that participate in Medicare, Medicaid or managed care program needed by the patient, have an available bed and are willing to accept the patient.  Yes   Patient/family informed of [facility]'s ownership interest in [facility] and [facility], as well as of the fact that they are under no obligation to receive care at these facilities.  PASRR submitted to EDS on  (n/a)     PASRR number received on  (n/a)     Existing PASRR number confirmed on 09/12/14     FL2 transmitted to all facilities in geographic area requested by pt/family on 09/12/14     FL2 transmitted to all facilities within larger geographic area on  (n/a)     Patient informed that his/her managed care company has contracts with or will negotiate with certain facilities, including the following:   (yes)         Patient/family informed of bed offers received.  Patient chooses bed at       Physician recommends and patient chooses bed at      Patient to be transferred to   on  .  Patient to be transferred to facility by       Patient family notified on   of transfer.  Name of family member notified:        PHYSICIAN Please sign FL2     Additional Comment:    _______________________________________________ Caroline Sauger,  LCSW 09/12/2014, 11:35 AM [phone]

## 2014-09-12 NOTE — Progress Notes (Signed)
Physical Therapy Treatment Patient Details Name: [name] MRN: [id number] DOB: 07/07/1931 Today's Date: 09/12/2014    History of Present Illness pt presents with L LE weakness and HTN.  pt with hx of Bradycardia, multiple Myeloma, COPD.  MRI negative     PT Comments    Pt pleasant but appears very tired and only able to tolerate limited mobility today compared to previous day. Pt reports she started feeling worse and weaker throughout the day yesterday and currently has no energy. Pt willing to move but very slow with transitions, reports lightheadedness but sats remained 94-97% on RA and BP 115/53 (68) EOB and 129/46 (68) in standing with HR 84. Pt able to initiate HEP but unable to complete more than one exercise. Pt reports dgtr works and is not available all the time and that she cannot take care of herself which in current state I concur. Recommend St-SNF to increase strength and function prior to return home.   Follow Up Recommendations  SNF     Equipment Recommendations  None recommended by PT    Recommendations for Other Services       Precautions / Restrictions Precautions Precautions: Fall Restrictions Weight Bearing Restrictions: No    Mobility  Bed Mobility Overal bed mobility: Needs Assistance Bed Mobility: Supine to Sit     Supine to sit: Min assist     General bed mobility comments: increased time with cues and assist to elevate trunk from surface  Transfers Overall transfer level: Needs assistance   Transfers: Sit to/from Stand Sit to Stand: Min assist         General transfer comment: cues for sequence and hand placement. Pt reported feeling lightheaded with standing for BP (129/46) fatigued and had to sit. 2nd standing trial able to ambulate  Ambulation/Gait Ambulation/Gait assistance: Min assist Ambulation Distance (Feet): 15 Feet Assistive device: Rolling walker (2 wheeled) Gait Pattern/deviations: Step-through pattern;Trunk  flexed;Decreased stride length   Gait velocity interpretation: Below normal speed for age/gender General Gait Details: cues for posture with assist to direct RW and distance limited by fatigue only able to complete 15' today   Stairs            Wheelchair Mobility    Modified Rankin (Stroke Patients Only)       Balance Overall balance assessment: Needs assistance   Sitting balance-Leahy Scale: Fair       Standing balance-Leahy Scale: Poor                      Cognition Arousal/Alertness: Awake/alert Behavior During Therapy: Flat affect Overall Cognitive Status: Within Functional Limits for tasks assessed                      Exercises General Exercises - Lower Extremity Long Arc Quad: AROM;Seated;Both;10 reps    General Comments        Pertinent Vitals/Pain Pain Assessment: No/denies pain    Home Living                      Prior Function            PT Goals (current goals can now be found in the care plan section) Progress towards PT goals: Not progressing toward goals - comment (limited by fatigue and weakness today)    Frequency  Min 3X/week    PT Plan Discharge plan needs to be updated    Co-evaluation               End of Session Equipment Utilized During Treatment: Gait belt Activity Tolerance: Patient limited by fatigue Patient left: in chair;with call bell/phone within reach;with chair alarm set     Time: 1234-1257 PT Time Calculation (min) (ACUTE ONLY): 23 min  Charges:  $Therapeutic Activity: 23-37 mins                    G Codes:      Tabor, Maija Beth 09/12/2014, 1:57 PM Maija Tabor Prater, PT [phone]   

## 2014-09-12 NOTE — Consult Note (Signed)
[facility] History and Physical  [name] HKF:[id number] DOB: Apr 07, 1932 DOA: 09/09/2014  Referring physician: Dr. Johnsie Cancel, Cardiology PCP: [name], MD   Chief Complaint: Lower extremity weakness  HPI: [name] is a 81 y.o. female, with a past medical history of COPD, coronary artery disease status post CABG, and multiple myeloma.  She presented to the emergency department on the evening of 6/5 with left lower extremity weakness and a right-sided headache. In the ER she was noted to have bradycardia, frequently dropping into the 20s and 30s. She reported palpitations over the preceding 6 months that lasted 30+ minutes once every 2-3 weeks.  These were associated with lightheadedness.  As noted in the H&P the patient had multiple previous admissions in the last 6-7 months.  She was admitted by cardiology for bradycardia. Pacemaker was considered. Fortunately the patient was monitored on telemetry and improved. With a pulse rate consistently in the 60s and 70s. Dr. Johnsie Cancel saw patient on 6/6 and felt that she was stable for outpatient follow-up from a cardiac standpoint. At that time he called [facility] to assume primary care of the patient on 6/7.  The patient was also evaluated by neurology for TIA. She is in the process of undergoing her TIA workup. This morning on my exam the patient is lethargic and unable to stand without a significant amount of assistance. I have talked with her daughter Butch Penny who is a full-time Administrator and unable to care for her mother at home.  Review of Systems:  Constitutional: No weight loss, night sweats, Fevers, chills, fatigue.  HEENT: No headaches, Difficulty swallowing,Tooth/dental problems,Sore throat, No sneezing, itching, ear ache, nasal congestion, post nasal drip,  Cardio-vascular: No chest pain, Orthopnea, PND, swelling in lower extremities, anasarca, GI: No heartburn, indigestion, abdominal pain, nausea, vomiting,  diarrhea, change in bowel habits, loss of appetite  Resp: No shortness of breath with exertion or at rest. No excess mucus, no productive cough, No non-productive cough, No coughing up of blood.No change in color of mucus.No wheezing.No chest wall deformity  Skin: no rash or lesions.  GU: no dysuria, change in color of urine, no urgency or frequency. No flank pain.  Musculoskeletal: ++Left hip pain. Psych: No change in mood or affect. No depression or anxiety. No memory loss.   Past Medical History  Diagnosis Date  . COPD (chronic obstructive pulmonary disease)   . Gout   . Coronary atherosclerosis of native coronary artery 02/05/2012  . Hypertension   . High blood cholesterol   . Heart murmur     "all my life" (02/05/2012)  . Anginal pain   . Myocardial infarction 1990's; 2000's    "2 total, I think" (02/05/2012)  . Pneumonia     "several times" (02/05/2012)  . Chronic bronchitis   . Shortness of breath     "sometimes when I'm laying down; always when I do too much" (02/05/2012)  . History of blood transfusion   . External bleeding hemorrhoids     "act up at times" (02/05/2012)  . Chronic back pain     "all over" (02/05/2012)  . Depression   . CAD (coronary artery disease) of artery bypass graft 02/09/14    atretic LIMA  . Multiple myeloma 09/07/2014   Past Surgical History  Procedure Laterality Date  . Appendectomy  ? 1970's  . Ectopic pregnancy surgery  1970's  . Abdominal hysterectomy  ? 1970's    "partial 1st time; complete 2nd" (02/05/2012)  . Cholecystectomy  ~  2010  . Cataract extraction w/ intraocular lens  implant, bilateral  1980's-1990's  . Coronary angioplasty with stent placement  2005  . Coronary artery bypass graft  2005    CABG X2  . Cardiac catheterization  02/09/14    atretic LIMA,  patent VG-dRCA and Total occlusion of the native RCA proximally prior to remotely placement RCA stent, with mild proximal 30% LAD stenosis and 30% distal circumflex stenoses.   EF 55%.  . Left heart catheterization with coronary/graft angiogram N/A 02/09/2014    Procedure: LEFT HEART CATHETERIZATION WITH Beatrix Fetters;  Surgeon: Troy Sine, MD;  Location[facility] CATH LAB;  Service: Cardiovascular;  Laterality: N/A;  . Esophagogastroduodenoscopy (egd) with propofol Left 05/22/2014    Procedure: ESOPHAGOGASTRODUODENOSCOPY (EGD) WITH PROPOFOL;  Surgeon: Arta Silence, MD;  Location: [facility] ENDOSCOPY;  Service: Endoscopy;  Laterality: Left;   Social History:  reports that she quit smoking about 3 years ago. Her smoking use included Cigarettes. She has a 50 pack-year smoking history. She has never used smokeless tobacco. She reports that she drinks alcohol. She reports that she does not use illicit drugs.  Allergies  Allergen Reactions  . Lexapro [Escitalopram Oxalate] Other (See Comments)    Fatigue   . Soma [Carisoprodol] Other (See Comments)    Fatigue   . Wellbutrin [Bupropion] Other (See Comments)    "couldn't function and take it"  . Albuterol Sulfate Other (See Comments)    Albuterol HFA inhaler caused nervousness   . Codeine Hives  . Levaquin [Levofloxacin In D5w] Other (See Comments)    Unknown allergic reaction  . Plavix [Clopidogrel Bisulfate] Other (See Comments)    Unknown reaction  . Statins     Family History  Problem Relation Age of Onset  . Anemia Neg Hx   . Arrhythmia Neg Hx   . Asthma Neg Hx   . Clotting disorder Neg Hx   . Fainting Neg Hx   . Heart attack Neg Hx   . Heart disease Neg Hx   . Heart failure Neg Hx   . Hyperlipidemia Neg Hx   . Hypertension Neg Hx   . Aneurysm    . CVA    . CVA Mother   . Aneurysm Father     Prior to Admission medications   Medication Sig Start Date End Date Taking? Authorizing Provider  albuterol-ipratropium (COMBIVENT) 18-103 MCG/ACT inhaler Inhale 1-2 puffs into the lungs every 4 (four) hours as needed for wheezing or shortness of breath.   Yes Historical Provider, MD  aspirin 325 MG EC  tablet Take 325 mg by mouth every morning.    Yes Historical Provider, MD  cholecalciferol (VITAMIN D) 1000 UNITS tablet Take 1,000 Units by mouth every morning.    Yes Historical Provider, MD  dexamethasone (DECADRON) 4 MG tablet Take 5 tablets every week before chemotherapy injection. 09/07/14  Yes Wyatt Portela, MD  ezetimibe (ZETIA) 10 MG tablet Take 1 tablet (10 mg total) by mouth at bedtime. 05/25/14  Yes Hosie Poisson, MD  hydrochlorothiazide (HYDRODIURIL) 25 MG tablet Take 25 mg by mouth every morning.  05/16/14  Yes Historical Provider, MD  megestrol (MEGACE) 400 MG/10ML suspension Take 10 mLs (400 mg total) by mouth 2 (two) times daily. 09/07/14  Yes Wyatt Portela, MD  nitroGLYCERIN (NITROSTAT) 0.4 MG SL tablet Place 0.4 mg under the tongue every 5 (five) minutes as needed for chest pain.   Yes Historical Provider, MD  Omega-3 Fatty Acids (FISH OIL) 1200 MG CAPS  Take 1,200-2,400 mg by mouth 2 (two) times daily. Take 2 capsules (2400 mg) every morning and 1 capsule (1200 mg) every night   Yes Historical Provider, MD  pantoprazole (PROTONIX) 40 MG tablet Take 1 tablet (40 mg total) by mouth 2 (two) times daily. 05/25/14  Yes Hosie Poisson, MD  polyethylene glycol powder (GLYCOLAX/MIRALAX) powder Take 17 g by mouth every morning. Mix with 8 oz liquid and drink 05/16/14  Yes Historical Provider, MD  prochlorperazine (COMPAZINE) 10 MG tablet Take 10 mg by mouth 3 (three) times daily. As needed for nausea/vomiting 06/29/14  Yes Historical Provider, MD  RA SENNA 8.6 MG tablet Take 2 tablets by mouth at bedtime.  02/07/14  Yes Historical Provider, MD  tiotropium (SPIRIVA) 18 MCG inhalation capsule Place 18 mcg into inhaler and inhale daily as needed (shortness of breath).    Yes Historical Provider, MD  zolpidem (AMBIEN) 5 MG tablet Take 5 mg by mouth at bedtime as needed for sleep.   Yes Historical Provider, MD   Physical Exam: Filed Vitals:   09/11/14 2343 09/11/14 2344 09/11/14 2345 09/12/14 0505  BP:     127/49  Pulse: 61 69 60 67  Temp:    98.7 F (37.1 C)  TempSrc:    Oral  Resp: $Remo'15 23 23 20  'jiPvE$ Height:      Weight:    58.6 kg (129 lb 3 oz)  SpO2: 98% 100% 100% 98%    Wt Readings from Last 3 Encounters:  09/12/14 58.6 kg (129 lb 3 oz)  09/07/14 56.745 kg (125 lb 1.6 oz)  08/24/14 58.378 kg (128 lb 11.2 oz)    General:  Elderly frail female. Disoriented. Wet with urine. Attempting to stand to use the side commode but needs full assistance to do so Eyes: PER, normal lids, irises & conjunctiva ENT: grossly normal hearing, lips & tongue Neck: no LAD, masses or thyromegaly Cardiovascular: RRR, no m/r/g. No LE edema. Respiratory: CTA bilaterally, no w/r/r. Normal respiratory effort. Abdomen: soft, nt, nd,no masses, positive bowel sounds. Skin: no rash or induration seen on limited exam Musculoskeletal: no swelling noted. Able to move all 4 extremities. Patient has significant generalized weakness Psychiatric: grossly normal mood and affect, speech fluent and appropriate Neurologic: grossly non-focal.          Labs on Admission:  Basic Metabolic Panel:  Recent Labs Lab 09/09/14 2330 09/09/14 2336 09/10/14 1009 09/11/14 1025  NA 135 141  --  133*  K 3.8 3.9  --  3.3*  CL 104 105  --  104  CO2 23  --   --  24  GLUCOSE 156* 160*  --  102*  BUN 30* 31*  --  23*  CREATININE 1.44* 1.40* 1.22* 1.21*  CALCIUM 9.2  --   --  9.0   Liver Function Tests:  Recent Labs Lab 09/09/14 2330  AST 22  ALT 14  ALKPHOS 58  BILITOT 0.5  PROT 9.1*  ALBUMIN 3.2*   CBC:  Recent Labs Lab 09/09/14 2330 09/09/14 2336 09/10/14 1009 09/11/14 1025 09/12/14 0315  WBC 6.0  --  6.8 2.8* 1.9*  NEUTROABS 4.5  --   --   --   --   HGB 7.4* 8.2* 9.3* 9.2* 9.1*  HCT 22.4* 24.0* 27.5* 27.1* 27.0*  MCV 97.0  --  92.3 92.8 92.5  PLT 111*  --  97* 89* 92*   Cardiac Enzymes:  Recent Labs Lab 09/09/14 2330  TROPONINI <0.03    BNP (  last 3 results)  Recent Labs  05/20/14 1709  05/23/14 1445 08/13/14 1216  BNP 144.9* 537.1* 150.0*    ProBNP (last 3 results)  Recent Labs  02/08/14 1727  PROBNP 121.3    Radiological Exams on Admission: Mr Brain Wo Contrast  09/10/2014   CLINICAL DATA:  81 year old hypertensive female with sudden onset of left leg weakness. Subsequent encounter.  EXAM: MRI HEAD WITHOUT CONTRAST  MRA HEAD WITHOUT CONTRAST  TECHNIQUE: Multiplanar, multiecho pulse sequences of the brain and surrounding structures were obtained without intravenous contrast. Angiographic images of the head were obtained using MRA technique without contrast.  COMPARISON:  09/10/2014 head CT.  05/24/2014 brain MR.  FINDINGS: MRI HEAD FINDINGS  Exam is motion degraded.  No acute infarct.  No intracranial hemorrhage.  Mild small vessel disease type changes.  Global atrophy without hydrocephalus.  No intracranial mass lesion noted on this unenhanced exam.  Heterogeneous bone marrow unchanged. This may be related to patient's underlying anemia.  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  Mild to slight moderate narrowing distal right vertebral artery.  Moderate narrowing mid basilar artery.  Nonvisualized posterior inferior cerebellar arteries.  Mild narrowing and irregularity of portions of the posterior cerebral arteries bilaterally.  Moderate narrowing of portions of the cavernous segment and supraclinoid segment of the internal carotid arteries greater on the right. Associated areas of ectasia without saccular aneurysm.  Mild narrowing and irregularity M1 segment left middle cerebral artery. Mild narrowing right middle cerebral artery bifurcation. Moderate narrowing middle cerebral artery branch vessels bilaterally.  Mild narrowing and irregularity A1 segment anterior cerebral artery bilaterally.  IMPRESSION: Exam is motion degraded.  No acute infarct.  Small vessel disease type changes.  Bone marrow changes suggestive of result of underlying anemia.   Prominent intracranial atherosclerotic type changes as detailed above.   Electronically Signed   By: Genia Del M.D.   On: 09/10/2014 15:31   [name]  09/10/2014   CLINICAL DATA:  81 year old hypertensive female with sudden onset of left leg weakness. Subsequent encounter.  EXAM: MRI HEAD WITHOUT CONTRAST  MRA HEAD WITHOUT CONTRAST  TECHNIQUE: Multiplanar, multiecho pulse sequences of the brain and surrounding structures were obtained without intravenous contrast. Angiographic images of the head were obtained using MRA technique without contrast.  COMPARISON:  09/10/2014 head CT.  05/24/2014 brain MR.  FINDINGS: MRI HEAD FINDINGS  Exam is motion degraded.  No acute infarct.  No intracranial hemorrhage.  Mild small vessel disease type changes.  Global atrophy without hydrocephalus.  No intracranial mass lesion noted on this unenhanced exam.  Heterogeneous bone marrow unchanged. This may be related to patient's underlying anemia.  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  Mild to slight moderate narrowing distal right vertebral artery.  Moderate narrowing mid basilar artery.  Nonvisualized posterior inferior cerebellar arteries.  Mild narrowing and irregularity of portions of the posterior cerebral arteries bilaterally.  Moderate narrowing of portions of the cavernous segment and supraclinoid segment of the internal carotid arteries greater on the right. Associated areas of ectasia without saccular aneurysm.  Mild narrowing and irregularity M1 segment left middle cerebral artery. Mild narrowing right middle cerebral artery bifurcation. Moderate narrowing middle cerebral artery branch vessels bilaterally.  Mild narrowing and irregularity A1 segment anterior cerebral artery bilaterally.  IMPRESSION: Exam is motion degraded.  No acute infarct.  Small vessel disease type changes.  Bone marrow changes suggestive of result of underlying anemia.  Prominent intracranial  atherosclerotic type changes as detailed above.   Electronically Signed   By: Genia Del M.D.   On: 09/10/2014 15:31    EKG: multiple EKGs performed. 6/4 EKG shows sinus bradycardia at 47 bpm., 6/5 EKG shows sinus rhythm at 53 bpm with a prolonged QT.  Assessment/Plan Principal Problem:   TIA (transient ischemic attack) Active Problems:   Dyslipidemia   Hypertension   CAD (coronary artery disease) of artery bypass graft, atretic LIMA   Normocytic anemia   Transient left leg weakness   Acute-on-chronic kidney injury   Bradycardia   Leg weakness   SSS (sick sinus syndrome)   Hemispheric carotid artery syndrome   TIA Seen by neurology and the stroke team Carotid dopplers and lower extremity dopplers are still pending. (ordered by Neuro) Echo completed yesterday - I called the vascular lab:  Results show LVEF of 60 - 65% with a calcified annulus of the mitral valve. Study not technically sufficient to evaluate for diastolic dysfunction.  Wall motion was normal. 325 mg daily aspirin recommended by stroke team for prevention. Acute symptoms appear to have resolved. Patient too weak to stand without a significant amount of assistance today.  Will ask PT to re-evaluate. I called her daughter Butch Penny - who before I said a word, broke into tears and told me she had to work full time, her brother was in a nursing home & that she could not care for her mother.   Discussed with Stroke team Ivin Booty) who will round on the patient to determine if she is significantly different from 6/6.  Bradycardia Reviewed telemetry with Cardiology PA, Almyra Deforest this am.  For the past 48 hours the patient has remained steadily in the 86s.  Cardiology signed off on 6/6 and recommended outpatient follow up.  Will attempt to schedule this with Dr. Tamala Julian ASAP.  HTN Patient was doing well on HCTZ but dropped into the 93O systolic multiple times yesterday 6/6. HCTZ discontinued.  Would likely just completely  discontinue HCTZ in this frail elderly lady prone to dehydration, who has CKD.  BP currently normal 127/49.  CAD Continue Aspirin.  No complaints of CP.  Multiple Myeloma pending chemotherapy next week Anemia due to MM received 1 unit PRBC in the ER.  Hgb is stable. Mild Thrombocytopenia  CKD Creatinine at baseline.  D/C HCTZ.  Consultants:  Cardiology signed off.  Neurology/Stroke Team.  Code Status: Full DVT Prophylaxis: Lovenox Family Communication: Talked with DTR Butch Penny. Disposition Plan: Patient appears to need 24/7 care that can not be provided by her family.  I have asked PT to re-evaluate.  Patient will be ready for D/C when she has a safe disposition and Carotid dopplers and Lower extremity dopplers have been completed.  Time spent: 45 min. Karen Kitchens [facility] Pager [phone]

## 2014-09-12 NOTE — Clinical Social Work Note (Signed)
Clinical Social Work Assessment  Patient Details  Name: [name] MRN: [id number] Date of Birth: [birth year]  Date of referral:  09/12/14               Reason for consult:  Facility Placement, Discharge Planning                Permission sought to share information with:  Facility Sport and exercise psychologist, Family Supports Permission granted to share information::  Yes, Verbal Permission Granted  Name::     Mauricio Po  Agency::  [facility]  Relationship::  Daughter  Contact Information:  [phone]  Housing/Transportation Living arrangements for the past 2 months:  [place] of Information:  Adult Children Patient Interpreter Needed:  None Criminal Activity/Legal Involvement Pertinent to Current Situation/Hospitalization:  No - Comment as needed Significant Relationships:  Adult Children Lives with:  Self Do you feel safe going back to the place where you live?  No (High fall risk.) Need for family participation in patient care:  Yes (Comment) (Patient's daughter active in patient's care.)  Care giving concerns:  Patient's daughter expressed concern regarding patient returning home alone. Per patient's daughter, patient is home alone during the day due to daughters work schedule. Patient's daughter informed CSW patient has a history of (two) falls at home.   Social Worker assessment / plan:  RNCM informed CSW PT to now recommend SNF placement for patient as patient has deconditioned. CSW spoke with patient's daughter regarding change in disposition. Per patient's daughter, patient's family aware of change in disposition and agreeable to SNF placement. Patient's daughter expressed preference for [facility]. CSW to continue to follow and assist with discharge planning needs.  Employment status:  Retired Nurse, adult PT Recommendations:  Home with [place] / Referral to community resources:   [place]  Patient/Family's Response to care:  Patient's daughter understanding and agreeable to CSW plan of care.  Patient/Family's Understanding of and Emotional Response to Diagnosis, Current Treatment, and Prognosis:  Patient's daughter understanding and agreeable to CSW plan of care.  Emotional Assessment Appearance:  Other (Comment Required (Patient currently confused, CSW spoke with patient's daughter.) Attitude/Demeanor/Rapport:  Other (Patient currently confused, CSW spoke with patient's daughter.) Affect (typically observed):  Other (Patient currently confused, CSW spoke with patient's daughter.) Orientation:  Oriented to Self, Oriented to Place, Oriented to  Time, Oriented to Situation Alcohol / Substance use:  Not Applicable Psych involvement (Current and /or in the community):  No (Comment) (Not appropriate on this admission.)  Discharge Needs  Concerns to be addressed:  No discharge needs identified Readmission within the last 30 days:  No Current discharge risk:  None Barriers to Discharge:  No Barriers Identified   Caroline Sauger, LCSW 09/12/2014, 11:30 AM [phone]

## 2014-09-13 ENCOUNTER — Other Ambulatory Visit: Payer: Self-pay

## 2014-09-13 DIAGNOSIS — N179 Acute kidney failure, unspecified: Secondary | ICD-10-CM

## 2014-09-13 DIAGNOSIS — D630 Anemia in neoplastic disease: Secondary | ICD-10-CM

## 2014-09-13 DIAGNOSIS — N189 Chronic kidney disease, unspecified: Secondary | ICD-10-CM

## 2014-09-13 MED ORDER — ACETAMINOPHEN 325 MG PO TABS: 650.0000 mg | ORAL_TABLET | Freq: Four times a day (QID) | ORAL | Status: AC | PRN

## 2014-09-13 MED ORDER — ENSURE ENLIVE PO LIQD: 237.0000 mL | Freq: Three times a day (TID) | ORAL | Status: AC

## 2014-09-13 NOTE — Discharge Planning (Signed)
Patient to be discharged to [facility]. Patient's daughter, Mauricio Po, updated regarding discharge.  Facility: [facility] RN report number: [phone] Transportation: EMS Corey Harold) scheduled for 2:30pm per facility request  Sarahmarie Leavey, [place] [phone]) and Surgical [phone])

## 2014-09-13 NOTE — Discharge Summary (Addendum)
Physician Discharge Summary  [name] MRN: [id number] DOB/AGE: [birth year] 81 y.o.  PCP: [name], MD   Admit date: 09/09/2014 Discharge date: 09/13/2014  Discharge Diagnoses:   Principal Problem:   TIA (transient ischemic attack) Active Problems:   Dyslipidemia   Hypertension   CAD (coronary artery disease) of artery bypass graft, atretic LIMA   Normocytic anemia   Transient left leg weakness   Acute-on-chronic kidney injury   Bradycardia   Leg weakness   SSS (sick sinus syndrome)   Hemispheric carotid artery syndrome  Follow up recommendations  pcp in 3-5 days  Oncology as scheduled  follow up with Dr. Erlinda Hong at [facility] in about 2 months    Medication List    STOP taking these medications        hydrochlorothiazide 25 MG tablet  Commonly known as:  HYDRODIURIL      TAKE these medications        acetaminophen 325 MG tablet  Commonly known as:  TYLENOL  Take 2 tablets (650 mg total) by mouth every 6 (six) hours as needed for mild pain (or Fever >/= 101).     albuterol-ipratropium 18-103 MCG/ACT inhaler  Commonly known as:  COMBIVENT  Inhale 1-2 puffs into the lungs every 4 (four) hours as needed for wheezing or shortness of breath.     aspirin 325 MG EC tablet  Take 325 mg by mouth every morning.     cholecalciferol 1000 UNITS tablet  Commonly known as:  VITAMIN D  Take 1,000 Units by mouth every morning.     dexamethasone 4 MG tablet  Commonly known as:  DECADRON  Take 5 tablets every week before chemotherapy injection.     ezetimibe 10 MG tablet  Commonly known as:  ZETIA  Take 1 tablet (10 mg total) by mouth at bedtime.     feeding supplement (ENSURE ENLIVE) Liqd  Take 237 mLs by mouth 3 (three) times daily between meals.     Fish Oil 1200 MG Caps  Take 1,200-2,400 mg by mouth 2 (two) times daily. Take 2 capsules (2400 mg) every morning and 1 capsule (1200 mg) every night     megestrol 400 MG/10ML suspension  Commonly known as:  MEGACE   Take 10 mLs (400 mg total) by mouth 2 (two) times daily.     nitroGLYCERIN 0.4 MG SL tablet  Commonly known as:  NITROSTAT  Place 0.4 mg under the tongue every 5 (five) minutes as needed for chest pain.     pantoprazole 40 MG tablet  Commonly known as:  PROTONIX  Take 1 tablet (40 mg total) by mouth 2 (two) times daily.     polyethylene glycol powder powder  Commonly known as:  GLYCOLAX/MIRALAX  Take 17 g by mouth every morning. Mix with 8 oz liquid and drink     prochlorperazine 10 MG tablet  Commonly known as:  COMPAZINE  Take 10 mg by mouth 3 (three) times daily. As needed for nausea/vomiting     RA SENNA 8.6 MG tablet  Generic drug:  senna  Take 2 tablets by mouth at bedtime.     tiotropium 18 MCG inhalation capsule  Commonly known as:  SPIRIVA  Place 18 mcg into inhaler and inhale daily as needed (shortness of breath).     zolpidem 5 MG tablet  Commonly known as:  AMBIEN  Take 5 mg by mouth at bedtime as needed for sleep.        Discharge Condition:  Disposition: SNF    Consults:  Cardiology Neurology   Significant Diagnostic Studies: Ct Head Wo Contrast  09/10/2014   CLINICAL DATA:  Initial evaluation for acute extremity weakness.  EXAM: CT HEAD WITHOUT CONTRAST  TECHNIQUE: Contiguous axial images were obtained from the base of the skull through the vertex without intravenous contrast.  COMPARISON:  Prior study from 08/13/2014.  FINDINGS: Generalized cerebral atrophy with chronic microvascular ischemic disease present, similar to previous. Prominent vascular calcifications present within the carotid siphons.  No acute large vessel territory infarct. No intracranial hemorrhage. No mass lesion or midline shift. No hydrocephalus. No extra-axial fluid collection.  Scalp soft tissues within normal limits. No acute abnormality about the orbits.  Paranasal sinuses are clear.  No mastoid effusion.  Calvarium intact.  IMPRESSION: 1. No acute intracranial process. 2.  Generalized cerebral atrophy with mild chronic microvascular ischemic disease, similar to previous.   Electronically Signed   By: Jeannine Boga M.D.   On: 09/10/2014 02:54   Mr Brain Wo Contrast  09/10/2014   CLINICAL DATA:  81 year old hypertensive female with sudden onset of left leg weakness. Subsequent encounter.  EXAM: MRI HEAD WITHOUT CONTRAST  MRA HEAD WITHOUT CONTRAST  TECHNIQUE: Multiplanar, multiecho pulse sequences of the brain and surrounding structures were obtained without intravenous contrast. Angiographic images of the head were obtained using MRA technique without contrast.  COMPARISON:  09/10/2014 head CT.  05/24/2014 brain MR.  FINDINGS: MRI HEAD FINDINGS  Exam is motion degraded.  No acute infarct.  No intracranial hemorrhage.  Mild small vessel disease type changes.  Global atrophy without hydrocephalus.  No intracranial mass lesion noted on this unenhanced exam.  Heterogeneous bone marrow unchanged. This may be related to patient's underlying anemia.  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  Mild to slight moderate narrowing distal right vertebral artery.  Moderate narrowing mid basilar artery.  Nonvisualized posterior inferior cerebellar arteries.  Mild narrowing and irregularity of portions of the posterior cerebral arteries bilaterally.  Moderate narrowing of portions of the cavernous segment and supraclinoid segment of the internal carotid arteries greater on the right. Associated areas of ectasia without saccular aneurysm.  Mild narrowing and irregularity M1 segment left middle cerebral artery. Mild narrowing right middle cerebral artery bifurcation. Moderate narrowing middle cerebral artery branch vessels bilaterally.  Mild narrowing and irregularity A1 segment anterior cerebral artery bilaterally.  IMPRESSION: Exam is motion degraded.  No acute infarct.  Small vessel disease type changes.  Bone marrow changes suggestive of result of underlying  anemia.  Prominent intracranial atherosclerotic type changes as detailed above.   Electronically Signed   By: Genia Del M.D.   On: 09/10/2014 15:31   Ct Biopsy  08/30/2014   CLINICAL DATA:  Anemia of chronic disease, assess for myeloma.  EXAM: CT GUIDED RIGHT ILIAC BONE MARROW ASPIRATION AND CORE BIOPSY  Date:  5/25/20165/25/2016 9:56 am  Radiologist:  M. Daryll Brod, MD  Guidance:  CT  FLUOROSCOPY TIME:  None.  MEDICATIONS AND MEDICAL HISTORY: 0.5 mg Versed, 25 mcg fat  ANESTHESIA/SEDATION: 15 minutes  CONTRAST:  None.  COMPLICATIONS: None immediate  PROCEDURE: Informed consent was obtained from the patient following explanation of the procedure, risks, benefits and alternatives. The patient understands, agrees and consents for the procedure. All questions were addressed. A time out was performed.  The patient was positioned prone and noncontrast localization CT was performed of the pelvis to demonstrate the iliac marrow spaces.  Maximal barrier sterile technique utilized  including caps, mask, sterile gowns, sterile gloves, large sterile drape, hand hygiene, and betadine prep.  Under sterile conditions and local anesthesia, an 11 gauge coaxial bone biopsy needle was advanced into the right iliac marrow space. Needle position was confirmed with CT imaging. Initially, bone marrow aspiration was performed. Next, the 11 gauge outer cannula was utilized to obtain a right iliac bone marrow core biopsy. Needle was removed. Hemostasis was obtained with compression. The patient tolerated the procedure well. Samples were prepared with the cytotechnologist. No immediate complications.  IMPRESSION: CT guided right iliac bone marrow aspiration and core biopsy.   Electronically Signed   By: Jerilynn Mages.  Shick M.D.   On: 08/30/2014 10:06   Dg Chest Port 1 View  08/15/2014   CLINICAL DATA:  Shortness of breath this morning.  EXAM: PORTABLE CHEST - 1 VIEW  COMPARISON:  08/13/2014  FINDINGS: Sternotomy wires unchanged. Lungs are  adequately inflated without focal consolidation or effusion. Mild stable blunting of the left costophrenic angle. Stable borderline cardiomegaly. Remainder of the exam is unchanged.  IMPRESSION: No active disease.   Electronically Signed   By: Marin Olp M.D.   On: 08/15/2014 08:20   [name]  09/10/2014   CLINICAL DATA:  81 year old hypertensive female with sudden onset of left leg weakness. Subsequent encounter.  EXAM: MRI HEAD WITHOUT CONTRAST  MRA HEAD WITHOUT CONTRAST  TECHNIQUE: Multiplanar, multiecho pulse sequences of the brain and surrounding structures were obtained without intravenous contrast. Angiographic images of the head were obtained using MRA technique without contrast.  COMPARISON:  09/10/2014 head CT.  05/24/2014 brain MR.  FINDINGS: MRI HEAD FINDINGS  Exam is motion degraded.  No acute infarct.  No intracranial hemorrhage.  Mild small vessel disease type changes.  Global atrophy without hydrocephalus.  No intracranial mass lesion noted on this unenhanced exam.  Heterogeneous bone marrow unchanged. This may be related to patient's underlying anemia.  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  Mild to slight moderate narrowing distal right vertebral artery.  Moderate narrowing mid basilar artery.  Nonvisualized posterior inferior cerebellar arteries.  Mild narrowing and irregularity of portions of the posterior cerebral arteries bilaterally.  Moderate narrowing of portions of the cavernous segment and supraclinoid segment of the internal carotid arteries greater on the right. Associated areas of ectasia without saccular aneurysm.  Mild narrowing and irregularity M1 segment left middle cerebral artery. Mild narrowing right middle cerebral artery bifurcation. Moderate narrowing middle cerebral artery branch vessels bilaterally.  Mild narrowing and irregularity A1 segment anterior cerebral artery bilaterally.  IMPRESSION: Exam is motion degraded.  No acute  infarct.  Small vessel disease type changes.  Bone marrow changes suggestive of result of underlying anemia.  Prominent intracranial atherosclerotic type changes as detailed above.   Electronically Signed   By: Genia Del M.D.   On: 09/10/2014 15:31      Microbiology: Recent Results (from the past 240 hour(s))  MRSA PCR Screening     Status: None   Collection Time: 09/10/14 12:27 PM  Result Value Ref Range Status   MRSA by PCR NEGATIVE NEGATIVE Final    Comment:        The GeneXpert MRSA Assay (FDA approved for NASAL specimens only), is one component of a comprehensive MRSA colonization surveillance program. It is not intended to diagnose MRSA infection nor to guide or monitor treatment for MRSA infections.   Clostridium Difficile by PCR (not at [facility])     Status: None  Collection Time: 09/12/14 10:19 AM  Result Value Ref Range Status   C difficile by pcr NEGATIVE NEGATIVE Final     Labs: Results for orders placed or performed during the hospital encounter of 09/09/14 (from the past 48 hour(s))  CBC     Status: Abnormal   Collection Time: 09/12/14  3:15 AM  Result Value Ref Range   WBC 1.9 (L) 4.0 - 10.5 K/uL   RBC 2.92 (L) 3.87 - 5.11 MIL/uL   Hemoglobin 9.1 (L) 12.0 - 15.0 g/dL   HCT 27.0 (L) 36.0 - 46.0 %   MCV 92.5 78.0 - 100.0 fL   MCH 31.2 26.0 - 34.0 pg   MCHC 33.7 30.0 - 36.0 g/dL   RDW 20.9 (H) 11.5 - 15.5 %   Platelets 92 (L) 150 - 400 K/uL    Comment: CONSISTENT WITH PREVIOUS RESULT  Clostridium Difficile by PCR (not at [facility])     Status: None   Collection Time: 09/12/14 10:19 AM  Result Value Ref Range   C difficile by pcr NEGATIVE NEGATIVE     HPI :81 y.o. female, with a past medical history of COPD, coronary artery disease status post CABG, and multiple myeloma. She presented to the emergency department on the evening of 6/5 with left lower extremity weakness and a right-sided headache. In the ER she was noted to have bradycardia, frequently dropping  into the 20s and 30s. She reported palpitations over the preceding 6 months that lasted 30+ minutes once every 2-3 weeks. These were associated with lightheadedness. As noted in the H&P the patient had multiple previous admissions in the last 6-7 months. She was admitted by cardiology for bradycardia. Pacemaker was considered. Fortunately the patient was monitored on telemetry and improved. With a pulse rate consistently in the 60s and 70s. Dr. Johnsie Cancel saw patient on 6/6 and felt that she was stable for outpatient follow-up from a cardiac standpoint. At that time he called [facility] to assume primary care of the patient on 6/7. The patient was also evaluated by neurology for TIA. She is in the process of undergoing her TIA workup. This morning on my exam the patient is lethargic and unable to stand without a significant amount of assistance. I have talked with her daughter Butch Penny who is a full-time Administrator and unable to care for her mother at home.  HOSPITAL COURSE:  R hemisphere TIA   Resultant left lower extremity weakness resolved  MRI No acute stroke  MRA No large vessel stenosis. small vessel disease  Carotid Doppler No significant stenosis  2D Echo EF 50-55%  LE venous Doppler no DVT  LDL 63  HgbA1c 5.6, WNL  Lovenox for VTE prophylaxis  Diet Heart Room service appropriate?: Yes; Fluid consistency:: Thin  aspirin 325 mg orally every day prior to admission, now on aspirin 325 mg orally every day.   Ongoing aggressive stroke risk factor management  Therapy recommendations: SNF   Bradycardia   Cardiology on board, no indication for pacemaker, avoid AV nodal blocking drugs, no paroxysmal atrial fibrillation, not a candidate for anticoagulation given age, falls, anemia with myeloma and need for chemotherapy    HTN Patient was doing well on HCTZ but dropped into the 22L systolic multiple times yesterday 6/6. HCTZ discontinued. Would likely just  completely discontinue HCTZ in this frail elderly lady prone to dehydration, who has CKD.  BP currently normal 127/49.   Hyperlipidemia  Home meds: Zetia 10 mg daily resumed in hospital  LDL 63,  goal < 70  Continue Zetia at discharge  Other Stroke Risk Factors  Advanced age  Cigarette smoker, quit smoking in 2012  ETOH use - occasional glass of wine  Coronary artery disease s/p CABG  Other Active Problems  Multiple myeloma pending chemotherapy, originally planned for this week, received 1 unit of packed red blood cells in the ED  Anemia due to MM requiring transfusion  Mild Thrombocytopenia  Renal insufficiency, Cr 1.21  Leukocytosis 6.8>1.9   NO FURTHER STROKE WORKUP INDICATED  Patient has a 10-15% risk of having another stroke over the next year, the highest risk is within 2 weeks of the most recent stroke/TIA (risk of having a stroke following a stroke or TIA is the same).  Ongoing risk factor control by Primary Care Physician  Stroke Service will sign off. Please call should any needs arise.  Follow-up Stroke Clinic at [facility] with Dr. Rosalin Hawking in 2 months   CKD Creatinine at baseline.  D/C HCTZ   Discharge Exam:    Blood pressure 115/53, pulse 66, temperature 98.8 F (37.1 C), temperature source Oral, resp. rate 18, height _0  (1.549 m), weight 58.5 kg (128 lb 15.5 oz), SpO2 92 %.   General: Elderly frail female. Disoriented. Wet with urine. Attempting to stand to use the side commode but needs full assistance to do so  Eyes: PER, normal lids, irises & conjunctiva  ENT: grossly normal hearing, lips & tongue  Neck: no LAD, masses or thyromegaly  Cardiovascular: RRR, no m/r/g. No LE edema.  Respiratory: CTA bilaterally, no w/r/r. Normal respiratory effort.  Abdomen: soft, nt, nd,no masses, positive bowel sounds.  Skin: no rash or induration seen on limited exam  Musculoskeletal: no swelling noted. Able to move  all 4 extremities. Patient has significant generalized weakness  Psychiatric: grossly normal mood and affect, speech fluent and appropriate  Neurologic: grossly non-focal.       Discharge Instructions    Ambulatory referral to Neurology    Complete by:  As directed   Dr. Erlinda Hong requests followup in 2 months     Diet - low sodium heart healthy    Complete by:  As directed      Increase activity slowly    Complete by:  As directed            Follow-up Information    Follow up with SETHI,PRAMOD, MD In 2 months.   Specialties:  Neurology, Radiology   Why:  Stroke Clinic, Office will call you with appointment date & time   Contact information:   [address] [place] [address] [phone]       Signed: Reyne Dumas 09/13/2014, 11:31 AM

## 2014-09-13 NOTE — Progress Notes (Signed)
RN called report to [facility]. Patient has been informed and understands decision to go to a skilled nursing facility.

## 2014-09-13 NOTE — Care Management Note (Signed)
Case Management Note  Patient Details  Name: [name] MRN: [id number] Date of Birth: 06/20/1931  Subjective/Objective:                    Action/Plan:   Expected Discharge Date:                  Expected Discharge Plan:  [place]  In-House Referral:  Clinical Social Work  Discharge planning Services  CM Consult  Post Acute Care Choice:    Choice offered to:     DME Arranged:    DME Agency:     HH Arranged:    [place]:  [employer]  Status of Service:  Completed, signed off  Medicare Important Message Given:   yes Date Medicare IM Given:   09-13-14 Medicare IM give by:   Jacqlyn Krauss, RN,BSN Date Additional Medicare IM Given:    Additional Medicare Important Message give by:     If discussed at [place] of Stay Meetings, dates discussed:    Additional Comments:  Bethena Roys, RN 09/13/2014, 3:15 PM

## 2014-09-13 NOTE — Progress Notes (Signed)
Occupational Therapy Treatment Patient Details Name: [name] MRN: [id number] DOB: 10/05/1931 Today's Date: 09/13/2014    History of present illness pt presents with L LE weakness and HTN.  pt with hx of Bradycardia, multiple Myeloma, COPD.  MRI negative    OT comments  Pt eager to discharge to SNF and begin rehab.  Continues to have decreased activity tolerance and impaired endurance and does not have 24 hour assist at home.    Follow Up Recommendations  SNF    Equipment Recommendations  None recommended by OT    Recommendations for Other Services      Precautions / Restrictions Precautions Precautions: Fall       Mobility Bed Mobility               General bed mobility comments: pt up in chair and returned to chair  Transfers Overall transfer level: Needs assistance Equipment used: 1 person hand held assist Transfers: Sit to/from Stand Sit to Stand: Min assist              Balance                                   ADL Overall ADL's : Needs assistance/impaired     Grooming: Min guard;Standing;Wash/dry hands;Brushing hair                   Toilet Transfer: Minimal assistance;Ambulation;BSC Toilet Transfer Details (indicate cue type and reason): hand held assist to ambulate to [facility] and return to chair Toileting- Clothing Manipulation and Hygiene: Minimal assistance;Sit to/from stand       Functional mobility during ADLs: Minimal assistance General ADL Comments: instructed in pacing and energy conservation strategies      Vision                     Perception     Praxis      Cognition   Behavior During Therapy: [facility] for tasks assessed/performed Overall Cognitive Status: Within Functional Limits for tasks assessed                       Extremity/Trunk Assessment               Exercises     Shoulder Instructions       General Comments      Pertinent Vitals/ Pain       Pain  Assessment: Faces Faces Pain Scale: Hurts a little bit Pain Location: R hip Pain Descriptors / Indicators: Sore Pain Intervention(s): Monitored during session  Home Living                                          Prior Functioning/Environment              Frequency Min 2X/week     Progress Toward Goals  OT Goals(current goals can now be found in the care plan section)  Progress towards OT goals: Not progressing toward goals - comment (continues to remain weak, plan is for SNF)  Acute Rehab OT Goals Patient Stated Goal: go home Potential to Achieve Goals: Good  Plan Discharge plan needs to be updated    Co-evaluation                 End of  Session Equipment Utilized During Treatment: Oxygen (2L)   Activity Tolerance Patient limited by fatigue   Patient Left in chair;with call bell/phone within reach;with chair alarm set   Nurse Communication          Time: 1430-1445 OT Time Calculation (min): 15 min  Charges: OT General Charges $OT Visit: 1 Procedure OT Treatments $Self Care/Home Management : 8-22 mins  Malka So 09/13/2014, 2:51 PM

## 2014-09-13 NOTE — Clinical Social Work Placement (Signed)
   CLINICAL SOCIAL WORK PLACEMENT  NOTE  Date:  09/13/2014  Patient Details  Name: [name] MRN: [id number] Date of Birth: 1931/09/04  Clinical Social Work is seeking post-discharge placement for this patient at the [place] level of care (*CSW will initial, date and re-position this form in  chart as items are completed):  Yes   Patient/family provided with [place] Work Department's list of facilities offering this level of care within the geographic area requested by the patient (or if unable, by the patient's family).  Yes   Patient/family informed of their freedom to choose among providers that offer the needed level of care, that participate in Medicare, Medicaid or managed care program needed by the patient, have an available bed and are willing to accept the patient.  Yes   Patient/family informed of 's ownership interest in [facility] and [facility], as well as of the fact that they are under no obligation to receive care at these facilities.  PASRR submitted to EDS on  (n/a)     PASRR number received on  (n/a)     Existing PASRR number confirmed on 09/12/14     FL2 transmitted to all facilities in geographic area requested by pt/family on 09/12/14     FL2 transmitted to all facilities within larger geographic area on  (n/a)     Patient informed that his/her managed care company has contracts with or will negotiate with certain facilities, including the following:   (yes)     Yes   Patient/family informed of bed offers received.  Patient chooses bed at [facility]     Physician recommends and patient chooses bed at      Patient to be transferred to [facility] on 09/13/14.  Patient to be transferred to facility by [facility]     Patient family notified on 09/13/14 of transfer.  Name of family member notified:  Mauricio Po, daughter     PHYSICIAN       Additional  Comment:    _______________________________________________ Caroline Sauger, LCSW 09/13/2014, 1:43 PM [phone]

## 2014-09-14 MED ORDER — ACYCLOVIR 400 MG PO TABS: 400.0000 mg | ORAL_TABLET | Freq: Two times a day (BID) | ORAL | Status: DC

## 2014-09-15 ENCOUNTER — Other Ambulatory Visit: Payer: Self-pay | Admitting: *Deleted

## 2014-09-15 ENCOUNTER — Ambulatory Visit: Payer: Medicare Other

## 2014-09-15 ENCOUNTER — Other Ambulatory Visit: Payer: Medicare Other

## 2014-09-22 ENCOUNTER — Telehealth: Payer: Self-pay | Admitting: Oncology

## 2014-09-22 ENCOUNTER — Other Ambulatory Visit (HOSPITAL_BASED_OUTPATIENT_CLINIC_OR_DEPARTMENT_OTHER): Payer: Medicare Other

## 2014-09-22 ENCOUNTER — Ambulatory Visit (HOSPITAL_BASED_OUTPATIENT_CLINIC_OR_DEPARTMENT_OTHER): Payer: Medicare Other

## 2014-09-22 ENCOUNTER — Ambulatory Visit (HOSPITAL_BASED_OUTPATIENT_CLINIC_OR_DEPARTMENT_OTHER): Payer: Medicare Other | Admitting: Physician Assistant

## 2014-09-22 VITALS — BP 142/67 | HR 70 | Temp 97.6°F | Resp 18 | Ht 60.0 in | Wt 122.2 lb

## 2014-09-22 DIAGNOSIS — G8929 Other chronic pain: Secondary | ICD-10-CM

## 2014-09-22 DIAGNOSIS — Z5112 Encounter for antineoplastic immunotherapy: Secondary | ICD-10-CM

## 2014-09-22 DIAGNOSIS — D649 Anemia, unspecified: Secondary | ICD-10-CM | POA: Diagnosis not present

## 2014-09-22 DIAGNOSIS — M545 Low back pain: Secondary | ICD-10-CM

## 2014-09-22 DIAGNOSIS — R5383 Other fatigue: Secondary | ICD-10-CM

## 2014-09-22 DIAGNOSIS — R29898 Other symptoms and signs involving the musculoskeletal system: Secondary | ICD-10-CM

## 2014-09-22 DIAGNOSIS — R634 Abnormal weight loss: Secondary | ICD-10-CM

## 2014-09-22 DIAGNOSIS — C9000 Multiple myeloma not having achieved remission: Secondary | ICD-10-CM | POA: Diagnosis not present

## 2014-09-22 LAB — CBC WITH DIFFERENTIAL/PLATELET
BASO%: 0.3 % (ref 0.0–2.0)
BASOS ABS: 0 10*3/uL (ref 0.0–0.1)
EOS ABS: 0 10*3/uL (ref 0.0–0.5)
EOS%: 0 % (ref 0.0–7.0)
HCT: 28.2 % — ABNORMAL LOW (ref 34.8–46.6)
HGB: 9.5 g/dL — ABNORMAL LOW (ref 11.6–15.9)
LYMPH%: 22.5 % (ref 14.0–49.7)
MCH: 31.5 pg (ref 25.1–34.0)
MCHC: 33.7 g/dL (ref 31.5–36.0)
MCV: 93.3 fL (ref 79.5–101.0)
MONO#: 0.1 10*3/uL (ref 0.1–0.9)
MONO%: 3.6 % (ref 0.0–14.0)
NEUT#: 2.9 10*3/uL (ref 1.5–6.5)
NEUT%: 73.6 % (ref 38.4–76.8)
PLATELETS: 119 10*3/uL — AB (ref 145–400)
RBC: 3.03 10*6/uL — AB (ref 3.70–5.45)
RDW: 21.2 % — ABNORMAL HIGH (ref 11.2–14.5)
WBC: 3.9 10*3/uL (ref 3.9–10.3)
lymph#: 0.9 10*3/uL (ref 0.9–3.3)

## 2014-09-22 LAB — COMPREHENSIVE METABOLIC PANEL (CC13)
ALK PHOS: 58 U/L (ref 40–150)
ALT: 19 U/L (ref 0–55)
AST: 19 U/L (ref 5–34)
Albumin: 3.7 g/dL (ref 3.5–5.0)
Anion Gap: 6 mEq/L (ref 3–11)
BUN: 22.7 mg/dL (ref 7.0–26.0)
CO2: 24 mEq/L (ref 22–29)
CREATININE: 1.2 mg/dL — AB (ref 0.6–1.1)
Calcium: 9.8 mg/dL (ref 8.4–10.4)
Chloride: 105 mEq/L (ref 98–109)
EGFR: 42 mL/min/{1.73_m2} — ABNORMAL LOW (ref >90)
Glucose: 203 mg/dl — ABNORMAL HIGH (ref 70–140)
Potassium: 4.2 mEq/L (ref 3.5–5.1)
SODIUM: 134 meq/L — AB (ref 136–145)
Total Bilirubin: 0.74 mg/dL (ref 0.20–1.20)
Total Protein: 9.6 g/dL — ABNORMAL HIGH (ref 6.4–8.3)

## 2014-09-22 MED ORDER — ONDANSETRON HCL 8 MG PO TABS
ORAL_TABLET | ORAL | Status: AC
  Filled 2014-09-22: qty 1

## 2014-09-22 MED ORDER — BORTEZOMIB CHEMO SQ INJECTION 3.5 MG (2.5MG/ML)
1.3000 mg/m2 | Freq: Once | INTRAMUSCULAR | Status: AC
  Administered 2014-09-22: 2 mg via SUBCUTANEOUS
  Filled 2014-09-22: qty 2

## 2014-09-22 MED ORDER — ONDANSETRON HCL 8 MG PO TABS
8.0000 mg | ORAL_TABLET | Freq: Once | ORAL | Status: AC
  Administered 2014-09-22: 8 mg via ORAL

## 2014-09-22 NOTE — Patient Instructions (Signed)
[facility] Discharge Instructions for Patients Receiving Chemotherapy  Today you received the following chemotherapy agents:  Velcade  To help prevent nausea and vomiting after your treatment, we encourage you to take your nausea medication as prescribed.   If you develop nausea and vomiting that is not controlled by your nausea medication, call the clinic.   BELOW ARE SYMPTOMS THAT SHOULD BE REPORTED IMMEDIATELY:  *FEVER GREATER THAN 100.5 F  *CHILLS WITH OR WITHOUT FEVER  NAUSEA AND VOMITING THAT IS NOT CONTROLLED WITH YOUR NAUSEA MEDICATION  *UNUSUAL SHORTNESS OF BREATH  *UNUSUAL BRUISING OR BLEEDING  TENDERNESS IN MOUTH AND THROAT WITH OR WITHOUT PRESENCE OF ULCERS  *URINARY PROBLEMS  *BOWEL PROBLEMS  UNUSUAL RASH Items with * indicate a potential emergency and should be followed up as soon as possible.  Feel free to call the clinic you have any questions or concerns. The clinic phone number is [phone].  Please show the CHEMO ALERT CARD at check-in to the Emergency Department and triage nurse.   

## 2014-09-22 NOTE — Telephone Encounter (Signed)
Gave and printed appt sched and avs for pt for June and July  °

## 2014-09-26 LAB — SPEP & IFE WITH QIG
ABNORMAL PROTEIN BAND1: 3.1 g/dL
Albumin ELP: 4.4 g/dL (ref 3.8–4.8)
Alpha-1-Globulin: 0.4 g/dL — ABNORMAL HIGH (ref 0.2–0.3)
Alpha-2-Globulin: 0.9 g/dL (ref 0.5–0.9)
BETA GLOBULIN: 0.4 g/dL (ref 0.4–0.6)
Beta 2: 0.3 g/dL (ref 0.2–0.5)
GAMMA GLOBULIN: 3.3 g/dL — AB (ref 0.8–1.7)
IGA: 30 mg/dL — AB (ref 69–380)
IgG (Immunoglobin G), Serum: 3650 mg/dL — ABNORMAL HIGH (ref 690–1700)
IgM, Serum: 25 mg/dL — ABNORMAL LOW (ref 52–322)
Total Protein, Serum Electrophoresis: 9.6 g/dL — ABNORMAL HIGH (ref 6.1–8.1)

## 2014-09-27 NOTE — Patient Instructions (Signed)
Continue labs and chemotherapy as scheduled.  Remember to take your dexamethasone the morning of your chemotherapy with food Follow-up in 2 weeks

## 2014-09-27 NOTE — Progress Notes (Signed)
Hematology and Oncology Follow Up Visit  [name] [id number] 04-24-1931 81 y.o. 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 She was found to have IgG level of 3430 and an M spike of 2.9 g/dL on 07/20/2014. Bone marrow biopsy on 08/30/2014 showed 78% plasma cell dyscrasia.   Prior Therapy: Status post packed red cell transfusions during hospitalization in February 2016. This was repeated in May 2016.  She is status post a bone marrow biopsy on 08/30/2014.  Current therapy:  Systemic therapy with Velcade and dexamethasone weekly regimen starting on 09/22/2014.  Interim History: [name] presents today for a follow-up visit accompanied by her daughter. She is currently at [facility] facility for physical therapy/rehabilitation after hospital admission for sudden onset of leg weakness. She reports that she doesn't take the care for the food and therefore is not eating as well as she should. She is participating in her physical therapy. [name] and her daughter both understand that treatment for her plasma cell disorder is of a palliative nature and that this is not a curable disease. She continues to have some fatigue. She does not report any new pain at this time. She still has some occasional exertional dyspnea and occasional diarrhea. She does not report any new complaints. She has not reported any bone pain, pathological fracture or neuropathy. She is ready to proceed with chemotherapy as ordered.  She does not report any headaches, blurry vision, syncope or seizures. She does not report any fevers, chills, sweats. She does not report any chest pain, palpitation orthopnea. Does not report any cough, hemoptysis or hematemesis. She does report occasional nausea but no vomiting no hematochezia or melena. She does not report any frequency urgency or hesitancy. She does  report chronic back pain which is unchanged. Remaining review of systems unremarkable.   Medications: I have reviewed the patient's current medications.  Current Outpatient Prescriptions  Medication Sig Dispense Refill  . acetaminophen (TYLENOL) 325 MG tablet Take 2 tablets (650 mg total) by mouth every 6 (six) hours as needed for mild pain (or Fever >/= 101). 60 tablet 0  . acyclovir (ZOVIRAX) 400 MG tablet Take 1 tablet (400 mg total) by mouth 2 (two) times daily. 60 tablet 3  . albuterol-ipratropium (COMBIVENT) 18-103 MCG/ACT inhaler Inhale 1-2 puffs into the lungs every 4 (four) hours as needed for wheezing or shortness of breath.    [name] aspirin 325 MG EC tablet Take 325 mg by mouth every morning.     . cholecalciferol (VITAMIN D) 1000 UNITS tablet Take 1,000 Units by mouth every morning.     [name] dexamethasone (DECADRON) 4 MG tablet Take 5 tablets every week before chemotherapy injection. 40 tablet 3  . ezetimibe (ZETIA) 10 MG tablet Take 1 tablet (10 mg total) by mouth at bedtime.    . feeding supplement, ENSURE ENLIVE, (ENSURE ENLIVE) LIQD Take 237 mLs by mouth 3 (three) times daily between meals. 237 mL 12  . megestrol (MEGACE) 400 MG/10ML suspension Take 10 mLs (400 mg total) by mouth 2 (two) times daily. 240 mL 0  . nitroGLYCERIN (NITROSTAT) 0.4 MG SL tablet Place 0.4 mg under the tongue every 5 (five) minutes as needed for chest pain.    . Omega-3 Fatty Acids (FISH OIL) 1200 MG CAPS Take 1,200-2,400 mg by mouth 2 (two) times daily. Take 2 capsules (2400 mg) every morning and 1 capsule (1200  mg) every night    . pantoprazole (PROTONIX) 40 MG tablet Take 1 tablet (40 mg total) by mouth 2 (two) times daily.  0  . polyethylene glycol powder (GLYCOLAX/MIRALAX) powder Take 17 g by mouth every morning. Mix with 8 oz liquid and drink  0  . prochlorperazine (COMPAZINE) 10 MG tablet Take 10 mg by mouth 3 (three) times daily. As needed for nausea/vomiting  0  . RA SENNA 8.6 MG tablet Take 2 tablets by  mouth at bedtime.   0  . tiotropium (SPIRIVA) 18 MCG inhalation capsule Place 18 mcg into inhaler and inhale daily as needed (shortness of breath).     . zolpidem (AMBIEN) 5 MG tablet Take 5 mg by mouth at bedtime as needed for sleep.     No current facility-administered medications for this visit.     Allergies:  Allergies  Allergen Reactions  . Lexapro [Escitalopram Oxalate] Other (See Comments)    Fatigue   . Soma [Carisoprodol] Other (See Comments)    Fatigue   . Wellbutrin [Bupropion] Other (See Comments)    "couldn't function and take it"  . Albuterol Sulfate Other (See Comments)    Albuterol HFA inhaler caused nervousness   . Codeine Hives  . Levaquin [Levofloxacin In D5w] Other (See Comments)    Unknown allergic reaction  . Plavix [Clopidogrel Bisulfate] Other (See Comments)    Unknown reaction  . Statins     Past Medical History, Surgical history, Social history, and Family History were reviewed and updated.   Physical Exam: Blood pressure 142/67, pulse 70, temperature 97.6 F (36.4 C), temperature source Oral, resp. rate 18, height 5' (1.524 m), weight 122 lb 3.2 oz (55.43 kg), SpO2 99 %. ECOG: 1 General appearance: alert and cooperative. Chronically ill-appearing. Head: Normocephalic, without obvious abnormality Neck: no adenopathy Lymph nodes: Cervical, supraclavicular, and axillary nodes normal. Heart:regular rate and rhythm, S1, S2 normal, no murmur, click, rub or gallop Lung:chest clear, no wheezing, rales, normal symmetric air entry Abdomin: soft, non-tender, without masses or organomegaly EXT:no erythema, induration, or nodules   Lab Results: Lab Results  Component Value Date   WBC 3.9 09/22/2014   HGB 9.5* 09/22/2014   HCT 28.2* 09/22/2014   MCV 93.3 09/22/2014   PLT 119* 09/22/2014     Chemistry      Component Value Date/Time   NA 134* 09/22/2014 1426   NA 133* 09/11/2014 1025   K 4.2 09/22/2014 1426   K 3.3* 09/11/2014 1025   CL 104  09/11/2014 1025   CO2 24 09/22/2014 1426   CO2 24 09/11/2014 1025   BUN 22.7 09/22/2014 1426   BUN 23* 09/11/2014 1025   CREATININE 1.2* 09/22/2014 1426   CREATININE 1.21* 09/11/2014 1025      Component Value Date/Time   CALCIUM 9.8 09/22/2014 1426   CALCIUM 9.0 09/11/2014 1025   ALKPHOS 58 09/22/2014 1426   ALKPHOS 58 09/09/2014 2330   AST 19 09/22/2014 1426   AST 22 09/09/2014 2330   ALT 19 09/22/2014 1426   ALT 14 09/09/2014 2330   BILITOT 0.74 09/22/2014 1426   BILITOT 0.5 09/09/2014 2330         Impression and Plan:  81 year old woman with the following issues:  1. IgG kappa plasma cell disorder with 78% plasma cell infiltration in the bone marrow. She is quite symptomatic and certainly requires treatment. She meets the criteria of multiple myeloma given her anemia and overall symptomatology.  The natural course of this disease was  discussed with the patient and her daughter extensively. She understand that this is an incurable malignancy and any treatment would be palliative. She is not a transplant candidate and  Could not  tolerate a 3 drug regimen. Options of treatments will be systemic chemotherapy versus hospice care were previously discussed by Dr. Alen Blew. She will move forward with her first weekly Velcade today as scheduled. She is willing to proceed with treatment to see how she tolerates it but may also reconsider hospice referral if she feels the treatment is taking too much out of her.  2. Anemia: Related to plasma cell disorder. Her hemoglobin is  9.5 today. We will continue to monitor this closely and arrange packed red blood cell transfusion as needed.   3. Weight loss: I gave her prescription for Megace with instructions to use it. Complications include the pain thrombosis were discussed with the patient regarding this medication.  4. Follow-up: Will be in 2 weeks to assess complications from this therapy.   Carlton Adam, PA-C  6/22/20162:26 PM

## 2014-09-28 ENCOUNTER — Telehealth: Payer: Self-pay

## 2014-09-28 NOTE — Telephone Encounter (Signed)
Pt is staying at [facility]. She stated she had no adverse reactions to the Velcade. No nausea, no vomiting, able to eat and drink. Pt asked who was picking her up for her appt tomorrow. This RN will call [facility] to verify transportation. [facility] stated she is to be discharged tomorrow. Called daughter Butch Penny. Butch Penny stated she called [facility] on Tues. and cannot bring her mother home at present b/c she is dealing with a bad mold problem in her walls. Butch Penny is questioning where to take her mother. Told her to call [facility] again and I would let out social worker know to see if [facility] can help. Reminded Butch Penny of pt's appt at [facility] tomorrow.

## 2014-09-28 NOTE — Telephone Encounter (Signed)
-----   Message from Renford Dills, RN sent at 09/22/2014  3:48 PM EDT ----- Regarding: chemo f/u call  Dr. Alen Blew 1st Velcade

## 2014-09-29 ENCOUNTER — Ambulatory Visit (HOSPITAL_BASED_OUTPATIENT_CLINIC_OR_DEPARTMENT_OTHER): Payer: Medicare Other

## 2014-09-29 ENCOUNTER — Other Ambulatory Visit (HOSPITAL_BASED_OUTPATIENT_CLINIC_OR_DEPARTMENT_OTHER): Payer: Medicare Other

## 2014-09-29 VITALS — BP 153/53 | HR 53 | Temp 97.2°F | Resp 20

## 2014-09-29 DIAGNOSIS — C9000 Multiple myeloma not having achieved remission: Secondary | ICD-10-CM

## 2014-09-29 LAB — COMPREHENSIVE METABOLIC PANEL (CC13)
ALBUMIN: 3.7 g/dL (ref 3.5–5.0)
ALK PHOS: 63 U/L (ref 40–150)
ALT: 27 U/L (ref 0–55)
AST: 27 U/L (ref 5–34)
Anion Gap: 7 mEq/L (ref 3–11)
BUN: 26.9 mg/dL — ABNORMAL HIGH (ref 7.0–26.0)
CO2: 23 meq/L (ref 22–29)
Calcium: 9.3 mg/dL (ref 8.4–10.4)
Chloride: 106 mEq/L (ref 98–109)
Creatinine: 1.2 mg/dL — ABNORMAL HIGH (ref 0.6–1.1)
EGFR: 42 mL/min/{1.73_m2} — ABNORMAL LOW (ref >90)
Glucose: 153 mg/dl — ABNORMAL HIGH (ref 70–140)
POTASSIUM: 4.3 meq/L (ref 3.5–5.1)
SODIUM: 136 meq/L (ref 136–145)
TOTAL PROTEIN: 9.3 g/dL — AB (ref 6.4–8.3)
Total Bilirubin: 0.57 mg/dL (ref 0.20–1.20)

## 2014-09-29 LAB — CBC WITH DIFFERENTIAL/PLATELET
BASO%: 0.3 % (ref 0.0–2.0)
Basophils Absolute: 0 10*3/uL (ref 0.0–0.1)
EOS ABS: 0 10*3/uL (ref 0.0–0.5)
EOS%: 0.1 % (ref 0.0–7.0)
HCT: 29.1 % — ABNORMAL LOW (ref 34.8–46.6)
HGB: 9.8 g/dL — ABNORMAL LOW (ref 11.6–15.9)
LYMPH%: 25.1 % (ref 14.0–49.7)
MCH: 32.6 pg (ref 25.1–34.0)
MCHC: 33.7 g/dL (ref 31.5–36.0)
MCV: 96.7 fL (ref 79.5–101.0)
MONO#: 0.1 10*3/uL (ref 0.1–0.9)
MONO%: 4 % (ref 0.0–14.0)
NEUT#: 2.6 10*3/uL (ref 1.5–6.5)
NEUT%: 70.5 % (ref 38.4–76.8)
Platelets: 146 10*3/uL (ref 145–400)
RBC: 3.01 10*6/uL — AB (ref 3.70–5.45)
RDW: 22.8 % — AB (ref 11.2–14.5)
WBC: 3.7 10*3/uL — ABNORMAL LOW (ref 3.9–10.3)
lymph#: 0.9 10*3/uL (ref 0.9–3.3)

## 2014-09-29 MED ORDER — ONDANSETRON HCL 8 MG PO TABS
8.0000 mg | ORAL_TABLET | Freq: Once | ORAL | Status: AC
  Administered 2014-09-29: 8 mg via ORAL

## 2014-09-29 MED ORDER — BORTEZOMIB CHEMO SQ INJECTION 3.5 MG (2.5MG/ML)
1.3000 mg/m2 | Freq: Once | INTRAMUSCULAR | Status: AC
  Administered 2014-09-29: 2 mg via SUBCUTANEOUS
  Filled 2014-09-29: qty 2

## 2014-09-29 MED ORDER — ONDANSETRON HCL 8 MG PO TABS
ORAL_TABLET | ORAL | Status: AC
  Filled 2014-09-29: qty 1

## 2014-09-29 NOTE — Patient Instructions (Signed)
[facility] Discharge Instructions for Patients Receiving Chemotherapy  Today you received the following chemotherapy agents Velcade.  To help prevent nausea and vomiting after your treatment, we encourage you to take your nausea medication as prescribed.   If you develop nausea and vomiting that is not controlled by your nausea medication, call the clinic.   BELOW ARE SYMPTOMS THAT SHOULD BE REPORTED IMMEDIATELY:  *FEVER GREATER THAN 100.5 F  *CHILLS WITH OR WITHOUT FEVER  NAUSEA AND VOMITING THAT IS NOT CONTROLLED WITH YOUR NAUSEA MEDICATION  *UNUSUAL SHORTNESS OF BREATH  *UNUSUAL BRUISING OR BLEEDING  TENDERNESS IN MOUTH AND THROAT WITH OR WITHOUT PRESENCE OF ULCERS  *URINARY PROBLEMS  *BOWEL PROBLEMS  UNUSUAL RASH Items with * indicate a potential emergency and should be followed up as soon as possible.  Feel free to call the clinic you have any questions or concerns. The clinic phone number is [phone].  Please show the Organ at check-in to the Emergency Department and triage nurse.

## 2014-10-04 ENCOUNTER — Encounter: Payer: Self-pay | Admitting: *Deleted

## 2014-10-04 NOTE — Progress Notes (Signed)
[place] Work  Clinical Social Work was referred by medical oncology RN for assessment of psychosocial needs.  RN shared daughter currently in need of housing for patient, patient can no longer stay at [facility], but cannot live with daughter due to a mold issue.  Clinical Social Worker contacted patient's daughter by phone to offer support and assess for needs.  Patient's daughter, Butch Penny, shared patient is living at a friends house and has home care services.  CSW encouraged patient or daughter to call with any questions or future concerns.     Polo Riley, MSW, LCSW, OSW-C Clinical Social Worker [facility] [phone]

## 2014-10-06 ENCOUNTER — Other Ambulatory Visit (HOSPITAL_BASED_OUTPATIENT_CLINIC_OR_DEPARTMENT_OTHER): Payer: Medicare Other

## 2014-10-06 ENCOUNTER — Ambulatory Visit (HOSPITAL_BASED_OUTPATIENT_CLINIC_OR_DEPARTMENT_OTHER): Payer: Medicare Other

## 2014-10-06 ENCOUNTER — Other Ambulatory Visit: Payer: Self-pay | Admitting: Oncology

## 2014-10-06 ENCOUNTER — Ambulatory Visit (HOSPITAL_BASED_OUTPATIENT_CLINIC_OR_DEPARTMENT_OTHER): Payer: Medicare Other | Admitting: Oncology

## 2014-10-06 ENCOUNTER — Telehealth: Payer: Self-pay | Admitting: Oncology

## 2014-10-06 VITALS — BP 138/61 | HR 109 | Temp 97.8°F | Resp 18 | Ht 60.0 in | Wt 126.9 lb

## 2014-10-06 DIAGNOSIS — Z5112 Encounter for antineoplastic immunotherapy: Secondary | ICD-10-CM | POA: Diagnosis not present

## 2014-10-06 DIAGNOSIS — C9000 Multiple myeloma not having achieved remission: Secondary | ICD-10-CM

## 2014-10-06 LAB — CBC WITH DIFFERENTIAL/PLATELET
BASO%: 0 % (ref 0.0–2.0)
Basophils Absolute: 0 10*3/uL (ref 0.0–0.1)
EOS%: 0 % (ref 0.0–7.0)
Eosinophils Absolute: 0 10*3/uL (ref 0.0–0.5)
HEMATOCRIT: 27 % — AB (ref 34.8–46.6)
HGB: 9.1 g/dL — ABNORMAL LOW (ref 11.6–15.9)
LYMPH%: 24.2 % (ref 14.0–49.7)
MCH: 33.1 pg (ref 25.1–34.0)
MCHC: 33.7 g/dL (ref 31.5–36.0)
MCV: 98.2 fL (ref 79.5–101.0)
MONO#: 0.2 10*3/uL (ref 0.1–0.9)
MONO%: 3.6 % (ref 0.0–14.0)
NEUT#: 3.3 10*3/uL (ref 1.5–6.5)
NEUT%: 72.2 % (ref 38.4–76.8)
Platelets: 97 10*3/uL — ABNORMAL LOW (ref 145–400)
RBC: 2.75 10*6/uL — ABNORMAL LOW (ref 3.70–5.45)
RDW: 21.5 % — ABNORMAL HIGH (ref 11.2–14.5)
WBC: 4.5 10*3/uL (ref 3.9–10.3)
lymph#: 1.1 10*3/uL (ref 0.9–3.3)

## 2014-10-06 LAB — COMPREHENSIVE METABOLIC PANEL (CC13)
ALBUMIN: 3.3 g/dL — AB (ref 3.5–5.0)
ALK PHOS: 69 U/L (ref 40–150)
ALT: 29 U/L (ref 0–55)
ANION GAP: 10 meq/L (ref 3–11)
AST: 36 U/L — AB (ref 5–34)
BUN: 24.6 mg/dL (ref 7.0–26.0)
CHLORIDE: 106 meq/L (ref 98–109)
CO2: 21 mEq/L — ABNORMAL LOW (ref 22–29)
Calcium: 9.2 mg/dL (ref 8.4–10.4)
Creatinine: 1.1 mg/dL (ref 0.6–1.1)
EGFR: 49 mL/min/{1.73_m2} — AB (ref >90)
Glucose: 180 mg/dl — ABNORMAL HIGH (ref 70–140)
Potassium: 4.5 mEq/L (ref 3.5–5.1)
Sodium: 137 mEq/L (ref 136–145)
Total Bilirubin: 0.8 mg/dL (ref 0.20–1.20)
Total Protein: 8.7 g/dL — ABNORMAL HIGH (ref 6.4–8.3)

## 2014-10-06 MED ORDER — ONDANSETRON HCL 8 MG PO TABS
ORAL_TABLET | ORAL | Status: AC
  Filled 2014-10-06: qty 1

## 2014-10-06 MED ORDER — ONDANSETRON HCL 8 MG PO TABS
8.0000 mg | ORAL_TABLET | Freq: Once | ORAL | Status: AC
  Administered 2014-10-06: 8 mg via ORAL

## 2014-10-06 MED ORDER — BORTEZOMIB CHEMO SQ INJECTION 3.5 MG (2.5MG/ML)
1.3000 mg/m2 | Freq: Once | INTRAMUSCULAR | Status: AC
  Administered 2014-10-06: 2 mg via SUBCUTANEOUS
  Filled 2014-10-06: qty 2

## 2014-10-06 NOTE — Progress Notes (Signed)
Hematology and Oncology Follow Up Visit  [name] [id number] 30-Nov-1931 81 y.o. 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 She was found to have IgG level of 3430 and an M spike of 2.9 g/dL on 07/20/2014. Bone marrow biopsy on 08/30/2014 showed 78% plasma cell dyscrasia.   Prior Therapy: Status post packed red cell transfusions during hospitalization in February 2016. This was repeated in May 2016.  She is status post a bone marrow biopsy on 08/30/2014.  Current therapy:  Systemic therapy with Velcade and dexamethasone weekly regimen starting on 09/22/2014. She is here for week 3 of therapy.   Interim History: [name] presents today for a follow-up visit accompanied by her daughter. Since the last visit, she have tolerated this treatment without any complications. She have reported slight lower extremity edema and increase in her appetite. She has gained some weight as well. She still have some diffuse pain which is been chronic in nature. She still has some occasional exertional dyspnea and occasional diarrhea. She does not report any new complaints. She has not reported any bone pain, pathological fracture or neuropathy. She is ready to proceed with chemotherapy as ordered.  She does not report any headaches, blurry vision, syncope or seizures. She does not report any fevers, chills, sweats. She does not report any chest pain, palpitation orthopnea. Does not report any cough, hemoptysis or hematemesis. She does report occasional nausea but no vomiting no hematochezia or melena. She does not report any frequency urgency or hesitancy. She does report chronic back pain which is unchanged. Remaining review of systems unremarkable.   Medications: I have reviewed the patient's current medications.  Current Outpatient Prescriptions  Medication Sig Dispense Refill  . acetaminophen  (TYLENOL) 325 MG tablet Take 2 tablets (650 mg total) by mouth every 6 (six) hours as needed for mild pain (or Fever >/= 101). 60 tablet 0  . acyclovir (ZOVIRAX) 400 MG tablet Take 1 tablet (400 mg total) by mouth 2 (two) times daily. 60 tablet 3  . albuterol-ipratropium (COMBIVENT) 18-103 MCG/ACT inhaler Inhale 1-2 puffs into the lungs every 4 (four) hours as needed for wheezing or shortness of breath.    [name] aspirin 325 MG EC tablet Take 325 mg by mouth every morning.     . cholecalciferol (VITAMIN D) 1000 UNITS tablet Take 1,000 Units by mouth every morning.     [name] dexamethasone (DECADRON) 4 MG tablet Take 5 tablets every week before chemotherapy injection. 40 tablet 3  . ezetimibe (ZETIA) 10 MG tablet Take 1 tablet (10 mg total) by mouth at bedtime.    . feeding supplement, ENSURE ENLIVE, (ENSURE ENLIVE) LIQD Take 237 mLs by mouth 3 (three) times daily between meals. 237 mL 12  . megestrol (MEGACE) 400 MG/10ML suspension Take 10 mLs (400 mg total) by mouth 2 (two) times daily. 240 mL 0  . nitroGLYCERIN (NITROSTAT) 0.4 MG SL tablet Place 0.4 mg under the tongue every 5 (five) minutes as needed for chest pain.    . Omega-3 Fatty Acids (FISH OIL) 1200 MG CAPS Take 1,200-2,400 mg by mouth 2 (two) times daily. Take 2 capsules (2400 mg) every morning and 1 capsule (1200 mg) every night    . pantoprazole (PROTONIX) 40 MG tablet Take 1 tablet (40 mg total) by mouth 2 (two) times daily.  0  . polyethylene glycol powder (GLYCOLAX/MIRALAX) powder Take 17 g by mouth every  morning. Mix with 8 oz liquid and drink  0  . prochlorperazine (COMPAZINE) 10 MG tablet Take 10 mg by mouth 3 (three) times daily. As needed for nausea/vomiting  0  . RA SENNA 8.6 MG tablet Take 2 tablets by mouth at bedtime.   0  . tiotropium (SPIRIVA) 18 MCG inhalation capsule Place 18 mcg into inhaler and inhale daily as needed (shortness of breath).     . zolpidem (AMBIEN) 5 MG tablet Take 5 mg by mouth at bedtime as needed for sleep.      No current facility-administered medications for this visit.     Allergies:  Allergies  Allergen Reactions  . Lexapro [Escitalopram Oxalate] Other (See Comments)    Fatigue   . Soma [Carisoprodol] Other (See Comments)    Fatigue   . Wellbutrin [Bupropion] Other (See Comments)    "couldn't function and take it"  . Albuterol Sulfate Other (See Comments)    Albuterol HFA inhaler caused nervousness   . Codeine Hives  . Levaquin [Levofloxacin In D5w] Other (See Comments)    Unknown allergic reaction  . Plavix [Clopidogrel Bisulfate] Other (See Comments)    Unknown reaction  . Statins     Past Medical History, Surgical history, Social history, and Family History were reviewed and updated.   Physical Exam: Blood pressure 138/61, pulse 109, temperature 97.8 F (36.6 C), temperature source Oral, resp. rate 18, height 5' (1.524 m), weight 126 lb 14.4 oz (57.561 kg), SpO2 96 %. ECOG: 1 General appearance: alert and cooperative. Elderly woman without any distress today. Head: Normocephalic, without obvious abnormality Neck: no adenopathy Lymph nodes: Cervical, supraclavicular, and axillary nodes normal. Heart:regular rate and rhythm, S1, S2 normal, no murmur, click, rub or gallop Lung:chest clear, no wheezing, rales, normal symmetric air entry Abdomin: soft, non-tender, without masses or organomegaly EXT:no erythema, induration, or nodules   Lab Results: Lab Results  Component Value Date   WBC 4.5 10/06/2014   HGB 9.1* 10/06/2014   HCT 27.0* 10/06/2014   MCV 98.2 10/06/2014   PLT 97* 10/06/2014     Chemistry      Component Value Date/Time   NA 137 10/06/2014 1429   NA 133* 09/11/2014 1025   K 4.5 10/06/2014 1429   K 3.3* 09/11/2014 1025   CL 104 09/11/2014 1025   CO2 21* 10/06/2014 1429   CO2 24 09/11/2014 1025   BUN 24.6 10/06/2014 1429   BUN 23* 09/11/2014 1025   CREATININE 1.1 10/06/2014 1429   CREATININE 1.21* 09/11/2014 1025      Component Value Date/Time    CALCIUM 9.2 10/06/2014 1429   CALCIUM 9.0 09/11/2014 1025   ALKPHOS 69 10/06/2014 1429   ALKPHOS 58 09/09/2014 2330   AST 36* 10/06/2014 1429   AST 22 09/09/2014 2330   ALT 29 10/06/2014 1429   ALT 14 09/09/2014 2330   BILITOT 0.80 10/06/2014 1429   BILITOT 0.5 09/09/2014 2330         Impression and Plan:  81 year old woman with the following issues:  1. IgG kappa plasma cell disorder with 78% plasma cell infiltration in the bone marrow. She is quite symptomatic and certainly requires treatment. She meets the criteria of multiple myeloma given her anemia and overall symptomatology.  She is currently receiving Velcade and dexamethasone and have tolerated it well. The plan is to proceed with the treatment today as she have no objections. I will recheck her protein studies periodically to assess for response. It is too early at  this time to fully estimate that. Her total protein is declining which is a good sign. I have always given her the option to stop treatment and enrolling on hospice if she wishes.  2. Anemia: Related to plasma cell disorder. Her hemoglobin is  9.1 today. We will continue to monitor this closely and arrange packed red blood cell transfusion as needed.   3. Weight loss: Appetite is better since the start of Megace she continued to gain weight.  4. Thrombocytopenia: Related to Velcade no bleeding noted we'll continue to monitor for future counts.  5. Follow-up: Will be on a weekly basis for her injection.   Zola Button, MD 7/1/20163:15 PM

## 2014-10-06 NOTE — Telephone Encounter (Signed)
Pt confirmed labs/ov per 07/01 POF, gave pt AVS and Calendar.Cherylann Banas, sent msg to add chemo

## 2014-10-06 NOTE — Patient Instructions (Signed)
[facility] Discharge Instructions for Patients Receiving Chemotherapy  Today you received the following chemotherapy agents:  Velcade  To help prevent nausea and vomiting after your treatment, we encourage you to take your nausea medication as prescribed.   If you develop nausea and vomiting that is not controlled by your nausea medication, call the clinic.   BELOW ARE SYMPTOMS THAT SHOULD BE REPORTED IMMEDIATELY:  *FEVER GREATER THAN 100.5 F  *CHILLS WITH OR WITHOUT FEVER  NAUSEA AND VOMITING THAT IS NOT CONTROLLED WITH YOUR NAUSEA MEDICATION  *UNUSUAL SHORTNESS OF BREATH  *UNUSUAL BRUISING OR BLEEDING  TENDERNESS IN MOUTH AND THROAT WITH OR WITHOUT PRESENCE OF ULCERS  *URINARY PROBLEMS  *BOWEL PROBLEMS  UNUSUAL RASH Items with * indicate a potential emergency and should be followed up as soon as possible.  Feel free to call the clinic you have any questions or concerns. The clinic phone number is [phone].  Please show the CHEMO ALERT CARD at check-in to the Emergency Department and triage nurse.   

## 2014-10-06 NOTE — Progress Notes (Signed)
Pt saw Dr. Alen Blew prior to chemo today.  Proceed with chemo as per md.

## 2014-10-09 ENCOUNTER — Other Ambulatory Visit: Payer: Self-pay

## 2014-10-09 ENCOUNTER — Encounter (HOSPITAL_COMMUNITY): Payer: Self-pay | Admitting: Emergency Medicine

## 2014-10-09 ENCOUNTER — Observation Stay (HOSPITAL_COMMUNITY)
Admission: EM | Admit: 2014-10-09 | Discharge: 2014-10-13 | Disposition: A | Discharge status: Skilled Nursing Facility | Payer: Medicare Other | Attending: Family Medicine | Admitting: Family Medicine

## 2014-10-09 ENCOUNTER — Emergency Department (HOSPITAL_COMMUNITY): Payer: Medicare Other

## 2014-10-09 DIAGNOSIS — I252 Old myocardial infarction: Secondary | ICD-10-CM | POA: Diagnosis not present

## 2014-10-09 DIAGNOSIS — M549 Dorsalgia, unspecified: Secondary | ICD-10-CM | POA: Diagnosis not present

## 2014-10-09 DIAGNOSIS — M109 Gout, unspecified: Secondary | ICD-10-CM | POA: Diagnosis not present

## 2014-10-09 DIAGNOSIS — R748 Abnormal levels of other serum enzymes: Secondary | ICD-10-CM | POA: Insufficient documentation

## 2014-10-09 DIAGNOSIS — Z66 Do not resuscitate: Secondary | ICD-10-CM | POA: Insufficient documentation

## 2014-10-09 DIAGNOSIS — C9000 Multiple myeloma not having achieved remission: Secondary | ICD-10-CM

## 2014-10-09 DIAGNOSIS — I482 Chronic atrial fibrillation: Secondary | ICD-10-CM | POA: Diagnosis not present

## 2014-10-09 DIAGNOSIS — R079 Chest pain, unspecified: Secondary | ICD-10-CM | POA: Diagnosis not present

## 2014-10-09 DIAGNOSIS — F329 Major depressive disorder, single episode, unspecified: Secondary | ICD-10-CM | POA: Insufficient documentation

## 2014-10-09 DIAGNOSIS — Z87891 Personal history of nicotine dependence: Secondary | ICD-10-CM | POA: Diagnosis not present

## 2014-10-09 DIAGNOSIS — I4891 Unspecified atrial fibrillation: Secondary | ICD-10-CM | POA: Diagnosis present

## 2014-10-09 DIAGNOSIS — R0602 Shortness of breath: Secondary | ICD-10-CM | POA: Insufficient documentation

## 2014-10-09 DIAGNOSIS — R778 Other specified abnormalities of plasma proteins: Secondary | ICD-10-CM | POA: Diagnosis present

## 2014-10-09 DIAGNOSIS — I25810 Atherosclerosis of coronary artery bypass graft(s) without angina pectoris: Secondary | ICD-10-CM | POA: Insufficient documentation

## 2014-10-09 DIAGNOSIS — Z7982 Long term (current) use of aspirin: Secondary | ICD-10-CM | POA: Diagnosis not present

## 2014-10-09 DIAGNOSIS — I495 Sick sinus syndrome: Secondary | ICD-10-CM | POA: Diagnosis present

## 2014-10-09 DIAGNOSIS — Z888 Allergy status to other drugs, medicaments and biological substances status: Secondary | ICD-10-CM | POA: Insufficient documentation

## 2014-10-09 DIAGNOSIS — Z885 Allergy status to narcotic agent status: Secondary | ICD-10-CM | POA: Diagnosis not present

## 2014-10-09 DIAGNOSIS — I10 Essential (primary) hypertension: Secondary | ICD-10-CM | POA: Diagnosis not present

## 2014-10-09 DIAGNOSIS — J449 Chronic obstructive pulmonary disease, unspecified: Secondary | ICD-10-CM | POA: Insufficient documentation

## 2014-10-09 DIAGNOSIS — T451X5A Adverse effect of antineoplastic and immunosuppressive drugs, initial encounter: Secondary | ICD-10-CM

## 2014-10-09 DIAGNOSIS — G8929 Other chronic pain: Secondary | ICD-10-CM | POA: Insufficient documentation

## 2014-10-09 DIAGNOSIS — E43 Unspecified severe protein-calorie malnutrition: Secondary | ICD-10-CM | POA: Diagnosis present

## 2014-10-09 DIAGNOSIS — I129 Hypertensive chronic kidney disease with stage 1 through stage 4 chronic kidney disease, or unspecified chronic kidney disease: Secondary | ICD-10-CM | POA: Diagnosis not present

## 2014-10-09 DIAGNOSIS — N183 Chronic kidney disease, stage 3 (moderate): Secondary | ICD-10-CM | POA: Insufficient documentation

## 2014-10-09 DIAGNOSIS — D61810 Antineoplastic chemotherapy induced pancytopenia: Secondary | ICD-10-CM | POA: Diagnosis not present

## 2014-10-09 DIAGNOSIS — R0789 Other chest pain: Secondary | ICD-10-CM | POA: Diagnosis not present

## 2014-10-09 DIAGNOSIS — D61818 Other pancytopenia: Secondary | ICD-10-CM | POA: Diagnosis not present

## 2014-10-09 DIAGNOSIS — R7989 Other specified abnormal findings of blood chemistry: Secondary | ICD-10-CM

## 2014-10-09 DIAGNOSIS — R791 Abnormal coagulation profile: Secondary | ICD-10-CM | POA: Diagnosis not present

## 2014-10-09 HISTORY — DX: Unspecified atrial fibrillation: I48.91

## 2014-10-09 LAB — CBC
HCT: 27 % — ABNORMAL LOW (ref 36.0–46.0)
HEMOGLOBIN: 9 g/dL — AB (ref 12.0–15.0)
MCH: 32.8 pg (ref 26.0–34.0)
MCHC: 33.3 g/dL (ref 30.0–36.0)
MCV: 98.5 fL (ref 78.0–100.0)
PLATELETS: 86 10*3/uL — AB (ref 150–400)
RBC: 2.74 MIL/uL — ABNORMAL LOW (ref 3.87–5.11)
RDW: 21.3 % — AB (ref 11.5–15.5)
WBC: 2.5 10*3/uL — AB (ref 4.0–10.5)

## 2014-10-09 LAB — TROPONIN I
TROPONIN I: 0.1 ng/mL — AB (ref <0.031)
Troponin I: 0.11 ng/mL — ABNORMAL HIGH (ref <0.031)

## 2014-10-09 LAB — BASIC METABOLIC PANEL
Anion gap: 8 (ref 5–15)
BUN: 28 mg/dL — AB (ref 6–20)
CHLORIDE: 106 mmol/L (ref 101–111)
CO2: 22 mmol/L (ref 22–32)
CREATININE: 1.01 mg/dL — AB (ref 0.44–1.00)
Calcium: 8.5 mg/dL — ABNORMAL LOW (ref 8.9–10.3)
GFR calc Af Amer: 59 mL/min — ABNORMAL LOW (ref >60)
GFR calc non Af Amer: 51 mL/min — ABNORMAL LOW (ref >60)
Glucose, Bld: 121 mg/dL — ABNORMAL HIGH (ref 65–99)
Potassium: 3.4 mmol/L — ABNORMAL LOW (ref 3.5–5.1)
Sodium: 136 mmol/L (ref 135–145)

## 2014-10-09 LAB — HEPATIC FUNCTION PANEL
ALK PHOS: 48 U/L (ref 38–126)
ALT: 21 U/L (ref 14–54)
AST: 25 U/L (ref 15–41)
Albumin: 3.1 g/dL — ABNORMAL LOW (ref 3.5–5.0)
BILIRUBIN TOTAL: 0.5 mg/dL (ref 0.3–1.2)
Bilirubin, Direct: 0.1 mg/dL (ref 0.1–0.5)
Indirect Bilirubin: 0.4 mg/dL (ref 0.3–0.9)
Total Protein: 7.4 g/dL (ref 6.5–8.1)

## 2014-10-09 LAB — LIPASE, BLOOD: Lipase: 25 U/L (ref 22–51)

## 2014-10-09 LAB — I-STAT TROPONIN, ED: Troponin i, poc: 0.11 ng/mL (ref 0.00–0.08)

## 2014-10-09 LAB — BRAIN NATRIURETIC PEPTIDE: B Natriuretic Peptide: 537.4 pg/mL — ABNORMAL HIGH (ref 0.0–100.0)

## 2014-10-09 LAB — D-DIMER, QUANTITATIVE (NOT AT ARMC): D DIMER QUANT: 2.88 ug{FEU}/mL — AB (ref 0.00–0.48)

## 2014-10-09 MED ORDER — TIOTROPIUM BROMIDE MONOHYDRATE 18 MCG IN CAPS
18.0000 ug | ORAL_CAPSULE | Freq: Every day | RESPIRATORY_TRACT | Status: DC | PRN
Start: 2014-10-09 — End: 2014-10-13

## 2014-10-09 MED ORDER — PANTOPRAZOLE SODIUM 40 MG PO TBEC
40.0000 mg | DELAYED_RELEASE_TABLET | Freq: Two times a day (BID) | ORAL | Status: DC
Start: 2014-10-09 — End: 2014-10-13
  Administered 2014-10-09 – 2014-10-13 (×8): 40 mg via ORAL
  Filled 2014-10-09 (×8): qty 1

## 2014-10-09 MED ORDER — NITROGLYCERIN 0.4 MG SL SUBL: 0.4000 mg | SUBLINGUAL_TABLET | SUBLINGUAL | Status: DC | PRN

## 2014-10-09 MED ORDER — POLYETHYLENE GLYCOL 3350 17 GM/SCOOP PO POWD
17.0000 g | Freq: Every day | ORAL | Status: DC
  Filled 2014-10-09: qty 255

## 2014-10-09 MED ORDER — ASPIRIN 81 MG PO CHEW: 324.0000 mg | CHEWABLE_TABLET | Freq: Once | ORAL | Status: DC

## 2014-10-09 MED ORDER — TRAMADOL HCL 50 MG PO TABS
50.0000 mg | ORAL_TABLET | Freq: Four times a day (QID) | ORAL | Status: DC | PRN
Start: 2014-10-09 — End: 2014-10-13
  Administered 2014-10-12: 50 mg via ORAL
  Filled 2014-10-09: qty 1

## 2014-10-09 MED ORDER — ONDANSETRON HCL 4 MG/2ML IJ SOLN: 4.0000 mg | Freq: Four times a day (QID) | INTRAMUSCULAR | Status: DC | PRN

## 2014-10-09 MED ORDER — FLEET ENEMA 7-19 GM/118ML RE ENEM
1.0000 | ENEMA | Freq: Once | RECTAL | Status: AC
  Administered 2014-10-09: 1 via RECTAL
  Filled 2014-10-09: qty 1

## 2014-10-09 MED ORDER — PROCHLORPERAZINE MALEATE 10 MG PO TABS
10.0000 mg | ORAL_TABLET | Freq: Three times a day (TID) | ORAL | Status: DC
  Administered 2014-10-09 – 2014-10-13 (×12): 10 mg via ORAL
  Filled 2014-10-09 (×11): qty 1

## 2014-10-09 MED ORDER — IPRATROPIUM-ALBUTEROL 18-103 MCG/ACT IN AERO: 1.0000 | INHALATION_SPRAY | RESPIRATORY_TRACT | Status: DC | PRN

## 2014-10-09 MED ORDER — MEGESTROL ACETATE 400 MG/10ML PO SUSP
400.0000 mg | Freq: Two times a day (BID) | ORAL | Status: DC
  Administered 2014-10-09 – 2014-10-13 (×8): 400 mg via ORAL
  Filled 2014-10-09 (×8): qty 10

## 2014-10-09 MED ORDER — IPRATROPIUM-ALBUTEROL 0.5-2.5 (3) MG/3ML IN SOLN
3.0000 mL | RESPIRATORY_TRACT | Status: DC | PRN
  Administered 2014-10-11: 3 mL via RESPIRATORY_TRACT
  Filled 2014-10-09: qty 3

## 2014-10-09 MED ORDER — GI COCKTAIL ~~LOC~~: 30.0000 mL | Freq: Four times a day (QID) | ORAL | Status: DC | PRN

## 2014-10-09 MED ORDER — POLYETHYLENE GLYCOL 3350 17 G PO PACK
17.0000 g | PACK | Freq: Every day | ORAL | Status: DC
  Administered 2014-10-10 – 2014-10-13 (×4): 17 g via ORAL
  Filled 2014-10-09 (×5): qty 1

## 2014-10-09 MED ORDER — ACETAMINOPHEN 325 MG PO TABS
650.0000 mg | ORAL_TABLET | Freq: Four times a day (QID) | ORAL | Status: DC | PRN
  Administered 2014-10-11: 650 mg via ORAL
  Filled 2014-10-09: qty 2

## 2014-10-09 MED ORDER — ENOXAPARIN SODIUM 40 MG/0.4ML ~~LOC~~ SOLN
40.0000 mg | SUBCUTANEOUS | Status: DC
  Administered 2014-10-09: 40 mg via SUBCUTANEOUS
  Filled 2014-10-09 (×2): qty 0.4

## 2014-10-09 MED ORDER — ENSURE ENLIVE PO LIQD
237.0000 mL | Freq: Three times a day (TID) | ORAL | Status: DC
  Administered 2014-10-09 – 2014-10-12 (×5): 237 mL via ORAL

## 2014-10-09 MED ORDER — ZOLPIDEM TARTRATE 5 MG PO TABS
5.0000 mg | ORAL_TABLET | Freq: Every evening | ORAL | Status: DC | PRN
  Administered 2014-10-11 – 2014-10-12 (×2): 5 mg via ORAL
  Filled 2014-10-09 (×2): qty 1

## 2014-10-09 MED ORDER — ASPIRIN EC 325 MG PO TBEC
325.0000 mg | DELAYED_RELEASE_TABLET | Freq: Every morning | ORAL | Status: DC
  Administered 2014-10-10: 325 mg via ORAL
  Filled 2014-10-09: qty 1

## 2014-10-09 MED ORDER — EZETIMIBE 10 MG PO TABS
10.0000 mg | ORAL_TABLET | Freq: Every day | ORAL | Status: DC
  Administered 2014-10-09 – 2014-10-12 (×4): 10 mg via ORAL
  Filled 2014-10-09 (×4): qty 1

## 2014-10-09 NOTE — ED Notes (Signed)
Patient transported to X-ray 

## 2014-10-09 NOTE — ED Notes (Signed)
Rees at bedside. 

## 2014-10-09 NOTE — ED Provider Notes (Signed)
CSN: [id number]     Arrival date & time 10/09/14  1501 History   First MD Initiated Contact with Patient 10/09/14 1522     Chief Complaint  Patient presents with  . Chest Pain  . Weakness     Patient is a 81 y.o. female presenting with chest pain and weakness. The history is provided by the patient. No language interpreter was used.  Chest Pain Associated symptoms: weakness   Weakness Associated symptoms include chest pain.   Ms. Salatino presents for evaluation of chest pain. She states that she developed central chest pain last night when she was working to get out of the chair. She states that she was working very hard to get out of the chair because she has generalized weakness and she eventually needed help to get up. The pain is constant in nature and described as a dull ache. There are no alleviating or worsening factors. The pain has improved since yesterday. She is to use to feel generalized weakness. She denies any fevers, cough, shortness of breath, abdominal pain, nausea, vomiting, leg swelling or pain. She is currently undergoing chemotherapy for bone cancer. She has a history of cardiac disease. She takes an aspirin daily. Symptoms are moderate, constant, improving.  Past Medical History  Diagnosis Date  . COPD (chronic obstructive pulmonary disease)   . Gout   . Coronary atherosclerosis of native coronary artery 02/05/2012  . Hypertension   . High blood cholesterol   . Heart murmur     "all my life" (02/05/2012)  . Anginal pain   . Myocardial infarction 1990's; 2000's    "2 total, I think" (02/05/2012)  . Pneumonia     "several times" (02/05/2012)  . Chronic bronchitis   . Shortness of breath     "sometimes when I'm laying down; always when I do too much" (02/05/2012)  . History of blood transfusion   . External bleeding hemorrhoids     "act up at times" (02/05/2012)  . Chronic back pain     "all over" (02/05/2012)  . Depression   . CAD (coronary artery disease) of  artery bypass graft 02/09/14    atretic LIMA  . Multiple myeloma 09/07/2014   Past Surgical History  Procedure Laterality Date  . Appendectomy  ? 1970's  . Ectopic pregnancy surgery  1970's  . Abdominal hysterectomy  ? 1970's    "partial 1st time; complete 2nd" (02/05/2012)  . Cholecystectomy  ~ 2010  . Cataract extraction w/ intraocular lens  implant, bilateral  [phone]  . Coronary angioplasty with stent placement  2005  . Coronary artery bypass graft  2005    CABG X2  . Cardiac catheterization  02/09/14    atretic LIMA,  patent VG-dRCA and Total occlusion of the native RCA proximally prior to remotely placement RCA stent, with mild proximal 30% LAD stenosis and 30% distal circumflex stenoses.  EF 55%.  . Left heart catheterization with coronary/graft angiogram N/A 02/09/2014    Procedure: LEFT HEART CATHETERIZATION WITH Beatrix Fetters;  Surgeon: Troy Sine, MD;  Location: [facility] CATH LAB;  Service: Cardiovascular;  Laterality: N/A;  . Esophagogastroduodenoscopy (egd) with propofol Left 05/22/2014    Procedure: ESOPHAGOGASTRODUODENOSCOPY (EGD) WITH PROPOFOL;  Surgeon: Arta Silence, MD;  Location: [facility] ENDOSCOPY;  Service: Endoscopy;  Laterality: Left;   Family History  Problem Relation Age of Onset  . Anemia Neg Hx   . Arrhythmia Neg Hx   . Asthma Neg Hx   . Clotting disorder Neg Hx   .  Fainting Neg Hx   . Heart attack Neg Hx   . Heart disease Neg Hx   . Heart failure Neg Hx   . Hyperlipidemia Neg Hx   . Hypertension Neg Hx   . Aneurysm    . CVA    . CVA Mother   . Aneurysm Father    History  Substance Use Topics  . Smoking status: Former Smoker -- 1.00 packs/day for 50 years    Types: Cigarettes    Quit date: 04/02/2011  . Smokeless tobacco: Never Used  . Alcohol Use: Yes     Comment: says she drinks wine once in a while   OB History    No data available     Review of Systems  Cardiovascular: Positive for chest pain.  Neurological: Positive for  weakness.  All other systems reviewed and are negative.     Allergies  Lexapro; Soma; Wellbutrin; Albuterol sulfate; Codeine; Levaquin; Plavix; and Statins  Home Medications   Prior to Admission medications   Medication Sig Start Date End Date Taking? Authorizing Provider  acetaminophen (TYLENOL) 325 MG tablet Take 2 tablets (650 mg total) by mouth every 6 (six) hours as needed for mild pain (or Fever >/= 101). 09/13/14  Yes Reyne Dumas, MD  acyclovir (ZOVIRAX) 400 MG tablet Take 1 tablet (400 mg total) by mouth 2 (two) times daily. 09/14/14   Wyatt Portela, MD  albuterol-ipratropium (COMBIVENT) 18-103 MCG/ACT inhaler Inhale 1-2 puffs into the lungs every 4 (four) hours as needed for wheezing or shortness of breath.    Historical Provider, MD  aspirin 325 MG EC tablet Take 325 mg by mouth every morning.     Historical Provider, MD  cholecalciferol (VITAMIN D) 1000 UNITS tablet Take 1,000 Units by mouth every morning.     Historical Provider, MD  dexamethasone (DECADRON) 4 MG tablet Take 5 tablets every week before chemotherapy injection. 09/07/14   Wyatt Portela, MD  ezetimibe (ZETIA) 10 MG tablet Take 1 tablet (10 mg total) by mouth at bedtime. 05/25/14   Hosie Poisson, MD  feeding supplement, ENSURE ENLIVE, (ENSURE ENLIVE) LIQD Take 237 mLs by mouth 3 (three) times daily between meals. 09/13/14   Reyne Dumas, MD  megestrol (MEGACE) 400 MG/10ML suspension Take 10 mLs (400 mg total) by mouth 2 (two) times daily. 09/07/14   Wyatt Portela, MD  nitroGLYCERIN (NITROSTAT) 0.4 MG SL tablet Place 0.4 mg under the tongue every 5 (five) minutes as needed for chest pain.    Historical Provider, MD  Omega-3 Fatty Acids (FISH OIL) 1200 MG CAPS Take 1,200-2,400 mg by mouth 2 (two) times daily. Take 2 capsules (2400 mg) every morning and 1 capsule (1200 mg) every night    Historical Provider, MD  pantoprazole (PROTONIX) 40 MG tablet Take 1 tablet (40 mg total) by mouth 2 (two) times daily. 05/25/14   Hosie Poisson,  MD  polyethylene glycol powder (GLYCOLAX/MIRALAX) powder Take 17 g by mouth every morning. Mix with 8 oz liquid and drink 05/16/14   Historical Provider, MD  prochlorperazine (COMPAZINE) 10 MG tablet Take 10 mg by mouth 3 (three) times daily. As needed for nausea/vomiting 06/29/14   Historical Provider, MD  RA SENNA 8.6 MG tablet Take 2 tablets by mouth at bedtime.  02/07/14   Historical Provider, MD  tiotropium (SPIRIVA) 18 MCG inhalation capsule Place 18 mcg into inhaler and inhale daily as needed (shortness of breath).     Historical Provider, MD  zolpidem (AMBIEN) 5 MG  tablet Take 5 mg by mouth at bedtime as needed for sleep.    Historical Provider, MD   BP 116/65 mmHg  Pulse 97  Temp(Src) 97.6 F (36.4 C) (Oral)  Resp 24  SpO2 97% Physical Exam  Constitutional: She is oriented to person, place, and time. She appears well-developed and well-nourished.  HENT:  Head: Normocephalic and atraumatic.  Cardiovascular: Normal rate and regular rhythm.   No murmur heard. Pulmonary/Chest: Effort normal and breath sounds normal. No respiratory distress. She exhibits no tenderness.  Abdominal: Soft. There is no rebound and no guarding.  Mild epigastric/left upper quadrant tenderness  Musculoskeletal: She exhibits no edema or tenderness.  Neurological: She is alert and oriented to person, place, and time.  Skin: Skin is warm and dry.  Psychiatric: She has a normal mood and affect. Her behavior is normal.  Nursing note and vitals reviewed.   ED Course  Procedures (including critical care time) Labs Review Labs Reviewed  BASIC METABOLIC PANEL - Abnormal; Notable for the following:    Potassium 3.4 (*)    Glucose, Bld 121 (*)    BUN 28 (*)    Creatinine, Ser 1.01 (*)    Calcium 8.5 (*)    GFR calc non Af Amer 51 (*)    GFR calc Af Amer 59 (*)    All other components within normal limits  BRAIN NATRIURETIC PEPTIDE - Abnormal; Notable for the following:    B Natriuretic Peptide 537.4 (*)     All other components within normal limits  CBC - Abnormal; Notable for the following:    WBC 2.5 (*)    RBC 2.74 (*)    Hemoglobin 9.0 (*)    HCT 27.0 (*)    RDW 21.3 (*)    Platelets 86 (*)    All other components within normal limits  HEPATIC FUNCTION PANEL - Abnormal; Notable for the following:    Albumin 3.1 (*)    All other components within normal limits  TROPONIN I - Abnormal; Notable for the following:    Troponin I 0.11 (*)    All other components within normal limits  TROPONIN I - Abnormal; Notable for the following:    Troponin I 0.10 (*)    All other components within normal limits  D-DIMER, QUANTITATIVE (NOT AT [facility]) - Abnormal; Notable for the following:    D-Dimer, Quant 2.88 (*)    All other components within normal limits  I-STAT TROPOININ, ED - Abnormal; Notable for the following:    Troponin i, poc 0.11 (*)    All other components within normal limits  LIPASE, BLOOD  TROPONIN I    Imaging Review Dg Chest 2 View  10/09/2014   CLINICAL DATA:  Acute onset of generalized weakness and chest pain. Initial encounter.  EXAM: CHEST  2 VIEW  COMPARISON:  Chest radiograph performed earlier today at 3:39 p.m.  FINDINGS: The lungs are well-aerated. Mild vascular congestion is noted. Mild bibasilar atelectasis is seen. No pleural effusion or pneumothorax is identified.  The heart is borderline enlarged. The patient is status post median sternotomy. No acute osseous abnormalities are seen.  IMPRESSION: Mild vascular congestion and borderline cardiomegaly. Mild bibasilar atelectasis seen.   Electronically Signed   By: Roanna Raider M.D.   On: 10/09/2014 17:26   Dg Chest Port 1 View  10/09/2014   CLINICAL DATA:  Weakness and chest pain today. Prior myocardial infarction and multiple myeloma.  EXAM: PORTABLE CHEST - 1 VIEW  COMPARISON:  08/15/14  FINDINGS: Chin overlies the apices. Patient rotated right. Mild cardiomegaly. Prior median sternotomy. Right hemidiaphragm elevation. No  right-sided pleural effusion. No pneumothorax. Low lung volumes with resultant pulmonary interstitial prominence. Partial obscuration of the lateral left hemidiaphragm, new since the prior exam but similar in appearance to 08/13/2014. Right infrahilar volume loss and atelectasis.  IMPRESSION: Low lung volumes. Partial left hemidiaphragm obscuration laterally. Favored to be technique related or possibly due to atelectasis. Pneumonia cannot be excluded. Consider further evaluation with PA and lateral radiographs.   Electronically Signed   By: Abigail Miyamoto M.D.   On: 10/09/2014 15:49     EKG Interpretation None      Unable to upload EKG in MUSE.   ED ECG REPORT   Date: 10/09/2014 1615  Rate: 85  Rhythm: atrial fibrillation  QRS Axis: right  Intervals: normal  ST/T Wave abnormalities: nonspecific ST changes  Conduction Disutrbances:none  Narrative Interpretation:   Old EKG Reviewed: unchanged  I have personally reviewed the EKG tracing and agree with the computerized printout as noted.   MDM   Final diagnoses:  Chest pain, unspecified chest pain type   Pt here for chest pain that started with activity. Pain is currently resolved in the emergency department. Troponin is elevated. Discussed with Dr. Harl Bowie with cardiology, recommend admission to medicine service and cardiology will follow in consult.      Quintella Reichert, MD 10/10/14 [phone]

## 2014-10-09 NOTE — Consult Note (Signed)
Primary cardiologist: Dr Pernell Dupre Consulting cardiologist: Dr Carlyle Dolly  Clinical Summary Ms. Stemen is a 81 y.o.female history of CAD [facility] HTN, COPD, multiple myeloma currently on velcade admitted with generalized fatigue and chest pain.  Chest episode yesterday afternoon that occurred at rest. Pressure like pain 3/10 with some SOB. Lasted several hours, recurrent episode this AM that was similar. Also associated with significant fatigue.   Hgb 9, Plt 86, WBC 2.5, BNP 537, Cr 1.01, K 3.4, trop 0.11 CXR mild vascular congestion EKG afib rate 85, non-spec ST/T changes 02/2014 cath: atretic LIMA graft but patent LAD, occluded RCA with patent SVG-RCA graft 09/2014 echo: LVEF 60-65%, no WMAs  Allergies  Allergen Reactions  . Lexapro [Escitalopram Oxalate] Other (See Comments)    Fatigue   . Soma [Carisoprodol] Other (See Comments)    Fatigue   . Wellbutrin [Bupropion] Other (See Comments)    "couldn't function and take it"  . Albuterol Sulfate Other (See Comments)    Albuterol HFA inhaler caused nervousness   . Codeine Hives  . Levaquin [Levofloxacin In D5w] Other (See Comments)    Unknown allergic reaction  . Plavix [Clopidogrel Bisulfate] Other (See Comments)    Unknown reaction  . Statins     Medications Scheduled Medications: . aspirin  324 mg Oral Once     Infusions:     PRN Medications:     Past Medical History  Diagnosis Date  . COPD (chronic obstructive pulmonary disease)   . Gout   . Coronary atherosclerosis of native coronary artery 02/05/2012  . Hypertension   . High blood cholesterol   . Heart murmur     "all my life" (02/05/2012)  . Anginal pain   . Myocardial infarction 1990's; 2000's    "2 total, I think" (02/05/2012)  . Pneumonia     "several times" (02/05/2012)  . Chronic bronchitis   . Shortness of breath     "sometimes when I'm laying down; always when I do too much" (02/05/2012)  .  History of blood transfusion   . External bleeding hemorrhoids     "act up at times" (02/05/2012)  . Chronic back pain     "all over" (02/05/2012)  . Depression   . CAD (coronary artery disease) of artery bypass graft 02/09/14    atretic LIMA  . Multiple myeloma 09/07/2014    Past Surgical History  Procedure Laterality Date  . Appendectomy  ? 1970's  . Ectopic pregnancy surgery  1970's  . Abdominal hysterectomy  ? 1970's    "partial 1st time; complete 2nd" (02/05/2012)  . Cholecystectomy  ~ 2010  . Cataract extraction w/ intraocular lens  implant, bilateral  [phone]  . Coronary angioplasty with stent placement  2005  . Coronary artery bypass graft  2005    CABG X2  . Cardiac catheterization  02/09/14    atretic LIMA,  patent VG-dRCA and Total occlusion of the native RCA proximally prior to remotely placement RCA stent, with mild proximal 30% LAD stenosis and 30% distal circumflex stenoses.  EF 55%.  . Left heart catheterization with coronary/graft angiogram N/A 02/09/2014    Procedure: LEFT HEART CATHETERIZATION WITH Beatrix Fetters;  Surgeon: Troy Sine, MD;  Location: [facility] CATH LAB;  Service: Cardiovascular;  Laterality: N/A;  . Esophagogastroduodenoscopy (egd) with propofol Left 05/22/2014    Procedure: ESOPHAGOGASTRODUODENOSCOPY (EGD) WITH PROPOFOL;  Surgeon: Arta Silence, MD;  Location: [facility] ENDOSCOPY;  Service: Endoscopy;  Laterality: Left;  Family History  Problem Relation Age of Onset  . Anemia Neg Hx   . Arrhythmia Neg Hx   . Asthma Neg Hx   . Clotting disorder Neg Hx   . Fainting Neg Hx   . Heart attack Neg Hx   . Heart disease Neg Hx   . Heart failure Neg Hx   . Hyperlipidemia Neg Hx   . Hypertension Neg Hx   . Aneurysm    . CVA    . CVA Mother   . Aneurysm Father     Social History Ms. Suddreth reports that she quit smoking about 3 years ago. Her smoking use included Cigarettes. She has a 50 pack-year smoking history. She has never used  smokeless tobacco. Ms. Lessner reports that she drinks alcohol.  Review of Systems CONSTITUTIONAL: generalized fatigue HEENT: Eyes: No visual loss, blurred vision, double vision or yellow sclerae. No hearing loss, sneezing, congestion, runny nose or sore throat.  SKIN: No rash or itching.  CARDIOVASCULAR: per HPI RESPIRATORY: No shortness of breath, cough or sputum.  GASTROINTESTINAL: No anorexia, nausea, vomiting or diarrhea. No abdominal pain or blood.  GENITOURINARY: no polyuria, no dysuria NEUROLOGICAL: No headache, dizziness, syncope, paralysis, ataxia, numbness or tingling in the extremities. No change in bowel or bladder control.  MUSCULOSKELETAL: No muscle, back pain, joint pain or stiffness.  HEMATOLOGIC: No anemia, bleeding or bruising.  LYMPHATICS: No enlarged nodes. No history of splenectomy.  PSYCHIATRIC: No history of depression or anxiety.      Physical Examination Blood pressure 118/57, pulse 90, temperature 97.8 F (36.6 C), temperature source Oral, resp. rate 20, SpO2 95 %. No intake or output data in the 24 hours ending 10/09/14 1748  HEENT: sclera clear  Cardiovascular: RRR, 2/6 systolic murmur LLSB, no JVD  Respiratory: CTAB  QZ:ESPQZRA soft,NT, ND  MSK: no LE edema  Neuro: no focal deficits  Psych: appropriate affect   Lab Results  Basic Metabolic Panel:  Recent Labs Lab 10/06/14 1429 10/09/14 1524  NA 137 136  K 4.5 3.4*  CL  --  106  CO2 21* 22  GLUCOSE 180* 121*  BUN 24.6 28*  CREATININE 1.1 1.01*  CALCIUM 9.2 8.5*    Liver Function Tests:  Recent Labs Lab 10/06/14 1429 10/09/14 1524  AST 36* 25  ALT 29 21  ALKPHOS 69 48  BILITOT 0.80 0.5  PROT 8.7* 7.4  ALBUMIN 3.3* 3.1*    CBC:  Recent Labs Lab 10/06/14 1429 10/09/14 1524  WBC 4.5 2.5*  NEUTROABS 3.3  --   HGB 9.1* 9.0*  HCT 27.0* 27.0*  MCV 98.2 98.5  PLT 97* 86*    Cardiac Enzymes: No results for input(s): CKTOTAL, CKMB, CKMBINDEX, TROPONINI in the last  168 hours.  BNP: Invalid input(s): POCBNP    Impression/Recommendations 1. Chest pain - fairly mixed symptoms, mild troponin elevation of 0.11 thus far. Nonspecific ST/T changes - echo with normal LVEF and no WMAs earlier this month, cath 02/2014 without significant obstructive disease - recommend cycling enzymes and EKG overnight to establish trend. Given thrombocytopenia, anemia and resolution of chest pain symptoms would not start anticoag at this point. Any potential intervention will need to be considered in the context of her multiple myeloma and pancytopenia - pending troponin trend, consider noninvasive vs invasive ischemic testing. Please make NPO tonight. - continue medical therapy at this time. No statin due to allergy, no beta blocker due to sick sinus,    2. Afib - from notes there has  been some question on whether she has had afib previously, fairly apparent from EKGs this admit. She has history of sick sinus, rates are normal, will not start av nodal agent - CHADS2Vasc score of 93 (age x2, gender, HTN, hx of TIA 09/2014), CAD). Once again must consider risks vs benefits in patient with chronic anemia and thrombocytopenia. Prior to starting anticoagwould need to discuss with her primary cardiologist Dr Tamala Julian and her primary oncologist [facility], will need to touch base after the holiday.    Carlyle Dolly, M.D.

## 2014-10-09 NOTE — ED Notes (Addendum)
Per EMS pt complaint of chest pain and weakness onset yesterday. Pt recent chemo treatment on Friday.  324 aspirin given en route with EMS.

## 2014-10-09 NOTE — H&P (Addendum)
History and Physical  [name] SJG:[id number] DOB: December 10, 1931 DOA: 10/09/2014  Referring physician: Varney Biles, ER physician PCP: [name], MD   Chief Complaint: Chest pressure  HPI: [name] is a 81 y.o. female  With past mental history of COPD, CAD and multiple myeloma who was in her usual state of health when she started having mild chest pressure on 7/3 afternoon. Patient was at rest. Pressure was left sternal, nonradiating elsewhere, some associated shortness of breath but no nausea or vomiting or dizziness. No syncope. It resolved on his own after several hours. When patient woke up this morning she started having a similar episode. She became concerned so she came into the emergency room for further evaluation. Chest pain soon resolved after getting to the emergency room without intervention. There lab work was done noting a BNP of 537, troponin of 0.11 and a mild chronic pancytopenia. EKG noted chronic atrial fibrillation, rate controlled. Cardiology and hospitalists were called for further evaluation.   Review of Systems:  Patient seen after arrival to floor. Pt complains of persistent shortness of breath with no coughing or wheezing. She's had no chest pain/pressure since soon after arrival to the emergency room. She also complains of some chronic constipation  Pt denies any headache, vision changes, dysphagia, current chest pain or pressure, palpitations, wheeze, cough, abdominal pain, hematuria, dysuria, diarrhea, focal extremity numbness weakness or pain  Review of systems are otherwise negative  Past Medical History  Diagnosis Date  . COPD (chronic obstructive pulmonary disease)   . Gout   . Coronary atherosclerosis of native coronary artery 02/05/2012  . Hypertension   . High blood cholesterol   . Heart murmur     "all my life" (02/05/2012)  . Anginal pain   . Myocardial infarction 1990's; 2000's    "2 total, I think" (02/05/2012)  . Pneumonia    "several times" (02/05/2012)  . Chronic bronchitis   . Shortness of breath     "sometimes when I'm laying down; always when I do too much" (02/05/2012)  . History of blood transfusion   . External bleeding hemorrhoids     "act up at times" (02/05/2012)  . Chronic back pain     "all over" (02/05/2012)  . Depression   . CAD (coronary artery disease) of artery bypass graft 02/09/14    atretic LIMA  . Multiple myeloma 09/07/2014   Past Surgical History  Procedure Laterality Date  . Appendectomy  ? 1970's  . Ectopic pregnancy surgery  1970's  . Abdominal hysterectomy  ? 1970's    "partial 1st time; complete 2nd" (02/05/2012)  . Cholecystectomy  ~ 2010  . Cataract extraction w/ intraocular lens  implant, bilateral  [phone]  . Coronary angioplasty with stent placement  2005  . Coronary artery bypass graft  2005    CABG X2  . Cardiac catheterization  02/09/14    atretic LIMA,  patent VG-dRCA and Total occlusion of the native RCA proximally prior to remotely placement RCA stent, with mild proximal 30% LAD stenosis and 30% distal circumflex stenoses.  EF 55%.  . Left heart catheterization with coronary/graft angiogram N/A 02/09/2014    Procedure: LEFT HEART CATHETERIZATION WITH Beatrix Fetters;  Surgeon: Troy Sine, MD;  Location: [facility] CATH LAB;  Service: Cardiovascular;  Laterality: N/A;  . Esophagogastroduodenoscopy (egd) with propofol Left 05/22/2014    Procedure: ESOPHAGOGASTRODUODENOSCOPY (EGD) WITH PROPOFOL;  Surgeon: Arta Silence, MD;  Location: [facility] ENDOSCOPY;  Service: Endoscopy;  Laterality: Left;   Social  History:  reports that she quit smoking about 3 years ago. Her smoking use included Cigarettes. She has a 50 pack-year smoking history. She has never used smokeless tobacco. She reports that she drinks alcohol. She reports that she does not use illicit drugs. Patient lives at home with her daughter & is able to participate in activities of daily living with use of a  walker. Incidentally, she states that she cannot return back home because there is mold in the house.  Allergies  Allergen Reactions  . Lexapro [Escitalopram Oxalate] Other (See Comments)    Fatigue   . Soma [Carisoprodol] Other (See Comments)    Fatigue   . Wellbutrin [Bupropion] Other (See Comments)    "couldn't function and take it"  . Albuterol Sulfate Other (See Comments)    Albuterol HFA inhaler caused nervousness   . Codeine Hives  . Levaquin [Levofloxacin In D5w] Other (See Comments)    Unknown allergic reaction  . Plavix [Clopidogrel Bisulfate] Other (See Comments)    Unknown reaction  . Statins     Family History  Problem Relation Age of Onset  . Anemia Neg Hx   . Arrhythmia Neg Hx   . Asthma Neg Hx   . Clotting disorder Neg Hx   . Fainting Neg Hx   . Heart attack Neg Hx   . Heart disease Neg Hx   . Heart failure Neg Hx   . Hyperlipidemia Neg Hx   . Hypertension Neg Hx   . Aneurysm    . CVA    . CVA Mother   . Aneurysm Father       Prior to Admission medications   Medication Sig Start Date End Date Taking? Authorizing Provider  acetaminophen (TYLENOL) 325 MG tablet Take 2 tablets (650 mg total) by mouth every 6 (six) hours as needed for mild pain (or Fever >/= 101). 09/13/14  Yes Reyne Dumas, MD  albuterol-ipratropium (COMBIVENT) 18-103 MCG/ACT inhaler Inhale 1-2 puffs into the lungs every 4 (four) hours as needed for wheezing or shortness of breath.   Yes Historical Provider, MD  aspirin 325 MG EC tablet Take 325 mg by mouth every morning.    Yes Historical Provider, MD  cholecalciferol (VITAMIN D) 1000 UNITS tablet Take 1,000 Units by mouth every morning.    Yes Historical Provider, MD  dexamethasone (DECADRON) 4 MG tablet Take 5 tablets every week before chemotherapy injection. 09/07/14  Yes Wyatt Portela, MD  ezetimibe (ZETIA) 10 MG tablet Take 1 tablet (10 mg total) by mouth at bedtime. 05/25/14  Yes Hosie Poisson, MD  feeding supplement, ENSURE ENLIVE,  (ENSURE ENLIVE) LIQD Take 237 mLs by mouth 3 (three) times daily between meals. 09/13/14  Yes Reyne Dumas, MD  megestrol (MEGACE) 400 MG/10ML suspension Take 10 mLs (400 mg total) by mouth 2 (two) times daily. 09/07/14  Yes Wyatt Portela, MD  Omega-3 Fatty Acids (FISH OIL) 1200 MG CAPS Take 1,200-2,400 mg by mouth 2 (two) times daily. Take 2 capsules (2400 mg) every morning and 1 capsule (1200 mg) every night   Yes Historical Provider, MD  pantoprazole (PROTONIX) 40 MG tablet Take 1 tablet (40 mg total) by mouth 2 (two) times daily. 05/25/14  Yes Hosie Poisson, MD  polyethylene glycol powder (GLYCOLAX/MIRALAX) powder Take 17 g by mouth every morning. Mix with 8 oz liquid and drink 05/16/14  Yes Historical Provider, MD  prochlorperazine (COMPAZINE) 10 MG tablet Take 10 mg by mouth 3 (three) times daily. As needed for nausea/vomiting  06/29/14  Yes Historical Provider, MD  RA SENNA 8.6 MG tablet Take 2 tablets by mouth at bedtime.  02/07/14  Yes Historical Provider, MD  tiotropium (SPIRIVA) 18 MCG inhalation capsule Place 18 mcg into inhaler and inhale daily as needed (shortness of breath).    Yes Historical Provider, MD  traMADol (ULTRAM) 50 MG tablet Take 50 mg by mouth every 6 (six) hours as needed for moderate pain.   Yes Historical Provider, MD  zolpidem (AMBIEN) 5 MG tablet Take 5 mg by mouth at bedtime as needed for sleep.   Yes Historical Provider, MD  nitroGLYCERIN (NITROSTAT) 0.4 MG SL tablet Place 0.4 mg under the tongue every 5 (five) minutes as needed for chest pain.    Historical Provider, MD    Physical Exam: BP 151/84 mmHg  Pulse 90  Temp(Src) 98 F (36.7 C) (Oral)  Resp 18  Ht 5' (1.524 m)  Wt 56.9 kg (125 lb 7.1 oz)  BMI 24.50 kg/m2  SpO2 100%  General:  Alert and oriented 3, no acute distress Eyes: Sclera nonicteric, extraocular movements are intact ENT: Normocephalic and atraumatic, mucous members are moist Neck: No carotid bruits Cardiovascular: Irregular rhythm, rate  controlled Respiratory: Clear to auscultation bilaterally Abddomen: Soft, nontender, nondistended, positive bowel sounds Skin: no skin breaks, tears or lesions Musculoskeletal: no clubbing or cyanosis or edema Psychiatric: patient is appropriate, no evidence of psychoses Neurologic: no focal deficits           Labs on Admission:  Basic Metabolic Panel:  Recent Labs Lab 10/06/14 1429 10/09/14 1524  NA 137 136  K 4.5 3.4*  CL  --  106  CO2 21* 22  GLUCOSE 180* 121*  BUN 24.6 28*  CREATININE 1.1 1.01*  CALCIUM 9.2 8.5*   Liver Function Tests:  Recent Labs Lab 10/06/14 1429 10/09/14 1524  AST 36* 25  ALT 29 21  ALKPHOS 69 48  BILITOT 0.80 0.5  PROT 8.7* 7.4  ALBUMIN 3.3* 3.1*    Recent Labs Lab 10/09/14 1524  LIPASE 25   No results for input(s): AMMONIA in the last 168 hours. CBC:  Recent Labs Lab 10/06/14 1429 10/09/14 1524  WBC 4.5 2.5*  NEUTROABS 3.3  --   HGB 9.1* 9.0*  HCT 27.0* 27.0*  MCV 98.2 98.5  PLT 97* 86*   Cardiac Enzymes: No results for input(s): CKTOTAL, CKMB, CKMBINDEX, TROPONINI in the last 168 hours.  BNP (last 3 results)  Recent Labs  05/23/14 1445 08/13/14 1216 10/09/14 1524  BNP 537.1* 150.0* 537.4*    ProBNP (last 3 results)  Recent Labs  02/08/14 1727  PROBNP 121.3    CBG: No results for input(s): GLUCAP in the last 168 hours.  Radiological Exams on Admission: Dg Chest 2 View  10/09/2014   CLINICAL DATA:  Acute onset of generalized weakness and chest pain. Initial encounter.  EXAM: CHEST  2 VIEW  COMPARISON:  Chest radiograph performed earlier today at 3:39 p.m.  FINDINGS: The lungs are well-aerated. Mild vascular congestion is noted. Mild bibasilar atelectasis is seen. No pleural effusion or pneumothorax is identified.  The heart is borderline enlarged. The patient is status post median sternotomy. No acute osseous abnormalities are seen.  IMPRESSION: Mild vascular congestion and borderline cardiomegaly. Mild  bibasilar atelectasis seen.   Electronically Signed   By: Garald Balding M.D.   On: 10/09/2014 17:26   Dg Chest Port 1 View  10/09/2014   CLINICAL DATA:  Weakness and chest pain today. Prior myocardial  infarction and multiple myeloma.  EXAM: PORTABLE CHEST - 1 VIEW  COMPARISON:  08/15/14  FINDINGS: Chin overlies the apices. Patient rotated right. Mild cardiomegaly. Prior median sternotomy. Right hemidiaphragm elevation. No right-sided pleural effusion. No pneumothorax. Low lung volumes with resultant pulmonary interstitial prominence. Partial obscuration of the lateral left hemidiaphragm, new since the prior exam but similar in appearance to 08/13/2014. Right infrahilar volume loss and atelectasis.  IMPRESSION: Low lung volumes. Partial left hemidiaphragm obscuration laterally. Favored to be technique related or possibly due to atelectasis. Pneumonia cannot be excluded. Consider further evaluation with PA and lateral radiographs.   Electronically Signed   By: Abigail Miyamoto M.D.   On: 10/09/2014 15:49    EKG: Independently reviewed. Atrial fibrillation, rate controlled  Assessment/Plan Present on Admission:  . Chest pain: Mild troponin elevation in patient with fair story. Heart score 5. Patient had a cardiac catheterization done 8 months prior with no significant blockage noted. Seen by cardiology. Keep on telemetry, serial troponins. As per cardiology will make nothing by mouth after midnight for potential noninvasive stress test versus cath if needed.  . SSS (sick sinus syndrome) . Protein-calorie malnutrition, severe: Nutrition to assess  . Multiple myeloma: Being followed by oncology  . Hypertension: Continue home medications  . COPD (chronic obstructive pulmonary disease): Stable  . Pancytopenia due to antineoplastic chemotherapy: Currently stable.  . A-fib: Rate controlled. Patient with history of near pancytopenia and although her chads 2 vascular score equals 7, it would be recommended she be  on anticoagulation. Would be discussed with her oncologist and cardiologist before initiating.  Elevated BNP level consistent with atrial fibrillation. Patient had echocardiogram 1 month prior noting no systolic or diastolic dysfunction  Constipation: As per patient's request, fleets enema  Shortness of breath at rest: Persisting. We'll check d-dimer. If positive, check CT and angiogram of chest  Consultants: Cardiology-branch  Code Status: DO NOT RESUSCITATE  Family Communication: left msg with daughter   Disposition Plan: Potential discharge tomorrow if enzymes negative and cleared by cardiology  Time spent: 40 minutes  [place] [facility] Pager [phone]

## 2014-10-09 NOTE — ED Notes (Signed)
Bed: WA17 Expected date:  Expected time:  Means of arrival:  Comments: Ems 34F weakness

## 2014-10-09 NOTE — Progress Notes (Signed)
Full consult note to follow. Patient with history of CAD, multiple myeloma currently on chemo admitted with generalized fatigue and mixed chest pain. EKG without specific ischemic changes, mild trop to 0.11. Have asked that patient be admitted to medicine to help evaluate her generalized fatigue in setting of multiple myeloma with recent velcade 10/06/14. We will follow in setting of mild troponin elevation.   Zandra Abts MD

## 2014-10-10 ENCOUNTER — Telehealth: Payer: Self-pay | Admitting: *Deleted

## 2014-10-10 ENCOUNTER — Observation Stay (HOSPITAL_COMMUNITY): Payer: Medicare Other

## 2014-10-10 DIAGNOSIS — I10 Essential (primary) hypertension: Secondary | ICD-10-CM | POA: Diagnosis not present

## 2014-10-10 DIAGNOSIS — R7989 Other specified abnormal findings of blood chemistry: Secondary | ICD-10-CM | POA: Insufficient documentation

## 2014-10-10 DIAGNOSIS — I482 Chronic atrial fibrillation: Secondary | ICD-10-CM | POA: Diagnosis not present

## 2014-10-10 DIAGNOSIS — I481 Persistent atrial fibrillation: Secondary | ICD-10-CM | POA: Diagnosis not present

## 2014-10-10 DIAGNOSIS — J42 Unspecified chronic bronchitis: Secondary | ICD-10-CM | POA: Diagnosis not present

## 2014-10-10 DIAGNOSIS — R079 Chest pain, unspecified: Secondary | ICD-10-CM | POA: Diagnosis not present

## 2014-10-10 DIAGNOSIS — I480 Paroxysmal atrial fibrillation: Secondary | ICD-10-CM | POA: Diagnosis not present

## 2014-10-10 DIAGNOSIS — R791 Abnormal coagulation profile: Secondary | ICD-10-CM | POA: Diagnosis not present

## 2014-10-10 DIAGNOSIS — C9000 Multiple myeloma not having achieved remission: Secondary | ICD-10-CM | POA: Diagnosis not present

## 2014-10-10 DIAGNOSIS — I4891 Unspecified atrial fibrillation: Secondary | ICD-10-CM | POA: Diagnosis not present

## 2014-10-10 DIAGNOSIS — I495 Sick sinus syndrome: Secondary | ICD-10-CM

## 2014-10-10 LAB — BASIC METABOLIC PANEL
ANION GAP: 8 (ref 5–15)
BUN: 33 mg/dL — ABNORMAL HIGH (ref 6–20)
CALCIUM: 8.1 mg/dL — AB (ref 8.9–10.3)
CO2: 21 mmol/L — AB (ref 22–32)
Chloride: 109 mmol/L (ref 101–111)
Creatinine, Ser: 1.09 mg/dL — ABNORMAL HIGH (ref 0.44–1.00)
GFR calc Af Amer: 54 mL/min — ABNORMAL LOW (ref >60)
GFR calc non Af Amer: 46 mL/min — ABNORMAL LOW (ref >60)
GLUCOSE: 115 mg/dL — AB (ref 65–99)
POTASSIUM: 3.3 mmol/L — AB (ref 3.5–5.1)
SODIUM: 138 mmol/L (ref 135–145)

## 2014-10-10 LAB — TROPONIN I: Troponin I: 0.09 ng/mL — ABNORMAL HIGH (ref <0.031)

## 2014-10-10 LAB — MAGNESIUM: Magnesium: 1.6 mg/dL — ABNORMAL LOW (ref 1.7–2.4)

## 2014-10-10 MED ORDER — TECHNETIUM TC 99M DIETHYLENETRIAME-PENTAACETIC ACID: 43.0000 | Freq: Once | INTRAVENOUS | Status: AC | PRN

## 2014-10-10 MED ORDER — POTASSIUM CHLORIDE CRYS ER 20 MEQ PO TBCR
30.0000 meq | EXTENDED_RELEASE_TABLET | Freq: Once | ORAL | Status: AC
  Administered 2014-10-10: 30 meq via ORAL
  Filled 2014-10-10 (×2): qty 1

## 2014-10-10 MED ORDER — MAGNESIUM OXIDE 400 (241.3 MG) MG PO TABS
800.0000 mg | ORAL_TABLET | ORAL | Status: AC
  Administered 2014-10-10: 800 mg via ORAL
  Filled 2014-10-10: qty 2

## 2014-10-10 MED ORDER — BISACODYL 10 MG RE SUPP
10.0000 mg | Freq: Once | RECTAL | Status: AC
  Administered 2014-10-10: 10 mg via RECTAL
  Filled 2014-10-10: qty 1

## 2014-10-10 MED ORDER — METOPROLOL SUCCINATE ER 25 MG PO TB24
12.5000 mg | ORAL_TABLET | Freq: Every day | ORAL | Status: DC
  Administered 2014-10-10 – 2014-10-11 (×2): 12.5 mg via ORAL
  Filled 2014-10-10 (×2): qty 1

## 2014-10-10 MED ORDER — RIVAROXABAN 15 MG PO TABS
15.0000 mg | ORAL_TABLET | Freq: Every day | ORAL | Status: DC
  Administered 2014-10-10 – 2014-10-13 (×4): 15 mg via ORAL
  Filled 2014-10-10 (×4): qty 1

## 2014-10-10 MED ORDER — ISOSORBIDE MONONITRATE ER 30 MG PO TB24
30.0000 mg | ORAL_TABLET | Freq: Every day | ORAL | Status: DC
  Administered 2014-10-10 – 2014-10-13 (×4): 30 mg via ORAL
  Filled 2014-10-10 (×4): qty 1

## 2014-10-10 MED ORDER — TECHNETIUM TO 99M ALBUMIN AGGREGATED
6.3800 | Freq: Once | INTRAVENOUS | Status: AC | PRN
  Administered 2014-10-10: 6 via INTRAVENOUS

## 2014-10-10 NOTE — Clinical Social Work Note (Signed)
Clinical Social Work Assessment  Patient Details  Name: [name] MRN: [id number] Date of Birth: 1932/03/28  Date of referral:  10/10/14               Reason for consult:  Facility Placement                Permission sought to share information with:  Chartered certified accountant granted to share information::  Yes, Verbal Permission Granted  Name::        Agency::     Relationship::     Contact Information:     Housing/Transportation Living arrangements for the past 2 months:  [place], [place] of Information:  Patient, Adult Children Patient Interpreter Needed:  None Criminal Activity/Legal Involvement Pertinent to Current Situation/Hospitalization:  No - Comment as needed Significant Relationships:  Adult Children Lives with:  Friends, Adult Children Do you feel safe going back to the place where you live?  No Need for family participation in patient care:  Yes (Comment)  Care giving concerns:  CSW received call from patient's daughter, [name] that patient has concerns about discharge.    Social Worker assessment / plan:  CSW spoke with patient & daughter, [name] at bedside re: discharge concerns. Patient had been living with her daughter, [name] but she has a mold problem so she has been staying with her friend. Patient had been to [facility] 6/8 - 6/23 & used [employer] days there.    Employment status:  Retired Nurse, adult PT Recommendations:  Not assessed at this time [place] / Referral to community resources:  [place]  Patient/Family's Response to care:  Patient & daughter are requesting SNF for long term care, but would prefer not to return to [facility] - asking for other facilities - possibly [facility]. CSW explained that she would likely have to apply for Medicaid in order to stay long term.   Patient/Family's Understanding of and  Emotional Response to Diagnosis, Current Treatment, and Prognosis:  Patient's daughter explained that she keeps coming back to the hospital for what appears to be the same thing every time, and thinks that if she goes to a SNF and stays long term that she would stop being readmitted to the hospital.   Emotional Assessment Appearance:  Appears stated age Attitude/Demeanor/Rapport:    Affect (typically observed):  Pleasant, Accepting Orientation:  Oriented to Self, Oriented to Place, Oriented to  Time, Oriented to Situation Alcohol / Substance use:    Psych involvement (Current and /or in the community):     Discharge Needs  Concerns to be addressed:    Readmission within the last 30 days:    Current discharge risk:    Barriers to Discharge:      Standley Brooking, LCSW 10/10/2014, 3:41 PM

## 2014-10-10 NOTE — Telephone Encounter (Signed)
Per staff message and POF I have scheduled appts. Advised scheduler of appts. JMW  

## 2014-10-10 NOTE — Progress Notes (Signed)
PROGRESS NOTE  [name] BJS:[id number] DOB: 06-20-1931 DOA: 10/09/2014 PCP: [name], MD  HPI/Recap of past 26 hours: 81 year old female past history of COPD, atrial fibrillation and multiple myeloma admitted on 7/4 for complaints of left-sided chest pressure as well as shortness of breath. Enzymes minimally elevated on admission, but have not changed overnight. Seen by cardiology who recommended adding low-dose beta blocker and long-acting nitrate patient's medical regimen.  Patient had an elevated d-dimer and persistent shortness of breath despite chest pain resolution after admission. D-dimer checked and found be elevated, so patient underwent VQ scan. Unable to do CT angiogram due to elevated BUN. VQ scan was low probability for pulmonary embolus. Patient's shortness of breath have resolved by today. Patient's chads 2 vas score was 7 and cardiology recommended long-term anticoagulation. This was discussed with her oncologist, Dr. Alen Blew.  Given her multiple myeloma and concern for mild pancytopenia. Nevertheless, oncology felt that there is no current medications and it was preferred should be on anticoagulation. Xarelto started.  Patient currently doing okay. Feels very weak. Denies any chest pain or shortness of breath.  Assessment/Plan: Principal Problem:   Chest pain: Chest pain resolved. At this time felt to be non-cardiac in nature. Patient has have risk factors but she does not want anything invasive done. Cardiology made recommendations to add nitrates and low-dose beta blocker Active Problems:   COPD (chronic obstructive pulmonary disease): Stable   Hypertension: As above    SSS (sick sinus syndrome)   Pancytopenia due to antineoplastic chemotherapy treating multiple myeloma: Overall stable. Being followed by oncology.   A-fib/history of sick sinus syndrome: Beta blocker added.   Elevated troponin: Secondary to atrial fibrillation. No signs of acute coronary  syndrome.   Code Status: DO NOT RESUSCITATE  Family Communication: Left message with family  Disposition Plan:  patient has requested skilled nursing facility. PT and OT consults pending Possible discharge to skilled nursing tomorrow   Consultants:  Cardiology  Case discussed with patient's oncologist  Procedures:  V/Q scan: Low probability of pulmonary embolus  Antibiotics:  None   Objective: BP 120/59 mmHg  Pulse 110  Temp(Src) 97.4 F (36.3 C) (Oral)  Resp 20  Ht 5' (1.524 m)  Wt 56.9 kg (125 lb 7.1 oz)  BMI 24.50 kg/m2  SpO2 100%  Intake/Output Summary (Last 24 hours) at 10/10/14 1537 Last data filed at 10/09/14 2209  Gross per 24 hour  Intake    290 ml  Output      0 ml  Net    290 ml   Filed Weights   10/09/14 1832  Weight: 56.9 kg (125 lb 7.1 oz)    Exam:   General: Alert and oriented 3, no acute distress  Cardiovascular: Irregular rhythm, rate controlled  Respiratory: Clear to auscultation bilaterally  Abdomen: Soft, nontender, nondistended, positive bowel sounds  Musculoskeletal: No clubbing or cyanosis, trace edema   Data Reviewed: Basic Metabolic Panel:  Recent Labs Lab 10/06/14 1429 10/09/14 1524 10/10/14 0020 10/10/14 1255  NA 137 136  --  138  K 4.5 3.4*  --  3.3*  CL  --  106  --  109  CO2 21* 22  --  21*  GLUCOSE 180* 121*  --  115*  BUN 24.6 28*  --  33*  CREATININE 1.1 1.01*  --  1.09*  CALCIUM 9.2 8.5*  --  8.1*  MG  --   --  1.6*  --    Liver Function Tests:  Recent  Labs Lab 10/06/14 1429 10/09/14 1524  AST 36* 25  ALT 29 21  ALKPHOS 69 48  BILITOT 0.80 0.5  PROT 8.7* 7.4  ALBUMIN 3.3* 3.1*    Recent Labs Lab 10/09/14 1524  LIPASE 25   No results for input(s): AMMONIA in the last 168 hours. CBC:  Recent Labs Lab 10/06/14 1429 10/09/14 1524  WBC 4.5 2.5*  NEUTROABS 3.3  --   HGB 9.1* 9.0*  HCT 27.0* 27.0*  MCV 98.2 98.5  PLT 97* 86*   Cardiac Enzymes:    Recent Labs Lab  10/09/14 1920 10/09/14 2155 10/10/14 0020  TROPONINI 0.11* 0.10* 0.09*   BNP (last 3 results)  Recent Labs  05/23/14 1445 08/13/14 1216 10/09/14 1524  BNP 537.1* 150.0* 537.4*    ProBNP (last 3 results)  Recent Labs  02/08/14 1727  PROBNP 121.3    CBG: No results for input(s): GLUCAP in the last 168 hours.  No results found for this or any previous visit (from the past 240 hour(s)).   Studies: Dg Chest 2 View  10/09/2014   CLINICAL DATA:  Acute onset of generalized weakness and chest pain. Initial encounter.  EXAM: CHEST  2 VIEW  COMPARISON:  Chest radiograph performed earlier today at 3:39 p.m.  FINDINGS: The lungs are well-aerated. Mild vascular congestion is noted. Mild bibasilar atelectasis is seen. No pleural effusion or pneumothorax is identified.  The heart is borderline enlarged. The patient is status post median sternotomy. No acute osseous abnormalities are seen.  IMPRESSION: Mild vascular congestion and borderline cardiomegaly. Mild bibasilar atelectasis seen.   Electronically Signed   By: Garald Balding M.D.   On: 10/09/2014 17:26   Nm Pulmonary Perf And Vent  10/10/2014   CLINICAL DATA:  Chest pain  EXAM: NUCLEAR MEDICINE VENTILATION - PERFUSION LUNG SCAN  TECHNIQUE: Ventilation images were obtained in multiple projections using inhaled aerosol Tc-37m DTPA. Perfusion images were obtained in multiple projections after intravenous injection of Tc-48m MAA.  RADIOPHARMACEUTICALS:  43.0 mCi Technetium-51m DTPA aerosol inhalation and 6.38 mCi chest x-ray from a skeletal survey dated July 20, 2014 Technetium-72m MAA IV  COMPARISON:  Chest x-ray from a skeletal survey dated July 20, 2014  FINDINGS: Ventilation: No focal ventilation defect.  Perfusion: No wedge shaped peripheral perfusion defects to suggest acute pulmonary embolism.  IMPRESSION: Findings consistent with a low probability for acute pulmonary embolism.   Electronically Signed   By: David  Martinique M.D.   On:  10/10/2014 11:54   Dg Chest Port 1 View  10/09/2014   CLINICAL DATA:  Weakness and chest pain today. Prior myocardial infarction and multiple myeloma.  EXAM: PORTABLE CHEST - 1 VIEW  COMPARISON:  08/15/14  FINDINGS: Chin overlies the apices. Patient rotated right. Mild cardiomegaly. Prior median sternotomy. Right hemidiaphragm elevation. No right-sided pleural effusion. No pneumothorax. Low lung volumes with resultant pulmonary interstitial prominence. Partial obscuration of the lateral left hemidiaphragm, new since the prior exam but similar in appearance to 08/13/2014. Right infrahilar volume loss and atelectasis.  IMPRESSION: Low lung volumes. Partial left hemidiaphragm obscuration laterally. Favored to be technique related or possibly due to atelectasis. Pneumonia cannot be excluded. Consider further evaluation with PA and lateral radiographs.   Electronically Signed   By: Abigail Miyamoto M.D.   On: 10/09/2014 15:49    Scheduled Meds: . aspirin  324 mg Oral Once  . aspirin  325 mg Oral q morning - 10a  . enoxaparin (LOVENOX) injection  40 mg Subcutaneous Q24H  .  ezetimibe  10 mg Oral QHS  . feeding supplement (ENSURE ENLIVE)  237 mL Oral TID BM  . isosorbide mononitrate  30 mg Oral Daily  . megestrol  400 mg Oral BID  . metoprolol succinate  12.5 mg Oral Daily  . pantoprazole  40 mg Oral BID  . polyethylene glycol  17 g Oral Daily  . prochlorperazine  10 mg Oral TID    Continuous Infusions:    Time spent: 25 min  [place] [facility] Pager [phone]. If 7PM-7AM, please contact night-coverage at [email or web address], password [facility] 10/10/2014, 3:37 PM

## 2014-10-10 NOTE — Clinical Social Work Placement (Signed)
   CLINICAL SOCIAL WORK PLACEMENT  NOTE  Date:  10/10/2014  Patient Details  Name: [name] MRN: [id number] Date of Birth: 04/07/1932  Clinical Social Work is seeking post-discharge placement for this patient at the [place] level of care (*CSW will initial, date and re-position this form in  chart as items are completed):  Yes   Patient/family provided with [facility] Work Department's list of facilities offering this level of care within the geographic area requested by the patient (or if unable, by the patient's family).  Yes   Patient/family informed of their freedom to choose among providers that offer the needed level of care, that participate in Medicare, Medicaid or managed care program needed by the patient, have an available bed and are willing to accept the patient.  Yes   Patient/family informed of [facility]'s ownership interest in [facility] and [facility], as well as of the fact that they are under no obligation to receive care at these facilities.  PASRR submitted to EDS on 10/10/14     PASRR number received on 10/10/14     Existing PASRR number confirmed on       FL2 transmitted to all facilities in geographic area requested by pt/family on 10/10/14     FL2 transmitted to all facilities within larger geographic area on       Patient informed that his/her managed care company has contracts with or will negotiate with certain facilities, including the following:            Patient/family informed of bed offers received.  Patient chooses bed at       Physician recommends and patient chooses bed at      Patient to be transferred to   on  .  Patient to be transferred to facility by       Patient family notified on   of transfer.  Name of family member notified:        PHYSICIAN       Additional Comment:    _______________________________________________ Standley Brooking, LCSW 10/10/2014, 3:50 PM

## 2014-10-10 NOTE — Progress Notes (Signed)
Per radiology, they can't be able to do CTA chest just yet due to high BUN level. Lamar Blinks NP was made aware.

## 2014-10-10 NOTE — Progress Notes (Signed)
Initial Nutrition Assessment  DOCUMENTATION CODES:  Not applicable  INTERVENTION: - Continue Ensure Enlive BID, each supplement provides 350 kcal and 20 grams of protein - RD will continue to monitor for needs  NUTRITION DIAGNOSIS:  Inadequate oral intake related to chronic illness as evidenced by per patient/family report.  GOAL:  Patient will meet greater than or equal to 90% of their needs  MONITOR:  PO intake, Supplement acceptance, Weight trends, Labs, I & O's  REASON FOR ASSESSMENT:  Malnutrition Screening Tool  ASSESSMENT: 81 y.o. female with past mental history of COPD, CAD and multiple myeloma who was in her usual state of health when she started having mild chest pressure on 7/3 afternoon. Patient was at rest. Pressure was left sternal, nonradiating elsewhere, some associated shortness of breath but no nausea or vomiting or dizziness. No syncope. It resolved on his own after several hours. When patient woke up this morning she started having a similar episode. She became concerned so she came into the emergency room for further evaluation. Chest pain soon resolved after getting to the emergency room without intervention.   Pt seen for MST. BMI indicates normal weight status. Pt was NPO this AM and reports that since diet advancement she has ordered something for breakfast (pancakes and milk) and is waiting for meal to arrive. She states she is "starving" this AM and that the last time she ate/drank was last night.   She indicates that PTA she had a decreased appetite x6-7 months with no associated nausea or vomiting. She indicates that she was losing weight from her UBW of 170 lbs during that time. Per chart review, pt has lost 14 lbs (10% body weight) in the past 5 months which is significant for time frame. No muscle or fat wasting noted.   Pt unable to meet needs at this time. Medications reviewed. Labs reviewed; K: 3.4 mg/dL, BUN/creatinine elevated, Ca: 8.5  mg/dL.  Height:  Ht Readings from Last 1 Encounters:  10/09/14 5' (1.524 m)    Weight:  Wt Readings from Last 1 Encounters:  10/09/14 125 lb 7.1 oz (56.9 kg)    Ideal Body Weight:  45.45 kg (kg)  Wt Readings from Last 10 Encounters:  10/09/14 125 lb 7.1 oz (56.9 kg)  10/06/14 126 lb 14.4 oz (57.561 kg)  09/22/14 122 lb 3.2 oz (55.43 kg)  09/13/14 128 lb 15.5 oz (58.5 kg)  09/07/14 125 lb 1.6 oz (56.745 kg)  08/24/14 128 lb 11.2 oz (58.378 kg)  08/13/14 132 lb (59.875 kg)  08/09/14 128 lb 8 oz (58.287 kg)  07/20/14 132 lb 1.6 oz (59.92 kg)  06/02/14 139 lb (63.05 kg)    BMI:  Body mass index is 24.5 kg/(m^2).  Estimated Nutritional Needs:  Kcal:  1250-1450  Protein:  50-60 grams  Fluid:  2 L/day  Skin:  Reviewed, no issues  Diet Order:  Diet Heart Room service appropriate?: Yes; Fluid consistency:: Thin  EDUCATION NEEDS:  No education needs identified at this time   Intake/Output Summary (Last 24 hours) at 10/10/14 1156 Last data filed at 10/09/14 2209  Gross per 24 hour  Intake    290 ml  Output      0 ml  Net    290 ml    Last BM:  PTA     Jarome Matin, RD, LDN Inpatient Clinical Dietitian Pager # [phone] After hours/weekend pager # [phone]

## 2014-10-10 NOTE — Progress Notes (Signed)
Shift event note:  Notified by RN that CTA can not be done d/t pt's age w/ elevated creatinine. Given pt's thrombocytopenia, anemia and resolution of CP will hold on anticoagulation for now. Cardiology service has recommended discussion w/ pt's cardiologist and oncologist prior to starting anticoagulation. Pt has remained CP free w/o SOB or hypoxia. Will obtain V/Q scan in am. Will continue to monitor closely on telemetry.   Jeryl Columbia, NP-C [facility] Pager [phone]

## 2014-10-10 NOTE — Progress Notes (Signed)
ANTICOAGULATION CONSULT NOTE - Initial Consult  Pharmacy Consult for Xarelto Indication: Atrial Fibrillation  Allergies  Allergen Reactions  . Lexapro [Escitalopram Oxalate] Other (See Comments)    Fatigue   . Soma [Carisoprodol] Other (See Comments)    Fatigue   . Wellbutrin [Bupropion] Other (See Comments)    "couldn't function and take it"  . Albuterol Sulfate Other (See Comments)    Albuterol HFA inhaler caused nervousness   . Codeine Hives  . Levaquin [Levofloxacin In D5w] Other (See Comments)    Unknown allergic reaction  . Plavix [Clopidogrel Bisulfate] Other (See Comments)    Unknown reaction  . Statins     Patient Measurements: Height: 5' (152.4 cm) Weight: 125 lb 7.1 oz (56.9 kg) IBW/kg (Calculated) : 45.5 Heparin Dosing Weight:   Vital Signs: Temp: 97.4 F (36.3 C) (07/05 1357) Temp Source: Oral (07/05 1357) BP: 120/59 mmHg (07/05 1357) Pulse Rate: 110 (07/05 1357)  Labs:  Recent Labs  10/09/14 1524 10/09/14 1920 10/09/14 2155 10/10/14 0020 10/10/14 1255  HGB 9.0*  --   --   --   --   HCT 27.0*  --   --   --   --   PLT 86*  --   --   --   --   CREATININE 1.01*  --   --   --  1.09*  TROPONINI  --  0.11* 0.10* 0.09*  --     Estimated Creatinine Clearance: 32 mL/min (by C-G formula based on Cr of 1.09).   Medical History: Past Medical History  Diagnosis Date  . COPD (chronic obstructive pulmonary disease)   . Gout   . Coronary atherosclerosis of native coronary artery 02/05/2012  . Hypertension   . High blood cholesterol   . Heart murmur     "all my life" (02/05/2012)  . Anginal pain   . Myocardial infarction 1990's; 2000's    "2 total, I think" (02/05/2012)  . Pneumonia     "several times" (02/05/2012)  . Chronic bronchitis   . Shortness of breath     "sometimes when I'm laying down; always when I do too much" (02/05/2012)  . History of blood transfusion   . External bleeding hemorrhoids     "act up at times" (02/05/2012)  . Chronic  back pain     "all over" (02/05/2012)  . Depression   . CAD (coronary artery disease) of artery bypass graft 02/09/14    atretic [place]  . Multiple myeloma 09/07/2014    Assessment: 46 yoF to start Xarelto for new onset atrial fibrillation with RVR. Pt has PMHx pertinent for CKD-III, CAD s/p CABG 2005, sinus bradycardia (therefore not on BB), MM on velcade with anemia and thrombocytopenia, and recent TIA admitted 7/4 with fatigue and chest pain.  Previously not felt to be candidate for anticoagulation given age, falls, and anemia despite CHADSVASC 6-7. Cardiology now in favor of anticoagulation if cleared by oncology.  Pharmacy consulted to start Xarelto today.    7/5: CKD-III: SCr 1.09 (at baseline), CrCl 32 ml/min.  CBC shows hgb 9, plt 86 (on weekly velcade).  --Noted also on ASA 325mg , lovenox 40mg  (last dose 7/4 @ 2030)   Goal of Therapy:  aPTT 66-102 seconds Monitor platelets by anticoagulation protocol: Yes   Plan:  Stop lovenox Xarelto 15mg  PO daily with supper Xarelto education prior to discharge F/u renal fxn, CBC, clinical course  Ralene Bathe, PharmD, BCPS 10/10/2014, 3:41 PM  Pager: [phone]

## 2014-10-10 NOTE — Progress Notes (Signed)
Patient: [name] / Admit Date: 10/09/2014 / Date of Encounter: 10/10/2014, 8:38 AM   Subjective: Feeling much better this AM. No further chest pain. Reports feeling hungry and thirsty.    Objective: Telemetry: atrial fib with rates in the 90s mostly (occasional increases to the 110-120s but these are brief). She has frequent PVCs, occasionally occurring in couplets and triplets, occasional bigeminy Physical Exam: Blood pressure 105/71, pulse 96, temperature 97.6 F (36.4 C), temperature source Oral, resp. rate 20, height 5' (1.524 m), weight 125 lb 7.1 oz (56.9 kg), SpO2 99 %. General: Frail elderly WF in no acute distress. Head: Normocephalic, atraumatic, sclera non-icteric, no xanthomas, nares are without discharge. Neck: Negative for carotid bruits. JVP not elevated. Lungs: Clear bilaterally to auscultation without wheezes, rales, or rhonchi. Breathing is unlabored. Heart: Irregularly irregular S1 S2 without murmurs, rubs, or gallops.  Abdomen: Soft, non-tender, non-distended with normoactive bowel sounds. No rebound/guarding. Extremities: No clubbing or cyanosis. No edema. Distal pedal pulses are 2+ and equal bilaterally. Neuro: Alert and oriented X 3. Moves all extremities spontaneously. Psych:  Responds to questions appropriately with a normal affect.   Intake/Output Summary (Last 24 hours) at 10/10/14 0838 Last data filed at 10/09/14 2209  Gross per 24 hour  Intake    290 ml  Output      0 ml  Net    290 ml    Inpatient Medications:  . aspirin  324 mg Oral Once  . aspirin  325 mg Oral q morning - 10a  . enoxaparin (LOVENOX) injection  40 mg Subcutaneous Q24H  . ezetimibe  10 mg Oral QHS  . feeding supplement (ENSURE ENLIVE)  237 mL Oral TID BM  . megestrol  400 mg Oral BID  . pantoprazole  40 mg Oral BID  . polyethylene glycol  17 g Oral Daily  . potassium chloride  30 mEq Oral Once  . prochlorperazine  10 mg Oral TID   Infusions:    Labs:  Recent Labs  10/09/14 1524  NA 136  K 3.4*  CL 106  CO2 22  GLUCOSE 121*  BUN 28*  CREATININE 1.01*  CALCIUM 8.5*    Recent Labs  10/09/14 1524  AST 25  ALT 21  ALKPHOS 48  BILITOT 0.5  PROT 7.4  ALBUMIN 3.1*    Recent Labs  10/09/14 1524  WBC 2.5*  HGB 9.0*  HCT 27.0*  MCV 98.5  PLT 86*    Recent Labs  10/09/14 1920 10/09/14 2155 10/10/14 0020  TROPONINI 0.11* 0.10* 0.09*   Invalid input(s): POCBNP No results for input(s): HGBA1C in the last 72 hours.   Radiology/Studies:  Dg Chest 2 View  10/09/2014   CLINICAL DATA:  Acute onset of generalized weakness and chest pain. Initial encounter.  EXAM: CHEST  2 VIEW  COMPARISON:  Chest radiograph performed earlier today at 3:39 p.m.  FINDINGS: The lungs are well-aerated. Mild vascular congestion is noted. Mild bibasilar atelectasis is seen. No pleural effusion or pneumothorax is identified.  The heart is borderline enlarged. The patient is status post median sternotomy. No acute osseous abnormalities are seen.  IMPRESSION: Mild vascular congestion and borderline cardiomegaly. Mild bibasilar atelectasis seen.   Electronically Signed   By: Garald Balding M.D.   On: 10/09/2014 17:26   Mr Brain Wo Contrast  09/10/2014   CLINICAL DATA:  81 year old hypertensive female with sudden onset of left leg weakness. Subsequent encounter.  EXAM: MRI HEAD WITHOUT CONTRAST  MRA HEAD WITHOUT CONTRAST  TECHNIQUE: Multiplanar, multiecho pulse sequences of the brain and surrounding structures were obtained without intravenous contrast. Angiographic images of the head were obtained using MRA technique without contrast.  COMPARISON:  09/10/2014 head CT.  05/24/2014 brain MR.  FINDINGS: MRI HEAD FINDINGS  Exam is motion degraded.  No acute infarct.  No intracranial hemorrhage.  Mild small vessel disease type changes.  Global atrophy without hydrocephalus.  No intracranial mass lesion noted on this unenhanced exam.  Heterogeneous bone marrow unchanged. This may be  related to patient's underlying anemia.  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  Mild to slight moderate narrowing distal right vertebral artery.  Moderate narrowing mid basilar artery.  Nonvisualized posterior inferior cerebellar arteries.  Mild narrowing and irregularity of portions of the posterior cerebral arteries bilaterally.  Moderate narrowing of portions of the cavernous segment and supraclinoid segment of the internal carotid arteries greater on the right. Associated areas of ectasia without saccular aneurysm.  Mild narrowing and irregularity M1 segment left middle cerebral artery. Mild narrowing right middle cerebral artery bifurcation. Moderate narrowing middle cerebral artery branch vessels bilaterally.  Mild narrowing and irregularity A1 segment anterior cerebral artery bilaterally.  IMPRESSION: Exam is motion degraded.  No acute infarct.  Small vessel disease type changes.  Bone marrow changes suggestive of result of underlying anemia.  Prominent intracranial atherosclerotic type changes as detailed above.   Electronically Signed   By: Genia Del M.D.   On: 09/10/2014 15:31   Dg Chest Port 1 View  10/09/2014   CLINICAL DATA:  Weakness and chest pain today. Prior myocardial infarction and multiple myeloma.  EXAM: PORTABLE CHEST - 1 VIEW  COMPARISON:  08/15/14  FINDINGS: Chin overlies the apices. Patient rotated right. Mild cardiomegaly. Prior median sternotomy. Right hemidiaphragm elevation. No right-sided pleural effusion. No pneumothorax. Low lung volumes with resultant pulmonary interstitial prominence. Partial obscuration of the lateral left hemidiaphragm, new since the prior exam but similar in appearance to 08/13/2014. Right infrahilar volume loss and atelectasis.  IMPRESSION: Low lung volumes. Partial left hemidiaphragm obscuration laterally. Favored to be technique related or possibly due to atelectasis. Pneumonia cannot be excluded. Consider further  evaluation with PA and lateral radiographs.   Electronically Signed   By: Abigail Miyamoto M.D.   On: 10/09/2014 15:49   [name]  09/10/2014   CLINICAL DATA:  81 year old hypertensive female with sudden onset of left leg weakness. Subsequent encounter.  EXAM: MRI HEAD WITHOUT CONTRAST  MRA HEAD WITHOUT CONTRAST  TECHNIQUE: Multiplanar, multiecho pulse sequences of the brain and surrounding structures were obtained without intravenous contrast. Angiographic images of the head were obtained using MRA technique without contrast.  COMPARISON:  09/10/2014 head CT.  05/24/2014 brain MR.  FINDINGS: MRI HEAD FINDINGS  Exam is motion degraded.  No acute infarct.  No intracranial hemorrhage.  Mild small vessel disease type changes.  Global atrophy without hydrocephalus.  No intracranial mass lesion noted on this unenhanced exam.  Heterogeneous bone marrow unchanged. This may be related to patient's underlying anemia.  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  Mild to slight moderate narrowing distal right vertebral artery.  Moderate narrowing mid basilar artery.  Nonvisualized posterior inferior cerebellar arteries.  Mild narrowing and irregularity of portions of the posterior cerebral arteries bilaterally.  Moderate narrowing of portions of the cavernous segment and supraclinoid segment of the internal carotid arteries greater on the right. Associated areas of ectasia without saccular aneurysm.  Mild narrowing and irregularity M1 segment left middle cerebral artery. Mild narrowing right middle cerebral artery bifurcation. Moderate narrowing middle cerebral artery branch vessels bilaterally.  Mild narrowing and irregularity A1 segment anterior cerebral artery bilaterally.  IMPRESSION: Exam is motion degraded.  No acute infarct.  Small vessel disease type changes.  Bone marrow changes suggestive of result of underlying anemia.  Prominent intracranial atherosclerotic type changes as detailed  above.   Electronically Signed   By: Genia Del M.D.   On: 09/10/2014 15:31     Assessment and Plan  30F with CAD s/p CABG 2005, HTN, COPD, multiple myeloma on Velcade, anemia felt due to MM, thrombocytopenia felt due to Velcade per onc, sinus bradycardia (not on BB due to this), recent TIA admitted with generalized fatigue and chest pain. Noted to have chronic anemia, also with slight worsening of intermittent thrombocytopenia/leukopenia. H/o admission for chest pressure with EGD in 05/2014 demonstrating one small gastric body AVM and mild pyloric channel edema; required 1 UPRBC that admission. Most recent echo 09/11/14: EF 60-65%, mildly dilated LA/RA, mild TR - done for TIA last month.  1. Chest pain with mildly elevated troponin, h/o CAD s/p CABG - troponin trend is flat. She is on aspirin. No BB due to h/o bradycardia. Continue Zetia for now (can decide on statin as outpatient). VQ is pending since d-dimer is high at 2.88. Will discuss further ischemic testing with Dr. Tamala Julian who knows patient well. This is not an easy decision given patient's comorbidities. Per our tentative discussion will allow her to eat today and add Imdur $RemoveB'30mg'teZFSdgB$  daily.  2. Newly recognized atrial fibrillation - she was admitted last month with a TIA but no PAF demonstrated that admission. Further monitoring for this was not pursued as Dr. Johnsie Cancel felt she was not a candidate for anticoagulation given age, falls and anemia with myeloma. CHADSVASC 7. Will get primary cardiologist's final input regarding this - agree she is higher risk given her multiple myeloma encompassing anemia/thrombocytopenia, general deconditioning and h/o gastric AVM. Given history of sick sinus/bradycardia, would not start on AVN blocking agent. Rates are generally controlled in the 90s right now.  3. Multiple myeloma with anemia, leukopenia, thrombocytopenia - may be contributing to her general fatigue. Also of concern is continued weight loss since earlier  this year (147-151, down to 125 this admission). Further per IM.   4. CKD stage III - Cr appears generally stable. Will replete K from yesterday (3.4) since she is having PVCs. Check Mg.  Signed, Melina Copa PA-C Pager: [phone]

## 2014-10-11 DIAGNOSIS — J42 Unspecified chronic bronchitis: Secondary | ICD-10-CM | POA: Diagnosis not present

## 2014-10-11 DIAGNOSIS — R7989 Other specified abnormal findings of blood chemistry: Secondary | ICD-10-CM | POA: Diagnosis not present

## 2014-10-11 DIAGNOSIS — R079 Chest pain, unspecified: Secondary | ICD-10-CM | POA: Diagnosis not present

## 2014-10-11 DIAGNOSIS — I482 Chronic atrial fibrillation: Secondary | ICD-10-CM | POA: Diagnosis not present

## 2014-10-11 DIAGNOSIS — I4891 Unspecified atrial fibrillation: Secondary | ICD-10-CM | POA: Diagnosis not present

## 2014-10-11 DIAGNOSIS — I10 Essential (primary) hypertension: Secondary | ICD-10-CM | POA: Diagnosis not present

## 2014-10-11 LAB — BASIC METABOLIC PANEL
ANION GAP: 7 (ref 5–15)
BUN: 37 mg/dL — ABNORMAL HIGH (ref 6–20)
CO2: 23 mmol/L (ref 22–32)
Calcium: 8.4 mg/dL — ABNORMAL LOW (ref 8.9–10.3)
Chloride: 108 mmol/L (ref 101–111)
Creatinine, Ser: 1.21 mg/dL — ABNORMAL HIGH (ref 0.44–1.00)
GFR calc Af Amer: 47 mL/min — ABNORMAL LOW (ref >60)
GFR, EST NON AFRICAN AMERICAN: 41 mL/min — AB (ref >60)
Glucose, Bld: 117 mg/dL — ABNORMAL HIGH (ref 65–99)
POTASSIUM: 3.9 mmol/L (ref 3.5–5.1)
SODIUM: 138 mmol/L (ref 135–145)

## 2014-10-11 LAB — CBC
HEMATOCRIT: 25.3 % — AB (ref 36.0–46.0)
Hemoglobin: 8.4 g/dL — ABNORMAL LOW (ref 12.0–15.0)
MCH: 32.3 pg (ref 26.0–34.0)
MCHC: 33.2 g/dL (ref 30.0–36.0)
MCV: 97.3 fL (ref 78.0–100.0)
Platelets: 84 10*3/uL — ABNORMAL LOW (ref 150–400)
RBC: 2.6 MIL/uL — ABNORMAL LOW (ref 3.87–5.11)
RDW: 21.5 % — AB (ref 11.5–15.5)
WBC: 4 10*3/uL (ref 4.0–10.5)

## 2014-10-11 LAB — MAGNESIUM: MAGNESIUM: 1.6 mg/dL — AB (ref 1.7–2.4)

## 2014-10-11 MED ORDER — METOPROLOL SUCCINATE ER 25 MG PO TB24
12.5000 mg | ORAL_TABLET | Freq: Every day | ORAL | Status: AC
  Administered 2014-10-11: 12.5 mg via ORAL
  Filled 2014-10-11: qty 1

## 2014-10-11 MED ORDER — METOPROLOL SUCCINATE ER 25 MG PO TB24
25.0000 mg | ORAL_TABLET | Freq: Every day | ORAL | Status: DC
  Administered 2014-10-12 – 2014-10-13 (×2): 25 mg via ORAL
  Filled 2014-10-11 (×2): qty 1

## 2014-10-11 MED ORDER — MAGNESIUM SULFATE 2 GM/50ML IV SOLN
2.0000 g | Freq: Once | INTRAVENOUS | Status: AC
  Administered 2014-10-11: 2 g via INTRAVENOUS
  Filled 2014-10-11: qty 50

## 2014-10-11 MED ORDER — MAGNESIUM OXIDE 400 (241.3 MG) MG PO TABS
400.0000 mg | ORAL_TABLET | Freq: Two times a day (BID) | ORAL | Status: DC
  Administered 2014-10-12 – 2014-10-13 (×3): 400 mg via ORAL
  Filled 2014-10-11 (×3): qty 1

## 2014-10-11 MED ORDER — MAGNESIUM OXIDE 400 (241.3 MG) MG PO TABS: 400.0000 mg | ORAL_TABLET | Freq: Two times a day (BID) | ORAL | Status: DC

## 2014-10-11 NOTE — Progress Notes (Signed)
Patient: [name] / Admit Date: 10/09/2014 / Date of Encounter: 10/11/2014, 7:53 AM   Subjective: Feeling alright. Not great, but not bad. Still better than admission. No further chest pain. No SOB. No awareness of palpitations.   Objective: Telemetry: atrial fib rates in the 80s, interspersed with what appears to be periods of atrial flutter 2:1 conduction, occasional PVCs            Physical Exam: Blood pressure 129/63, pulse 80, temperature 98 F (36.7 C), temperature source Oral, resp. rate 20, height 5' (1.524 m), weight 125 lb 7.1 oz (56.9 kg), SpO2 97 %. General: Thin elderly WF in no acute distress. Head: Normocephalic, atraumatic, sclera non-icteric, no xanthomas, nares are without discharge. Neck: Negative for carotid bruits. JVP not elevated. Lungs: Clear bilaterally to auscultation without wheezes, rales, or rhonchi. Breathing is unlabored. Heart: Irregularly irregular S1 S2 without murmurs, rubs, or gallops.  Abdomen: Soft, non-tender, non-distended with normoactive bowel sounds. No rebound/guarding. Extremities: No clubbing or cyanosis. No edema. Distal pedal pulses are 2+ and equal bilaterally. Neuro: Alert and oriented X 3. Moves all extremities spontaneously. Psych: Responds to questions appropriately with a normal affect.   Intake/Output Summary (Last 24 hours) at 10/11/14 0753 Last data filed at 10/11/14 0734  Gross per 24 hour  Intake    360 ml  Output    200 ml  Net    160 ml    Inpatient Medications:  . aspirin  324 mg Oral Once  . aspirin  325 mg Oral q morning - 10a  . ezetimibe  10 mg Oral QHS  . feeding supplement (ENSURE ENLIVE)  237 mL Oral TID BM  . isosorbide mononitrate  30 mg Oral Daily  . megestrol  400 mg Oral BID  . metoprolol succinate  12.5 mg Oral Daily  . pantoprazole  40 mg Oral BID  . polyethylene glycol  17 g Oral Daily  . prochlorperazine  10 mg Oral TID  . rivaroxaban  15 mg Oral Q supper   Infusions:    Labs:  Recent  Labs  10/10/14 0020 10/10/14 1255 10/11/14 0409  NA  --  138 138  K  --  3.3* 3.9  CL  --  109 108  CO2  --  21* 23  GLUCOSE  --  115* 117*  BUN  --  33* 37*  CREATININE  --  1.09* 1.21*  CALCIUM  --  8.1* 8.4*  MG 1.6*  --  1.6*    Recent Labs  10/09/14 1524  AST 25  ALT 21  ALKPHOS 48  BILITOT 0.5  PROT 7.4  ALBUMIN 3.1*    Recent Labs  10/09/14 1524 10/11/14 0409  WBC 2.5* 4.0  HGB 9.0* 8.4*  HCT 27.0* 25.3*  MCV 98.5 97.3  PLT 86* 84*    Recent Labs  10/09/14 1920 10/09/14 2155 10/10/14 0020  TROPONINI 0.11* 0.10* 0.09*   Invalid input(s): POCBNP No results for input(s): HGBA1C in the last 72 hours.   Radiology/Studies:  Dg Chest 2 View  10/09/2014   CLINICAL DATA:  Acute onset of generalized weakness and chest pain. Initial encounter.  EXAM: CHEST  2 VIEW  COMPARISON:  Chest radiograph performed earlier today at 3:39 p.m.  FINDINGS: The lungs are well-aerated. Mild vascular congestion is noted. Mild bibasilar atelectasis is seen. No pleural effusion or pneumothorax is identified.  The heart is borderline enlarged. The patient is status post median sternotomy. No acute osseous abnormalities are seen.  IMPRESSION: Mild vascular congestion and borderline cardiomegaly. Mild bibasilar atelectasis seen.   Electronically Signed   By: Garald Balding M.D.   On: 10/09/2014 17:26   Nm Pulmonary Perf And Vent  10/10/2014   CLINICAL DATA:  Chest pain  EXAM: NUCLEAR MEDICINE VENTILATION - PERFUSION LUNG SCAN  TECHNIQUE: Ventilation images were obtained in multiple projections using inhaled aerosol Tc-74m DTPA. Perfusion images were obtained in multiple projections after intravenous injection of Tc-75m MAA.  RADIOPHARMACEUTICALS:  43.0 mCi Technetium-53m DTPA aerosol inhalation and 6.38 mCi chest x-ray from a skeletal survey dated July 20, 2014 Technetium-67m MAA IV  COMPARISON:  Chest x-ray from a skeletal survey dated July 20, 2014  FINDINGS: Ventilation: No focal  ventilation defect.  Perfusion: No wedge shaped peripheral perfusion defects to suggest acute pulmonary embolism.  IMPRESSION: Findings consistent with a low probability for acute pulmonary embolism.   Electronically Signed   By: David  Martinique M.D.   On: 10/10/2014 11:54   Dg Chest Port 1 View  10/09/2014   CLINICAL DATA:  Weakness and chest pain today. Prior myocardial infarction and multiple myeloma.  EXAM: PORTABLE CHEST - 1 VIEW  COMPARISON:  08/15/14  FINDINGS: Chin overlies the apices. Patient rotated right. Mild cardiomegaly. Prior median sternotomy. Right hemidiaphragm elevation. No right-sided pleural effusion. No pneumothorax. Low lung volumes with resultant pulmonary interstitial prominence. Partial obscuration of the lateral left hemidiaphragm, new since the prior exam but similar in appearance to 08/13/2014. Right infrahilar volume loss and atelectasis.  IMPRESSION: Low lung volumes. Partial left hemidiaphragm obscuration laterally. Favored to be technique related or possibly due to atelectasis. Pneumonia cannot be excluded. Consider further evaluation with PA and lateral radiographs.   Electronically Signed   By: Abigail Miyamoto M.D.   On: 10/09/2014 15:49     Assessment and Plan  69F with CAD s/p CABG 2005, HTN, COPD, multiple myeloma on Velcade, anemia felt due to MM, thrombocytopenia felt due to Velcade per onc, sinus bradycardia (not on BB due to this), recent TIA admitted with generalized fatigue and chest pain. Noted to have chronic anemia, also with slight worsening of intermittent thrombocytopenia/leukopenia. H/o admission for chest pressure with EGD in 05/2014 demonstrating one small gastric body AVM and mild pyloric channel edema; required 1 UPRBC that admission. Most recent echo 09/11/14: EF 60-65%, mildly dilated LA/RA, mild TR - done for TIA last month.  1. Chest pain with mildly elevated troponin, h/o CAD s/p CABG - troponin trend is flat. Last cath less than 1 year ago showed atretic  LIMA but patent LAD and SVG to RCA. Non obstructive disease was the main finding. VQ low prob for PE. Present plan is to see how she does with addition of antianginals (Imdur, metoprolol), reserving invasive eval for refractory sx.  2. Newly recognized atrial fibrillation with history of sick sinus syndrome - now with likely paroxysmal atrial flutter as well - CHADSVASC 7. Dr. Tamala Julian felt she would be a candidate to try on anticoagulation and Dr. Alen Blew cleared her for this per IM notes. She is now on Xarelto $RemoveBe'15mg'jZobTrYpH$  daily. Given her comorbidities and the initiation of Xarelto, will d/c aspirin. Low dose BB added - not being aggressively titrated due to h/o bradycardia requiring d/c of BB in the past. Will discuss with Dr. Tamala Julian whether a strategy of amiodarone would be useful given her paroxysms of both fib and flutter.   3. Multiple myeloma with anemia, leukopenia, thrombocytopenia - may be contributing to her general fatigue. Also of concern  is continued weight loss since earlier this year (147-151, down to 125 this admission). Further per IM/oncology.  4. CKD stage III - baseline Cr appears 1.1-1.2. Potassium was repleted. Mg still 1.6 after repletion yesterday. Give Mag sulfate 2g today and start MagOx 411m BID tomorrow. F/u BMET in AM. If K dips back down again she may need standing potassium repletion but prefer to f/u first before adding given chronic kidney disease.  Signed, DMelina CopaPA-C Pager: [phone]

## 2014-10-11 NOTE — Evaluation (Signed)
Physical Therapy Evaluation Patient Details Name: [name] MRN: [id number] DOB: November 17, 1931 Today's Date: 10/11/2014   History of Present Illness  81 year old female past history of COPD, atrial fibrillation and multiple myeloma admitted on 7/4 for complaints of left-sided chest pressure as well as shortness of breath. Enzymes minimally elevated on admission, but have not changed overnight. Seen by cardiology who recommended adding low-dose beta blocker and long-acting nitrate patient's medical regimen.  V/Q Low probability of PE  Clinical Impression  Pt admitted with above diagnosis. Pt currently with functional limitations due to the deficits listed below (see PT Problem List).  Pt will benefit from skilled PT to increase their independence and safety with mobility to allow discharge to the venue listed below.    Pt ambulation limited due to tachycardia with HR up to 150 bpm.  Recommend SNF at this time.     Follow Up Recommendations SNF    Equipment Recommendations  None recommended by PT    Recommendations for Other Services       Precautions / Restrictions Precautions Precautions: Fall Precaution Comments: monitor HR      Mobility  Bed Mobility Overal bed mobility: Needs Assistance Bed Mobility: Supine to Sit     Supine to sit: Supervision;HOB elevated     General bed mobility comments: increased time and effort  Transfers Overall transfer level: Needs assistance Equipment used: Rolling walker (2 wheeled) Transfers: Sit to/from Stand Sit to Stand: Min assist         General transfer comment: verbal cues for safe technique with RW, performed twice as pt initially with dizziness  Ambulation/Gait Ambulation/Gait assistance: Min assist Ambulation Distance (Feet): 20 Feet Assistive device: Rolling walker (2 wheeled) Gait Pattern/deviations: Step-through pattern;Trunk flexed     General Gait Details: assist to steady, feeling very weak, HR up to 150 so had  pt sit down and pt taken back to room in chair, HR again up to 150 with transfer to recliner  Stairs            Wheelchair Mobility    Modified Rankin (Stroke Patients Only)       Balance                                             Pertinent Vitals/Pain Pain Assessment: No/denies pain    Home Living Family/patient expects to be discharged to:: Private residence Living Arrangements: Children   Type of Home: Mobile home Home Access: Stairs to enter Entrance Stairs-Rails: Psychiatric nurse of Steps: 4 Home Layout: One level Home Equipment: Environmental consultant - 2 wheels Additional Comments: pt reports daughter works, and currently home with mold    Prior Function Level of Independence: Independent         Comments: states she typically does not use assistive device but has RW     Hand Dominance        Extremity/Trunk Assessment               Lower Extremity Assessment: Generalized weakness         Communication   Communication: No difficulties  Cognition Arousal/Alertness: Awake/alert Behavior During Therapy: WFL for tasks assessed/performed Overall Cognitive Status: Within Functional Limits for tasks assessed                      General Comments  Exercises        Assessment/Plan    PT Assessment Patient needs continued PT services  PT Diagnosis Difficulty walking   PT Problem List Decreased strength;Decreased activity tolerance;Decreased mobility;Decreased knowledge of use of DME;Cardiopulmonary status limiting activity  PT Treatment Interventions DME instruction;Gait training;Functional mobility training;Therapeutic activities;Therapeutic exercise;Patient/family education   PT Goals (Current goals can be found in the Care Plan section) Acute Rehab PT Goals PT Goal Formulation: With patient Time For Goal Achievement: 10/18/14 Potential to Achieve Goals: Good    Frequency Min 3X/week   Barriers  to discharge        Co-evaluation               End of Session Equipment Utilized During Treatment: Gait belt Activity Tolerance: Patient tolerated treatment well Patient left: with call bell/phone within reach;in chair;with chair alarm set Nurse Communication:  (aware of HR)    Functional Assessment Tool Used: clinical judgement Functional Limitation: Mobility: Walking and moving around Mobility: Walking and Moving Around Current Status [phone]): At least 20 percent but less than 40 percent impaired, limited or restricted Mobility: Walking and Moving Around Goal Status [phone]): At least 1 percent but less than 20 percent impaired, limited or restricted    Time: 4360-6770 PT Time Calculation (min) (ACUTE ONLY): 19 min   Charges:   PT Evaluation $Initial PT Evaluation Tier I: 1 Procedure     PT G Codes:   PT G-Codes **NOT FOR INPATIENT CLASS** Functional Assessment Tool Used: clinical judgement Functional Limitation: Mobility: Walking and moving around Mobility: Walking and Moving Around Current Status (H4035): At least 20 percent but less than 40 percent impaired, limited or restricted Mobility: Walking and Moving Around Goal Status [phone]): At least 1 percent but less than 20 percent impaired, limited or restricted    Paco Cislo,KATHrine E 10/11/2014, 12:45 PM Carmelia Bake, PT, DPT 10/11/2014 Pager: [phone]

## 2014-10-11 NOTE — Evaluation (Signed)
Occupational Therapy Evaluation Patient Details Name: [name] MRN: [id number] DOB: 04-Dec-1931 Today's Date: 10/11/2014    History of Present Illness 81 year old female past history of COPD, atrial fibrillation and multiple myeloma admitted on 7/4 for complaints of left-sided chest pressure as well as shortness of breath. Enzymes minimally elevated on admission, but have not changed overnight. Seen by cardiology who recommended adding low-dose beta blocker and long-acting nitrate patient's medical regimen.  V/Q Low probability of PE   Clinical Impression   Pt was admitted for L sided chest pain and SOB.  She was independent with ADLs prior to admission and currently needs min A for toilet transfer and mod A for LB adls. Will follow in acute with supervision level goals  Follow Up Recommendations  SNF    Equipment Recommendations  3 in 1 bedside comode    Recommendations for Other Services       Precautions / Restrictions Precautions Precautions: Fall Precaution Comments: monitor HR Restrictions Weight Bearing Restrictions: No      Mobility Bed Mobility Overal bed mobility: Needs Assistance Bed Mobility: Supine to Sit     Supine to sit: Supervision;HOB elevated     General bed mobility comments: increased time; used bed rail  Transfers Overall transfer level: Needs assistance Equipment used: Rolling walker (2 wheeled) Transfers: Sit to/from Stand Sit to Stand: Min assist Stand pivot transfers: Min assist       General transfer comment: SPT without AD--pt held to rails of bedside commode; light min A to steady    Balance                                            ADL Overall ADL's : Needs assistance/impaired                         Toilet Transfer: Minimal assistance;Stand-pivot;BSC   Toileting- Clothing Manipulation and Hygiene: Minimal assistance;Sit to/from stand         General ADL Comments: pt with increased  HR/WOB during transfer to 3:1 commode (up to 128).  Pt is able to perform UB adls with set up with rest breaks.  She typically bends forward for LB adls--currently mod A due to fatique.  Pt is very interested in returning to her baseline of independence.  Educated on rest breaks for energy conservation     Vision     Perception     Praxis      Pertinent Vitals/Pain Pain Assessment: No/denies pain     Hand Dominance     Extremity/Trunk Assessment Upper Extremity Assessment Upper Extremity Assessment: Overall WFL for tasks assessed (has tremor, did not interfere with function)          Communication Communication Communication: No difficulties   Cognition Arousal/Alertness: Awake/alert Behavior During Therapy: WFL for tasks assessed/performed Overall Cognitive Status: Within Functional Limits for tasks assessed                     General Comments       Exercises       Shoulder Instructions      Home Living Family/patient expects to be discharged to:: Private residence Living Arrangements: Children Available Help at Discharge: Family;Available PRN/intermittently Type of Home: Mobile home Home Access: Stairs to enter Entrance Stairs-Number of Steps: 4 Entrance Stairs-Rails: Right;Left Home Layout: One level  Bathroom Shower/Tub: Teacher, early years/pre: Standard     Home Equipment: Environmental consultant - 2 wheels   Additional Comments: pt reports daughter works, and currently home with mold      Prior Functioning/Environment Level of Independence: Independent        Comments: states she typically does not use assistive device but has RW    OT Diagnosis: Generalized weakness   OT Problem List: Decreased strength;Decreased activity tolerance;Impaired vision/perception;Decreased knowledge of use of DME or AE   OT Treatment/Interventions: Self-care/ADL training;DME and/or AE instruction;Patient/family education;Balance training;Energy  conservation    OT Goals(Current goals can be found in the care plan section) Acute Rehab OT Goals Patient Stated Goal: get back to doing for myself OT Goal Formulation: With patient Time For Goal Achievement: 10/18/14 Potential to Achieve Goals: Good ADL Goals Pt Will Perform Grooming: with supervision;standing Pt Will Perform Lower Body Bathing: with supervision;with adaptive equipment;sit to/from stand Pt Will Perform Lower Body Dressing: with supervision;with adaptive equipment;sit to/from stand Pt Will Transfer to Toilet: with supervision;ambulating;bedside commode Pt Will Perform Toileting - Clothing Manipulation and hygiene: with supervision;sit to/from stand Additional ADL Goal #1: pt will initiate rest break without cues for energy conservation  OT Frequency: Min 2X/week   Barriers to D/C:            Co-evaluation              End of Session    Activity Tolerance: Patient limited by fatigue Patient left: in bed;with call bell/phone within reach;with bed alarm set   Time: 1331-1345 OT Time Calculation (min): 14 min Charges:  OT General Charges $OT Visit: 1 Procedure OT Evaluation $Initial OT Evaluation Tier I: 1 Procedure G-Codes:    Dalasia Predmore 2014/11/10, 1:59 PM  Lesle Chris, OTR/L [phone] 11/10/14

## 2014-10-11 NOTE — Discharge Instructions (Signed)

## 2014-10-11 NOTE — Progress Notes (Signed)
PROGRESS NOTE  [name] LNL:[id number] DOB: [birth year] DOA: 10/09/2014 PCP: [name], MD  HPI/Recap of past 73 hours: 81 year old female past history of COPD, atrial fibrillation and multiple myeloma admitted on 7/4 for complaints of left-sided chest pressure as well as shortness of breath. Enzymes minimally elevated on admission, but have not changed overnight. Seen by cardiology who recommended adding low-dose beta blocker and long-acting nitrate patient's medical regimen.  Patient had an elevated d-dimer and persistent shortness of breath despite chest pain resolution after admission. D-dimer checked and found be elevated, so patient underwent VQ scan. Unable to do CT angiogram due to elevated BUN. VQ scan was low probability for pulmonary embolus. Patient's shortness of breath have resolved by today. Patient's chads 2 vas score was 7 and cardiology recommended long-term anticoagulation. This was discussed with her oncologist, Dr. Alen Blew.  Given her multiple myeloma and concern for mild pancytopenia. Nevertheless, oncology felt that there is no current medications and it was preferred should be on anticoagulation. Xarelto started.  Patient has no new complaints besides her blood pressure getting elevated with activity  Assessment/Plan: Principal Problem:   Chest pain: Chest pain resolved. At this time felt to be non-cardiac in nature. Patient has have risk factors but she does not want anything invasive done. Continue with recommendations from cardiology to add nitrates and low-dose beta blocker  Active Problems:   COPD (chronic obstructive pulmonary disease): Stable   Hypertension: As above    SSS (sick sinus syndrome) - recommendations per cardiology    Pancytopenia due to antineoplastic chemotherapy treating multiple myeloma: Overall stable. Being followed by oncology.    A-fib/history of sick sinus syndrome: Beta blocker added and cardiology up titrating.   Elevated  troponin: Secondary to atrial fibrillation. No signs of acute coronary syndrome.   Code Status: DO NOT RESUSCITATE  Family Communication: none at bedside. Discussed directly with patient.  Disposition Plan:  Patient has requested skilled nursing facility, d/c social worker. PT and OT consults pending Possible discharge to skilled nursing once ready from cardiac standpoint.   Consultants:  Cardiology  Case discussed with patient's oncologist  Procedures:  V/Q scan: Low probability of pulmonary embolus  Antibiotics:  None   Objective: BP 127/63 mmHg  Pulse 98  Temp(Src) 98.7 F (37.1 C) (Oral)  Resp 18  Ht 5' (1.524 m)  Wt 56.9 kg (125 lb 7.1 oz)  BMI 24.50 kg/m2  SpO2 99%  Intake/Output Summary (Last 24 hours) at 10/11/14 1543 Last data filed at 10/11/14 1044  Gross per 24 hour  Intake    360 ml  Output    300 ml  Net     60 ml   Filed Weights   10/09/14 1832  Weight: 56.9 kg (125 lb 7.1 oz)    Exam:   General: Alert and oriented 3, no acute distress  Cardiovascular: Irregular rhythm, rate controlled  Respiratory: Clear to auscultation bilaterally  Abdomen: Soft, nontender, nondistended, positive bowel sounds  Musculoskeletal: No clubbing or cyanosis, trace edema   Data Reviewed: Basic Metabolic Panel:  Recent Labs Lab 10/06/14 1429 10/09/14 1524 10/10/14 0020 10/10/14 1255 10/11/14 0409  NA 137 136  --  138 138  K 4.5 3.4*  --  3.3* 3.9  CL  --  106  --  109 108  CO2 21* 22  --  21* 23  GLUCOSE 180* 121*  --  115* 117*  BUN 24.6 28*  --  33* 37*  CREATININE 1.1 1.01*  --  1.09* 1.21*  CALCIUM 9.2 8.5*  --  8.1* 8.4*  MG  --   --  1.6*  --  1.6*   Liver Function Tests:  Recent Labs Lab 10/06/14 1429 10/09/14 1524  AST 36* 25  ALT 29 21  ALKPHOS 69 48  BILITOT 0.80 0.5  PROT 8.7* 7.4  ALBUMIN 3.3* 3.1*    Recent Labs Lab 10/09/14 1524  LIPASE 25   No results for input(s): AMMONIA in the last 168 hours. CBC:  Recent  Labs Lab 10/06/14 1429 10/09/14 1524 10/11/14 0409  WBC 4.5 2.5* 4.0  NEUTROABS 3.3  --   --   HGB 9.1* 9.0* 8.4*  HCT 27.0* 27.0* 25.3*  MCV 98.2 98.5 97.3  PLT 97* 86* 84*   Cardiac Enzymes:    Recent Labs Lab 10/09/14 1920 10/09/14 2155 10/10/14 0020  TROPONINI 0.11* 0.10* 0.09*   BNP (last 3 results)  Recent Labs  05/23/14 1445 08/13/14 1216 10/09/14 1524  BNP 537.1* 150.0* 537.4*    ProBNP (last 3 results)  Recent Labs  02/08/14 1727  PROBNP 121.3    CBG: No results for input(s): GLUCAP in the last 168 hours.  No results found for this or any previous visit (from the past 240 hour(s)).   Studies: No results found.  Scheduled Meds: . aspirin  324 mg Oral Once  . ezetimibe  10 mg Oral QHS  . feeding supplement (ENSURE ENLIVE)  237 mL Oral TID BM  . isosorbide mononitrate  30 mg Oral Daily  . [START ON 10/12/2014] magnesium oxide  400 mg Oral BID  . megestrol  400 mg Oral BID  . [START ON 10/12/2014] metoprolol succinate  25 mg Oral Daily  . pantoprazole  40 mg Oral BID  . polyethylene glycol  17 g Oral Daily  . prochlorperazine  10 mg Oral TID  . rivaroxaban  15 mg Oral Q supper    Continuous Infusions:    Time spent: 25 min  Velvet Bathe  [facility] Pager [phone] If 7PM-7AM, please contact night-coverage at [email or web address], password [facility] 10/11/2014, 3:43 PM

## 2014-10-12 DIAGNOSIS — I480 Paroxysmal atrial fibrillation: Secondary | ICD-10-CM | POA: Diagnosis not present

## 2014-10-12 DIAGNOSIS — I10 Essential (primary) hypertension: Secondary | ICD-10-CM | POA: Diagnosis not present

## 2014-10-12 DIAGNOSIS — R7989 Other specified abnormal findings of blood chemistry: Secondary | ICD-10-CM | POA: Diagnosis not present

## 2014-10-12 DIAGNOSIS — I4891 Unspecified atrial fibrillation: Secondary | ICD-10-CM

## 2014-10-12 DIAGNOSIS — R079 Chest pain, unspecified: Secondary | ICD-10-CM | POA: Diagnosis not present

## 2014-10-12 LAB — CBC
HEMATOCRIT: 24.7 % — AB (ref 36.0–46.0)
HEMOGLOBIN: 7.8 g/dL — AB (ref 12.0–15.0)
MCH: 31.6 pg (ref 26.0–34.0)
MCHC: 31.6 g/dL (ref 30.0–36.0)
MCV: 100 fL (ref 78.0–100.0)
PLATELETS: 79 10*3/uL — AB (ref 150–400)
RBC: 2.47 MIL/uL — AB (ref 3.87–5.11)
RDW: 21.8 % — ABNORMAL HIGH (ref 11.5–15.5)
WBC: 4.8 10*3/uL (ref 4.0–10.5)

## 2014-10-12 LAB — BASIC METABOLIC PANEL
ANION GAP: 4 — AB (ref 5–15)
BUN: 27 mg/dL — ABNORMAL HIGH (ref 6–20)
CHLORIDE: 108 mmol/L (ref 101–111)
CO2: 25 mmol/L (ref 22–32)
Calcium: 8.2 mg/dL — ABNORMAL LOW (ref 8.9–10.3)
Creatinine, Ser: 1.05 mg/dL — ABNORMAL HIGH (ref 0.44–1.00)
GFR calc non Af Amer: 48 mL/min — ABNORMAL LOW (ref >60)
GFR, EST AFRICAN AMERICAN: 56 mL/min — AB (ref >60)
Glucose, Bld: 116 mg/dL — ABNORMAL HIGH (ref 65–99)
POTASSIUM: 4.3 mmol/L (ref 3.5–5.1)
SODIUM: 137 mmol/L (ref 135–145)

## 2014-10-12 LAB — MAGNESIUM: Magnesium: 2.1 mg/dL (ref 1.7–2.4)

## 2014-10-12 MED ORDER — SENNA 8.6 MG PO TABS
2.0000 | ORAL_TABLET | Freq: Every day | ORAL | Status: DC
  Administered 2014-10-12 – 2014-10-13 (×2): 17.2 mg via ORAL
  Filled 2014-10-12 (×2): qty 2

## 2014-10-12 NOTE — Progress Notes (Signed)
PROGRESS NOTE  [name] UZH:[id number] DOB: [birth year] DOA: 10/09/2014 PCP: [name], MD  HPI/Recap of past 79 hours: 81 year old female past history of COPD, atrial fibrillation and multiple myeloma admitted on 7/4 for complaints of left-sided chest pressure as well as shortness of breath. Enzymes minimally elevated on admission, but have not changed overnight. Seen by cardiology who recommended adding low-dose beta blocker and long-acting nitrate patient's medical regimen.  Patient had an elevated d-dimer and persistent shortness of breath despite chest pain resolution after admission. D-dimer checked and found be elevated, so patient underwent VQ scan. Unable to do CT angiogram due to elevated BUN. VQ scan was low probability for pulmonary embolus. Patient's shortness of breath have resolved by today. Patient's chads 2 vas score was 7 and cardiology recommended long-term anticoagulation. This was discussed with her oncologist, Dr. Alen Blew.  Given her multiple myeloma and concern for mild pancytopenia. Nevertheless, oncology felt that there is no current medications and it was preferred should be on anticoagulation. Xarelto started.  Patient has no new complaints besides her blood pressure getting elevated with activity  Assessment/Plan: Principal Problem:   Chest pain: Chest pain resolved. At this time felt to be non-cardiac in nature. Patient has have risk factors but she does not want anything invasive done.  Continue with recommendations from cardiology, please refer to their note  Active Problems:   COPD (chronic obstructive pulmonary disease): Stable   Hypertension: As above    SSS (sick sinus syndrome) - recommendations per cardiology    Pancytopenia due to antineoplastic chemotherapy treating multiple myeloma: Overall stable. Being followed by oncology. - Will reassess cbc next am. May have to hold anticoagulation if hgb continues to drop.    A-fib/history of sick  sinus syndrome: Beta blocker added and cardiology up titrating.   Elevated troponin: Secondary to atrial fibrillation. No signs of acute coronary syndrome.   Code Status: DO NOT RESUSCITATE  Family Communication: none at bedside. Discussed directly with patient.  Disposition Plan:  Patient has requested skilled nursing facility, d/c social worker. PT and OT consults pending Possible discharge to skilled nursing once ready from cardiac standpoint.   Consultants:  Cardiology  Case discussed with patient's oncologist  Procedures:  V/Q scan: Low probability of pulmonary embolus  Antibiotics:  None   Objective: BP 115/68 mmHg  Pulse 68  Temp(Src) 98.5 F (36.9 C) (Oral)  Resp 16  Ht 5' (1.524 m)  Wt 56.9 kg (125 lb 7.1 oz)  BMI 24.50 kg/m2  SpO2 98%  Intake/Output Summary (Last 24 hours) at 10/12/14 1635 Last data filed at 10/12/14 1130  Gross per 24 hour  Intake    840 ml  Output     75 ml  Net    765 ml   Filed Weights   10/09/14 1832  Weight: 56.9 kg (125 lb 7.1 oz)    Exam:   General: Alert and oriented 3, no acute distress  Cardiovascular: Irregular rhythm, rate controlled  Respiratory: Clear to auscultation bilaterally, no wheezes  Abdomen: Soft, nontender, nondistended, positive bowel sounds  Musculoskeletal: No clubbing or cyanosis, trace edema   Data Reviewed: Basic Metabolic Panel:  Recent Labs Lab 10/06/14 1429 10/09/14 1524 10/10/14 0020 10/10/14 1255 10/11/14 0409 10/12/14 0423  NA 137 136  --  138 138 137  K 4.5 3.4*  --  3.3* 3.9 4.3  CL  --  106  --  109 108 108  CO2 21* 22  --  21* 23 25  GLUCOSE  180* 121*  --  115* 117* 116*  BUN 24.6 28*  --  33* 37* 27*  CREATININE 1.1 1.01*  --  1.09* 1.21* 1.05*  CALCIUM 9.2 8.5*  --  8.1* 8.4* 8.2*  MG  --   --  1.6*  --  1.6* 2.1   Liver Function Tests:  Recent Labs Lab 10/06/14 1429 10/09/14 1524  AST 36* 25  ALT 29 21  ALKPHOS 69 48  BILITOT 0.80 0.5  PROT 8.7* 7.4   ALBUMIN 3.3* 3.1*    Recent Labs Lab 10/09/14 1524  LIPASE 25   No results for input(s): AMMONIA in the last 168 hours. CBC:  Recent Labs Lab 10/06/14 1429 10/09/14 1524 10/11/14 0409 10/12/14 0423  WBC 4.5 2.5* 4.0 4.8  NEUTROABS 3.3  --   --   --   HGB 9.1* 9.0* 8.4* 7.8*  HCT 27.0* 27.0* 25.3* 24.7*  MCV 98.2 98.5 97.3 100.0  PLT 97* 86* 84* 79*   Cardiac Enzymes:    Recent Labs Lab 10/09/14 1920 10/09/14 2155 10/10/14 0020  TROPONINI 0.11* 0.10* 0.09*   BNP (last 3 results)  Recent Labs  05/23/14 1445 08/13/14 1216 10/09/14 1524  BNP 537.1* 150.0* 537.4*    ProBNP (last 3 results)  Recent Labs  02/08/14 1727  PROBNP 121.3    CBG: No results for input(s): GLUCAP in the last 168 hours.  No results found for this or any previous visit (from the past 240 hour(s)).   Studies: No results found.  Scheduled Meds: . aspirin  324 mg Oral Once  . ezetimibe  10 mg Oral QHS  . feeding supplement (ENSURE ENLIVE)  237 mL Oral TID BM  . isosorbide mononitrate  30 mg Oral Daily  . magnesium oxide  400 mg Oral BID  . megestrol  400 mg Oral BID  . metoprolol succinate  25 mg Oral Daily  . pantoprazole  40 mg Oral BID  . polyethylene glycol  17 g Oral Daily  . prochlorperazine  10 mg Oral TID  . rivaroxaban  15 mg Oral Q supper    Continuous Infusions:    Time spent: 25 min  Velvet Bathe  [facility] Pager [phone] If 7PM-7AM, please contact night-coverage at [email or web address], password [facility] 10/12/2014, 4:35 PM

## 2014-10-12 NOTE — Progress Notes (Addendum)
Patient: [name] / Admit Date: 10/09/2014 / Date of Encounter: 10/12/2014, 7:45 AM   Subjective:  No further chest pain. No SOB. No awareness of palpitations.   Objective: Telemetry: - converted to NSR with PACs, SB around midnight             Physical Exam: Blood pressure 125/75, pulse 72, temperature 97.6 F (36.4 C), temperature source Oral, resp. rate 18, height 5' (1.524 m), weight 125 lb 7.1 oz (56.9 kg), SpO2 96 %. General: Thin pale elderly WF in no acute distress. Head: Normocephalic, atraumatic, sclera non-icteric, no xanthomas, nares are without discharge. Neck:  JVP not elevated. Lungs: Clear bilaterally to auscultation without wheezes, rales, or rhonchi. Breathing is unlabored. Heart: RRR with soft systolic murmur LSB  Abdomen: Soft, non-tender, non-distended  Extremities: No clubbing or cyanosis. No edema.  Neuro: Alert and oriented X 3. Moves all extremities spontaneously. Psych: Responds to questions appropriately with a normal affect.   Intake/Output Summary (Last 24 hours) at 10/12/14 0745 Last data filed at 10/11/14 2030  Gross per 24 hour  Intake    720 ml  Output    100 ml  Net    620 ml    Inpatient Medications:  . aspirin  324 mg Oral Once  . ezetimibe  10 mg Oral QHS  . feeding supplement (ENSURE ENLIVE)  237 mL Oral TID BM  . isosorbide mononitrate  30 mg Oral Daily  . magnesium oxide  400 mg Oral BID  . megestrol  400 mg Oral BID  . metoprolol succinate  25 mg Oral Daily  . pantoprazole  40 mg Oral BID  . polyethylene glycol  17 g Oral Daily  . prochlorperazine  10 mg Oral TID  . rivaroxaban  15 mg Oral Q supper   Infusions:    Labs:  Recent Labs  10/11/14 0409 10/12/14 0423  NA 138 137  K 3.9 4.3  CL 108 108  CO2 23 25  GLUCOSE 117* 116*  BUN 37* 27*  CREATININE 1.21* 1.05*  CALCIUM 8.4* 8.2*  MG 1.6* 2.1    Recent Labs  10/09/14 1524  AST 25  ALT 21  ALKPHOS 48  BILITOT 0.5  PROT 7.4  ALBUMIN 3.1*     Recent Labs  10/11/14 0409 10/12/14 0423  WBC 4.0 4.8  HGB 8.4* 7.8*  HCT 25.3* 24.7*  MCV 97.3 100.0  PLT 84* 79*    Recent Labs  10/09/14 1920 10/09/14 2155 10/10/14 0020  TROPONINI 0.11* 0.10* 0.09*   Invalid input(s): POCBNP No results for input(s): HGBA1C in the last 72 hours.   Radiology/Studies:  Dg Chest 2 View  10/09/2014   CLINICAL DATA:  Acute onset of generalized weakness and chest pain. Initial encounter.  EXAM: CHEST  2 VIEW  COMPARISON:  Chest radiograph performed earlier today at 3:39 p.m.  FINDINGS: The lungs are well-aerated. Mild vascular congestion is noted. Mild bibasilar atelectasis is seen. No pleural effusion or pneumothorax is identified.  The heart is borderline enlarged. The patient is status post median sternotomy. No acute osseous abnormalities are seen.  IMPRESSION: Mild vascular congestion and borderline cardiomegaly. Mild bibasilar atelectasis seen.   Electronically Signed   By: Roanna Raider M.D.   On: 10/09/2014 17:26   Nm Pulmonary Perf And Vent  10/10/2014   CLINICAL DATA:  Chest pain  EXAM: NUCLEAR MEDICINE VENTILATION - PERFUSION LUNG SCAN    IMPRESSION: Findings consistent with a low probability for acute pulmonary embolism.  Electronically Signed   By: David  Martinique M.D.   On: 10/10/2014 11:54       Assessment and Plan  89F with CAD s/p CABG 2005, last cath less than 1 year ago showed atretic LIMA but patent LAD and SVG to RCA. Non obstructive disease was the main finding, HTN, COPD, multiple myeloma on Velcade with anemia and thrombocytopenia, sinus bradycardia (not on BB due to this), and a recent TIA (09/10/14) admitted 10/09/14 with fatigue, dyspnea, and chest pain.  Hgb 9.0 on adm. H/o EGD in 05/2014 demonstrating one small gastric body AVM. Most recent echo 09/11/14: EF 60-65%, mildly dilated LA/RA, mild TR - (done for TIA last month).  1. Chest pain with mildly elevated troponin, h/o CAD s/p CABG -with low risk recent cath- troponin  trend is flat.  Elevated D-dimer, VQ low prob for PE.   2. Newly recognized PAF with SSS - now back in NSR/ SB. CHADSVASC 7. Dr. Tamala Julian felt she would be a candidate to try on anticoagulation and Dr. Alen Blew cleared her for this. She is now on Xarelto $RemoveBe'15mg'zCugEmwQp$  daily. Given her comorbidities and the initiation of Xarelto, will d/c aspirin. Low dose BB added - not being aggressively titrated due to h/o bradycardia requiring d/c of BB in the past.   3. Multiple myeloma with anemia, leukopenia, thrombocytopenia - her Hgb is trending down. Will review plans for anticoagulation with MD.   4. CKD stage IIIA - baseline Cr appears 1.1-1.2.  K+ and Mg++ WNL this am.    Angelena Form PA- [phone] 10/12/2014 8:00 AM     The patient was seen in conjunction with Kerin Ransom, PAC. All aspects of care were reviewed and discussed. Hemoglobin is down slightly. Now back in NSR, therefore having PAF and need for anticoagulation is real. Plan continue Xarelto and follow hemoglobin closely.  I previously assumed that AF would be chronic, but this is obviously not the case. Therefore, considering rhythm control needs to be addressed. Amio would be best. Adding an antiarhthymic increases the risk of needing pacemaker therapy. For now will stay course without antiarrhythmic.

## 2014-10-12 NOTE — Progress Notes (Signed)
Occupational Therapy Treatment Patient Details Name: [name] MRN: [id number] DOB: 02/04/1932 Today's Date: 10/12/2014    History of present illness 81-year-old female past history of COPD, atrial fibrillation and multiple myeloma admitted on 7/4 for complaints of left-sided chest pressure as well as shortness of breath. Enzymes minimally elevated on admission, but have not changed overnight. Seen by cardiology who recommended adding low-dose beta blocker and long-acting nitrate patient's medical regimen.  V/Q Low probability of PE   OT comments  Pt practiced up to the sink with hand held assist to groom and then noted pt noted to have BM while standing. Assisted onto BSC. Pt also noted to have tremors in UEs which pt states she has had premorbidly. Will continue to follow. Recommend SNF at d/c.    Follow Up Recommendations  SNF;Supervision/Assistance - 24 hour    Equipment Recommendations  3 in 1 bedside comode    Recommendations for Other Services      Precautions / Restrictions Precautions Precautions: Fall Precaution Comments: monitor HR Restrictions Weight Bearing Restrictions: No       Mobility Bed Mobility Overal bed mobility: Needs Assistance Bed Mobility: Supine to Sit     Supine to sit: Supervision        Transfers Overall transfer level: Needs assistance Equipment used: None Transfers: Sit to/from Stand;Stand Pivot Transfers Sit to Stand: Min assist Stand pivot transfers: Min assist       General transfer comment: cues for hand placement and backing up to surface before sitting.     Balance                                   ADL       Grooming: Minimal assistance;Standing                   Toilet Transfer: Minimal assistance;Stand-pivot;BSC   Toileting- Clothing Manipulation and Hygiene: Moderate assistance;Sit to/from stand         General ADL Comments: Pt stood at the sink to brush her teeth and dentures. She  had a small amount of BM that she was not aware of while standing at the sink that landed on floor. Assisted pt onto BSC and pt had small amount more of BM while on commode. Pt requires min assist to steady with transfer from EOB over to the sink HHA and then to step around to BSC. Pt states she feels mildly short of breath with activity but sats on RA 100%.      Vision                     Perception     Praxis      Cognition   Behavior During Therapy: WFL for tasks assessed/performed Overall Cognitive Status: Within Functional Limits for tasks assessed                       Extremity/Trunk Assessment               Exercises     Shoulder Instructions       General Comments      Pertinent Vitals/ Pain       Pain Assessment: No/denies pain  Home Living                                            Prior Functioning/Environment              Frequency Min 2X/week     Progress Toward Goals  OT Goals(current goals can now be found in the care plan section)  Progress towards OT goals: Progressing toward goals     Plan Discharge plan remains appropriate    Co-evaluation                 End of Session Equipment Utilized During Treatment: Gait belt   Activity Tolerance Patient tolerated treatment well   Patient Left in chair;with call bell/phone within reach;with chair alarm set   Nurse Communication          Time: 1012-1035 OT Time Calculation (min): 23 min  Charges: OT General Charges $OT Visit: 1 Procedure OT Treatments $Therapeutic Activity: 8-22 mins  Stone, Stephanie Stafford  [phone] 10/12/2014, 12:12 PM    

## 2014-10-13 ENCOUNTER — Other Ambulatory Visit: Payer: Medicare Other

## 2014-10-13 ENCOUNTER — Ambulatory Visit: Payer: Medicare Other

## 2014-10-13 DIAGNOSIS — I4891 Unspecified atrial fibrillation: Secondary | ICD-10-CM | POA: Diagnosis not present

## 2014-10-13 DIAGNOSIS — I495 Sick sinus syndrome: Secondary | ICD-10-CM | POA: Diagnosis not present

## 2014-10-13 DIAGNOSIS — R7989 Other specified abnormal findings of blood chemistry: Secondary | ICD-10-CM

## 2014-10-13 DIAGNOSIS — I10 Essential (primary) hypertension: Secondary | ICD-10-CM | POA: Diagnosis not present

## 2014-10-13 DIAGNOSIS — R079 Chest pain, unspecified: Secondary | ICD-10-CM | POA: Diagnosis not present

## 2014-10-13 LAB — CBC
HCT: 25.1 % — ABNORMAL LOW (ref 36.0–46.0)
Hemoglobin: 8.1 g/dL — ABNORMAL LOW (ref 12.0–15.0)
MCH: 32.3 pg (ref 26.0–34.0)
MCHC: 32.3 g/dL (ref 30.0–36.0)
MCV: 100 fL (ref 78.0–100.0)
Platelets: 91 10*3/uL — ABNORMAL LOW (ref 150–400)
RBC: 2.51 MIL/uL — ABNORMAL LOW (ref 3.87–5.11)
RDW: 21.7 % — ABNORMAL HIGH (ref 11.5–15.5)
WBC: 4.5 10*3/uL (ref 4.0–10.5)

## 2014-10-13 LAB — BASIC METABOLIC PANEL
Anion gap: 4 — ABNORMAL LOW (ref 5–15)
BUN: 22 mg/dL — ABNORMAL HIGH (ref 6–20)
CO2: 25 mmol/L (ref 22–32)
Calcium: 8.3 mg/dL — ABNORMAL LOW (ref 8.9–10.3)
Chloride: 108 mmol/L (ref 101–111)
Creatinine, Ser: 0.93 mg/dL (ref 0.44–1.00)
GFR calc Af Amer: >60 mL/min (ref >60)
GFR calc non Af Amer: 56 mL/min — ABNORMAL LOW (ref >60)
Glucose, Bld: 112 mg/dL — ABNORMAL HIGH (ref 65–99)
Potassium: 4.3 mmol/L (ref 3.5–5.1)
Sodium: 137 mmol/L (ref 135–145)

## 2014-10-13 LAB — MAGNESIUM: Magnesium: 1.9 mg/dL (ref 1.7–2.4)

## 2014-10-13 MED ORDER — METOPROLOL SUCCINATE ER 25 MG PO TB24
25.0000 mg | ORAL_TABLET | Freq: Every day | ORAL | Status: DC
Start: 2014-10-13 — End: 2014-12-26

## 2014-10-13 MED ORDER — RIVAROXABAN 15 MG PO TABS: 15.0000 mg | ORAL_TABLET | Freq: Every day | ORAL | Status: DC

## 2014-10-13 MED ORDER — ISOSORBIDE MONONITRATE ER 30 MG PO TB24: 30.0000 mg | ORAL_TABLET | Freq: Every day | ORAL | Status: DC

## 2014-10-13 NOTE — H&P (Signed)
Patient being discharged to facility. Transport called to [facility].  Patient taken with all belongings to be transported via ambulance.

## 2014-10-13 NOTE — Progress Notes (Addendum)
   10/11/14 1300  OT G-codes **NOT FOR INPATIENT CLASS**  Functional Assessment Tool Used clinical judgment  Functional Limitation Self care  Self Care Current Status [phone]) CK  Self Care Goal Status [phone]) CI  Lesle Chris, OTR/L [phone] 10/13/2014

## 2014-10-13 NOTE — Clinical Social Work Placement (Signed)
Patient is set to discharge to [facility] today. Patient & daughter, [name] aware. Discharge packet given to RN, Robert Bellow. [facility] called for transport.     Raynaldo Opitz, [facility] Clinical Social Worker cell #: [phone]    CLINICAL SOCIAL WORK PLACEMENT  NOTE  Date:  10/13/2014  Patient Details  Name: [name] MRN: [id number] Date of Birth: 08/18/1931  Clinical Social Work is seeking post-discharge placement for this patient at the [place] level of care (*CSW will initial, date and re-position this form in  chart as items are completed):  Yes   Patient/family provided with [facility] Work Department's list of facilities offering this level of care within the geographic area requested by the patient (or if unable, by the patient's family).  Yes   Patient/family informed of their freedom to choose among providers that offer the needed level of care, that participate in Medicare, Medicaid or managed care program needed by the patient, have an available bed and are willing to accept the patient.  Yes   Patient/family informed of [facility]'s ownership interest in [facility] and [facility], as well as of the fact that they are under no obligation to receive care at these facilities.  PASRR submitted to EDS on 10/10/14     PASRR number received on 10/10/14     Existing PASRR number confirmed on       FL2 transmitted to all facilities in geographic area requested by pt/family on 10/10/14     FL2 transmitted to all facilities within larger geographic area on       Patient informed that his/her managed care company has contracts with or will negotiate with certain facilities, including the following:        Yes   Patient/family informed of bed offers received.  Patient chooses bed at [facility]     Physician recommends and patient chooses bed at      Patient to be transferred to  [facility] on 10/13/14.  Patient to be transferred to facility by [facility]     Patient family notified on 10/13/14 of transfer.  Name of family member notified:  patient's daughter, [name] via phone     PHYSICIAN       Additional Comment:    _______________________________________________ Standley Brooking, LCSW 10/13/2014, 3:10 PM

## 2014-10-13 NOTE — Progress Notes (Signed)
Physical Therapy Treatment Patient Details Name: [name] MRN: [id number] DOB: March 05, 1932 Today's Date: 10/13/2014    History of Present Illness 81 year old female past history of COPD, atrial fibrillation and multiple myeloma admitted on 7/4 for complaints of left-sided chest pressure as well as shortness of breath. Enzymes minimally elevated on admission, but have not changed overnight. Seen by cardiology who recommended adding low-dose beta blocker and long-acting nitrate patient's medical regimen.  V/Q Low probability of PE    PT Comments    Pat c/o tailbone discomfort. Allevyn sacral patch placed. Patient improving very well with functional mobility.  Follow Up Recommendations  SNF     Equipment Recommendations  None recommended by PT    Recommendations for Other Services       Precautions / Restrictions Precautions Precautions: Fall Precaution Comments: monitor HR Restrictions Weight Bearing Restrictions: No    Mobility  Bed Mobility   Bed Mobility: Supine to Sit;Sit to Supine     Supine to sit: Supervision     General bed mobility comments: hob raised  Transfers   Equipment used: Rolling walker (2 wheeled) Transfers: Sit to/from [facility] Sit to Stand: Min assist Stand pivot transfers: Min assist       General transfer comment: steadying assistance  Ambulation/Gait Ambulation/Gait assistance: Min assist Ambulation Distance (Feet): 150 Feet (then 250) Assistive device: Rolling walker (2 wheeled)  Cues for safety,  Stops to readjust inside of RW, multimodal cues for safety       General Gait Details: assist to steady, HR in 60's, sats 100% RA   Stairs            Wheelchair Mobility    Modified Rankin (Stroke Patients Only)       Balance                                    Cognition Arousal/Alertness: Awake/alert Behavior During Therapy: WFL for tasks assessed/performed Overall Cognitive  Status: Within Functional Limits for tasks assessed                      Exercises      General Comments        Pertinent Vitals/Pain Pain Assessment: No/denies pain    Home Living                      Prior Function            PT Goals (current goals can now be found in the care plan section) Progress towards PT goals: Progressing toward goals    Frequency  Min 3X/week    PT Plan Current plan remains appropriate    Co-evaluation             End of Session   Activity Tolerance: Patient tolerated treatment well Patient left: in bed;with call bell/phone within reach;with bed alarm set     Time: 1359-1431 PT Time Calculation (min) (ACUTE ONLY): 32 min  Charges:  $Gait Training: 23-37 mins                    G Codes:      Claretha Cooper 10/13/2014, 2:41 PM

## 2014-10-13 NOTE — Progress Notes (Signed)
[facility] for Xarelto Indication: Atrial Fibrillation  Allergies  Allergen Reactions  . Lexapro [Escitalopram Oxalate] Other (See Comments)    Fatigue   . Soma [Carisoprodol] Other (See Comments)    Fatigue   . Wellbutrin [Bupropion] Other (See Comments)    "couldn't function and take it"  . Albuterol Sulfate Other (See Comments)    Albuterol HFA inhaler caused nervousness   . Codeine Hives  . Levaquin [Levofloxacin In D5w] Other (See Comments)    Unknown allergic reaction  . Plavix [Clopidogrel Bisulfate] Other (See Comments)    Unknown reaction  . Statins     Patient Measurements: Height: 5' (152.4 cm) Weight: 125 lb 7.1 oz (56.9 kg) IBW/kg (Calculated) : 45.5 Heparin Dosing Weight:   Vital Signs: Temp: 98.2 F (36.8 C) (07/08 0658) Temp Source: Oral (07/08 0658) BP: 142/55 mmHg (07/08 0658) Pulse Rate: 98 (07/08 0658)  Labs:  Recent Labs  10/11/14 0409 10/12/14 0423 10/13/14 0433  HGB 8.4* 7.8* 8.1*  HCT 25.3* 24.7* 25.1*  PLT 84* 79* 91*  CREATININE 1.21* 1.05* 0.93    Estimated Creatinine Clearance: 37.5 mL/min (by C-G formula based on Cr of 0.93).   Medical History: Past Medical History  Diagnosis Date  . COPD (chronic obstructive pulmonary disease)   . Gout   . Coronary atherosclerosis of native coronary artery 02/05/2012  . Hypertension   . High blood cholesterol   . Heart murmur     "all my life" (02/05/2012)  . Anginal pain   . Myocardial infarction 1990's; 2000's    "2 total, I think" (02/05/2012)  . Pneumonia     "several times" (02/05/2012)  . Chronic bronchitis   . Shortness of breath     "sometimes when I'm laying down; always when I do too much" (02/05/2012)  . History of blood transfusion   . External bleeding hemorrhoids     "act up at times" (02/05/2012)  . Chronic back pain     "all over" (02/05/2012)  . Depression   . CAD (coronary artery disease) of artery bypass graft 02/09/14   atretic LIMA  . Multiple myeloma 09/07/2014    Assessment: 74 yoF to start Xarelto for new onset atrial fibrillation with RVR. Pt has PMHx pertinent for CKD-III, CAD s/p CABG 2005, sinus bradycardia (therefore not on BB), MM on velcade with anemia and thrombocytopenia, and recent TIA admitted 7/4 with fatigue and chest pain.  Previously not felt to be candidate for anticoagulation given age, falls, and anemia despite CHADSVASC 6-7. Cardiology now in favor of anticoagulation if cleared by oncology.  Pharmacy consulted to start Xarelto today.    ASA stopped 7/6  Today, 10/13/2014   CBC: hgb low (8.1) but stable this am, pltc (91) low but improved  Renal: SCr has improved to 0.93  No major drug interactions  Goal of Therapy:  aPTT 66-102 seconds Monitor platelets by anticoagulation protocol: Yes   Plan:   Continue xarelto $RemoveBeforeDE'15mg'[id number]$  PO qsupper based on renal function  Hgb and pltc better today  Education by pharmacist provided 7/6  Doreene Eland, PharmD, BCPS.   Pager: [phone] 10/13/2014. Now

## 2014-10-13 NOTE — Progress Notes (Signed)
Patient: [name] / Admit Date: 10/09/2014 / Date of Encounter: 10/13/2014, 8:15 AM   Subjective:  No further chest pain. No SOB. No awareness of palpitations.   Objective: Telemetry: - NSR, sinus brady 45-50          Physical Exam: Blood pressure 142/55, pulse 98, temperature 98.2 F (36.8 C), temperature source Oral, resp. rate 18, height 5' (1.524 m), weight 125 lb 7.1 oz (56.9 kg), SpO2 99 %. General: Thin pale elderly WF in no acute distress. Head: Normocephalic, atraumatic, sclera non-icteric, no xanthomas, nares are without discharge. Neck:  JVP not elevated. Lungs: Clear bilaterally to auscultation without wheezes, rales, or rhonchi. Breathing is unlabored. Heart: RRR with soft systolic murmur LSB  Abdomen: Soft, non-tender, non-distended  Extremities: No clubbing or cyanosis. No edema.  Neuro: Alert and oriented X 3. Moves all extremities spontaneously. Psych: Responds to questions appropriately with a normal affect.   Intake/Output Summary (Last 24 hours) at 10/13/14 0815 Last data filed at 10/12/14 1731  Gross per 24 hour  Intake    360 ml  Output    150 ml  Net    210 ml    Inpatient Medications:  .     Once  . ezetimibe  10 mg Oral QHS  . feeding supplement (ENSURE ENLIVE)  237 mL Oral TID BM  . isosorbide mononitrate  30 mg Oral Daily  . magnesium oxide  400 mg Oral BID  . megestrol  400 mg Oral BID  . metoprolol succinate  25 mg Oral Daily  . pantoprazole  40 mg Oral BID  . polyethylene glycol  17 g Oral Daily  . prochlorperazine  10 mg Oral TID  . rivaroxaban  15 mg Oral Q supper  . senna  2 tablet Oral Daily   Infusions:    Labs:  Recent Labs  10/12/14 0423 10/13/14 0433  NA 137 137  K 4.3 4.3  CL 108 108  CO2 25 25  GLUCOSE 116* 112*  BUN 27* 22*  CREATININE 1.05* 0.93  CALCIUM 8.2* 8.3*  MG 2.1 1.9   No results for input(s): AST, ALT, ALKPHOS, BILITOT, PROT, ALBUMIN in the last 72 hours.  Recent Labs  10/12/14 0423  10/13/14 0433  WBC 4.8 4.5  HGB 7.8* 8.1*  HCT 24.7* 25.1*  MCV 100.0 100.0  PLT 79* 91*   No results for input(s): CKTOTAL, CKMB, TROPONINI in the last 72 hours. Invalid input(s): POCBNP No results for input(s): HGBA1C in the last 72 hours.   Radiology/Studies:  Dg Chest 2 View  10/09/2014   CLINICAL DATA:  Acute onset of generalized weakness and chest pain. Initial encounter.  EXAM: CHEST  2 VIEW    IMPRESSION: Mild vascular congestion and borderline cardiomegaly. Mild bibasilar atelectasis seen.   Electronically Signed   By: Garald Balding M.D.   On: 10/09/2014 17:26   Nm Pulmonary Perf And Vent  10/10/2014   CLINICAL DATA:  Chest pain  EXAM: NUCLEAR MEDICINE VENTILATION - PERFUSION LUNG SCAN    IMPRESSION: Findings consistent with a low probability for acute pulmonary embolism.   Electronically Signed   By: David  Martinique M.D.   On: 10/10/2014 11:54       Assessment and Plan  68F with CAD s/p CABG 2005, last cath less than 1 year ago showed atretic LIMA but patent LAD and SVG to RCA. Non obstructive disease was the main finding, HTN, COPD, multiple myeloma on Velcade with anemia and thrombocytopenia, sinus bradycardia (  not on BB due to this), and a recent TIA (09/10/14) admitted 10/09/14 with fatigue, dyspnea, and chest pain.  Hgb 9.0 on adm. H/o EGD in 05/2014 demonstrating one small gastric body AVM. Most recent echo 09/11/14: EF 60-65%, mildly dilated LA/RA, mild TR - (done for TIA last month).  1. Chest pain with mildly elevated troponin, h/o CAD s/p CABG -with low risk recent cath- troponin trend is flat.  Elevated D-dimer, VQ low prob for PE.   2. Newly recognized PAF with SSS - back in NSR/ SB 45-50. CHADSVASC 7. Dr. Tamala Julian felt she would be a candidate to try on anticoagulation and Dr. Alen Blew cleared her for this. She is now on Xarelto 53m daily. Tolerating low dose beta blocker  3. Multiple myeloma with anemia, leukopenia, thrombocytopenia - her Hgb is stable today- 8.1  4. CKD  stage IIIA - baseline Cr appears 1.1-1.2.  K+ and Mg++ corrected  Plan: Continue current cardiac medications, follow Hgb, Dr STamala Julianto see this am.  SAngelena FormPA- [id number]/11/2014 8:15 AM     .

## 2014-10-13 NOTE — Discharge Summary (Signed)
Physician Discharge Summary  [name] OVZ:[id number] DOB: 1931-05-31 DOA: 10/09/2014  PCP: [name], MD  Admit date: 10/09/2014 Discharge date: 10/13/2014  Time spent: > 35 minutes  Recommendations for Outpatient Follow-up:  1. Please monitor CBC within the next week after discharge. 2. Patient was placed on xarelto and aspirin was discontinued  Discharge Diagnoses:  Principal Problem:   Chest pain Active Problems:   COPD (chronic obstructive pulmonary disease)   Hypertension   Protein-calorie malnutrition, severe   Multiple myeloma   SSS (sick sinus syndrome)   Pancytopenia due to antineoplastic chemotherapy   A-fib   Elevated troponin   Elevated d-dimer   Pain in the chest   Discharge Condition: Stable  Diet recommendation: Heart healthy  Filed Weights   10/09/14 1832  Weight: 56.9 kg (125 lb 7.1 oz)    History of present illness:  From original history of present illness: 81 year old female past history of COPD, atrial fibrillation and multiple myeloma admitted on 7/4 for complaints of left-sided chest pressure as well as shortness of breath. Enzymes minimally elevated on admission, but have not changed overnight. Seen by cardiology who recommended adding low-dose beta blocker and long-acting nitrate patient's medical regimen.  Patient had an elevated d-dimer and persistent shortness of breath despite chest pain resolution after admission. D-dimer checked and found be elevated, so patient underwent VQ scan. Unable to do CT angiogram due to elevated BUN. VQ scan was low probability for pulmonary embolus. Patient's shortness of breath have resolved by today. Patient's chads 2 vas score was 7 and cardiology recommended long-term anticoagulation. This was discussed with her oncologist, Dr. Alen Blew. Given her multiple myeloma and concern for mild pancytopenia. Nevertheless, oncology felt that there is no current medications and it was preferred should be on  anticoagulation. Xarelto started.  Hospital Course:  Chest pain: Chest pain resolved. At this time felt to be non-cardiac in nature. Patient has have risk factors but she does not want anything invasive done.  Cardiology monitored patient and recommended the following:  Relatively slow heart rates. Conversion to SB/NSR 2 days ago not associated with pause.  Plan conservative management using Xarelto to prevent CVA and beta blocker for rate control should AF recur. Will not add amiodarone because permanent pacer may become necessary. Home to avoid pacer if possible. May not be able to tolerate even this low dose of beta blocker if HR gets too slow.  We plan no further changes.  Hemoglobin needs close f/u.  Active Problems:  COPD (chronic obstructive pulmonary disease): Stable  Hypertension: As above   SSS (sick sinus syndrome) - Recommend patient follow-up with cardiology as outpatient   Pancytopenia due to antineoplastic chemotherapy treating multiple myeloma: Overall stable. Being followed by oncology. -Hemoglobin increase on last check from prior. We'll recommend CBC continued to be monitored   A-fib/history of sick sinus syndrome: Beta blocker added and cardiology up titrating.  Elevated troponin: Secondary to atrial fibrillation. No signs of acute coronary syndrome.  Procedures:  None  Consultations:  Cardiology  Discharge Exam: Filed Vitals:   10/13/14 1328  BP: 117/58  Pulse: 60  Temp: 98.6 F (37 C)  Resp: 18    General: Patient in no acute distress, alert and awake Cardiovascular: No cyanosis Respiratory: No increased work of breathing, equal chest rise, no audible wheezes  Discharge Instructions   Discharge Instructions    Call MD for:  difficulty breathing, headache or visual disturbances    Complete by:  As directed  Call MD for:  temperature >100.4    Complete by:  As directed      Diet - low sodium heart healthy    Complete by:  As  directed      Discharge instructions    Complete by:  As directed   Please follow up with cardiology in 1-2 weeks or sooner should any new concerns arise.     Increase activity slowly    Complete by:  As directed           Current Discharge Medication List    START taking these medications   Details  isosorbide mononitrate (IMDUR) 30 MG 24 hr tablet Take 1 tablet (30 mg total) by mouth daily. Qty: 30 tablet, Refills: 0    metoprolol succinate (TOPROL-XL) 25 MG 24 hr tablet Take 1 tablet (25 mg total) by mouth daily. Qty: 30 tablet, Refills: 0    Rivaroxaban (XARELTO) 15 MG TABS tablet Take 1 tablet (15 mg total) by mouth daily with supper. Qty: 30 tablet, Refills: 0      CONTINUE these medications which have NOT CHANGED   Details  acetaminophen (TYLENOL) 325 MG tablet Take 2 tablets (650 mg total) by mouth every 6 (six) hours as needed for mild pain (or Fever >/= 101). Qty: 60 tablet, Refills: 0    albuterol-ipratropium (COMBIVENT) 18-103 MCG/ACT inhaler Inhale 1-2 puffs into the lungs every 4 (four) hours as needed for wheezing or shortness of breath.    cholecalciferol (VITAMIN D) 1000 UNITS tablet Take 1,000 Units by mouth every morning.     dexamethasone (DECADRON) 4 MG tablet Take 5 tablets every week before chemotherapy injection. Qty: 40 tablet, Refills: 3    ezetimibe (ZETIA) 10 MG tablet Take 1 tablet (10 mg total) by mouth at bedtime.    feeding supplement, ENSURE ENLIVE, (ENSURE ENLIVE) LIQD Take 237 mLs by mouth 3 (three) times daily between meals. Qty: 237 mL, Refills: 12    megestrol (MEGACE) 400 MG/10ML suspension Take 10 mLs (400 mg total) by mouth 2 (two) times daily. Qty: 240 mL, Refills: 0    Omega-3 Fatty Acids (FISH OIL) 1200 MG CAPS Take 1,200-2,400 mg by mouth 2 (two) times daily. Take 2 capsules (2400 mg) every morning and 1 capsule (1200 mg) every night    pantoprazole (PROTONIX) 40 MG tablet Take 1 tablet (40 mg total) by mouth 2 (two) times  daily. Refills: 0    polyethylene glycol powder (GLYCOLAX/MIRALAX) powder Take 17 g by mouth every morning. Mix with 8 oz liquid and drink Refills: 0    RA SENNA 8.6 MG tablet Take 2 tablets by mouth at bedtime.  Refills: 0    tiotropium (SPIRIVA) 18 MCG inhalation capsule Place 18 mcg into inhaler and inhale daily as needed (shortness of breath).     nitroGLYCERIN (NITROSTAT) 0.4 MG SL tablet Place 0.4 mg under the tongue every 5 (five) minutes as needed for chest pain.      STOP taking these medications     aspirin 325 MG EC tablet      prochlorperazine (COMPAZINE) 10 MG tablet      traMADol (ULTRAM) 50 MG tablet      zolpidem (AMBIEN) 5 MG tablet        Allergies  Allergen Reactions  . Lexapro [Escitalopram Oxalate] Other (See Comments)    Fatigue   . Soma [Carisoprodol] Other (See Comments)    Fatigue   . Wellbutrin [Bupropion] Other (See Comments)    "couldn't function  and take it"  . Albuterol Sulfate Other (See Comments)    Albuterol HFA inhaler caused nervousness   . Codeine Hives  . Levaquin [Levofloxacin In D5w] Other (See Comments)    Unknown allergic reaction  . Plavix [Clopidogrel Bisulfate] Other (See Comments)    Unknown reaction  . Statins       The results of significant diagnostics from this hospitalization (including imaging, microbiology, ancillary and laboratory) are listed below for reference.    Significant Diagnostic Studies: Dg Chest 2 View  10/09/2014   CLINICAL DATA:  Acute onset of generalized weakness and chest pain. Initial encounter.  EXAM: CHEST  2 VIEW  COMPARISON:  Chest radiograph performed earlier today at 3:39 p.m.  FINDINGS: The lungs are well-aerated. Mild vascular congestion is noted. Mild bibasilar atelectasis is seen. No pleural effusion or pneumothorax is identified.  The heart is borderline enlarged. The patient is status post median sternotomy. No acute osseous abnormalities are seen.  IMPRESSION: Mild vascular congestion  and borderline cardiomegaly. Mild bibasilar atelectasis seen.   Electronically Signed   By: Garald Balding M.D.   On: 10/09/2014 17:26   Nm Pulmonary Perf And Vent  10/10/2014   CLINICAL DATA:  Chest pain  EXAM: NUCLEAR MEDICINE VENTILATION - PERFUSION LUNG SCAN  TECHNIQUE: Ventilation images were obtained in multiple projections using inhaled aerosol Tc-75m DTPA. Perfusion images were obtained in multiple projections after intravenous injection of Tc-27m MAA.  RADIOPHARMACEUTICALS:  43.0 mCi Technetium-11m DTPA aerosol inhalation and 6.38 mCi chest x-ray from a skeletal survey dated July 20, 2014 Technetium-53m MAA IV  COMPARISON:  Chest x-ray from a skeletal survey dated July 20, 2014  FINDINGS: Ventilation: No focal ventilation defect.  Perfusion: No wedge shaped peripheral perfusion defects to suggest acute pulmonary embolism.  IMPRESSION: Findings consistent with a low probability for acute pulmonary embolism.   Electronically Signed   By: David  Martinique M.D.   On: 10/10/2014 11:54   Dg Chest Port 1 View  10/09/2014   CLINICAL DATA:  Weakness and chest pain today. Prior myocardial infarction and multiple myeloma.  EXAM: PORTABLE CHEST - 1 VIEW  COMPARISON:  08/15/14  FINDINGS: Chin overlies the apices. Patient rotated right. Mild cardiomegaly. Prior median sternotomy. Right hemidiaphragm elevation. No right-sided pleural effusion. No pneumothorax. Low lung volumes with resultant pulmonary interstitial prominence. Partial obscuration of the lateral left hemidiaphragm, new since the prior exam but similar in appearance to 08/13/2014. Right infrahilar volume loss and atelectasis.  IMPRESSION: Low lung volumes. Partial left hemidiaphragm obscuration laterally. Favored to be technique related or possibly due to atelectasis. Pneumonia cannot be excluded. Consider further evaluation with PA and lateral radiographs.   Electronically Signed   By: Abigail Miyamoto M.D.   On: 10/09/2014 15:49    Microbiology: No  results found for this or any previous visit (from the past 240 hour(s)).   Labs: Basic Metabolic Panel:  Recent Labs Lab 10/09/14 1524 10/10/14 0020 10/10/14 1255 10/11/14 0409 10/12/14 0423 10/13/14 0433  NA 136  --  138 138 137 137  K 3.4*  --  3.3* 3.9 4.3 4.3  CL 106  --  109 108 108 108  CO2 22  --  21* $Re'23 25 25  'kXn$ GLUCOSE 121*  --  115* 117* 116* 112*  BUN 28*  --  33* 37* 27* 22*  CREATININE 1.01*  --  1.09* 1.21* 1.05* 0.93  CALCIUM 8.5*  --  8.1* 8.4* 8.2* 8.3*  MG  --  1.6*  --  1.6* 2.1 1.9   Liver Function Tests:  Recent Labs Lab 10/09/14 1524  AST 25  ALT 21  ALKPHOS 48  BILITOT 0.5  PROT 7.4  ALBUMIN 3.1*    Recent Labs Lab 10/09/14 1524  LIPASE 25   No results for input(s): AMMONIA in the last 168 hours. CBC:  Recent Labs Lab 10/09/14 1524 10/11/14 0409 10/12/14 0423 10/13/14 0433  WBC 2.5* 4.0 4.8 4.5  HGB 9.0* 8.4* 7.8* 8.1*  HCT 27.0* 25.3* 24.7* 25.1*  MCV 98.5 97.3 100.0 100.0  PLT 86* 84* 79* 91*   Cardiac Enzymes:  Recent Labs Lab 10/09/14 1920 10/09/14 2155 10/10/14 0020  TROPONINI 0.11* 0.10* 0.09*   BNP: BNP (last 3 results)  Recent Labs  05/23/14 1445 08/13/14 1216 10/09/14 1524  BNP 537.1* 150.0* 537.4*    ProBNP (last 3 results)  Recent Labs  02/08/14 1727  PROBNP 121.3    CBG: No results for input(s): GLUCAP in the last 168 hours.  Signed:  Velvet Bathe  [facility] 10/13/2014, 2:42 PM

## 2014-10-13 NOTE — Progress Notes (Signed)
Occupational Therapy Treatment Patient Details Name: [name] MRN: [id number] DOB: 1931-08-26 Today's Date: 10/13/2014    History of present illness 81 year old female past history of COPD, atrial fibrillation and multiple myeloma admitted on 7/4 for complaints of left-sided chest pressure as well as shortness of breath. Enzymes minimally elevated on admission, but have not changed overnight. Seen by cardiology who recommended adding low-dose beta blocker and long-acting nitrate patient's medical regimen.  V/Q Low probability of PE   OT comments  Pt is very motivated and tolerating more activity.    Follow Up Recommendations  SNF    Equipment Recommendations  3 in 1 bedside comode    Recommendations for Other Services      Precautions / Restrictions Precautions Precautions: Fall Restrictions Weight Bearing Restrictions: No       Mobility Bed Mobility         Supine to sit: Supervision     General bed mobility comments: hob raised  Transfers     Transfers: Sit to/from [facility] Sit to Stand: Min assist Stand pivot transfers: Min assist       General transfer comment: steadying assistance    Balance                                   ADL Overall ADL's : Needs assistance/impaired     Grooming: Brushing hair;Sitting;Set up       Lower Body Bathing: Moderate assistance;Sit to/from stand           Toilet Transfer: Minimal assistance;Stand-pivot;BSC   Toileting- Clothing Manipulation and Hygiene: Moderate assistance;Sit to/from stand         General ADL Comments: Pt completed ADL on 3:1 commode, stood for hygiene then transferred to recliner, where she combed hair.  encouraged rest breaks      Vision                     Perception     Praxis      Cognition   Behavior During Therapy: [facility] for tasks assessed/performed Overall Cognitive Status: Within Functional Limits for tasks assessed                        Extremity/Trunk Assessment               Exercises     Shoulder Instructions       General Comments      Pertinent Vitals/ Pain       Pain Assessment: No/denies pain  Home Living                                          Prior Functioning/Environment              Frequency Min 2X/week     Progress Toward Goals  OT Goals(current goals can now be found in the care plan section)  Progress towards OT goals: Progressing toward goals     Plan Discharge plan needs to be updated    Co-evaluation                 End of Session     Activity Tolerance Patient tolerated treatment well   Patient Left in chair;with call bell/phone within reach;with chair alarm set   Nurse Communication  Time: 1025-4862 OT Time Calculation (min): 20 min  Charges: OT General Charges $OT Visit: 1 Procedure OT Treatments $Self Care/Home Management : 8-22 mins  Dorma Altman 10/13/2014, 11:50 AM   Lesle Chris, OTR/L [phone] 10/13/2014

## 2014-10-20 ENCOUNTER — Other Ambulatory Visit: Payer: Medicare Other

## 2014-10-20 ENCOUNTER — Ambulatory Visit: Payer: Medicare Other

## 2014-10-25 ENCOUNTER — Telehealth: Payer: Self-pay | Admitting: *Deleted

## 2014-10-25 NOTE — Telephone Encounter (Signed)
This RN left a message on [name]'s cell phone, daughter, per Dr. Alen Blew, patient needs to keep those appointments for Friday, July 22nd. Lab/MD/injection. Instructed Butch Penny to call back if she had any questions or concerns.

## 2014-10-25 NOTE — Telephone Encounter (Signed)
VM message received from pt's daughter regarding appts on Friday. Pt had been in the hospital earlier in the month for heart 'problems' and is now in a skilled nursing facility. Daughter, Butch Penny, is unsure if she is to come for her appts @ [facility] on the 22nd (labs, Dr. Alen Blew, velcade inj). Please advise.

## 2014-10-27 ENCOUNTER — Ambulatory Visit (HOSPITAL_BASED_OUTPATIENT_CLINIC_OR_DEPARTMENT_OTHER): Payer: Medicare Other | Admitting: Physician Assistant

## 2014-10-27 ENCOUNTER — Encounter: Payer: Self-pay | Admitting: Physician Assistant

## 2014-10-27 ENCOUNTER — Other Ambulatory Visit (HOSPITAL_BASED_OUTPATIENT_CLINIC_OR_DEPARTMENT_OTHER): Payer: Medicare Other

## 2014-10-27 ENCOUNTER — Ambulatory Visit (HOSPITAL_BASED_OUTPATIENT_CLINIC_OR_DEPARTMENT_OTHER): Payer: Medicare Other

## 2014-10-27 ENCOUNTER — Telehealth: Payer: Self-pay | Admitting: Oncology

## 2014-10-27 VITALS — BP 122/58 | HR 63 | Temp 98.3°F | Resp 18 | Ht 60.0 in | Wt 131.8 lb

## 2014-10-27 DIAGNOSIS — D6959 Other secondary thrombocytopenia: Secondary | ICD-10-CM | POA: Diagnosis not present

## 2014-10-27 DIAGNOSIS — C9000 Multiple myeloma not having achieved remission: Secondary | ICD-10-CM | POA: Diagnosis not present

## 2014-10-27 DIAGNOSIS — Z5112 Encounter for antineoplastic immunotherapy: Secondary | ICD-10-CM | POA: Diagnosis not present

## 2014-10-27 DIAGNOSIS — D630 Anemia in neoplastic disease: Secondary | ICD-10-CM | POA: Diagnosis not present

## 2014-10-27 DIAGNOSIS — M545 Low back pain: Secondary | ICD-10-CM

## 2014-10-27 DIAGNOSIS — G8929 Other chronic pain: Secondary | ICD-10-CM | POA: Diagnosis not present

## 2014-10-27 DIAGNOSIS — R634 Abnormal weight loss: Secondary | ICD-10-CM

## 2014-10-27 LAB — CBC WITH DIFFERENTIAL/PLATELET
BASO%: 0 % (ref 0.0–2.0)
BASOS ABS: 0 10*3/uL (ref 0.0–0.1)
EOS%: 0 % (ref 0.0–7.0)
Eosinophils Absolute: 0 10*3/uL (ref 0.0–0.5)
HEMATOCRIT: 25.9 % — AB (ref 34.8–46.6)
HGB: 8.5 g/dL — ABNORMAL LOW (ref 11.6–15.9)
LYMPH#: 1.7 10*3/uL (ref 0.9–3.3)
LYMPH%: 43.2 % (ref 14.0–49.7)
MCH: 34.1 pg — AB (ref 25.1–34.0)
MCHC: 32.8 g/dL (ref 31.5–36.0)
MCV: 104 fL — ABNORMAL HIGH (ref 79.5–101.0)
MONO#: 0.3 10*3/uL (ref 0.1–0.9)
MONO%: 8 % (ref 0.0–14.0)
NEUT#: 1.9 10*3/uL (ref 1.5–6.5)
NEUT%: 48.8 % (ref 38.4–76.8)
Platelets: 159 10*3/uL (ref 145–400)
RBC: 2.49 10*6/uL — AB (ref 3.70–5.45)
RDW: 22 % — ABNORMAL HIGH (ref 11.2–14.5)
WBC: 3.9 10*3/uL (ref 3.9–10.3)

## 2014-10-27 LAB — COMPREHENSIVE METABOLIC PANEL (CC13)
ALK PHOS: 48 U/L (ref 40–150)
ALT: 9 U/L (ref 0–55)
AST: 16 U/L (ref 5–34)
Albumin: 3.4 g/dL — ABNORMAL LOW (ref 3.5–5.0)
Anion Gap: 6 mEq/L (ref 3–11)
BUN: 21.6 mg/dL (ref 7.0–26.0)
CALCIUM: 8.8 mg/dL (ref 8.4–10.4)
CHLORIDE: 111 meq/L — AB (ref 98–109)
CO2: 21 mEq/L — ABNORMAL LOW (ref 22–29)
Creatinine: 1.2 mg/dL — ABNORMAL HIGH (ref 0.6–1.1)
EGFR: 44 mL/min/{1.73_m2} — ABNORMAL LOW (ref >90)
Glucose: 137 mg/dl (ref 70–140)
POTASSIUM: 3.9 meq/L (ref 3.5–5.1)
SODIUM: 138 meq/L (ref 136–145)
TOTAL PROTEIN: 7.9 g/dL (ref 6.4–8.3)
Total Bilirubin: 0.4 mg/dL (ref 0.20–1.20)

## 2014-10-27 MED ORDER — ONDANSETRON HCL 8 MG PO TABS
8.0000 mg | ORAL_TABLET | Freq: Once | ORAL | Status: AC
  Administered 2014-10-27: 8 mg via ORAL

## 2014-10-27 MED ORDER — ONDANSETRON HCL 8 MG PO TABS
ORAL_TABLET | ORAL | Status: AC
  Filled 2014-10-27: qty 1

## 2014-10-27 MED ORDER — DEXAMETHASONE 4 MG PO TABS: ORAL_TABLET | ORAL | Status: DC

## 2014-10-27 MED ORDER — ACYCLOVIR 400 MG PO TABS: 400.0000 mg | ORAL_TABLET | Freq: Every day | ORAL | Status: DC

## 2014-10-27 MED ORDER — BORTEZOMIB CHEMO SQ INJECTION 3.5 MG (2.5MG/ML)
1.3000 mg/m2 | Freq: Once | INTRAMUSCULAR | Status: AC
  Administered 2014-10-27: 2 mg via SUBCUTANEOUS
  Filled 2014-10-27: qty 2

## 2014-10-27 NOTE — Telephone Encounter (Signed)
Gave and printed appt sched and avs..NO pof

## 2014-10-27 NOTE — Patient Instructions (Signed)
Continue weekly labs and chemotherapy as scheduled Remember to take your weekly dexamethasone the morning of your treatment with breakfast Take the acyclovir 400 mg by mouth daily Follow up in 2 weeks

## 2014-10-27 NOTE — Progress Notes (Signed)
Hematology and Oncology Follow Up Visit  [name] [id number] 03-Mar-1932 81 y.o. 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 She was found to have IgG level of 3430 and an M spike of 2.9 g/dL on 07/20/2014. Bone marrow biopsy on 08/30/2014 showed 78% plasma cell dyscrasia.   Prior Therapy: Status post packed red cell transfusions during hospitalization in February 2016. This was repeated in May 2016.  She is status post a bone marrow biopsy on 08/30/2014.  Current therapy:  Systemic therapy with Velcade and dexamethasone weekly regimen starting on 09/22/2014. She is here for week 3 of therapy.   Interim History: [name] presents today for a follow-up visit accompanied by her daughter. Since the last visit, she has tolerated this treatment without any complications. She states she was not given her dexamethasone at the skilled nursing facility this morning. She also state that she will be going home from the facility tomorrow. She has participated in her physical therapy and has gotten stronger. Her appetite has improved. She does not have a prescription for her prophylactic acyclovir. She has gained some weight as well. She still has some diffuse pain which is been chronic in nature. She still has some occasional exertional dyspnea and occasional diarrhea. She does not report any new complaints. She has not reported any bone pain, pathological fracture or neuropathy. She is ready to proceed with chemotherapy as ordered.  She does not report any headaches, blurry vision, syncope or seizures. She does not report any fevers, chills, sweats. She does not report any chest pain, palpitation orthopnea. Does not report any cough, hemoptysis or hematemesis. She does report occasional nausea but no vomiting no hematochezia or melena. She does not report any frequency urgency or hesitancy. She does  report chronic back pain which is unchanged. Remaining review of systems unremarkable.   Medications: I have reviewed the patient's current medications.  Current Outpatient Prescriptions  Medication Sig Dispense Refill  . acetaminophen (TYLENOL) 325 MG tablet Take 2 tablets (650 mg total) by mouth every 6 (six) hours as needed for mild pain (or Fever >/= 101). 60 tablet 0  . acyclovir (ZOVIRAX) 400 MG tablet Take 1 tablet (400 mg total) by mouth daily. 30 tablet 3  . albuterol-ipratropium (COMBIVENT) 18-103 MCG/ACT inhaler Inhale 1-2 puffs into the lungs every 4 (four) hours as needed for wheezing or shortness of breath.    . cholecalciferol (VITAMIN D) 1000 UNITS tablet Take 1,000 Units by mouth every morning.     [name] dexamethasone (DECADRON) 4 MG tablet Take 5 tablets every week before chemotherapy injection. 40 tablet 3  . ezetimibe (ZETIA) 10 MG tablet Take 1 tablet (10 mg total) by mouth at bedtime.    . feeding supplement, ENSURE ENLIVE, (ENSURE ENLIVE) LIQD Take 237 mLs by mouth 3 (three) times daily between meals. 237 mL 12  . isosorbide mononitrate (IMDUR) 30 MG 24 hr tablet Take 1 tablet (30 mg total) by mouth daily. 30 tablet 0  . megestrol (MEGACE) 400 MG/10ML suspension Take 10 mLs (400 mg total) by mouth 2 (two) times daily. 240 mL 0  . metoprolol succinate (TOPROL-XL) 25 MG 24 hr tablet Take 1 tablet (25 mg total) by mouth daily. 30 tablet 0  . nitroGLYCERIN (NITROSTAT) 0.4 MG SL tablet Place 0.4 mg under the tongue every 5 (five) minutes as needed for chest pain.    . Omega-3 Fatty  Acids (FISH OIL) 1200 MG CAPS Take 1,200-2,400 mg by mouth 2 (two) times daily. Take 2 capsules (2400 mg) every morning and 1 capsule (1200 mg) every night    . pantoprazole (PROTONIX) 40 MG tablet Take 1 tablet (40 mg total) by mouth 2 (two) times daily.  0  . polyethylene glycol powder (GLYCOLAX/MIRALAX) powder Take 17 g by mouth every morning. Mix with 8 oz liquid and drink  0  . RA SENNA 8.6 MG tablet  Take 2 tablets by mouth at bedtime.   0  . Rivaroxaban (XARELTO) 15 MG TABS tablet Take 1 tablet (15 mg total) by mouth daily with supper. 30 tablet 0  . tiotropium (SPIRIVA) 18 MCG inhalation capsule Place 18 mcg into inhaler and inhale daily as needed (shortness of breath).      No current facility-administered medications for this visit.     Allergies:  Allergies  Allergen Reactions  . Lexapro [Escitalopram Oxalate] Other (See Comments)    Fatigue   . Soma [Carisoprodol] Other (See Comments)    Fatigue   . Wellbutrin [Bupropion] Other (See Comments)    "couldn't function and take it"  . Albuterol Sulfate Other (See Comments)    Albuterol HFA inhaler caused nervousness   . Codeine Hives  . Levaquin [Levofloxacin In D5w] Other (See Comments)    Unknown allergic reaction  . Plavix [Clopidogrel Bisulfate] Other (See Comments)    Unknown reaction  . Statins     Past Medical History, Surgical history, Social history, and Family History were reviewed and updated.   Physical Exam: Blood pressure 122/58, pulse 63, temperature 98.3 F (36.8 C), temperature source Oral, resp. rate 18, height 5' (1.524 m), weight 131 lb 12.8 oz (59.784 kg), SpO2 98 %. ECOG: 1 General appearance: alert and cooperative. Elderly woman without any distress today. Head: Normocephalic, without obvious abnormality Neck: no adenopathy Lymph nodes: Cervical, supraclavicular, and axillary nodes normal. Heart:regular rate and rhythm, S1, S2 normal, no murmur, click, rub or gallop Lung:chest clear, no wheezing, rales, normal symmetric air entry Abdomin: soft, non-tender, without masses or organomegaly EXT:no erythema, induration, or nodules   Lab Results: Lab Results  Component Value Date   WBC 3.9 10/27/2014   HGB 8.5* 10/27/2014   HCT 25.9* 10/27/2014   MCV 104.0* 10/27/2014   PLT 159 10/27/2014     Chemistry      Component Value Date/Time   NA 138 10/27/2014 1438   NA 137 10/13/2014 0433   K  3.9 10/27/2014 1438   K 4.3 10/13/2014 0433   CL 108 10/13/2014 0433   CO2 21* 10/27/2014 1438   CO2 25 10/13/2014 0433   BUN 21.6 10/27/2014 1438   BUN 22* 10/13/2014 0433   CREATININE 1.2* 10/27/2014 1438   CREATININE 0.93 10/13/2014 0433      Component Value Date/Time   CALCIUM 8.8 10/27/2014 1438   CALCIUM 8.3* 10/13/2014 0433   ALKPHOS 48 10/27/2014 1438   ALKPHOS 48 10/09/2014 1524   AST 16 10/27/2014 1438   AST 25 10/09/2014 1524   ALT 9 10/27/2014 1438   ALT 21 10/09/2014 1524   BILITOT 0.40 10/27/2014 1438   BILITOT 0.5 10/09/2014 1524         Impression and Plan:  81 year old woman with the following issues:  1. IgG kappa plasma cell disorder with 78% plasma cell infiltration in the bone marrow. She is quite symptomatic and certainly requires treatment. She meets the criteria of multiple myeloma given her anemia and  overall symptomatology.  She is currently receiving Velcade and dexamethasone and have tolerated it well. The plan is to proceed with the treatment today as she have no objections. We will recheck her protein studies periodically to assess for response. It is too early at this time to fully estimate that. Her total protein is declining which is a good sign. Dr. Alen Blew has always given her the option to stop treatment and enrolling on hospice if she wishes.  2. Anemia: Related to plasma cell disorder. Her hemoglobin is  8.5 today however, she is asymptomatic. We will continue to monitor this closely and arrange packed red blood cell transfusion as needed.   3. Weight loss: Appetite is better since the start of Megace she continued to gain weight.  4. Thrombocytopenia: Related to Velcade no bleeding noted we'll continue to monitor for future counts.  5. Follow-up: Will be on a weekly basis for her injection. Clinical evaluation in 2 weeks.  A prescription for her weekly dexamethasone ( 20 mg) and prophylactic acyclovir (400 mg/day) was sent via [facility]  Scribe to [facility].   Carlton Adam, PA-C  7/22/20164:17 PM

## 2014-10-30 ENCOUNTER — Other Ambulatory Visit: Payer: Self-pay

## 2014-10-30 ENCOUNTER — Ambulatory Visit: Payer: Medicare Other | Admitting: Neurology

## 2014-10-30 ENCOUNTER — Encounter: Payer: Self-pay | Admitting: Neurology

## 2014-10-30 ENCOUNTER — Ambulatory Visit (INDEPENDENT_AMBULATORY_CARE_PROVIDER_SITE_OTHER): Payer: Medicare Other | Admitting: Neurology

## 2014-10-30 VITALS — BP 115/67 | HR 62 | Ht 62.0 in | Wt 135.2 lb

## 2014-10-30 DIAGNOSIS — I495 Sick sinus syndrome: Secondary | ICD-10-CM

## 2014-10-30 DIAGNOSIS — I480 Paroxysmal atrial fibrillation: Secondary | ICD-10-CM

## 2014-10-30 DIAGNOSIS — C9000 Multiple myeloma not having achieved remission: Secondary | ICD-10-CM | POA: Diagnosis not present

## 2014-10-30 DIAGNOSIS — G451 Carotid artery syndrome (hemispheric): Secondary | ICD-10-CM

## 2014-10-30 MED ORDER — DEXAMETHASONE 4 MG PO TABS: ORAL_TABLET | ORAL | Status: DC

## 2014-10-30 NOTE — Progress Notes (Signed)
STROKE NEUROLOGY FOLLOW UP NOTE  NAME: [name] DOB: May 01, 1931  REASON FOR VISIT: stroke follow up HISTORY FROM: pt and chart  Today we had the pleasure of seeing [name] in follow-up at our Neurology Clinic. Pt was accompanied by daughter.   History Summary [name] is a 82 y.o. female with history of COPD on home O2, CAD and previous MI s/p CABG in 2005, HTN, HLD, multiple myeloma on chemotherapy, anemia requiring blood transfusion was admitted on 09/09/14 for LLE weakness which was resolved quickly. MRI showed no stroke and MRA unremarkable. Due to questionable history of afib, embolic stroke work up was pursued but negative including TTE, CUS, LE venous doppler. On tele 02/2014, there is one episode suspicious for afib but cardiology did not think so. In addition, she was not anticoagulation candidate at that time due to anemia needing blood transfusion. LDL 63 and A1C 5.6. She was discharge on ASA.   Interval History During the interval time, the patient has been doing better. She started chemo therapy for MM, and repeat CBC showed improvement on anemia and platelet. However, she was admitted again on 10/09/14 for chest pain. She was found to have PAF with SSS. She was put on Xarelto $RemoveBe'15mg'YAnsJzhfw$  daily for stroke prevention as well as low dose beta block. No pacemaker implanted. Her recent Hb 8.5 and Platelet 159. Her Bp today 115/67.  REVIEW OF SYSTEMS: Full 14 system review of systems performed and notable only for those listed below and in HPI above, all others are negative:  Constitutional:   Cardiovascular:  Ear/Nose/Throat:   Skin:  Eyes:   Respiratory:   Gastroitestinal:   Genitourinary:  Hematology/Lymphatic:   Endocrine:  Musculoskeletal:   Allergy/Immunology:   Neurological:   Psychiatric:  Sleep: insomnia, sleepiness  The following represents the patient's updated allergies and side effects list: Allergies  Allergen Reactions  . Lexapro  [Escitalopram Oxalate] Other (See Comments)    Fatigue   . Soma [Carisoprodol] Other (See Comments)    Fatigue   . Wellbutrin [Bupropion] Other (See Comments)    "couldn't function and take it"  . Albuterol Sulfate Other (See Comments)    Albuterol HFA inhaler caused nervousness   . Codeine Hives  . Levaquin [Levofloxacin In D5w] Other (See Comments)    Unknown allergic reaction  . Plavix [Clopidogrel Bisulfate] Other (See Comments)    Unknown reaction  . Statins     The neurologically relevant items on the patient's problem list were reviewed on today's visit.  Neurologic Examination  A problem focused neurological exam (12 or more points of the single system neurologic examination, vital signs counts as 1 point, cranial nerves count for 8 points) was performed.  Blood pressure 115/67, pulse 62, height $RemoveBe'5\' 2"'CDeZhpDby$  (1.575 m), weight 135 lb 3.2 oz (61.326 kg).  General - Well nourished, well developed, in no apparent distress.  Ophthalmologic - Sharp disc margins OU.   Cardiovascular - Regular rate and rhythm with no murmur, no afib rhythm.  Mental Status -  Level of arousal and orientation to time, place, and person were intact. Language including expression, naming, repetition, comprehension was assessed and found intact.  Cranial Nerves II - XII - II - Visual field intact OU. III, IV, VI - Extraocular movements intact. V - Facial sensation intact bilaterally. VII - Facial movement intact bilaterally. VIII - Hearing & vestibular intact bilaterally. X - Palate elevates symmetrically. XI - Chin turning & shoulder shrug intact bilaterally.  XII - Tongue protrusion intact.  Motor Strength - The patient's strength was normal in all extremities and pronator drift was absent.  Bulk was normal and fasciculations were absent.   Motor Tone - Muscle tone was assessed at the neck and appendages and was normal.  Reflexes - The patient's reflexes were 1+ in all extremities and she had no  pathological reflexes.  Sensory - Light touch, temperature/pinprick were assessed and were normal.    Coordination - The patient had normal movements in the hands and feet with no ataxia or dysmetria.  Tremor was absent.  Gait and Station - The patient's transfers, posture, gait, station, and turns were observed as normal.  Data reviewed: I personally reviewed the images and agree with the radiology interpretations.  Ct Head Wo Contrast 09/10/2014  1. No acute intracranial process.  2. Generalized cerebral atrophy with mild chronic microvascular ischemic disease, similar to previous.   Mri & Mra Head 09/10/2014 Exam is motion degraded. No acute infarct. Small vessel disease type changes. Bone marrow changes suggestive of result of underlying anemia. Prominent intracranial atherosclerotic type changes.  2D echo  - Left ventricle: Posterior basal hypokinesis The cavity size wasnormal. Wall thickness was normal. Systolic function was normal.The estimated ejection fraction was in the range of 50% to 55%.Doppler parameters are consistent with both elevated ventricularend-diastolic filling pressure and elevated left atrial fillingpressure. - Left atrium: The atrium was mildly dilated. - Atrial septum: No defect or patent foramen ovale was identified. - Pulmonary arteries: PA peak pressure: 37 mm Hg (S).  Carotid Ultrasound Bilateral: 1-39% ICA stenosis. Vertebral artery flow is antegrade.   LE venous doppler No evidence of DVT, superficial thrombosis, or Baker's Cyst.  Component     Latest Ref Rng 09/10/2014  Cholesterol     0 - 200 mg/dL 126  Triglycerides     <150 mg/dL 213 (H)  HDL Cholesterol     >40 mg/dL 20 (L)  Total CHOL/HDL Ratio      6.3  VLDL     0 - 40 mg/dL 43 (H)  LDL (calc)     0 - 99 mg/dL 63  Hemoglobin A1C     4.8 - 5.6 % 5.6  Mean Plasma Glucose      114    Assessment: As you may recall, she is a 81 y.o. Caucasian female with PMH of COPD  on home O2, CAD and previous MI s/p CABG in 2005, HTN, HLD, multiple myeloma on chemotherapy, anemia requiring blood transfusion was admitted on 09/09/14 for right hemisphere TIA. MRI showed no stroke and MRA unremarkable. Stroke work up including TTE, CUS, LE venous doppler. LDL 63 and A1C 5.6. She was discharge on ASA. She was admitted again on 10/09/14 for chest pain but found to have PAF with SSS, Xarelto $RemoveBefo'15mg'WWaOGjBtxkz$  started. She also followed up with oncology for MM, chemo started and her anemia and thrombocytopenia improved. So far tolerating Xarelto well. Pacer not placed yet.  Plan:  - continue Xarelto for stroke prevention - follow up with cardiology, and oncology  - close monitor anemia and thrombocytopenia  - bleeding precautions - check BP at home - Follow up with your primary care physician for stroke risk factor modification. Recommend maintain blood pressure goal <130/80, diabetes with hemoglobin A1c goal below 6.5% and lipids with LDL cholesterol goal below 70 mg/dL.  - RTC in 6 months.  No orders of the defined types were placed in this encounter.    Meds ordered this  encounter  Medications  . prochlorperazine (COMPAZINE) 10 MG tablet    Sig:     Refill:  0  . COMBIVENT RESPIMAT 20-100 MCG/ACT AERS respimat    Sig:     Patient Instructions  - continue Xarelto for stroke prevention - follow up with cardiology, and oncology regularly.  - close monitor anemia and low platelet.  - bleeding precautions - check BP at home - Follow up with your primary care physician for stroke risk factor modification. Recommend maintain blood pressure goal <130/80, diabetes with hemoglobin A1c goal below 6.5% and lipids with LDL cholesterol goal below 70 mg/dL.  - follow up in 6 months.   Rosalin Hawking, MD PhD [facility] [address] [place], [place] [address] [phone]

## 2014-10-30 NOTE — Patient Instructions (Signed)
-   continue Xarelto for stroke prevention - follow up with cardiology, and oncology regularly.  - close monitor anemia and low platelet.  - bleeding precautions - check BP at home - Follow up with your primary care physician for stroke risk factor modification. Recommend maintain blood pressure goal <130/80, diabetes with hemoglobin A1c goal below 6.5% and lipids with LDL cholesterol goal below 70 mg/dL.  - follow up in 6 months.

## 2014-11-03 ENCOUNTER — Other Ambulatory Visit (HOSPITAL_BASED_OUTPATIENT_CLINIC_OR_DEPARTMENT_OTHER): Payer: Medicare Other

## 2014-11-03 ENCOUNTER — Ambulatory Visit (HOSPITAL_BASED_OUTPATIENT_CLINIC_OR_DEPARTMENT_OTHER): Payer: Medicare Other

## 2014-11-03 VITALS — BP 136/68 | HR 55 | Temp 98.4°F

## 2014-11-03 DIAGNOSIS — C9000 Multiple myeloma not having achieved remission: Secondary | ICD-10-CM

## 2014-11-03 DIAGNOSIS — Z5112 Encounter for antineoplastic immunotherapy: Secondary | ICD-10-CM | POA: Diagnosis not present

## 2014-11-03 LAB — CBC WITH DIFFERENTIAL/PLATELET
BASO%: 0.3 % (ref 0.0–2.0)
Basophils Absolute: 0 10*3/uL (ref 0.0–0.1)
EOS%: 0 % (ref 0.0–7.0)
Eosinophils Absolute: 0 10*3/uL (ref 0.0–0.5)
HEMATOCRIT: 26.2 % — AB (ref 34.8–46.6)
HGB: 8.7 g/dL — ABNORMAL LOW (ref 11.6–15.9)
LYMPH%: 53 % — ABNORMAL HIGH (ref 14.0–49.7)
MCH: 34.5 pg — AB (ref 25.1–34.0)
MCHC: 33.2 g/dL (ref 31.5–36.0)
MCV: 104 fL — AB (ref 79.5–101.0)
MONO#: 0.4 10*3/uL (ref 0.1–0.9)
MONO%: 12 % (ref 0.0–14.0)
NEUT#: 1.3 10*3/uL — ABNORMAL LOW (ref 1.5–6.5)
NEUT%: 34.7 % — ABNORMAL LOW (ref 38.4–76.8)
Platelets: 139 10*3/uL — ABNORMAL LOW (ref 145–400)
RBC: 2.52 10*6/uL — ABNORMAL LOW (ref 3.70–5.45)
RDW: 20.9 % — AB (ref 11.2–14.5)
WBC: 3.7 10*3/uL — AB (ref 3.9–10.3)
lymph#: 1.9 10*3/uL (ref 0.9–3.3)

## 2014-11-03 LAB — COMPREHENSIVE METABOLIC PANEL (CC13)
ALK PHOS: 49 U/L (ref 40–150)
ALT: 10 U/L (ref 0–55)
AST: 19 U/L (ref 5–34)
Albumin: 3.5 g/dL (ref 3.5–5.0)
Anion Gap: 7 mEq/L (ref 3–11)
BUN: 19.9 mg/dL (ref 7.0–26.0)
CALCIUM: 9.3 mg/dL (ref 8.4–10.4)
CO2: 22 meq/L (ref 22–29)
Chloride: 110 mEq/L — ABNORMAL HIGH (ref 98–109)
Creatinine: 1.2 mg/dL — ABNORMAL HIGH (ref 0.6–1.1)
EGFR: 44 mL/min/{1.73_m2} — AB (ref >90)
Glucose: 114 mg/dl (ref 70–140)
Potassium: 5.1 mEq/L (ref 3.5–5.1)
SODIUM: 139 meq/L (ref 136–145)
Total Bilirubin: 0.37 mg/dL (ref 0.20–1.20)
Total Protein: 8.4 g/dL — ABNORMAL HIGH (ref 6.4–8.3)

## 2014-11-03 MED ORDER — ONDANSETRON HCL 8 MG PO TABS
ORAL_TABLET | ORAL | Status: AC
  Filled 2014-11-03: qty 1

## 2014-11-03 MED ORDER — ONDANSETRON HCL 8 MG PO TABS
8.0000 mg | ORAL_TABLET | Freq: Once | ORAL | Status: AC
  Administered 2014-11-03: 8 mg via ORAL

## 2014-11-03 MED ORDER — BORTEZOMIB CHEMO SQ INJECTION 3.5 MG (2.5MG/ML)
1.3000 mg/m2 | Freq: Once | INTRAMUSCULAR | Status: AC
  Administered 2014-11-03: 2 mg via SUBCUTANEOUS
  Filled 2014-11-03: qty 2

## 2014-11-03 NOTE — Progress Notes (Signed)
Reviewed labs. Ok to treat with ANC 1.3 per Dr. Alen Blew

## 2014-11-03 NOTE — Patient Instructions (Signed)
[facility] Discharge Instructions for Patients Receiving Chemotherapy  Today you received the following chemotherapy agents: Velcade.  To help prevent nausea and vomiting after your treatment, we encourage you to take your nausea medication: Compazine 10 mg every 6 hours as needed.   If you develop nausea and vomiting that is not controlled by your nausea medication, call the clinic.   BELOW ARE SYMPTOMS THAT SHOULD BE REPORTED IMMEDIATELY:  *FEVER GREATER THAN 100.5 F  *CHILLS WITH OR WITHOUT FEVER  NAUSEA AND VOMITING THAT IS NOT CONTROLLED WITH YOUR NAUSEA MEDICATION  *UNUSUAL SHORTNESS OF BREATH  *UNUSUAL BRUISING OR BLEEDING  TENDERNESS IN MOUTH AND THROAT WITH OR WITHOUT PRESENCE OF ULCERS  *URINARY PROBLEMS  *BOWEL PROBLEMS  UNUSUAL RASH Items with * indicate a potential emergency and should be followed up as soon as possible.  Feel free to call the clinic you have any questions or concerns. The clinic phone number is [phone].  Please show the CHEMO ALERT CARD at check-in to the Emergency Department and triage nurse.   

## 2014-11-07 ENCOUNTER — Telehealth: Payer: Self-pay | Admitting: Interventional Cardiology

## 2014-11-07 LAB — SPEP & IFE WITH QIG
ALPHA-2-GLOBULIN: 0.8 g/dL (ref 0.5–0.9)
Abnormal Protein Band1: 2.1 g/dL
Albumin ELP: 4.2 g/dL (ref 3.8–4.8)
Alpha-1-Globulin: 0.3 g/dL (ref 0.2–0.3)
Beta 2: 0.2 g/dL (ref 0.2–0.5)
Beta Globulin: 0.4 g/dL (ref 0.4–0.6)
Gamma Globulin: 2.2 g/dL — ABNORMAL HIGH (ref 0.8–1.7)
IGA: 19 mg/dL — AB (ref 69–380)
IGM, SERUM: 15 mg/dL — AB (ref 52–322)
IgG (Immunoglobin G), Serum: 2520 mg/dL — ABNORMAL HIGH (ref 690–1700)
Total Protein, Serum Electrophoresis: 8.1 g/dL (ref 6.1–8.1)

## 2014-11-07 LAB — KAPPA/LAMBDA LIGHT CHAINS
KAPPA LAMBDA RATIO: 34 — AB (ref 0.26–1.65)
Kappa free light chain: 11.9 mg/dL — ABNORMAL HIGH (ref 0.33–1.94)
Lambda Free Lght Chn: 0.35 mg/dL — ABNORMAL LOW (ref 0.57–2.63)

## 2014-11-07 NOTE — Telephone Encounter (Signed)
New Message  RN from [facility] calling to see if pt needs to continue Xarelto RX (originally prescribed by a Dr. Emilio Math in Rehab) dosage: 15mg /day. Please call back and discuss.

## 2014-11-08 NOTE — Telephone Encounter (Signed)
Message from [facility] [facility] noted. Will await prior auth form for Xarelto.  Will supply samples to pt in the interim. Pt aware samples will be left at the front desk for pick up. She will have her daughter pick them up.

## 2014-11-08 NOTE — Telephone Encounter (Signed)
Returned [name] @[employer]  call. lmom Per Dr.Smith pt will need to continue Xarelto Dx PAF She is to call back if assistance is still needed

## 2014-11-08 NOTE — Telephone Encounter (Signed)
Follow up     Pt has enough xarelto to last until aug 15th.  Xarelto will require a prior authorization.  They faxed [place] a form. Please complete form or call them at [phone] for prior auth.

## 2014-11-09 ENCOUNTER — Telehealth: Payer: Self-pay

## 2014-11-09 NOTE — Telephone Encounter (Signed)
Form faxed to [facility] for prior approval of Xarelto 15mg .

## 2014-11-09 NOTE — Telephone Encounter (Signed)
Xarelto approved through [facility] for 1 year, till 0803/2017. [place] #[id number]. Pharmacy notified.

## 2014-11-09 NOTE — Telephone Encounter (Signed)
PA sent today to [facility]

## 2014-11-10 ENCOUNTER — Telehealth: Payer: Self-pay | Admitting: Oncology

## 2014-11-10 ENCOUNTER — Other Ambulatory Visit (HOSPITAL_BASED_OUTPATIENT_CLINIC_OR_DEPARTMENT_OTHER): Payer: Medicare Other

## 2014-11-10 ENCOUNTER — Other Ambulatory Visit: Payer: Medicare Other

## 2014-11-10 ENCOUNTER — Ambulatory Visit (HOSPITAL_BASED_OUTPATIENT_CLINIC_OR_DEPARTMENT_OTHER): Payer: Medicare Other

## 2014-11-10 ENCOUNTER — Ambulatory Visit (HOSPITAL_BASED_OUTPATIENT_CLINIC_OR_DEPARTMENT_OTHER): Payer: Medicare Other | Admitting: Oncology

## 2014-11-10 VITALS — BP 132/52 | HR 62 | Temp 98.1°F | Resp 18 | Ht 62.0 in | Wt 130.3 lb

## 2014-11-10 DIAGNOSIS — C9000 Multiple myeloma not having achieved remission: Secondary | ICD-10-CM

## 2014-11-10 DIAGNOSIS — D6959 Other secondary thrombocytopenia: Secondary | ICD-10-CM

## 2014-11-10 DIAGNOSIS — G8929 Other chronic pain: Secondary | ICD-10-CM | POA: Diagnosis not present

## 2014-11-10 DIAGNOSIS — Z5112 Encounter for antineoplastic immunotherapy: Secondary | ICD-10-CM | POA: Diagnosis not present

## 2014-11-10 DIAGNOSIS — D630 Anemia in neoplastic disease: Secondary | ICD-10-CM

## 2014-11-10 DIAGNOSIS — M545 Low back pain: Secondary | ICD-10-CM

## 2014-11-10 DIAGNOSIS — R634 Abnormal weight loss: Secondary | ICD-10-CM

## 2014-11-10 LAB — CBC WITH DIFFERENTIAL/PLATELET
BASO%: 0.4 % (ref 0.0–2.0)
Basophils Absolute: 0 10*3/uL (ref 0.0–0.1)
EOS ABS: 0 10*3/uL (ref 0.0–0.5)
EOS%: 0.2 % (ref 0.0–7.0)
HEMATOCRIT: 28.9 % — AB (ref 34.8–46.6)
HGB: 9.6 g/dL — ABNORMAL LOW (ref 11.6–15.9)
LYMPH#: 1 10*3/uL (ref 0.9–3.3)
LYMPH%: 22.4 % (ref 14.0–49.7)
MCH: 34.3 pg — AB (ref 25.1–34.0)
MCHC: 33.3 g/dL (ref 31.5–36.0)
MCV: 103 fL — ABNORMAL HIGH (ref 79.5–101.0)
MONO#: 0.2 10*3/uL (ref 0.1–0.9)
MONO%: 3.9 % (ref 0.0–14.0)
NEUT%: 73.1 % (ref 38.4–76.8)
NEUTROS ABS: 3.1 10*3/uL (ref 1.5–6.5)
Platelets: 138 10*3/uL — ABNORMAL LOW (ref 145–400)
RBC: 2.8 10*6/uL — ABNORMAL LOW (ref 3.70–5.45)
RDW: 21.3 % — ABNORMAL HIGH (ref 11.2–14.5)
WBC: 4.3 10*3/uL (ref 3.9–10.3)

## 2014-11-10 LAB — COMPREHENSIVE METABOLIC PANEL (CC13)
ALT: 11 U/L (ref 0–55)
AST: 17 U/L (ref 5–34)
Albumin: 3.8 g/dL (ref 3.5–5.0)
Alkaline Phosphatase: 50 U/L (ref 40–150)
Anion Gap: 5 mEq/L (ref 3–11)
BUN: 18 mg/dL (ref 7.0–26.0)
CO2: 24 mEq/L (ref 22–29)
Calcium: 9.5 mg/dL (ref 8.4–10.4)
Chloride: 111 mEq/L — ABNORMAL HIGH (ref 98–109)
Creatinine: 1.1 mg/dL (ref 0.6–1.1)
EGFR: 45 mL/min/{1.73_m2} — ABNORMAL LOW (ref >90)
GLUCOSE: 152 mg/dL — AB (ref 70–140)
Potassium: 4.6 mEq/L (ref 3.5–5.1)
Sodium: 140 mEq/L (ref 136–145)
Total Bilirubin: 0.6 mg/dL (ref 0.20–1.20)
Total Protein: 8.6 g/dL — ABNORMAL HIGH (ref 6.4–8.3)

## 2014-11-10 MED ORDER — BORTEZOMIB CHEMO SQ INJECTION 3.5 MG (2.5MG/ML)
1.3000 mg/m2 | Freq: Once | INTRAMUSCULAR | Status: AC
  Administered 2014-11-10: 2 mg via SUBCUTANEOUS
  Filled 2014-11-10: qty 2

## 2014-11-10 MED ORDER — ONDANSETRON HCL 8 MG PO TABS
ORAL_TABLET | ORAL | Status: AC
  Filled 2014-11-10: qty 1

## 2014-11-10 MED ORDER — ONDANSETRON HCL 8 MG PO TABS
8.0000 mg | ORAL_TABLET | Freq: Once | ORAL | Status: AC
  Administered 2014-11-10: 8 mg via ORAL

## 2014-11-10 MED ORDER — MEGESTROL ACETATE 400 MG/10ML PO SUSP: 400.0000 mg | Freq: Two times a day (BID) | ORAL | Status: DC

## 2014-11-10 NOTE — Patient Instructions (Signed)
[facility] Center Discharge Instructions for Patients Receiving Chemotherapy  Today you received the following chemotherapy agents velcade.  To help prevent nausea and vomiting after your treatment, we encourage you to take your nausea medication .   If you develop nausea and vomiting that is not controlled by your nausea medication, call the clinic.   BELOW ARE SYMPTOMS THAT SHOULD BE REPORTED IMMEDIATELY:  *FEVER GREATER THAN 100.5 F  *CHILLS WITH OR WITHOUT FEVER  NAUSEA AND VOMITING THAT IS NOT CONTROLLED WITH YOUR NAUSEA MEDICATION  *UNUSUAL SHORTNESS OF BREATH  *UNUSUAL BRUISING OR BLEEDING  TENDERNESS IN MOUTH AND THROAT WITH OR WITHOUT PRESENCE OF ULCERS  *URINARY PROBLEMS  *BOWEL PROBLEMS  UNUSUAL RASH Items with * indicate a potential emergency and should be followed up as soon as possible.  Feel free to call the clinic you have any questions or concerns. The clinic phone number is [phone].  Please show the CHEMO ALERT CARD at check-in to the Emergency Department and triage nurse.   

## 2014-11-10 NOTE — Progress Notes (Signed)
Hematology and Oncology Follow Up Visit  [name] [id number] 12/28/1931 82 y.o. 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 She was found to have IgG level of 3430 and an M spike of 2.9 g/dL on 07/20/2014. Bone marrow biopsy on 08/30/2014 showed 78% plasma cell dyscrasia.   Prior Therapy: Status post packed red cell transfusions during hospitalization in February 2016. This was repeated in May 2016.  She is status post a bone marrow biopsy on 08/30/2014.  Current therapy:  Systemic therapy with Velcade and dexamethasone weekly regimen starting on 09/22/2014. She is here for week 6 of therapy.   Interim History: [name] presents today for a follow-up visit accompanied by her daughter. Since the last visit, she reports no major complaints. She continues to tolerat  treatment without any complications. She does report slight decline in her appetite and lost 1 pound last few weeks. She has participated in her physical therapy and has gotten stronger. She still has some diffuse pain which is been chronic in nature. She still has some occasional exertional dyspnea and occasional diarrhea. She does not report any new complaints. She has not reported any bone pain, pathological fracture or neuropathy. She does not report any specific complications related to Velcade with dexamethasone. She does not report any peripheral neuropathy or abdominal pain. She has not reported any hyperactivity or dyspepsia. She is ready to proceed with chemotherapy.  She does not report any headaches, blurry vision, syncope or seizures. She does not report any fevers, chills, sweats. She does not report any chest pain, palpitation orthopnea. Does not report any cough, hemoptysis or hematemesis. She does report occasional nausea but no vomiting no hematochezia or melena. She does not report any frequency urgency or  hesitancy. She does report chronic back pain which is unchanged. Remaining review of systems unremarkable.   Medications: I have reviewed the patient's current medications.  Current Outpatient Prescriptions  Medication Sig Dispense Refill  . acetaminophen (TYLENOL) 325 MG tablet Take 2 tablets (650 mg total) by mouth every 6 (six) hours as needed for mild pain (or Fever >/= 101). 60 tablet 0  . acyclovir (ZOVIRAX) 400 MG tablet Take 1 tablet (400 mg total) by mouth daily. 30 tablet 3  . albuterol-ipratropium (COMBIVENT) 18-103 MCG/ACT inhaler Inhale 1-2 puffs into the lungs every 4 (four) hours as needed for wheezing or shortness of breath.    . cholecalciferol (VITAMIN D) 1000 UNITS tablet Take 1,000 Units by mouth every morning.     . COMBIVENT RESPIMAT 20-100 MCG/ACT AERS respimat     . dexamethasone (DECADRON) 4 MG tablet Take 5 tablets every week before chemotherapy injection. 40 tablet 3  . ezetimibe (ZETIA) 10 MG tablet Take 1 tablet (10 mg total) by mouth at bedtime.    . feeding supplement, ENSURE ENLIVE, (ENSURE ENLIVE) LIQD Take 237 mLs by mouth 3 (three) times daily between meals. 237 mL 12  . isosorbide mononitrate (IMDUR) 30 MG 24 hr tablet Take 1 tablet (30 mg total) by mouth daily. 30 tablet 0  . megestrol (MEGACE) 400 MG/10ML suspension Take 10 mLs (400 mg total) by mouth 2 (two) times daily. 240 mL 0  . metoprolol succinate (TOPROL-XL) 25 MG 24 hr tablet Take 1 tablet (25 mg total) by mouth daily. 30 tablet 0  . nitroGLYCERIN (NITROSTAT) 0.4 MG SL tablet Place 0.4 mg under the tongue every 5 (five) minutes  as needed for chest pain.    . Omega-3 Fatty Acids (FISH OIL) 1200 MG CAPS Take 1,200-2,400 mg by mouth 2 (two) times daily. Take 2 capsules (2400 mg) every morning and 1 capsule (1200 mg) every night    . pantoprazole (PROTONIX) 40 MG tablet Take 1 tablet (40 mg total) by mouth 2 (two) times daily.  0  . polyethylene glycol powder (GLYCOLAX/MIRALAX) powder Take 17 g by mouth  every morning. Mix with 8 oz liquid and drink  0  . prochlorperazine (COMPAZINE) 10 MG tablet   0  . RA SENNA 8.6 MG tablet Take 2 tablets by mouth at bedtime.   0  . Rivaroxaban (XARELTO) 15 MG TABS tablet Take 1 tablet (15 mg total) by mouth daily with supper. 30 tablet 0  . tiotropium (SPIRIVA) 18 MCG inhalation capsule Place 18 mcg into inhaler and inhale daily as needed (shortness of breath).      No current facility-administered medications for this visit.     Allergies:  Allergies  Allergen Reactions  . Lexapro [Escitalopram Oxalate] Other (See Comments)    Fatigue   . Soma [Carisoprodol] Other (See Comments)    Fatigue   . Wellbutrin [Bupropion] Other (See Comments)    "couldn't function and take it"  . Albuterol Sulfate Other (See Comments)    Albuterol HFA inhaler caused nervousness   . Codeine Hives  . Levaquin [Levofloxacin In D5w] Other (See Comments)    Unknown allergic reaction  . Plavix [Clopidogrel Bisulfate] Other (See Comments)    Unknown reaction  . Statins     Past Medical History, Surgical history, Social history, and Family History were reviewed and updated.   Physical Exam: Blood pressure 132/52, pulse 62, temperature 98.1 F (36.7 C), temperature source Oral, resp. rate 18, height $RemoveBe'5\' 2"'hxbKwwoBx$  (1.575 m), weight 130 lb 4.8 oz (59.104 kg), SpO2 97 %. ECOG: 1 General appearance: alert and cooperative. Elderly, frail woman without any distress. Head: Normocephalic, without obvious abnormality Neck: no adenopathy Lymph nodes: Cervical, supraclavicular, and axillary nodes normal. Heart:regular rate and rhythm, S1, S2 normal, no murmur, click, rub or gallop Lung:chest clear, no wheezing, rales, normal symmetric air entry. No dullness to percussion. Abdomin: soft, non-tender, without masses or organomegaly no shifting dullness or ascites. EXT:no erythema, induration, or nodules   Lab Results: Lab Results  Component Value Date   WBC 4.3 11/10/2014   HGB 9.6*  11/10/2014   HCT 28.9* 11/10/2014   MCV 103.0* 11/10/2014   PLT 138* 11/10/2014     Chemistry      Component Value Date/Time   NA 139 11/03/2014 1536   NA 137 10/13/2014 0433   K 5.1 11/03/2014 1536   K 4.3 10/13/2014 0433   CL 108 10/13/2014 0433   CO2 22 11/03/2014 1536   CO2 25 10/13/2014 0433   BUN 19.9 11/03/2014 1536   BUN 22* 10/13/2014 0433   CREATININE 1.2* 11/03/2014 1536   CREATININE 0.93 10/13/2014 0433      Component Value Date/Time   CALCIUM 9.3 11/03/2014 1536   CALCIUM 8.3* 10/13/2014 0433   ALKPHOS 49 11/03/2014 1536   ALKPHOS 48 10/09/2014 1524   AST 19 11/03/2014 1536   AST 25 10/09/2014 1524   ALT 10 11/03/2014 1536   ALT 21 10/09/2014 1524   BILITOT 0.37 11/03/2014 1536   BILITOT 0.5 10/09/2014 1524      Results for AYSIA, LOWDER (MRN [id number]) as of 11/10/2014 12:42  Ref. Range 09/22/2014 14:26 11/03/2014  15:36  IgG (Immunoglobin G), Serum Latest Ref Range: [phone] mg/dL 3650 (H) 2520 (H)     Impression and Plan:  81 year old woman with the following issues:  1. IgG kappa plasma cell disorder with 78% plasma cell infiltration in the bone marrow. She is quite symptomatic and certainly requires treatment. She meets the criteria of multiple myeloma given her anemia and overall symptomatology.  She is currently receiving Velcade and dexamethasone. Protein studies after close to 6 weeks of therapy showed decline in her IgG level from 3600-2500. She has tolerated this therapy relatively well. The plan is to proceed with the treatment today as she have no objections. She will complete total of 3 months of therapy before we repeat protein studies.  2. Anemia: Related to plasma cell disorder. Her hemoglobin is up to 9.6 and she is asymptomatic. She does not need any transfusions at this time.  3. Weight loss: Appetite is better since the start of Megace but for some reason has stopped taking it. I have encouraged her to resume that as well.  4.  Thrombocytopenia: Related to Velcade no bleeding noted we'll continue to monitor for future counts.  5. VZV prophylaxis: She continues to be on acyclovir.  6. Follow-up: Will be on a weekly basis for her injection. Clinical evaluation in 3 weeks.     Zola Button, MD 8/5/201612:55 PM

## 2014-11-10 NOTE — Telephone Encounter (Signed)
Gave adn printed appt sched and avs for pt for Aug adn Sept °

## 2014-11-17 ENCOUNTER — Other Ambulatory Visit (HOSPITAL_BASED_OUTPATIENT_CLINIC_OR_DEPARTMENT_OTHER): Payer: Medicare Other

## 2014-11-17 ENCOUNTER — Ambulatory Visit (HOSPITAL_BASED_OUTPATIENT_CLINIC_OR_DEPARTMENT_OTHER): Payer: Medicare Other

## 2014-11-17 VITALS — BP 138/73 | HR 79 | Temp 98.6°F

## 2014-11-17 DIAGNOSIS — C9000 Multiple myeloma not having achieved remission: Secondary | ICD-10-CM

## 2014-11-17 DIAGNOSIS — Z5112 Encounter for antineoplastic immunotherapy: Secondary | ICD-10-CM | POA: Diagnosis not present

## 2014-11-17 LAB — CBC WITH DIFFERENTIAL/PLATELET
BASO%: 0.4 % (ref 0.0–2.0)
Basophils Absolute: 0 10*3/uL (ref 0.0–0.1)
EOS ABS: 0 10*3/uL (ref 0.0–0.5)
EOS%: 0 % (ref 0.0–7.0)
HCT: 27.5 % — ABNORMAL LOW (ref 34.8–46.6)
HGB: 9.1 g/dL — ABNORMAL LOW (ref 11.6–15.9)
LYMPH%: 25.6 % (ref 14.0–49.7)
MCH: 34.3 pg — AB (ref 25.1–34.0)
MCHC: 33.3 g/dL (ref 31.5–36.0)
MCV: 103.1 fL — AB (ref 79.5–101.0)
MONO#: 0.1 10*3/uL (ref 0.1–0.9)
MONO%: 1.8 % (ref 0.0–14.0)
NEUT#: 2.7 10*3/uL (ref 1.5–6.5)
NEUT%: 72.2 % (ref 38.4–76.8)
Platelets: 138 10*3/uL — ABNORMAL LOW (ref 145–400)
RBC: 2.67 10*6/uL — ABNORMAL LOW (ref 3.70–5.45)
RDW: 20.1 % — AB (ref 11.2–14.5)
WBC: 3.8 10*3/uL — ABNORMAL LOW (ref 3.9–10.3)
lymph#: 1 10*3/uL (ref 0.9–3.3)

## 2014-11-17 LAB — COMPREHENSIVE METABOLIC PANEL (CC13)
ALT: 11 U/L (ref 0–55)
AST: 17 U/L (ref 5–34)
Albumin: 3.9 g/dL (ref 3.5–5.0)
Alkaline Phosphatase: 51 U/L (ref 40–150)
Anion Gap: 8 mEq/L (ref 3–11)
BUN: 23.3 mg/dL (ref 7.0–26.0)
CALCIUM: 9.1 mg/dL (ref 8.4–10.4)
CO2: 20 meq/L — AB (ref 22–29)
CREATININE: 1.2 mg/dL — AB (ref 0.6–1.1)
Chloride: 108 mEq/L (ref 98–109)
EGFR: 42 mL/min/{1.73_m2} — ABNORMAL LOW (ref >90)
Glucose: 156 mg/dl — ABNORMAL HIGH (ref 70–140)
Potassium: 4.4 mEq/L (ref 3.5–5.1)
SODIUM: 136 meq/L (ref 136–145)
TOTAL PROTEIN: 8.5 g/dL — AB (ref 6.4–8.3)
Total Bilirubin: 0.55 mg/dL (ref 0.20–1.20)

## 2014-11-17 MED ORDER — ONDANSETRON HCL 8 MG PO TABS
ORAL_TABLET | ORAL | Status: AC
  Filled 2014-11-17: qty 1

## 2014-11-17 MED ORDER — BORTEZOMIB CHEMO SQ INJECTION 3.5 MG (2.5MG/ML)
1.3000 mg/m2 | Freq: Once | INTRAMUSCULAR | Status: AC
  Administered 2014-11-17: 2 mg via SUBCUTANEOUS
  Filled 2014-11-17: qty 2

## 2014-11-17 MED ORDER — ONDANSETRON HCL 8 MG PO TABS
8.0000 mg | ORAL_TABLET | Freq: Once | ORAL | Status: AC
  Administered 2014-11-17: 8 mg via ORAL

## 2014-11-24 ENCOUNTER — Other Ambulatory Visit (HOSPITAL_BASED_OUTPATIENT_CLINIC_OR_DEPARTMENT_OTHER): Payer: Medicare Other

## 2014-11-24 ENCOUNTER — Ambulatory Visit (HOSPITAL_BASED_OUTPATIENT_CLINIC_OR_DEPARTMENT_OTHER): Payer: Medicare Other

## 2014-11-24 VITALS — BP 159/58 | HR 62 | Temp 97.2°F

## 2014-11-24 DIAGNOSIS — C9000 Multiple myeloma not having achieved remission: Secondary | ICD-10-CM

## 2014-11-24 DIAGNOSIS — Z5112 Encounter for antineoplastic immunotherapy: Secondary | ICD-10-CM | POA: Diagnosis not present

## 2014-11-24 LAB — CBC WITH DIFFERENTIAL/PLATELET
BASO%: 0.2 % (ref 0.0–2.0)
Basophils Absolute: 0 10*3/uL (ref 0.0–0.1)
EOS%: 0.1 % (ref 0.0–7.0)
Eosinophils Absolute: 0 10*3/uL (ref 0.0–0.5)
HEMATOCRIT: 28.6 % — AB (ref 34.8–46.6)
HGB: 9.7 g/dL — ABNORMAL LOW (ref 11.6–15.9)
LYMPH#: 1 10*3/uL (ref 0.9–3.3)
LYMPH%: 15.4 % (ref 14.0–49.7)
MCH: 34.9 pg — ABNORMAL HIGH (ref 25.1–34.0)
MCHC: 33.8 g/dL (ref 31.5–36.0)
MCV: 103.1 fL — ABNORMAL HIGH (ref 79.5–101.0)
MONO#: 0.1 10*3/uL (ref 0.1–0.9)
MONO%: 0.9 % (ref 0.0–14.0)
NEUT#: 5.2 10*3/uL (ref 1.5–6.5)
NEUT%: 83.4 % — ABNORMAL HIGH (ref 38.4–76.8)
Platelets: 160 10*3/uL (ref 145–400)
RBC: 2.78 10*6/uL — AB (ref 3.70–5.45)
RDW: 19.1 % — ABNORMAL HIGH (ref 11.2–14.5)
WBC: 6.2 10*3/uL (ref 3.9–10.3)

## 2014-11-24 LAB — COMPREHENSIVE METABOLIC PANEL (CC13)
ALT: 11 U/L (ref 0–55)
AST: 16 U/L (ref 5–34)
Albumin: 3.9 g/dL (ref 3.5–5.0)
Alkaline Phosphatase: 57 U/L (ref 40–150)
Anion Gap: 10 mEq/L (ref 3–11)
BUN: 18.9 mg/dL (ref 7.0–26.0)
CHLORIDE: 107 meq/L (ref 98–109)
CO2: 20 meq/L — AB (ref 22–29)
Calcium: 9.5 mg/dL (ref 8.4–10.4)
Creatinine: 1.3 mg/dL — ABNORMAL HIGH (ref 0.6–1.1)
EGFR: 39 mL/min/{1.73_m2} — ABNORMAL LOW (ref >90)
GLUCOSE: 198 mg/dL — AB (ref 70–140)
POTASSIUM: 4.6 meq/L (ref 3.5–5.1)
SODIUM: 136 meq/L (ref 136–145)
Total Bilirubin: 0.42 mg/dL (ref 0.20–1.20)
Total Protein: 8.5 g/dL — ABNORMAL HIGH (ref 6.4–8.3)

## 2014-11-24 MED ORDER — ONDANSETRON HCL 8 MG PO TABS
ORAL_TABLET | ORAL | Status: AC
Start: 2014-11-24 — End: 2014-11-24
  Filled 2014-11-24: qty 1

## 2014-11-24 MED ORDER — BORTEZOMIB CHEMO SQ INJECTION 3.5 MG (2.5MG/ML)
1.3000 mg/m2 | Freq: Once | INTRAMUSCULAR | Status: AC
  Administered 2014-11-24: 2 mg via SUBCUTANEOUS
  Filled 2014-11-24: qty 2

## 2014-11-24 MED ORDER — ONDANSETRON HCL 8 MG PO TABS
8.0000 mg | ORAL_TABLET | Freq: Once | ORAL | Status: AC
  Administered 2014-11-24: 8 mg via ORAL

## 2014-11-24 NOTE — Patient Instructions (Signed)
[facility] Discharge Instructions for Patients Receiving Chemotherapy  Today you received the following chemotherapy agents: velcade  To help prevent nausea and vomiting after your treatment, we encourage you to take your nausea medication as directed.   If you develop nausea and vomiting that is not controlled by your nausea medication, call the clinic.   BELOW ARE SYMPTOMS THAT SHOULD BE REPORTED IMMEDIATELY:  *FEVER GREATER THAN 100.5 F  *CHILLS WITH OR WITHOUT FEVER  NAUSEA AND VOMITING THAT IS NOT CONTROLLED WITH YOUR NAUSEA MEDICATION  *UNUSUAL SHORTNESS OF BREATH  *UNUSUAL BRUISING OR BLEEDING  TENDERNESS IN MOUTH AND THROAT WITH OR WITHOUT PRESENCE OF ULCERS  *URINARY PROBLEMS  *BOWEL PROBLEMS  UNUSUAL RASH Items with * indicate a potential emergency and should be followed up as soon as possible.  Feel free to call the clinic you have any questions or concerns. The clinic phone number is [phone].  Please show the CHEMO ALERT CARD at check-in to the Emergency Department and triage nurse.   

## 2014-12-01 ENCOUNTER — Telehealth: Payer: Self-pay | Admitting: Oncology

## 2014-12-01 ENCOUNTER — Ambulatory Visit (HOSPITAL_BASED_OUTPATIENT_CLINIC_OR_DEPARTMENT_OTHER): Payer: Medicare Other

## 2014-12-01 ENCOUNTER — Other Ambulatory Visit (HOSPITAL_BASED_OUTPATIENT_CLINIC_OR_DEPARTMENT_OTHER): Payer: Medicare Other

## 2014-12-01 ENCOUNTER — Ambulatory Visit (HOSPITAL_BASED_OUTPATIENT_CLINIC_OR_DEPARTMENT_OTHER): Payer: Medicare Other | Admitting: Oncology

## 2014-12-01 VITALS — BP 126/54 | HR 80 | Temp 98.1°F | Resp 17 | Ht 62.0 in | Wt 129.4 lb

## 2014-12-01 DIAGNOSIS — M545 Low back pain: Secondary | ICD-10-CM

## 2014-12-01 DIAGNOSIS — C9000 Multiple myeloma not having achieved remission: Secondary | ICD-10-CM

## 2014-12-01 DIAGNOSIS — R634 Abnormal weight loss: Secondary | ICD-10-CM

## 2014-12-01 DIAGNOSIS — D696 Thrombocytopenia, unspecified: Secondary | ICD-10-CM

## 2014-12-01 DIAGNOSIS — G8929 Other chronic pain: Secondary | ICD-10-CM | POA: Diagnosis not present

## 2014-12-01 DIAGNOSIS — Z5112 Encounter for antineoplastic immunotherapy: Secondary | ICD-10-CM

## 2014-12-01 DIAGNOSIS — D649 Anemia, unspecified: Secondary | ICD-10-CM | POA: Diagnosis not present

## 2014-12-01 LAB — CBC WITH DIFFERENTIAL/PLATELET
BASO%: 0.9 % (ref 0.0–2.0)
Basophils Absolute: 0 10e3/uL (ref 0.0–0.1)
EOS%: 0.3 % (ref 0.0–7.0)
Eosinophils Absolute: 0 10e3/uL (ref 0.0–0.5)
HCT: 28.5 % — ABNORMAL LOW (ref 34.8–46.6)
HGB: 9.6 g/dL — ABNORMAL LOW (ref 11.6–15.9)
LYMPH%: 39.6 % (ref 14.0–49.7)
MCH: 34.6 pg — ABNORMAL HIGH (ref 25.1–34.0)
MCHC: 33.5 g/dL (ref 31.5–36.0)
MCV: 103.2 fL — ABNORMAL HIGH (ref 79.5–101.0)
MONO#: 0.4 10e3/uL (ref 0.1–0.9)
MONO%: 9.2 % (ref 0.0–14.0)
NEUT#: 2.3 10e3/uL (ref 1.5–6.5)
NEUT%: 50 % (ref 38.4–76.8)
Platelets: 149 10e3/uL (ref 145–400)
RBC: 2.77 10e6/uL — ABNORMAL LOW (ref 3.70–5.45)
RDW: 18.6 % — ABNORMAL HIGH (ref 11.2–14.5)
WBC: 4.7 10e3/uL (ref 3.9–10.3)
lymph#: 1.9 10e3/uL (ref 0.9–3.3)

## 2014-12-01 LAB — COMPREHENSIVE METABOLIC PANEL (CC13)
ALBUMIN: 3.6 g/dL (ref 3.5–5.0)
ALK PHOS: 56 U/L (ref 40–150)
ALT: 9 U/L (ref 0–55)
ANION GAP: 7 meq/L (ref 3–11)
AST: 16 U/L (ref 5–34)
BILIRUBIN TOTAL: 0.46 mg/dL (ref 0.20–1.20)
BUN: 18.6 mg/dL (ref 7.0–26.0)
CO2: 25 mEq/L (ref 22–29)
Calcium: 9.2 mg/dL (ref 8.4–10.4)
Chloride: 106 mEq/L (ref 98–109)
Creatinine: 1.3 mg/dL — ABNORMAL HIGH (ref 0.6–1.1)
EGFR: 39 mL/min/{1.73_m2} — AB (ref >90)
Glucose: 104 mg/dl (ref 70–140)
Potassium: 4.3 mEq/L (ref 3.5–5.1)
Sodium: 138 mEq/L (ref 136–145)
Total Protein: 8.2 g/dL (ref 6.4–8.3)

## 2014-12-01 MED ORDER — BORTEZOMIB CHEMO SQ INJECTION 3.5 MG (2.5MG/ML)
1.3000 mg/m2 | Freq: Once | INTRAMUSCULAR | Status: AC
  Administered 2014-12-01: 2 mg via SUBCUTANEOUS
  Filled 2014-12-01: qty 2

## 2014-12-01 MED ORDER — ONDANSETRON HCL 8 MG PO TABS
8.0000 mg | ORAL_TABLET | Freq: Once | ORAL | Status: AC
  Administered 2014-12-01: 8 mg via ORAL

## 2014-12-01 MED ORDER — ONDANSETRON HCL 8 MG PO TABS
ORAL_TABLET | ORAL | Status: AC
  Filled 2014-12-01: qty 1

## 2014-12-01 NOTE — Progress Notes (Signed)
Hematology and Oncology Follow Up Visit  [name] [id number] May 17, 1931 81 y.o. 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 She was found to have IgG level of 3430 and an M spike of 2.9 g/dL on 07/20/2014. Bone marrow biopsy on 08/30/2014 showed 78% plasma cell dyscrasia.   Prior Therapy: Status post packed red cell transfusions during hospitalization in February 2016. This was repeated in May 2016.  She is status post a bone marrow biopsy on 08/30/2014.  Current therapy:  Systemic therapy with Velcade and dexamethasone weekly regimen starting on 09/22/2014.   Interim History: [name] presents today for a follow-up visit accompanied by her daughter. Since the last visit, she continues to recover and improve slowly.  She continues to tolerat  treatment without any complications. She does report slight decline in her appetite but her weight remained relatively stable close to 130 pounds. She does not report any peripheral neuropathy, constipation or abdominal pain. She does not report any injection-related complications associated with Velcade. She does not report any insomnia or anxiety associated with dexamethasone.  Her energy and performance status is slowly improving. She has not required any recent hospitalizations or transfusions.  She does not report any headaches, blurry vision, syncope or seizures. She does not report any fevers, chills, sweats. She does not report any chest pain, palpitation orthopnea. Does not report any cough, hemoptysis or hematemesis. She does report occasional nausea but no vomiting no hematochezia or melena. She does not report any frequency urgency or hesitancy. She does report chronic back pain which is unchanged. Remaining review of systems unremarkable.   Medications: I have reviewed the patient's current medications.  Current Outpatient  Prescriptions  Medication Sig Dispense Refill  . acetaminophen (TYLENOL) 325 MG tablet Take 2 tablets (650 mg total) by mouth every 6 (six) hours as needed for mild pain (or Fever >/= 101). 60 tablet 0  . acyclovir (ZOVIRAX) 400 MG tablet Take 1 tablet (400 mg total) by mouth daily. 30 tablet 3  . albuterol-ipratropium (COMBIVENT) 18-103 MCG/ACT inhaler Inhale 1-2 puffs into the lungs every 4 (four) hours as needed for wheezing or shortness of breath.    . cholecalciferol (VITAMIN D) 1000 UNITS tablet Take 1,000 Units by mouth every morning.     . COMBIVENT RESPIMAT 20-100 MCG/ACT AERS respimat     . dexamethasone (DECADRON) 4 MG tablet Take 5 tablets every week before chemotherapy injection. 40 tablet 3  . ezetimibe (ZETIA) 10 MG tablet Take 1 tablet (10 mg total) by mouth at bedtime.    . feeding supplement, ENSURE ENLIVE, (ENSURE ENLIVE) LIQD Take 237 mLs by mouth 3 (three) times daily between meals. 237 mL 12  . isosorbide mononitrate (IMDUR) 30 MG 24 hr tablet Take 1 tablet (30 mg total) by mouth daily. 30 tablet 0  . megestrol (MEGACE) 400 MG/10ML suspension Take 10 mLs (400 mg total) by mouth 2 (two) times daily. 240 mL 0  . metoprolol succinate (TOPROL-XL) 25 MG 24 hr tablet Take 1 tablet (25 mg total) by mouth daily. 30 tablet 0  . nitroGLYCERIN (NITROSTAT) 0.4 MG SL tablet Place 0.4 mg under the tongue every 5 (five) minutes as needed for chest pain.    . Omega-3 Fatty Acids (FISH OIL) 1200 MG CAPS Take 1,200-2,400 mg by mouth 2 (two) times daily. Take 2 capsules (2400 mg) every morning and 1 capsule (1200 mg) every night    .  pantoprazole (PROTONIX) 40 MG tablet Take 1 tablet (40 mg total) by mouth 2 (two) times daily.  0  . polyethylene glycol powder (GLYCOLAX/MIRALAX) powder Take 17 g by mouth every morning. Mix with 8 oz liquid and drink  0  . prochlorperazine (COMPAZINE) 10 MG tablet   0  . RA SENNA 8.6 MG tablet Take 2 tablets by mouth at bedtime.   0  . Rivaroxaban (XARELTO) 15 MG  TABS tablet Take 1 tablet (15 mg total) by mouth daily with supper. 30 tablet 0  . tiotropium (SPIRIVA) 18 MCG inhalation capsule Place 18 mcg into inhaler and inhale daily as needed (shortness of breath).      No current facility-administered medications for this visit.     Allergies:  Allergies  Allergen Reactions  . Lexapro [Escitalopram Oxalate] Other (See Comments)    Fatigue   . Soma [Carisoprodol] Other (See Comments)    Fatigue   . Wellbutrin [Bupropion] Other (See Comments)    "couldn't function and take it"  . Albuterol Sulfate Other (See Comments)    Albuterol HFA inhaler caused nervousness   . Codeine Hives  . Levaquin [Levofloxacin In D5w] Other (See Comments)    Unknown allergic reaction  . Plavix [Clopidogrel Bisulfate] Other (See Comments)    Unknown reaction  . Statins     Past Medical History, Surgical history, Social history, and Family History were reviewed and updated.   Physical Exam: Blood pressure 126/54, pulse 80, temperature 98.1 F (36.7 C), temperature source Oral, resp. rate 17, height $RemoveBe'5\' 2"'[id number]$  (1.575 m), weight 129 lb 6.4 oz (58.695 kg), SpO2 98 %. ECOG: 1 General appearance: alert and cooperative. Not in any distress. Head: Normocephalic, without obvious abnormality Neck: no adenopathy Lymph nodes: Cervical, supraclavicular, and axillary nodes normal. Heart:regular rate and rhythm, S1, S2 normal, no murmur, click, rub or gallop Lung:chest clear, no wheezing, rales, normal symmetric air entry.  Abdomin: soft, non-tender, without masses or organomegaly  EXT:no erythema, induration, or nodules   Lab Results: Lab Results  Component Value Date   WBC 4.7 12/01/2014   HGB 9.6* 12/01/2014   HCT 28.5* 12/01/2014   MCV 103.2* 12/01/2014   PLT 149 12/01/2014     Chemistry      Component Value Date/Time   NA 136 11/24/2014 1524   NA 137 10/13/2014 0433   K 4.6 11/24/2014 1524   K 4.3 10/13/2014 0433   CL 108 10/13/2014 0433   CO2 20*  11/24/2014 1524   CO2 25 10/13/2014 0433   BUN 18.9 11/24/2014 1524   BUN 22* 10/13/2014 0433   CREATININE 1.3* 11/24/2014 1524   CREATININE 0.93 10/13/2014 0433      Component Value Date/Time   CALCIUM 9.5 11/24/2014 1524   CALCIUM 8.3* 10/13/2014 0433   ALKPHOS 57 11/24/2014 1524   ALKPHOS 48 10/09/2014 1524   AST 16 11/24/2014 1524   AST 25 10/09/2014 1524   ALT 11 11/24/2014 1524   ALT 21 10/09/2014 1524   BILITOT 0.42 11/24/2014 1524   BILITOT 0.5 10/09/2014 1524      Results for EVETTA, RENNER (MRN [id number]) as of 11/10/2014 12:42  Ref. Range 09/22/2014 14:26 11/03/2014 15:36  IgG (Immunoglobin G), Serum Latest Ref Range: [phone] mg/dL 3650 (H) 2520 (H)     Impression and Plan:  81 year old woman with the following issues:  1. IgG kappa plasma cell disorder with 78% plasma cell infiltration in the bone marrow. She is quite symptomatic and certainly requires treatment.  She meets the criteria of multiple myeloma given her anemia and overall symptomatology.  She is currently receiving Velcade and dexamethasone. Protein studies after close to 6 weeks of therapy showed decline in her IgG level from 3600-2500. She has tolerated this therapy relatively well.   I see no reason to change the plan for the time being and we'll continue to monitor her protein studies. I anticipate she will require 4-6 months of therapy.  2. Anemia: Related to plasma cell disorder. Her hemoglobin is up to 9.6 and does not need any transfusions at this time.  3. Weight loss: Appetite is better with Megace therapy. Continue to encourage her to maintain her nutrition.  4. Thrombocytopenia: Related to Velcade no bleeding noted. Her platelet count has normalized at this time.  5. VZV prophylaxis: She continues to be on acyclovir.  6. Follow-up: Will be on a weekly basis for her injection. Clinical evaluation in 4 weeks.     Zola Button, MD 8/26/20161:15 PM

## 2014-12-01 NOTE — Telephone Encounter (Signed)
Gave adn printed appt sched and avs for pt for Sept °

## 2014-12-08 ENCOUNTER — Ambulatory Visit (HOSPITAL_BASED_OUTPATIENT_CLINIC_OR_DEPARTMENT_OTHER): Payer: Medicare Other

## 2014-12-08 ENCOUNTER — Other Ambulatory Visit (HOSPITAL_BASED_OUTPATIENT_CLINIC_OR_DEPARTMENT_OTHER): Payer: Medicare Other

## 2014-12-08 VITALS — BP 146/70 | HR 76 | Temp 97.0°F | Resp 19

## 2014-12-08 DIAGNOSIS — Z5112 Encounter for antineoplastic immunotherapy: Secondary | ICD-10-CM

## 2014-12-08 DIAGNOSIS — C9000 Multiple myeloma not having achieved remission: Secondary | ICD-10-CM

## 2014-12-08 LAB — CBC WITH DIFFERENTIAL/PLATELET
BASO%: 0.3 % (ref 0.0–2.0)
Basophils Absolute: 0 10*3/uL (ref 0.0–0.1)
EOS%: 0.1 % (ref 0.0–7.0)
Eosinophils Absolute: 0 10*3/uL (ref 0.0–0.5)
HEMATOCRIT: 30.2 % — AB (ref 34.8–46.6)
HGB: 10.4 g/dL — ABNORMAL LOW (ref 11.6–15.9)
LYMPH#: 0.9 10*3/uL (ref 0.9–3.3)
LYMPH%: 16.6 % (ref 14.0–49.7)
MCH: 35.6 pg — ABNORMAL HIGH (ref 25.1–34.0)
MCHC: 34.3 g/dL (ref 31.5–36.0)
MCV: 103.6 fL — ABNORMAL HIGH (ref 79.5–101.0)
MONO#: 0.1 10*3/uL (ref 0.1–0.9)
MONO%: 1.1 % (ref 0.0–14.0)
NEUT%: 81.9 % — AB (ref 38.4–76.8)
NEUTROS ABS: 4.4 10*3/uL (ref 1.5–6.5)
PLATELETS: 152 10*3/uL (ref 145–400)
RBC: 2.91 10*6/uL — ABNORMAL LOW (ref 3.70–5.45)
RDW: 17.7 % — ABNORMAL HIGH (ref 11.2–14.5)
WBC: 5.4 10*3/uL (ref 3.9–10.3)

## 2014-12-08 LAB — COMPREHENSIVE METABOLIC PANEL (CC13)
ALBUMIN: 3.6 g/dL (ref 3.5–5.0)
ALK PHOS: 62 U/L (ref 40–150)
ALT: 13 U/L (ref 0–55)
AST: 18 U/L (ref 5–34)
Anion Gap: 10 mEq/L (ref 3–11)
BUN: 15.5 mg/dL (ref 7.0–26.0)
CO2: 22 meq/L (ref 22–29)
Calcium: 9.9 mg/dL (ref 8.4–10.4)
Chloride: 106 mEq/L (ref 98–109)
Creatinine: 1.1 mg/dL (ref 0.6–1.1)
EGFR: 45 mL/min/{1.73_m2} — AB (ref >90)
GLUCOSE: 213 mg/dL — AB (ref 70–140)
POTASSIUM: 4.8 meq/L (ref 3.5–5.1)
SODIUM: 137 meq/L (ref 136–145)
Total Bilirubin: 0.36 mg/dL (ref 0.20–1.20)
Total Protein: 8.4 g/dL — ABNORMAL HIGH (ref 6.4–8.3)

## 2014-12-08 MED ORDER — ONDANSETRON HCL 8 MG PO TABS
8.0000 mg | ORAL_TABLET | Freq: Once | ORAL | Status: AC
  Administered 2014-12-08: 8 mg via ORAL

## 2014-12-08 MED ORDER — BORTEZOMIB CHEMO SQ INJECTION 3.5 MG (2.5MG/ML)
1.3000 mg/m2 | Freq: Once | INTRAMUSCULAR | Status: AC
  Administered 2014-12-08: 2 mg via SUBCUTANEOUS
  Filled 2014-12-08: qty 2

## 2014-12-08 MED ORDER — ONDANSETRON HCL 8 MG PO TABS
ORAL_TABLET | ORAL | Status: AC
  Filled 2014-12-08: qty 1

## 2014-12-08 NOTE — Patient Instructions (Signed)
Bortezomib injection What is this medicine? BORTEZOMIB (bor TEZ oh mib) is a chemotherapy drug. It slows the growth of cancer cells. This medicine is used to treat multiple myeloma, and certain lymphomas, such as mantle-cell lymphoma. This medicine may be used for other purposes; ask your health care provider or pharmacist if you have questions. COMMON BRAND NAME(S): Velcade What should I tell my health care provider before I take this medicine? They need to know if you have any of these conditions: -diabetes -heart disease -irregular heartbeat -liver disease -on hemodialysis -low blood counts, like low white blood cells, platelets, or hemoglobin -peripheral neuropathy -taking medicine for blood pressure -an unusual or allergic reaction to bortezomib, mannitol, boron, other medicines, foods, dyes, or preservatives -pregnant or trying to get pregnant -breast-feeding How should I use this medicine? This medicine is for injection into a vein or for injection under the skin. It is given by a health care professional in a hospital or clinic setting. Talk to your pediatrician regarding the use of this medicine in children. Special care may be needed. Overdosage: If you think you have taken too much of this medicine contact a poison control center or emergency room at once. NOTE: This medicine is only for you. Do not share this medicine with others. What if I miss a dose? It is important not to miss your dose. Call your doctor or health care professional if you are unable to keep an appointment. What may interact with this medicine? This medicine may interact with the following medications: -ketoconazole -rifampin -ritonavir -St. John's Wort This list may not describe all possible interactions. Give your health care provider a list of all the medicines, herbs, non-prescription drugs, or dietary supplements you use. Also tell them if you smoke, drink alcohol, or use illegal drugs. Some items  may interact with your medicine. What should I watch for while using this medicine? Visit your doctor for checks on your progress. This drug may make you feel generally unwell. This is not uncommon, as chemotherapy can affect healthy cells as well as cancer cells. Report any side effects. Continue your course of treatment even though you feel ill unless your doctor tells you to stop. You may get drowsy or dizzy. Do not drive, use machinery, or do anything that needs mental alertness until you know how this medicine affects you. Do not stand or sit up quickly, especially if you are an older patient. This reduces the risk of dizzy or fainting spells. In some cases, you may be given additional medicines to help with side effects. Follow all directions for their use. Call your doctor or health care professional for advice if you get a fever, chills or sore throat, or other symptoms of a cold or flu. Do not treat yourself. This drug decreases your body's ability to fight infections. Try to avoid being around people who are sick. This medicine may increase your risk to bruise or bleed. Call your doctor or health care professional if you notice any unusual bleeding. You may need blood work done while you are taking this medicine. In some patients, this medicine may cause a serious brain infection that may cause death. If you have any problems seeing, thinking, speaking, walking, or standing, tell your doctor right away. If you cannot reach your doctor, urgently seek other source of medical care. Do not become pregnant while taking this medicine. Women should inform their doctor if they wish to become pregnant or think they might be pregnant. There is   a potential for serious side effects to an unborn child. Talk to your health care professional or pharmacist for more information. Do not breast-feed an infant while taking this medicine. Check with your doctor or health care professional if you get an attack of  severe diarrhea, nausea and vomiting, or if you sweat a lot. The loss of too much body fluid can make it dangerous for you to take this medicine. What side effects may I notice from receiving this medicine? Side effects that you should report to your doctor or health care professional as soon as possible: -allergic reactions like skin rash, itching or hives, swelling of the face, lips, or tongue -breathing problems -changes in hearing -changes in vision -fast, irregular heartbeat -feeling faint or lightheaded, falls -pain, tingling, numbness in the hands or feet -right upper belly pain -seizures -swelling of the ankles, feet, hands -unusual bleeding or bruising -unusually weak or tired -vomiting -yellowing of the eyes or skin Side effects that usually do not require medical attention (report to your doctor or health care professional if they continue or are bothersome): -changes in emotions or moods -constipation -diarrhea -loss of appetite -headache -irritation at site where injected -nausea This list may not describe all possible side effects. Call your doctor for medical advice about side effects. You may report side effects to FDA at 1-800-FDA-1088. Where should I keep my medicine? This drug is given in a hospital or clinic and will not be stored at home. NOTE: This sheet is a summary. It may not cover all possible information. If you have questions about this medicine, talk to your doctor, pharmacist, or health care provider.  2015, [employer]. (2013-01-17 12:46:32)  

## 2014-12-15 ENCOUNTER — Other Ambulatory Visit (HOSPITAL_BASED_OUTPATIENT_CLINIC_OR_DEPARTMENT_OTHER): Payer: Medicare Other

## 2014-12-15 ENCOUNTER — Ambulatory Visit (HOSPITAL_BASED_OUTPATIENT_CLINIC_OR_DEPARTMENT_OTHER): Payer: Medicare Other

## 2014-12-15 VITALS — BP 167/56 | HR 56 | Temp 97.0°F

## 2014-12-15 DIAGNOSIS — C9000 Multiple myeloma not having achieved remission: Secondary | ICD-10-CM | POA: Diagnosis not present

## 2014-12-15 DIAGNOSIS — Z5112 Encounter for antineoplastic immunotherapy: Secondary | ICD-10-CM

## 2014-12-15 LAB — CBC WITH DIFFERENTIAL/PLATELET
BASO%: 0.2 % (ref 0.0–2.0)
BASOS ABS: 0 10*3/uL (ref 0.0–0.1)
EOS%: 0.2 % (ref 0.0–7.0)
Eosinophils Absolute: 0 10*3/uL (ref 0.0–0.5)
HEMATOCRIT: 30.4 % — AB (ref 34.8–46.6)
HGB: 10.2 g/dL — ABNORMAL LOW (ref 11.6–15.9)
LYMPH#: 0.8 10*3/uL — AB (ref 0.9–3.3)
LYMPH%: 19.4 % (ref 14.0–49.7)
MCH: 34.7 pg — AB (ref 25.1–34.0)
MCHC: 33.7 g/dL (ref 31.5–36.0)
MCV: 102.9 fL — ABNORMAL HIGH (ref 79.5–101.0)
MONO#: 0.1 10*3/uL (ref 0.1–0.9)
MONO%: 2.4 % (ref 0.0–14.0)
NEUT#: 3.4 10*3/uL (ref 1.5–6.5)
NEUT%: 77.8 % — AB (ref 38.4–76.8)
PLATELETS: 148 10*3/uL (ref 145–400)
RBC: 2.95 10*6/uL — ABNORMAL LOW (ref 3.70–5.45)
RDW: 16.8 % — ABNORMAL HIGH (ref 11.2–14.5)
WBC: 4.3 10*3/uL (ref 3.9–10.3)

## 2014-12-15 LAB — COMPREHENSIVE METABOLIC PANEL (CC13)
ALBUMIN: 3.7 g/dL (ref 3.5–5.0)
ALK PHOS: 70 U/L (ref 40–150)
ALT: 10 U/L (ref 0–55)
AST: 17 U/L (ref 5–34)
Anion Gap: 8 mEq/L (ref 3–11)
BILIRUBIN TOTAL: 0.56 mg/dL (ref 0.20–1.20)
BUN: 14.5 mg/dL (ref 7.0–26.0)
CO2: 25 mEq/L (ref 22–29)
CREATININE: 1.1 mg/dL (ref 0.6–1.1)
Calcium: 9.4 mg/dL (ref 8.4–10.4)
Chloride: 106 mEq/L (ref 98–109)
EGFR: 46 mL/min/{1.73_m2} — ABNORMAL LOW (ref >90)
GLUCOSE: 158 mg/dL — AB (ref 70–140)
POTASSIUM: 4.2 meq/L (ref 3.5–5.1)
SODIUM: 139 meq/L (ref 136–145)
TOTAL PROTEIN: 8.5 g/dL — AB (ref 6.4–8.3)

## 2014-12-15 MED ORDER — ONDANSETRON HCL 8 MG PO TABS
8.0000 mg | ORAL_TABLET | Freq: Once | ORAL | Status: AC
  Administered 2014-12-15: 8 mg via ORAL

## 2014-12-15 MED ORDER — ONDANSETRON HCL 8 MG PO TABS
ORAL_TABLET | ORAL | Status: AC
  Filled 2014-12-15: qty 1

## 2014-12-15 MED ORDER — BORTEZOMIB CHEMO SQ INJECTION 3.5 MG (2.5MG/ML)
1.3000 mg/m2 | Freq: Once | INTRAMUSCULAR | Status: AC
  Administered 2014-12-15: 2 mg via SUBCUTANEOUS
  Filled 2014-12-15: qty 2

## 2014-12-15 NOTE — Patient Instructions (Signed)
[facility] Discharge Instructions for Patients Receiving Chemotherapy  Today you received the following chemotherapy agents: Velcade.  Continue the Dexamethasone 5 tablets once weekly.  Continue daily Acyclovir for shingles prevention.  To help prevent nausea and vomiting after your treatment, we encourage you to take your nausea medication: Compazine. Take one every 6 hours as needed.   If you develop nausea and vomiting that is not controlled by your nausea medication, call the clinic.   BELOW ARE SYMPTOMS THAT SHOULD BE REPORTED IMMEDIATELY:  *FEVER GREATER THAN 100.5 F  *CHILLS WITH OR WITHOUT FEVER  NAUSEA AND VOMITING THAT IS NOT CONTROLLED WITH YOUR NAUSEA MEDICATION  *UNUSUAL SHORTNESS OF BREATH  *UNUSUAL BRUISING OR BLEEDING  TENDERNESS IN MOUTH AND THROAT WITH OR WITHOUT PRESENCE OF ULCERS  *URINARY PROBLEMS  *BOWEL PROBLEMS  UNUSUAL RASH Items with * indicate a potential emergency and should be followed up as soon as possible.  Feel free to call the clinic should you have any questions or concerns. The clinic phone number is [phone].  Please show the [place] at check-in to the Emergency Department and triage nurse.

## 2014-12-15 NOTE — Progress Notes (Signed)
Pt reports she is having difficulty getting medications refilled. Reviewed all oncology scripts, due refills in Oct. It seems Xarelto was originally ordered by hospitalist. Instructed pt to contact her cardiologist for Xarelto refill. Med list reviewed. Pt will schedule appointment with PCP to review all other meds and further medication instructions.

## 2014-12-22 ENCOUNTER — Other Ambulatory Visit (HOSPITAL_BASED_OUTPATIENT_CLINIC_OR_DEPARTMENT_OTHER): Payer: Medicare Other

## 2014-12-22 ENCOUNTER — Ambulatory Visit (HOSPITAL_BASED_OUTPATIENT_CLINIC_OR_DEPARTMENT_OTHER): Payer: Medicare Other

## 2014-12-22 VITALS — BP 173/89 | HR 60 | Temp 97.7°F | Resp 16

## 2014-12-22 DIAGNOSIS — C9000 Multiple myeloma not having achieved remission: Secondary | ICD-10-CM | POA: Diagnosis not present

## 2014-12-22 DIAGNOSIS — Z5112 Encounter for antineoplastic immunotherapy: Secondary | ICD-10-CM | POA: Diagnosis not present

## 2014-12-22 LAB — CBC WITH DIFFERENTIAL/PLATELET
BASO%: 0.4 % (ref 0.0–2.0)
Basophils Absolute: 0 10*3/uL (ref 0.0–0.1)
EOS%: 0.2 % (ref 0.0–7.0)
Eosinophils Absolute: 0 10*3/uL (ref 0.0–0.5)
HCT: 31.6 % — ABNORMAL LOW (ref 34.8–46.6)
HGB: 10.6 g/dL — ABNORMAL LOW (ref 11.6–15.9)
LYMPH%: 17.6 % (ref 14.0–49.7)
MCH: 34.3 pg — ABNORMAL HIGH (ref 25.1–34.0)
MCHC: 33.5 g/dL (ref 31.5–36.0)
MCV: 102.2 fL — ABNORMAL HIGH (ref 79.5–101.0)
MONO#: 0.1 10*3/uL (ref 0.1–0.9)
MONO%: 1.6 % (ref 0.0–14.0)
NEUT%: 80.2 % — ABNORMAL HIGH (ref 38.4–76.8)
NEUTROS ABS: 4.3 10*3/uL (ref 1.5–6.5)
Platelets: 152 10*3/uL (ref 145–400)
RBC: 3.09 10*6/uL — AB (ref 3.70–5.45)
RDW: 16.1 % — AB (ref 11.2–14.5)
WBC: 5.4 10*3/uL (ref 3.9–10.3)
lymph#: 0.9 10*3/uL (ref 0.9–3.3)

## 2014-12-22 LAB — COMPREHENSIVE METABOLIC PANEL (CC13)
ALBUMIN: 3.8 g/dL (ref 3.5–5.0)
ALK PHOS: 69 U/L (ref 40–150)
ALT: 12 U/L (ref 0–55)
ANION GAP: 7 meq/L (ref 3–11)
AST: 17 U/L (ref 5–34)
BUN: 21.1 mg/dL (ref 7.0–26.0)
CALCIUM: 9.3 mg/dL (ref 8.4–10.4)
CO2: 26 mEq/L (ref 22–29)
CREATININE: 1 mg/dL (ref 0.6–1.1)
Chloride: 106 mEq/L (ref 98–109)
EGFR: 51 mL/min/{1.73_m2} — ABNORMAL LOW (ref >90)
Glucose: 124 mg/dl (ref 70–140)
POTASSIUM: 4.4 meq/L (ref 3.5–5.1)
Sodium: 139 mEq/L (ref 136–145)
Total Bilirubin: 0.41 mg/dL (ref 0.20–1.20)
Total Protein: 8.5 g/dL — ABNORMAL HIGH (ref 6.4–8.3)

## 2014-12-22 MED ORDER — ONDANSETRON HCL 8 MG PO TABS
ORAL_TABLET | ORAL | Status: AC
  Filled 2014-12-22: qty 1

## 2014-12-22 MED ORDER — BORTEZOMIB CHEMO SQ INJECTION 3.5 MG (2.5MG/ML)
1.3000 mg/m2 | Freq: Once | INTRAMUSCULAR | Status: AC
  Administered 2014-12-22: 2 mg via SUBCUTANEOUS
  Filled 2014-12-22: qty 2

## 2014-12-22 MED ORDER — ONDANSETRON HCL 8 MG PO TABS
8.0000 mg | ORAL_TABLET | Freq: Once | ORAL | Status: AC
  Administered 2014-12-22: 8 mg via ORAL

## 2014-12-22 NOTE — Patient Instructions (Signed)
[facility] Discharge Instructions for Patients Receiving Chemotherapy  Today you received the following chemotherapy agents velcade.  To help prevent nausea and vomiting after your treatment, we encourage you to take your nausea medication .   If you develop nausea and vomiting that is not controlled by your nausea medication, call the clinic.   BELOW ARE SYMPTOMS THAT SHOULD BE REPORTED IMMEDIATELY:  *FEVER GREATER THAN 100.5 F  *CHILLS WITH OR WITHOUT FEVER  NAUSEA AND VOMITING THAT IS NOT CONTROLLED WITH YOUR NAUSEA MEDICATION  *UNUSUAL SHORTNESS OF BREATH  *UNUSUAL BRUISING OR BLEEDING  TENDERNESS IN MOUTH AND THROAT WITH OR WITHOUT PRESENCE OF ULCERS  *URINARY PROBLEMS  *BOWEL PROBLEMS  UNUSUAL RASH Items with * indicate a potential emergency and should be followed up as soon as possible.  Feel free to call the clinic you have any questions or concerns. The clinic phone number is [phone].  Please show the CHEMO ALERT CARD at check-in to the Emergency Department and triage nurse.   

## 2014-12-26 ENCOUNTER — Emergency Department (HOSPITAL_COMMUNITY): Payer: Medicare Other

## 2014-12-26 ENCOUNTER — Observation Stay (HOSPITAL_COMMUNITY)
Admission: EM | Admit: 2014-12-26 | Discharge: 2014-12-27 | Disposition: A | Discharge status: Home / Self Care | Payer: Medicare Other | Attending: Internal Medicine | Admitting: Internal Medicine

## 2014-12-26 ENCOUNTER — Encounter (HOSPITAL_COMMUNITY): Payer: Self-pay | Admitting: Emergency Medicine

## 2014-12-26 ENCOUNTER — Other Ambulatory Visit: Payer: Self-pay | Admitting: Interventional Cardiology

## 2014-12-26 DIAGNOSIS — I252 Old myocardial infarction: Secondary | ICD-10-CM | POA: Diagnosis not present

## 2014-12-26 DIAGNOSIS — R011 Cardiac murmur, unspecified: Secondary | ICD-10-CM | POA: Insufficient documentation

## 2014-12-26 DIAGNOSIS — Z7901 Long term (current) use of anticoagulants: Secondary | ICD-10-CM | POA: Diagnosis not present

## 2014-12-26 DIAGNOSIS — Z8579 Personal history of other malignant neoplasms of lymphoid, hematopoietic and related tissues: Secondary | ICD-10-CM | POA: Insufficient documentation

## 2014-12-26 DIAGNOSIS — Z79899 Other long term (current) drug therapy: Secondary | ICD-10-CM | POA: Insufficient documentation

## 2014-12-26 DIAGNOSIS — Z8701 Personal history of pneumonia (recurrent): Secondary | ICD-10-CM | POA: Diagnosis not present

## 2014-12-26 DIAGNOSIS — R079 Chest pain, unspecified: Secondary | ICD-10-CM | POA: Diagnosis present

## 2014-12-26 DIAGNOSIS — M109 Gout, unspecified: Secondary | ICD-10-CM | POA: Diagnosis not present

## 2014-12-26 DIAGNOSIS — E785 Hyperlipidemia, unspecified: Secondary | ICD-10-CM | POA: Diagnosis present

## 2014-12-26 DIAGNOSIS — C9000 Multiple myeloma not having achieved remission: Secondary | ICD-10-CM | POA: Diagnosis present

## 2014-12-26 DIAGNOSIS — G8929 Other chronic pain: Secondary | ICD-10-CM | POA: Insufficient documentation

## 2014-12-26 DIAGNOSIS — K648 Other hemorrhoids: Secondary | ICD-10-CM | POA: Diagnosis not present

## 2014-12-26 DIAGNOSIS — Z87891 Personal history of nicotine dependence: Secondary | ICD-10-CM | POA: Insufficient documentation

## 2014-12-26 DIAGNOSIS — E780 Pure hypercholesterolemia: Secondary | ICD-10-CM | POA: Diagnosis not present

## 2014-12-26 DIAGNOSIS — I4891 Unspecified atrial fibrillation: Secondary | ICD-10-CM | POA: Diagnosis present

## 2014-12-26 DIAGNOSIS — I25810 Atherosclerosis of coronary artery bypass graft(s) without angina pectoris: Secondary | ICD-10-CM | POA: Diagnosis not present

## 2014-12-26 DIAGNOSIS — J449 Chronic obstructive pulmonary disease, unspecified: Secondary | ICD-10-CM | POA: Insufficient documentation

## 2014-12-26 DIAGNOSIS — I10 Essential (primary) hypertension: Secondary | ICD-10-CM | POA: Insufficient documentation

## 2014-12-26 DIAGNOSIS — K219 Gastro-esophageal reflux disease without esophagitis: Secondary | ICD-10-CM | POA: Diagnosis present

## 2014-12-26 HISTORY — DX: Unspecified atrial fibrillation: I48.91

## 2014-12-26 LAB — CBC
HEMATOCRIT: 31.5 % — AB (ref 36.0–46.0)
HEMOGLOBIN: 10.1 g/dL — AB (ref 12.0–15.0)
MCH: 32.8 pg (ref 26.0–34.0)
MCHC: 32.1 g/dL (ref 30.0–36.0)
MCV: 102.3 fL — AB (ref 78.0–100.0)
Platelets: 134 10*3/uL — ABNORMAL LOW (ref 150–400)
RBC: 3.08 MIL/uL — ABNORMAL LOW (ref 3.87–5.11)
RDW: 15.4 % (ref 11.5–15.5)
WBC: 4.5 10*3/uL (ref 4.0–10.5)

## 2014-12-26 LAB — BASIC METABOLIC PANEL
ANION GAP: 6 (ref 5–15)
BUN: 24 mg/dL — AB (ref 6–20)
CHLORIDE: 105 mmol/L (ref 101–111)
CO2: 24 mmol/L (ref 22–32)
Calcium: 9.3 mg/dL (ref 8.9–10.3)
Creatinine, Ser: 1.1 mg/dL — ABNORMAL HIGH (ref 0.44–1.00)
GFR calc Af Amer: 53 mL/min — ABNORMAL LOW (ref >60)
GFR calc non Af Amer: 46 mL/min — ABNORMAL LOW (ref >60)
GLUCOSE: 112 mg/dL — AB (ref 65–99)
POTASSIUM: 4 mmol/L (ref 3.5–5.1)
SODIUM: 135 mmol/L (ref 135–145)

## 2014-12-26 LAB — D-DIMER, QUANTITATIVE (NOT AT ARMC): D DIMER QUANT: 0.34 ug{FEU}/mL (ref 0.00–0.48)

## 2014-12-26 LAB — I-STAT TROPONIN, ED
TROPONIN I, POC: 0.03 ng/mL (ref 0.00–0.08)
Troponin i, poc: 0.03 ng/mL (ref 0.00–0.08)

## 2014-12-26 MED ORDER — ISOSORBIDE MONONITRATE ER 30 MG PO TB24: 30.0000 mg | ORAL_TABLET | Freq: Every day | ORAL | Status: AC

## 2014-12-26 MED ORDER — METOPROLOL SUCCINATE ER 25 MG PO TB24: 25.0000 mg | ORAL_TABLET | Freq: Every day | ORAL | Status: AC

## 2014-12-26 MED ORDER — RIVAROXABAN 15 MG PO TABS: 15.0000 mg | ORAL_TABLET | Freq: Every day | ORAL | Status: DC

## 2014-12-26 MED ORDER — NITROGLYCERIN 0.4 MG SL SUBL
0.4000 mg | SUBLINGUAL_TABLET | SUBLINGUAL | Status: DC | PRN
  Administered 2014-12-26: 0.4 mg via SUBLINGUAL
  Filled 2014-12-26 (×2): qty 1

## 2014-12-26 NOTE — ED Notes (Signed)
Repeat nitro held as bp dropped from 154/54 to 102/51 after administration.

## 2014-12-26 NOTE — ED Notes (Signed)
Patient states she already took 3 nitro tabs today, and 4 baby aspirin this morning.

## 2014-12-26 NOTE — ED Notes (Signed)
Pt sts left sided CP and SOB starting today that feels like when had CABG in past

## 2014-12-26 NOTE — ED Notes (Signed)
Attempted report 

## 2014-12-26 NOTE — ED Notes (Signed)
Reported bp drop after first nitro to dr. Regenia Skeeter, MD acknowledges, no new orders.

## 2014-12-26 NOTE — ED Notes (Signed)
Dr. Regenia Skeeter called for heart rate in 82s

## 2014-12-26 NOTE — ED Notes (Signed)
Phlebotomy at the bedside  

## 2014-12-26 NOTE — ED Provider Notes (Signed)
CSN: [id number]     Arrival date & time 12/26/14  1733 History   First MD Initiated Contact with Patient 12/26/14 2126     Chief Complaint  Patient presents with  . Chest Pain    HPI   [name] is a 81 y.o. female with a PMH of COPD, CAD, multiple myeloma, MI s/p CABG 2005 who presents to the ED with chest pain, which she states started today and has been constant. She reports left-sided chest pain that radiates to her back. She denies exacerbating factors. She has tried nitroglycerin for symptom relief, which is been effective. She denies fever, chills, headache, lightheadedness, dizziness, shortness of breath, abdominal pain, nausea, vomiting, diarrhea, constipation, dysuria, urgency, frequency. She states she felt like she "had gas" when she had her last MI, and is complaining of similar symptoms today.    Past Medical History  Diagnosis Date  . COPD (chronic obstructive pulmonary disease)   . Gout   . Coronary atherosclerosis of native coronary artery 02/05/2012  . Hypertension   . High blood cholesterol   . Heart murmur     "all my life" (02/05/2012)  . Anginal pain   . Myocardial infarction 1990's; 2000's    "2 total, I think" (02/05/2012)  . Pneumonia     "several times" (02/05/2012)  . Chronic bronchitis   . Shortness of breath     "sometimes when I'm laying down; always when I do too much" (02/05/2012)  . History of blood transfusion   . External bleeding hemorrhoids     "act up at times" (02/05/2012)  . Chronic back pain     "all over" (02/05/2012)  . Depression   . CAD (coronary artery disease) of artery bypass graft 02/09/14    atretic LIMA  . Multiple myeloma 09/07/2014   Past Surgical History  Procedure Laterality Date  . Appendectomy  ? 1970's  . Ectopic pregnancy surgery  1970's  . Abdominal hysterectomy  ? 1970's    "partial 1st time; complete 2nd" (02/05/2012)  . Cholecystectomy  ~ 2010  . Cataract extraction w/ intraocular lens  implant,  bilateral  [id number]  . Coronary angioplasty with stent placement  2005  . Coronary artery bypass graft  2005    CABG X2  . Cardiac catheterization  02/09/14    atretic LIMA,  patent VG-dRCA and Total occlusion of the native RCA proximally prior to remotely placement RCA stent, with mild proximal 30% LAD stenosis and 30% distal circumflex stenoses.  EF 55%.  . Left heart catheterization with coronary/graft angiogram N/A 02/09/2014    Procedure: LEFT HEART CATHETERIZATION WITH Beatrix Fetters;  Surgeon: Troy Sine, MD;  Location: [facility] CATH LAB;  Service: Cardiovascular;  Laterality: N/A;  . Esophagogastroduodenoscopy (egd) with propofol Left 05/22/2014    Procedure: ESOPHAGOGASTRODUODENOSCOPY (EGD) WITH PROPOFOL;  Surgeon: Arta Silence, MD;  Location: [facility] ENDOSCOPY;  Service: Endoscopy;  Laterality: Left;   Family History  Problem Relation Age of Onset  . Anemia Neg Hx   . Arrhythmia Neg Hx   . Asthma Neg Hx   . Clotting disorder Neg Hx   . Fainting Neg Hx   . Heart attack Neg Hx   . Heart disease Neg Hx   . Heart failure Neg Hx   . Hyperlipidemia Neg Hx   . Hypertension Neg Hx   . Aneurysm    . CVA    . CVA Mother   . Aneurysm Father    Social History  Substance  Use Topics  . Smoking status: Former Smoker -- 1.00 packs/day for 50 years    Types: Cigarettes    Quit date: 04/02/2011  . Smokeless tobacco: Never Used  . Alcohol Use: Yes     Comment: says she drinks wine once in a while   OB History    No data available      Review of Systems  Constitutional: Negative for fever, chills, activity change, appetite change and fatigue.  Eyes: Negative for visual disturbance.  Respiratory: Negative for shortness of breath.   Cardiovascular: Positive for chest pain. Negative for palpitations and leg swelling.  Gastrointestinal: Negative for nausea, vomiting, abdominal pain, diarrhea and constipation.  Genitourinary: Negative for dysuria, urgency and frequency.   Musculoskeletal: Positive for back pain.  Neurological: Negative for dizziness, syncope, weakness, light-headedness, numbness and headaches.  All other systems reviewed and are negative.     Allergies  Lexapro; Soma; Wellbutrin; Albuterol sulfate; Codeine; Levaquin; Plavix; and Statins  Home Medications   Prior to Admission medications   Medication Sig Start Date End Date Taking? Authorizing Provider  acetaminophen (TYLENOL) 325 MG tablet Take 2 tablets (650 mg total) by mouth every 6 (six) hours as needed for mild pain (or Fever >/= 101). 09/13/14   Reyne Dumas, MD  acyclovir (ZOVIRAX) 400 MG tablet Take 1 tablet (400 mg total) by mouth daily. 10/27/14   Carlton Adam, PA-C  albuterol-ipratropium (COMBIVENT) 18-103 MCG/ACT inhaler Inhale 1-2 puffs into the lungs every 4 (four) hours as needed for wheezing or shortness of breath.    Historical Provider, MD  cholecalciferol (VITAMIN D) 1000 UNITS tablet Take 1,000 Units by mouth every morning.     Historical Provider, MD  COMBIVENT RESPIMAT 20-100 MCG/ACT AERS respimat  10/28/14   Historical Provider, MD  dexamethasone (DECADRON) 4 MG tablet Take 5 tablets every week before chemotherapy injection. 10/30/14   Carlton Adam, PA-C  ezetimibe (ZETIA) 10 MG tablet Take 1 tablet (10 mg total) by mouth at bedtime. 05/25/14   Hosie Poisson, MD  feeding supplement, ENSURE ENLIVE, (ENSURE ENLIVE) LIQD Take 237 mLs by mouth 3 (three) times daily between meals. 09/13/14   Reyne Dumas, MD  isosorbide mononitrate (IMDUR) 30 MG 24 hr tablet Take 1 tablet (30 mg total) by mouth daily. 12/26/14   Belva Crome, MD  metoprolol succinate (TOPROL-XL) 25 MG 24 hr tablet Take 1 tablet (25 mg total) by mouth daily. 12/26/14   Belva Crome, MD  nitroGLYCERIN (NITROSTAT) 0.4 MG SL tablet Place 0.4 mg under the tongue every 5 (five) minutes as needed for chest pain.    Historical Provider, MD  Omega-3 Fatty Acids (FISH OIL) 1200 MG CAPS Take 1,200-2,400 mg by mouth 2  (two) times daily. Take 2 capsules (2400 mg) every morning and 1 capsule (1200 mg) every night    Historical Provider, MD  pantoprazole (PROTONIX) 40 MG tablet Take 1 tablet (40 mg total) by mouth 2 (two) times daily. 05/25/14   Hosie Poisson, MD  polyethylene glycol powder (GLYCOLAX/MIRALAX) powder Take 17 g by mouth every morning. Mix with 8 oz liquid and drink 05/16/14   Historical Provider, MD  prochlorperazine (COMPAZINE) 10 MG tablet  08/02/14   Historical Provider, MD  RA SENNA 8.6 MG tablet Take 2 tablets by mouth at bedtime.  02/07/14   Historical Provider, MD  Rivaroxaban (XARELTO) 15 MG TABS tablet Take 1 tablet (15 mg total) by mouth daily with supper. 12/26/14   Belva Crome, MD  tiotropium (SPIRIVA) 18 MCG inhalation capsule Place 18 mcg into inhaler and inhale daily as needed (shortness of breath).     Historical Provider, MD    BP 110/70 mmHg  Pulse 68  Temp(Src) 97.9 F (36.6 C) (Oral)  Resp 18  SpO2 100% Physical Exam  Constitutional: She is oriented to person, place, and time. She appears well-developed and well-nourished. No distress.  HENT:  Head: Normocephalic and atraumatic.  Right Ear: External ear normal.  Left Ear: External ear normal.  Nose: Nose normal.  Mouth/Throat: Uvula is midline, oropharynx is clear and moist and mucous membranes are normal.  Eyes: Conjunctivae, EOM and lids are normal. Pupils are equal, round, and reactive to light. Right eye exhibits no discharge. Left eye exhibits no discharge. No scleral icterus.  Neck: Normal range of motion. Neck supple.  Cardiovascular: Normal rate, regular rhythm, normal heart sounds, intact distal pulses and normal pulses.   Regular rate and rhythm with frequent ectopy.  Pulmonary/Chest: Effort normal and breath sounds normal. No respiratory distress. She has no wheezes. She has no rales.  Mild tenderness to palpation of left chest wall.  Abdominal: Soft. Normal appearance and bowel sounds are normal. She exhibits no  distension and no mass. There is no tenderness. There is no rigidity, no rebound and no guarding.  Musculoskeletal: Normal range of motion. She exhibits no edema or tenderness.  Neurological: She is alert and oriented to person, place, and time.  Skin: Skin is warm, dry and intact. No rash noted. She is not diaphoretic. No erythema. No pallor.  Psychiatric: She has a normal mood and affect. Her speech is normal and behavior is normal. Judgment and thought content normal.  Nursing note and vitals reviewed.   ED Course  Procedures (including critical care time)  Labs Review Labs Reviewed  BASIC METABOLIC PANEL - Abnormal; Notable for the following:    Glucose, Bld 112 (*)    BUN 24 (*)    Creatinine, Ser 1.10 (*)    GFR calc non Af Amer 46 (*)    GFR calc Af Amer 53 (*)    All other components within normal limits  CBC - Abnormal; Notable for the following:    RBC 3.08 (*)    Hemoglobin 10.1 (*)    HCT 31.5 (*)    MCV 102.3 (*)    Platelets 134 (*)    All other components within normal limits  URINALYSIS, ROUTINE W REFLEX MICROSCOPIC (NOT AT [facility]) - Abnormal; Notable for the following:    Color, Urine AMBER (*)    APPearance CLOUDY (*)    Hgb urine dipstick LARGE (*)    Bilirubin Urine SMALL (*)    Protein, ur 100 (*)    All other components within normal limits  URINE MICROSCOPIC-ADD ON - Abnormal; Notable for the following:    Squamous Epithelial / LPF FEW (*)    Bacteria, UA FEW (*)    Casts HYALINE CASTS (*)    All other components within normal limits  MRSA PCR SCREENING  D-DIMER, QUANTITATIVE (NOT AT [facility])  Randolm Idol, ED  Randolm Idol, ED    Imaging Review Dg Chest 2 View  12/26/2014   CLINICAL DATA:  Chest pain.  No shortness of breath.  EXAM: CHEST  2 VIEW  COMPARISON:  10/09/2014  FINDINGS: Previous median sternotomy and CABG procedure. Aortic atherosclerosis. The heart size is mildly enlarged. No pleural effusion. Spondylosis noted within the thoracic  spine.  IMPRESSION: 1. Cardiac enlargement. 2.  Aortic atherosclerosis.   Electronically Signed   By: Kerby Moors M.D.   On: 12/26/2014 19:05   I have personally reviewed and evaluated these images and lab results as part of my medical decision-making.   EKG Interpretation   Date/Time:  Tuesday December 26 2014 22:05:21 EDT Ventricular Rate:  44 PR Interval:  220 QRS Duration: 95 QT Interval:  482 QTC Calculation: 412 R Axis:   103 Text Interpretation:  Sinus bradycardia Right axis deviation Nonspecific T  abnormalities, lateral leads rate is slower, otherwise no significant  change since earlier in the day Confirmed by GOLDSTON  MD, SCOTT ([id number]) on  12/26/2014 10:16:53 PM      MDM   Final diagnoses:  Chest pain, unspecified chest pain type    81 year old female presents with chest pain, which she states started today. She reports radiation to her back. She states her symptoms feel similar to when she had her MI in 2005. She states she took nitroglycerin today for symptom relief. Denies fever, chills, headache, lightheadedness, dizziness, shortness of breath, abdominal pain, nausea, vomiting, diarrhea, constipation, dysuria, urgency, frequency.   In the ED, patient became bradycardic to the 40s and blood pressure dropped to the low 448J systolic. Nitro held. Patient afebrile. Heart regular rate and rhythm with frequent ectopy. Lungs clear to auscultation bilaterally. Abdomen soft, nontender, nondistended. No lower extremity edema.  Last cath 02/09/14 with total occlusion of the native RCA proximally prior to remotely placement RCA stent, with mild proximal 30% LAD stenosis and 30% distal circumflex stenoses; atretic LIMA graft to the mid LAD; patent saphenous vein graft supplying the distal RCA. Last echo 09/11/14 with EF 60-65%, no regional wall motion abnormality. Patient recently admitted in July for similar symptoms, but did not undergo provocative cardiac testing.  CBC with  hemoglobin 10.1. BMP with creatinine 1.1. EKG no acute ischemia. Troponin negative x 2. D-dimer within normal limits. Chest x-ray with cardiac enlargement and aortic atherosclerosis.  Patient to be admitted, as she reports her symptoms feel similar to her prior MI.[name] Hospitalist consulted. Dr. Regenia Skeeter spoke with hospitalist, who will admit the patient for further evaluation and management.  BP 168/90 mmHg  Pulse 51  Temp(Src) 97.7 F (36.5 C) (Oral)  Resp 16  Ht $R'5\' 2"'MU$  (1.575 m)  Wt 132 lb 6.4 oz (60.056 kg)  BMI 24.21 kg/m2  SpO2 100%   Marella Chimes, PA-C 12/27/14 0214  Sherwood Gambler, MD 12/29/14 [phone]

## 2014-12-27 ENCOUNTER — Observation Stay (HOSPITAL_COMMUNITY): Payer: Medicare Other

## 2014-12-27 ENCOUNTER — Encounter (HOSPITAL_COMMUNITY): Payer: Self-pay | Admitting: *Deleted

## 2014-12-27 DIAGNOSIS — K219 Gastro-esophageal reflux disease without esophagitis: Secondary | ICD-10-CM

## 2014-12-27 DIAGNOSIS — R079 Chest pain, unspecified: Secondary | ICD-10-CM | POA: Diagnosis not present

## 2014-12-27 DIAGNOSIS — I25810 Atherosclerosis of coronary artery bypass graft(s) without angina pectoris: Secondary | ICD-10-CM | POA: Diagnosis not present

## 2014-12-27 DIAGNOSIS — I25708 Atherosclerosis of coronary artery bypass graft(s), unspecified, with other forms of angina pectoris: Secondary | ICD-10-CM | POA: Diagnosis not present

## 2014-12-27 DIAGNOSIS — I10 Essential (primary) hypertension: Secondary | ICD-10-CM

## 2014-12-27 DIAGNOSIS — I4891 Unspecified atrial fibrillation: Secondary | ICD-10-CM | POA: Diagnosis not present

## 2014-12-27 DIAGNOSIS — J42 Unspecified chronic bronchitis: Secondary | ICD-10-CM

## 2014-12-27 DIAGNOSIS — E785 Hyperlipidemia, unspecified: Secondary | ICD-10-CM

## 2014-12-27 DIAGNOSIS — C9000 Multiple myeloma not having achieved remission: Secondary | ICD-10-CM

## 2014-12-27 LAB — CBC WITH DIFFERENTIAL/PLATELET
Basophils Absolute: 0 10*3/uL (ref 0.0–0.1)
Basophils Relative: 0 %
EOS ABS: 0 10*3/uL (ref 0.0–0.7)
EOS PCT: 1 %
HCT: 29.4 % — ABNORMAL LOW (ref 36.0–46.0)
Hemoglobin: 9.5 g/dL — ABNORMAL LOW (ref 12.0–15.0)
LYMPHS ABS: 2.2 10*3/uL (ref 0.7–4.0)
Lymphocytes Relative: 55 %
MCH: 33 pg (ref 26.0–34.0)
MCHC: 32.3 g/dL (ref 30.0–36.0)
MCV: 102.1 fL — ABNORMAL HIGH (ref 78.0–100.0)
MONO ABS: 0.4 10*3/uL (ref 0.1–1.0)
MONOS PCT: 11 %
Neutro Abs: 1.3 10*3/uL — ABNORMAL LOW (ref 1.7–7.7)
Neutrophils Relative %: 34 %
PLATELETS: 112 10*3/uL — AB (ref 150–400)
RBC: 2.88 MIL/uL — ABNORMAL LOW (ref 3.87–5.11)
RDW: 15.4 % (ref 11.5–15.5)
WBC: 4 10*3/uL (ref 4.0–10.5)

## 2014-12-27 LAB — COMPREHENSIVE METABOLIC PANEL
ALK PHOS: 56 U/L (ref 38–126)
ALT: 18 U/L (ref 14–54)
ANION GAP: 6 (ref 5–15)
AST: 23 U/L (ref 15–41)
Albumin: 3.2 g/dL — ABNORMAL LOW (ref 3.5–5.0)
BUN: 24 mg/dL — ABNORMAL HIGH (ref 6–20)
CALCIUM: 8.9 mg/dL (ref 8.9–10.3)
CHLORIDE: 104 mmol/L (ref 101–111)
CO2: 26 mmol/L (ref 22–32)
Creatinine, Ser: 0.91 mg/dL (ref 0.44–1.00)
GFR calc non Af Amer: 58 mL/min — ABNORMAL LOW (ref >60)
Glucose, Bld: 99 mg/dL (ref 65–99)
POTASSIUM: 4 mmol/L (ref 3.5–5.1)
SODIUM: 136 mmol/L (ref 135–145)
Total Bilirubin: 0.5 mg/dL (ref 0.3–1.2)
Total Protein: 6.7 g/dL (ref 6.5–8.1)

## 2014-12-27 LAB — NM MYOCAR MULTI W/SPECT W/WALL MOTION / EF
CSEPPHR: 96 {beats}/min
MPHR: 139 {beats}/min
Percent HR: 69 %
Rest HR: 54 {beats}/min

## 2014-12-27 LAB — TROPONIN I: Troponin I: <0.03 ng/mL (ref <0.031)

## 2014-12-27 LAB — URINALYSIS, ROUTINE W REFLEX MICROSCOPIC
Glucose, UA: NEGATIVE mg/dL
Ketones, ur: NEGATIVE mg/dL
LEUKOCYTES UA: NEGATIVE
Nitrite: NEGATIVE
PROTEIN: 100 mg/dL — AB
SPECIFIC GRAVITY, URINE: 1.023 (ref 1.005–1.030)
UROBILINOGEN UA: 0.2 mg/dL (ref 0.0–1.0)
pH: 5 (ref 5.0–8.0)

## 2014-12-27 LAB — URINE MICROSCOPIC-ADD ON

## 2014-12-27 LAB — MRSA PCR SCREENING: MRSA by PCR: NEGATIVE

## 2014-12-27 MED ORDER — EZETIMIBE 10 MG PO TABS: 10.0000 mg | ORAL_TABLET | Freq: Every day | ORAL | Status: DC

## 2014-12-27 MED ORDER — REGADENOSON 0.4 MG/5ML IV SOLN
INTRAVENOUS | Status: AC
  Administered 2014-12-27: 11:00:00
  Filled 2014-12-27: qty 5

## 2014-12-27 MED ORDER — ACETAMINOPHEN 325 MG PO TABS: 650.0000 mg | ORAL_TABLET | ORAL | Status: DC | PRN

## 2014-12-27 MED ORDER — NITROGLYCERIN 2 % TD OINT
0.5000 [in_us] | TOPICAL_OINTMENT | Freq: Four times a day (QID) | TRANSDERMAL | Status: DC
  Administered 2014-12-27 (×3): 0.5 [in_us] via TOPICAL
  Filled 2014-12-27: qty 30

## 2014-12-27 MED ORDER — TECHNETIUM TC 99M SESTAMIBI GENERIC - CARDIOLITE
30.0000 | Freq: Once | INTRAVENOUS | Status: AC | PRN
  Administered 2014-12-27: 30 via INTRAVENOUS

## 2014-12-27 MED ORDER — REGADENOSON 0.4 MG/5ML IV SOLN
0.4000 mg | Freq: Once | INTRAVENOUS | Status: AC
  Administered 2014-12-27: 0.4 mg via INTRAVENOUS
  Filled 2014-12-27: qty 5

## 2014-12-27 MED ORDER — TIOTROPIUM BROMIDE MONOHYDRATE 18 MCG IN CAPS: 18.0000 ug | ORAL_CAPSULE | Freq: Every day | RESPIRATORY_TRACT | Status: DC | PRN

## 2014-12-27 MED ORDER — MORPHINE SULFATE (PF) 2 MG/ML IV SOLN
2.0000 mg | INTRAVENOUS | Status: DC | PRN
  Administered 2014-12-27 (×2): 4 mg via INTRAVENOUS
  Filled 2014-12-27 (×2): qty 2

## 2014-12-27 MED ORDER — TECHNETIUM TC 99M SESTAMIBI GENERIC - CARDIOLITE
10.0000 | Freq: Once | INTRAVENOUS | Status: AC | PRN
  Administered 2014-12-27: 10 via INTRAVENOUS

## 2014-12-27 MED ORDER — POLYETHYLENE GLYCOL 3350 17 GM/SCOOP PO POWD: 17.0000 g | ORAL | Status: DC

## 2014-12-27 MED ORDER — FENTANYL CITRATE (PF) 100 MCG/2ML IJ SOLN
25.0000 ug | INTRAMUSCULAR | Status: DC | PRN
  Administered 2014-12-27: 50 ug via INTRAVENOUS
  Filled 2014-12-27: qty 2

## 2014-12-27 MED ORDER — POLYETHYLENE GLYCOL 3350 17 G PO PACK
17.0000 g | PACK | Freq: Every day | ORAL | Status: DC
  Administered 2014-12-27: 17 g via ORAL
  Filled 2014-12-27: qty 1

## 2014-12-27 MED ORDER — IPRATROPIUM-ALBUTEROL 0.5-2.5 (3) MG/3ML IN SOLN: 3.0000 mL | RESPIRATORY_TRACT | Status: DC | PRN

## 2014-12-27 MED ORDER — RIVAROXABAN 15 MG PO TABS
15.0000 mg | ORAL_TABLET | Freq: Every day | ORAL | Status: DC
  Administered 2014-12-27: 15 mg via ORAL
  Filled 2014-12-27: qty 1

## 2014-12-27 MED ORDER — GI COCKTAIL ~~LOC~~
30.0000 mL | Freq: Four times a day (QID) | ORAL | Status: DC | PRN
  Administered 2014-12-27: 30 mL via ORAL
  Filled 2014-12-27: qty 30

## 2014-12-27 MED ORDER — HYDRALAZINE HCL 20 MG/ML IJ SOLN
10.0000 mg | INTRAMUSCULAR | Status: DC | PRN
  Administered 2014-12-27: 10 mg via INTRAVENOUS
  Filled 2014-12-27: qty 1

## 2014-12-27 MED ORDER — ASPIRIN EC 325 MG PO TBEC
325.0000 mg | DELAYED_RELEASE_TABLET | Freq: Every day | ORAL | Status: DC
  Administered 2014-12-27: 325 mg via ORAL
  Filled 2014-12-27: qty 1

## 2014-12-27 MED ORDER — PANTOPRAZOLE SODIUM 40 MG PO TBEC
40.0000 mg | DELAYED_RELEASE_TABLET | Freq: Two times a day (BID) | ORAL | Status: DC
  Administered 2014-12-27 (×2): 40 mg via ORAL
  Filled 2014-12-27 (×2): qty 1

## 2014-12-27 MED ORDER — METOPROLOL SUCCINATE ER 25 MG PO TB24
25.0000 mg | ORAL_TABLET | Freq: Every day | ORAL | Status: DC
  Filled 2014-12-27: qty 1

## 2014-12-27 MED ORDER — IPRATROPIUM-ALBUTEROL 18-103 MCG/ACT IN AERO: 1.0000 | INHALATION_SPRAY | RESPIRATORY_TRACT | Status: DC | PRN

## 2014-12-27 MED ORDER — ISOSORBIDE MONONITRATE ER 30 MG PO TB24
30.0000 mg | ORAL_TABLET | Freq: Every day | ORAL | Status: DC
  Administered 2014-12-27: 30 mg via ORAL
  Filled 2014-12-27: qty 1

## 2014-12-27 MED ORDER — ACYCLOVIR 400 MG PO TABS
400.0000 mg | ORAL_TABLET | Freq: Every day | ORAL | Status: DC
  Administered 2014-12-27: 400 mg via ORAL
  Filled 2014-12-27: qty 1

## 2014-12-27 MED ORDER — ONDANSETRON HCL 4 MG/2ML IJ SOLN: 4.0000 mg | Freq: Four times a day (QID) | INTRAMUSCULAR | Status: DC | PRN

## 2014-12-27 NOTE — Progress Notes (Signed)
ANTICOAGULATION CONSULT NOTE - Initial Consult  Pharmacy Consult for Xarelto Indication: atrial fibrillation  Allergies  Allergen Reactions  . Lexapro [Escitalopram Oxalate] Other (See Comments)    Fatigue   . Soma [Carisoprodol] Other (See Comments)    Fatigue   . Wellbutrin [Bupropion] Other (See Comments)    "couldn't function and take it"  . Albuterol Sulfate Other (See Comments)    Albuterol HFA inhaler caused nervousness   . Codeine Hives  . Levaquin [Levofloxacin In D5w] Other (See Comments)    Unknown allergic reaction  . Plavix [Clopidogrel Bisulfate] Other (See Comments)    Unknown reaction  . Statins     Patient Measurements: Height: _0  (157.5 cm) Weight: 132 lb 6.4 oz (60.056 kg) IBW/kg (Calculated) : 50.1  Vital Signs: Temp: 97.7 F (36.5 C) (09/20 2350) Temp Source: Oral (09/20 2350) BP: 176/55 mmHg (09/21 0447) Pulse Rate: 51 (09/20 2350)  Labs:  Recent Labs  12/26/14 1831  HGB 10.1*  HCT 31.5*  PLT 134*  CREATININE 1.10*    Estimated Creatinine Clearance: 31.7 mL/min (by C-G formula based on Cr of 1.1).   Medical History: Past Medical History  Diagnosis Date  . COPD (chronic obstructive pulmonary disease)   . Gout   . Coronary atherosclerosis of native coronary artery 02/05/2012  . Hypertension   . High blood cholesterol   . Heart murmur     "all my life" (02/05/2012)  . Anginal pain   . Myocardial infarction 1990's; 2000's    "2 total, I think" (02/05/2012)  . Pneumonia     "several times" (02/05/2012)  . Chronic bronchitis   . Shortness of breath     "sometimes when I'm laying down; always when I do too much" (02/05/2012)  . History of blood transfusion   . External bleeding hemorrhoids     "act up at times" (02/05/2012)  . Chronic back pain     "all over" (02/05/2012)  . Depression   . CAD (coronary artery disease) of artery bypass graft 02/09/14    atretic LIMA  . Multiple myeloma 09/07/2014    Medications:   Prescriptions prior to admission  Medication Sig Dispense Refill Last Dose  . acetaminophen (TYLENOL) 325 MG tablet Take 2 tablets (650 mg total) by mouth every 6 (six) hours as needed for mild pain (or Fever >/= 101). 60 tablet 0 Past Week at Unknown time  . acyclovir (ZOVIRAX) 400 MG tablet Take 1 tablet (400 mg total) by mouth daily. 30 tablet 3 12/25/2014 at Unknown time  . albuterol-ipratropium (COMBIVENT) 18-103 MCG/ACT inhaler Inhale 1-2 puffs into the lungs every 4 (four) hours as needed for wheezing or shortness of breath.   12/25/2014 at Unknown time  . aspirin EC 81 MG tablet Take 325 mg by mouth daily.    12/26/2014 at Unknown time  . cholecalciferol (VITAMIN D) 1000 UNITS tablet Take 1,000 Units by mouth every morning.    12/25/2014 at Unknown time  . dexamethasone (DECADRON) 4 MG tablet Take 5 tablets every week before chemotherapy injection. 40 tablet 3 Past Week at Unknown time  . ezetimibe (ZETIA) 10 MG tablet Take 1 tablet (10 mg total) by mouth at bedtime.   12/25/2014 at Unknown time  . feeding supplement, ENSURE ENLIVE, (ENSURE ENLIVE) LIQD Take 237 mLs by mouth 3 (three) times daily between meals. 237 mL 12 Past Month at Unknown time  . isosorbide mononitrate (IMDUR) 30 MG 24 hr tablet Take 1 tablet (30 mg total) by mouth  daily. 30 tablet 3 12/25/2014 at Unknown time  . metoprolol succinate (TOPROL-XL) 25 MG 24 hr tablet Take 1 tablet (25 mg total) by mouth daily. 30 tablet 3 12/25/2014 at 0800  . nitroGLYCERIN (NITROSTAT) 0.4 MG SL tablet Place 0.4 mg under the tongue every 5 (five) minutes as needed for chest pain.   12/26/2014 at Unknown time  . Omega-3 Fatty Acids (FISH OIL) 1200 MG CAPS Take 1,200-2,400 mg by mouth 2 (two) times daily. Take 2 capsules (2400 mg) every morning and 1 capsule (1200 mg) every night   12/25/2014 at Unknown time  . pantoprazole (PROTONIX) 40 MG tablet Take 1 tablet (40 mg total) by mouth 2 (two) times daily.  0 12/25/2014 at Unknown time  . polyethylene  glycol powder (GLYCOLAX/MIRALAX) powder Take 17 g by mouth every morning. Mix with 8 oz liquid and drink  0 Past Month at Unknown time  . prochlorperazine (COMPAZINE) 10 MG tablet Take 10 mg by mouth every 8 (eight) hours as needed for nausea.   0 Past Month at Unknown time  . Rivaroxaban (XARELTO) 15 MG TABS tablet Take 1 tablet (15 mg total) by mouth daily with supper. 30 tablet 3 12/25/2014 at Unknown time  . tiotropium (SPIRIVA) 18 MCG inhalation capsule Place 18 mcg into inhaler and inhale daily as needed (shortness of breath).    12/25/2014 at Unknown time    Assessment: 81 y.o. female admitted with chest pain, h/o Afib to continue Xarelto    Plan:  Continue Xarelto 15 mg daily  Tiwanda Threats, Bronson Curb 12/27/2014,5:00 AM

## 2014-12-27 NOTE — H&P (Signed)
[facility] History and Physical  Patient: [name]  MRN: [id number]  DOB: [birth year]  DOS: the patient was seen and examined on 12/27/2014 PCP: [name], MD  Referring physician: Dr. Regenia Skeeter Chief Complaint: Chest pain  HPI: [name] is a 81 y.o. female with Past medical history of COPD, coronary disease, hypertension, dyslipidemia, A. fib on anticoagulation, GERD, multiple myeloma. Patient presents with complains of chest pain. The pain is located on the left side. Extension she mentions this is a sharp stabbing pain. Patient denies any complaint of shortness of breath or cough or fever or chills. Patient denies any radiation of nausea no vomiting no diarrhea no constipation. No leg swelling. No recent travel no recent hospitalization or surgery or procedure. No recent change in her medication.  he patient is coming from home.  At her baseline ambulates without any support And is independent for most of her ADL; manages her medication on her own.  Review of Systems: as mentioned in the history of present illness.  A comprehensive review of the other systems is negative.  Past Medical History  Diagnosis Date  . COPD (chronic obstructive pulmonary disease)   . Gout   . Coronary atherosclerosis of native coronary artery 02/05/2012  . Hypertension   . High blood cholesterol   . Heart murmur     "all my life" (02/05/2012)  . Anginal pain   . Myocardial infarction 1990's; 2000's    "2 total, I think" (02/05/2012)  . Pneumonia     "several times" (02/05/2012)  . Chronic bronchitis   . Shortness of breath     "sometimes when I'm laying down; always when I do too much" (02/05/2012)  . History of blood transfusion   . External bleeding hemorrhoids     "act up at times" (02/05/2012)  . Chronic back pain     "all over" (02/05/2012)  . Depression   . CAD (coronary artery disease) of artery bypass graft 02/09/14    atretic LIMA  . Multiple myeloma  09/07/2014   Past Surgical History  Procedure Laterality Date  . Appendectomy  ? 1970's  . Ectopic pregnancy surgery  1970's  . Abdominal hysterectomy  ? 1970's    "partial 1st time; complete 2nd" (02/05/2012)  . Cholecystectomy  ~ 2010  . Cataract extraction w/ intraocular lens  implant, bilateral  [phone]  . Coronary angioplasty with stent placement  2005  . Coronary artery bypass graft  2005    CABG X2  . Cardiac catheterization  02/09/14    atretic LIMA,  patent VG-dRCA and Total occlusion of the native RCA proximally prior to remotely placement RCA stent, with mild proximal 30% LAD stenosis and 30% distal circumflex stenoses.  EF 55%.  . Left heart catheterization with coronary/graft angiogram N/A 02/09/2014    Procedure: LEFT HEART CATHETERIZATION WITH Beatrix Fetters;  Surgeon: Troy Sine, MD;  Location: [facility] CATH LAB;  Service: Cardiovascular;  Laterality: N/A;  . Esophagogastroduodenoscopy (egd) with propofol Left 05/22/2014    Procedure: ESOPHAGOGASTRODUODENOSCOPY (EGD) WITH PROPOFOL;  Surgeon: Arta Silence, MD;  Location: [facility] ENDOSCOPY;  Service: Endoscopy;  Laterality: Left;   Social History:  reports that she quit smoking about 3 years ago. Her smoking use included Cigarettes. She has a 50 pack-year smoking history. She has never used smokeless tobacco. She reports that she drinks alcohol. She reports that she does not use illicit drugs.  Allergies  Allergen Reactions  . Lexapro [Escitalopram Oxalate] Other (See Comments)  Fatigue   . Soma [Carisoprodol] Other (See Comments)    Fatigue   . Wellbutrin [Bupropion] Other (See Comments)    "couldn't function and take it"  . Albuterol Sulfate Other (See Comments)    Albuterol HFA inhaler caused nervousness   . Codeine Hives  . Levaquin [Levofloxacin In D5w] Other (See Comments)    Unknown allergic reaction  . Plavix [Clopidogrel Bisulfate] Other (See Comments)    Unknown reaction  . Statins     Family  History  Problem Relation Age of Onset  . Anemia Neg Hx   . Arrhythmia Neg Hx   . Asthma Neg Hx   . Clotting disorder Neg Hx   . Fainting Neg Hx   . Heart attack Neg Hx   . Heart disease Neg Hx   . Heart failure Neg Hx   . Hyperlipidemia Neg Hx   . Hypertension Neg Hx   . Aneurysm    . CVA    . CVA Mother   . Aneurysm Father     Prior to Admission medications   Medication Sig Start Date End Date Taking? Authorizing Provider  acetaminophen (TYLENOL) 325 MG tablet Take 2 tablets (650 mg total) by mouth every 6 (six) hours as needed for mild pain (or Fever >/= 101). 09/13/14  Yes Reyne Dumas, MD  acyclovir (ZOVIRAX) 400 MG tablet Take 1 tablet (400 mg total) by mouth daily. 10/27/14  Yes Carlton Adam, PA-C  albuterol-ipratropium (COMBIVENT) 18-103 MCG/ACT inhaler Inhale 1-2 puffs into the lungs every 4 (four) hours as needed for wheezing or shortness of breath.   Yes Historical Provider, MD  aspirin EC 81 MG tablet Take 325 mg by mouth daily.    Yes Historical Provider, MD  cholecalciferol (VITAMIN D) 1000 UNITS tablet Take 1,000 Units by mouth every morning.    Yes Historical Provider, MD  dexamethasone (DECADRON) 4 MG tablet Take 5 tablets every week before chemotherapy injection. 10/30/14  Yes Carlton Adam, PA-C  ezetimibe (ZETIA) 10 MG tablet Take 1 tablet (10 mg total) by mouth at bedtime. 05/25/14  Yes Hosie Poisson, MD  feeding supplement, ENSURE ENLIVE, (ENSURE ENLIVE) LIQD Take 237 mLs by mouth 3 (three) times daily between meals. 09/13/14  Yes Reyne Dumas, MD  isosorbide mononitrate (IMDUR) 30 MG 24 hr tablet Take 1 tablet (30 mg total) by mouth daily. 12/26/14  Yes Belva Crome, MD  metoprolol succinate (TOPROL-XL) 25 MG 24 hr tablet Take 1 tablet (25 mg total) by mouth daily. 12/26/14  Yes Belva Crome, MD  nitroGLYCERIN (NITROSTAT) 0.4 MG SL tablet Place 0.4 mg under the tongue every 5 (five) minutes as needed for chest pain.   Yes Historical Provider, MD  Omega-3 Fatty  Acids (FISH OIL) 1200 MG CAPS Take 1,200-2,400 mg by mouth 2 (two) times daily. Take 2 capsules (2400 mg) every morning and 1 capsule (1200 mg) every night   Yes Historical Provider, MD  pantoprazole (PROTONIX) 40 MG tablet Take 1 tablet (40 mg total) by mouth 2 (two) times daily. 05/25/14  Yes Hosie Poisson, MD  polyethylene glycol powder (GLYCOLAX/MIRALAX) powder Take 17 g by mouth every morning. Mix with 8 oz liquid and drink 05/16/14  Yes Historical Provider, MD  prochlorperazine (COMPAZINE) 10 MG tablet Take 10 mg by mouth every 8 (eight) hours as needed for nausea.  08/02/14  Yes Historical Provider, MD  Rivaroxaban (XARELTO) 15 MG TABS tablet Take 1 tablet (15 mg total) by mouth daily with supper. 12/26/14  Yes Belva Crome, MD  tiotropium (SPIRIVA) 18 MCG inhalation capsule Place 18 mcg into inhaler and inhale daily as needed (shortness of breath).    Yes Historical Provider, MD    Physical Exam: Filed Vitals:   12/27/14 0325 12/27/14 0400 12/27/14 0447 12/27/14 0500  BP: 165/78 156/65 176/55 153/47  Pulse:    48  Temp:    97.7 F (36.5 C)  TempSrc:    Oral  Resp:    18  Height:      Weight:      SpO2:    100%    General: Alert, Awake and Oriented to Time, Place and Person. Appear in mild distress Eyes: PERRL ENT: Oral Mucosa clear moist. Neck: no JVD Cardiovascular: S1 and S2 Present, no Murmur, Peripheral Pulses Present Respiratory: Bilateral Air entry equal and Decreased,  Clear to Auscultation, no Crackles, no wheezes Abdomen: Bowel Sound present, Soft and no tenderness Skin: no Rash Extremities: no Pedal edema, no calf tenderness Neurologic: Grossly no focal neuro deficit.  Labs on Admission:  CBC:  Recent Labs Lab 12/22/14 1529 12/26/14 1831  WBC 5.4 4.5  NEUTROABS 4.3  --   HGB 10.6* 10.1*  HCT 31.6* 31.5*  MCV 102.2* 102.3*  PLT 152 134*    CMP     Component Value Date/Time   NA 135 12/26/2014 1831   NA 139 12/22/2014 1529   K 4.0 12/26/2014 1831   K  4.4 12/22/2014 1529   CL 105 12/26/2014 1831   CO2 24 12/26/2014 1831   CO2 26 12/22/2014 1529   GLUCOSE 112* 12/26/2014 1831   GLUCOSE 124 12/22/2014 1529   BUN 24* 12/26/2014 1831   BUN 21.1 12/22/2014 1529   CREATININE 1.10* 12/26/2014 1831   CREATININE 1.0 12/22/2014 1529   CALCIUM 9.3 12/26/2014 1831   CALCIUM 9.3 12/22/2014 1529   PROT 8.5* 12/22/2014 1529   PROT 7.4 10/09/2014 1524   ALBUMIN 3.8 12/22/2014 1529   ALBUMIN 3.1* 10/09/2014 1524   AST 17 12/22/2014 1529   AST 25 10/09/2014 1524   ALT 12 12/22/2014 1529   ALT 21 10/09/2014 1524   ALKPHOS 69 12/22/2014 1529   ALKPHOS 48 10/09/2014 1524   BILITOT 0.41 12/22/2014 1529   BILITOT 0.5 10/09/2014 1524   GFRNONAA 46* 12/26/2014 1831   GFRAA 53* 12/26/2014 1831    No results for input(s): CKTOTAL, CKMB, CKMBINDEX, TROPONINI in the last 168 hours. BNP (last 3 results)  Recent Labs  05/23/14 1445 08/13/14 1216 10/09/14 1524  BNP 537.1* 150.0* 537.4*    ProBNP (last 3 results)  Recent Labs  02/08/14 1727  PROBNP 121.3     Radiological Exams on Admission: Dg Chest 2 View  12/26/2014   CLINICAL DATA:  Chest pain.  No shortness of breath.  EXAM: CHEST  2 VIEW  COMPARISON:  10/09/2014  FINDINGS: Previous median sternotomy and CABG procedure. Aortic atherosclerosis. The heart size is mildly enlarged. No pleural effusion. Spondylosis noted within the thoracic spine.  IMPRESSION: 1. Cardiac enlargement. 2. Aortic atherosclerosis.   Electronically Signed   By: Kerby Moors M.D.   On: 12/26/2014 19:05   EKG: Independently reviewed. normal sinus rhythm, nonspecific ST and T waves changes.  Assessment/Plan 1. Chest pain Patient presents with numbness of chest pain. EKG unremarkable. Telemetry negative. Serial troponins are negative as well. We'll continue to monitor her closely in the hospital. Patient had a VQ scan no for similar complaints 2 month ago which was also negative. We  will follow serial  troponin. Monitor her on telemetry and get limited echocardiogram in the morning.  2.Dyslipidemia Continue home medications.  3 GERD (gastroesophageal reflux disease) Active GI cocktail. Continue home PPI.  4 COPD (chronic obstructive pulmonary disease) Continue home inhalers.  5 Hypertension Blood pressure mildly elevated. Will use when necessary hydralazine. Also add nitroglycerin ointment. If the pain does not improve then we will put her on nitroglycerin drip. Patient is currently in stepdown unit.  6 CAD (coronary artery disease) of artery bypass graft, atretic LIMA Continue aspirin increase dose to 325. Continue Xarelto.  7 Multiple myeloma Currently CBC appears stable. Continue close monitoring.  8 A-fib, CHA2DS2-VASc score 7 Continue anticoagulation  Nutrition: Nothing by mouth after midnight DVT Prophylaxis: mechanical compression device  Advance goals of care discussion: DNR/DNI as per my discussion  Disposition: Admitted as observation, stepdown  unit. Author: Berle Mull, MD [facility] Pager: [phone] 12/27/2014  If 7PM-7AM, please contact night-coverage [email or web address] Password [email or web address]

## 2014-12-27 NOTE — Discharge Instructions (Signed)
Information on my medicine - XARELTO (Rivaroxaban)  This medication education was reviewed with me or my healthcare representative as part of my discharge preparation.  The pharmacist that spoke with me during my hospital stay was:  Romona Curls, [facility]  Why was Xarelto prescribed for you? Xarelto was prescribed for you to reduce the risk of a blood clot forming that can cause a stroke if you have a medical condition called atrial fibrillation (a type of irregular heartbeat).  What do you need to know about xarelto ? Take your Xarelto ONCE DAILY at the same time every day with your evening meal. If you have difficulty swallowing the tablet whole, you may crush it and mix in applesauce just prior to taking your dose.  Take Xarelto exactly as prescribed by your doctor and DO NOT stop taking Xarelto without talking to the doctor who prescribed the medication.  Stopping without other stroke prevention medication to take the place of Xarelto may increase your risk of developing a clot that causes a stroke.  Refill your prescription before you run out.  After discharge, you should have regular check-up appointments with your healthcare provider that is prescribing your Xarelto.  In the future your dose may need to be changed if your kidney function or weight changes by a significant amount.  What do you do if you miss a dose? If you are taking Xarelto ONCE DAILY and you miss a dose, take it as soon as you remember on the same day then continue your regularly scheduled once daily regimen the next day. Do not take two doses of Xarelto at the same time or on the same day.   Important Safety Information A possible side effect of Xarelto is bleeding. You should call your healthcare provider right away if you experience any of the following: ? Bleeding from an injury or your nose that does not stop. ? Unusual colored urine (red or dark brown) or unusual colored stools (red or black). ? Unusual  bruising for unknown reasons. ? A serious fall or if you hit your head (even if there is no bleeding).  Some medicines may interact with Xarelto and might increase your risk of bleeding while on Xarelto. To help avoid this, consult your healthcare provider or pharmacist prior to using any new prescription or non-prescription medications, including herbals, vitamins, non-steroidal anti-inflammatory drugs (NSAIDs) and supplements.  This website has more information on Xarelto: [email or web address]

## 2014-12-27 NOTE — Consult Note (Signed)
CONSULT NOTE  Date: 12/27/2014               Patient Name:  [name] MRN: [id number]  DOB: [birth year] Age / Sex: 81 y.o., female        PCP: [name] Primary Cardiologist: Tamala Julian             Referring Physician: Broadus John              Reason for Consult:  chest pain            History of Present Illness: Patient is a 81 y.o. female with a PMHx of COPD, coronary disease, hypertension, dyslipidemia, A. fib on anticoagulation, GERD, multiple myeloma , who was admitted to [facility] on 12/26/2014 for evaluation of chest pain   Pain is a stabbing pain ,  Sharp. Left sided pain ,   Has been present for hours.  Despite that , Troponin levels are negative  Pain is not worsened or eased by changed of position, eating , drinking, taking a deep breath .  No MSK tenderness.    Had a cardiac cath in Nov. 2015>  It showed. :  Total occlusion of the native RCA proximally prior to remotely placement RCA stent, with mild proximal 30% LAD stenosis and 30% distal circumflex stenoses.  Atretic LIMA graft to the mid LAD.  Patent saphenous vein graft supplying the distal RCA.     Medications: Outpatient medications: Prescriptions prior to admission  Medication Sig Dispense Refill Last Dose  . acetaminophen (TYLENOL) 325 MG tablet Take 2 tablets (650 mg total) by mouth every 6 (six) hours as needed for mild pain (or Fever >/= 101). 60 tablet 0 Past Week at Unknown time  . acyclovir (ZOVIRAX) 400 MG tablet Take 1 tablet (400 mg total) by mouth daily. 30 tablet 3 12/25/2014 at Unknown time  . albuterol-ipratropium (COMBIVENT) 18-103 MCG/ACT inhaler Inhale 1-2 puffs into the lungs every 4 (four) hours as needed for wheezing or shortness of breath.   12/25/2014 at Unknown time  . aspirin EC 81 MG tablet Take 325 mg by mouth daily.    12/26/2014 at Unknown time  . cholecalciferol (VITAMIN D) 1000 UNITS tablet Take 1,000 Units by mouth every morning.    12/25/2014 at Unknown time  .  dexamethasone (DECADRON) 4 MG tablet Take 5 tablets every week before chemotherapy injection. 40 tablet 3 Past Week at Unknown time  . ezetimibe (ZETIA) 10 MG tablet Take 1 tablet (10 mg total) by mouth at bedtime.   12/25/2014 at Unknown time  . feeding supplement, ENSURE ENLIVE, (ENSURE ENLIVE) LIQD Take 237 mLs by mouth 3 (three) times daily between meals. 237 mL 12 Past Month at Unknown time  . isosorbide mononitrate (IMDUR) 30 MG 24 hr tablet Take 1 tablet (30 mg total) by mouth daily. 30 tablet 3 12/25/2014 at Unknown time  . metoprolol succinate (TOPROL-XL) 25 MG 24 hr tablet Take 1 tablet (25 mg total) by mouth daily. 30 tablet 3 12/25/2014 at 0800  . nitroGLYCERIN (NITROSTAT) 0.4 MG SL tablet Place 0.4 mg under the tongue every 5 (five) minutes as needed for chest pain.   12/26/2014 at Unknown time  . Omega-3 Fatty Acids (FISH OIL) 1200 MG CAPS Take 1,200-2,400 mg by mouth 2 (two) times daily. Take 2 capsules (2400 mg) every morning and 1 capsule (1200 mg) every night   12/25/2014 at Unknown time  . pantoprazole (PROTONIX) 40 MG tablet Take 1 tablet (40 mg  total) by mouth 2 (two) times daily.  0 12/25/2014 at Unknown time  . polyethylene glycol powder (GLYCOLAX/MIRALAX) powder Take 17 g by mouth every morning. Mix with 8 oz liquid and drink  0 Past Month at Unknown time  . prochlorperazine (COMPAZINE) 10 MG tablet Take 10 mg by mouth every 8 (eight) hours as needed for nausea.   0 Past Month at Unknown time  . Rivaroxaban (XARELTO) 15 MG TABS tablet Take 1 tablet (15 mg total) by mouth daily with supper. 30 tablet 3 12/25/2014 at Unknown time  . tiotropium (SPIRIVA) 18 MCG inhalation capsule Place 18 mcg into inhaler and inhale daily as needed (shortness of breath).    12/25/2014 at Unknown time    Current medications: Current Facility-Administered Medications  Medication Dose Route Frequency Sinda Leedom Last Rate Last Dose  . acetaminophen (TYLENOL) tablet 650 mg  650 mg Oral Q4H PRN Lavina Hamman,  MD      . acyclovir (ZOVIRAX) tablet 400 mg  400 mg Oral Daily Lavina Hamman, MD      . aspirin EC tablet 325 mg  325 mg Oral Daily Lavina Hamman, MD      . ezetimibe (ZETIA) tablet 10 mg  10 mg Oral QHS Lavina Hamman, MD      . gi cocktail (Maalox,Lidocaine,Donnatal)  30 mL Oral QID PRN Lavina Hamman, MD   30 mL at 12/27/14 0231  . hydrALAZINE (APRESOLINE) injection 10 mg  10 mg Intravenous Q4H PRN Lavina Hamman, MD   10 mg at 12/27/14 0447  . ipratropium-albuterol (DUONEB) 0.5-2.5 (3) MG/3ML nebulizer solution 3 mL  3 mL Nebulization Q4H PRN Lavina Hamman, MD      . isosorbide mononitrate (IMDUR) 24 hr tablet 30 mg  30 mg Oral Daily Lavina Hamman, MD      . metoprolol succinate (TOPROL-XL) 24 hr tablet 25 mg  25 mg Oral Daily Lavina Hamman, MD      . morphine 2 MG/ML injection 2-4 mg  2-4 mg Intravenous Q2H PRN Lavina Hamman, MD   4 mg at 12/27/14 0534  . nitroGLYCERIN (NITROGLYN) 2 % ointment 0.5 inch  0.5 inch Topical 4 times per day Lavina Hamman, MD   0.5 inch at 12/27/14 0533  . nitroGLYCERIN (NITROSTAT) SL tablet 0.4 mg  0.4 mg Sublingual Q5 min PRN Sherwood Gambler, MD   0.4 mg at 12/26/14 2217  . ondansetron (ZOFRAN) injection 4 mg  4 mg Intravenous Q6H PRN Lavina Hamman, MD      . pantoprazole (PROTONIX) EC tablet 40 mg  40 mg Oral BID Lavina Hamman, MD   40 mg at 12/27/14 0228  . polyethylene glycol (MIRALAX / GLYCOLAX) packet 17 g  17 g Oral Daily Lavina Hamman, MD      . Rivaroxaban Alveda Reasons) tablet 15 mg  15 mg Oral Q supper Lavina Hamman, MD      . tiotropium [facility]) inhalation capsule 18 mcg  18 mcg Inhalation Daily PRN Lavina Hamman, MD         Allergies  Allergen Reactions  . Lexapro [Escitalopram Oxalate] Other (See Comments)    Fatigue   . Soma [Carisoprodol] Other (See Comments)    Fatigue   . Wellbutrin [Bupropion] Other (See Comments)    "couldn't function and take it"  . Albuterol Sulfate Other (See Comments)    Albuterol HFA inhaler caused  nervousness   . Codeine Hives  .  Levaquin [Levofloxacin In D5w] Other (See Comments)    Unknown allergic reaction  . Plavix [Clopidogrel Bisulfate] Other (See Comments)    Unknown reaction  . Statins      Past Medical History  Diagnosis Date  . COPD (chronic obstructive pulmonary disease)   . Gout   . Coronary atherosclerosis of native coronary artery 02/05/2012  . Hypertension   . High blood cholesterol   . Heart murmur     "all my life" (02/05/2012)  . Anginal pain   . Myocardial infarction 1990's; 2000's    "2 total, I think" (02/05/2012)  . Pneumonia     "several times" (02/05/2012)  . Chronic bronchitis   . Shortness of breath     "sometimes when I'm laying down; always when I do too much" (02/05/2012)  . History of blood transfusion   . External bleeding hemorrhoids     "act up at times" (02/05/2012)  . Chronic back pain     "all over" (02/05/2012)  . Depression   . CAD (coronary artery disease) of artery bypass graft 02/09/14    atretic LIMA  . Multiple myeloma 09/07/2014  . A-fib 10/09/2014    Past Surgical History  Procedure Laterality Date  . Appendectomy  ? 1970's  . Ectopic pregnancy surgery  1970's  . Abdominal hysterectomy  ? 1970's    "partial 1st time; complete 2nd" (02/05/2012)  . Cholecystectomy  ~ 2010  . Cataract extraction w/ intraocular lens  implant, bilateral  [phone]  . Coronary angioplasty with stent placement  2005  . Coronary artery bypass graft  2005    CABG X2  . Cardiac catheterization  02/09/14    atretic LIMA,  patent VG-dRCA and Total occlusion of the native RCA proximally prior to remotely placement RCA stent, with mild proximal 30% LAD stenosis and 30% distal circumflex stenoses.  EF 55%.  . Left heart catheterization with coronary/graft angiogram N/A 02/09/2014    Procedure: LEFT HEART CATHETERIZATION WITH Beatrix Fetters;  Surgeon: Troy Sine, MD;  Location: [facility] CATH LAB;  Service: Cardiovascular;  Laterality: N/A;    . Esophagogastroduodenoscopy (egd) with propofol Left 05/22/2014    Procedure: ESOPHAGOGASTRODUODENOSCOPY (EGD) WITH PROPOFOL;  Surgeon: Arta Silence, MD;  Location: [facility] ENDOSCOPY;  Service: Endoscopy;  Laterality: Left;    Family History  Problem Relation Age of Onset  . Anemia Neg Hx   . Arrhythmia Neg Hx   . Asthma Neg Hx   . Clotting disorder Neg Hx   . Fainting Neg Hx   . Heart attack Neg Hx   . Heart disease Neg Hx   . Heart failure Neg Hx   . Hyperlipidemia Neg Hx   . Hypertension Neg Hx   . Aneurysm    . CVA    . CVA Mother   . Aneurysm Father     Social History:  reports that she quit smoking about 3 years ago. Her smoking use included Cigarettes. She has a 50 pack-year smoking history. She has never used smokeless tobacco. She reports that she drinks alcohol. She reports that she does not use illicit drugs.   Review of Systems: Constitutional:  denies fever, chills, diaphoresis, appetite change and fatigue.  HEENT: denies photophobia, eye pain, redness, hearing loss, ear pain, congestion, sore throat, rhinorrhea, sneezing, neck pain, neck stiffness and tinnitus.  Respiratory: denies SOB, DOE, cough, chest tightness, and wheezing.  Cardiovascular: admits to chest pain,    Gastrointestinal: denies nausea, vomiting, abdominal pain, diarrhea, constipation, blood in  stool.  Genitourinary: denies dysuria, urgency, frequency, hematuria, flank pain and difficulty urinating.  Musculoskeletal: denies  myalgias, back pain, joint swelling, arthralgias and gait problem.   Skin: denies pallor, rash and wound.  Neurological: denies dizziness, seizures, syncope, weakness, light-headedness, numbness and headaches.   Hematological: denies adenopathy, easy bruising, personal or family bleeding history.  Psychiatric/ Behavioral: denies suicidal ideation, mood changes, confusion, nervousness, sleep disturbance and agitation.    Physical Exam: BP 144/45 mmHg  Pulse 57  Temp(Src)  97.8 F (36.6 C) (Oral)  Resp 18  Ht $R'5\' 2"'dh$  (1.575 m)  Wt 60.056 kg (132 lb 6.4 oz)  BMI 24.21 kg/m2  SpO2 98%  Wt Readings from Last 3 Encounters:  12/26/14 60.056 kg (132 lb 6.4 oz)  12/01/14 58.695 kg (129 lb 6.4 oz)  11/10/14 59.104 kg (130 lb 4.8 oz)    General: Vital signs reviewed and noted. Well-developed, well-nourished, in no acute distress; alert,   Head: Normocephalic, atraumatic, sclera anicteric,   Neck: Supple. Negative for carotid bruits. No JVD   Lungs:  Clear bilaterally, no  wheezes, rales, or rhonchi. Breathing is normal   Heart: RRR with S1 S2. No murmurs, rubs, or gallops   Abdomen/ GI :  Soft, non-tender, non-distended with normoactive bowel sounds. No hepatomegaly. No rebound/guarding. No obvious abdominal masses   MSK: Strength and the appear normal for age.   Extremities: No clubbing or cyanosis. No edema.  Distal pedal pulses are 2+ and equal   Neurologic:  CN are grossly intact,  No obvious motor or sensory defect.  Alert and oriented X 3. Moves all extremities spontaneously.  Psych: Responds to questions appropriately with a normal affect.     Lab results: Basic Metabolic Panel:  Recent Labs Lab 12/22/14 1529 12/26/14 1831 12/27/14 0357  NA 139 135 136  K 4.4 4.0 4.0  CL  --  105 104  CO2 $Re'26 24 26  'JKP$ GLUCOSE 124 112* 99  BUN 21.1 24* 24*  CREATININE 1.0 1.10* 0.91  CALCIUM 9.3 9.3 8.9    Liver Function Tests:  Recent Labs Lab 12/22/14 1529 12/27/14 0357  AST 17 23  ALT 12 18  ALKPHOS 69 56  BILITOT 0.41 0.5  PROT 8.5* 6.7  ALBUMIN 3.8 3.2*   No results for input(s): LIPASE, AMYLASE in the last 168 hours. No results for input(s): AMMONIA in the last 168 hours.  CBC:  Recent Labs Lab 12/22/14 1529 12/26/14 1831 12/27/14 0357  WBC 5.4 4.5 4.0  NEUTROABS 4.3  --  1.3*  HGB 10.6* 10.1* 9.5*  HCT 31.6* 31.5* 29.4*  MCV 102.2* 102.3* 102.1*  PLT 152 134* 112*    Cardiac Enzymes:  Recent Labs Lab 12/27/14 0357  12/27/14 0754  TROPONINI <0.03 <0.03    BNP: Invalid input(s): POCBNP  CBG: No results for input(s): GLUCAP in the last 168 hours.  Coagulation Studies: No results for input(s): LABPROT, INR in the last 72 hours.   Other results: Personal review of EKG shows :  -  Sinus brady .  TWI in the lateral leads.  The TWI is new from previous studies.   Imaging: Dg Chest 2 View  12/26/2014   CLINICAL DATA:  Chest pain.  No shortness of breath.  EXAM: CHEST  2 VIEW  COMPARISON:  10/09/2014  FINDINGS: Previous median sternotomy and CABG procedure. Aortic atherosclerosis. The heart size is mildly enlarged. No pleural effusion. Spondylosis noted within the thoracic spine.  IMPRESSION: 1. Cardiac enlargement. 2. Aortic atherosclerosis.  Electronically Signed   By: Kerby Moors M.D.   On: 12/26/2014 19:05       Assessment & Plan:  1. Chest discomfort. :  Has had recurrent CP.  Troponin levels are negative.  TWI in the lateral leads is new. Will order a Lexiscan myoview  Has known occlusion of the RCA.  I would expect her to have an inferior defect.   She may need a cath if she has significant ischemia.      Thayer Headings, Brooke Bonito., MD, [facility] 12/27/2014, 8:53 AM Office - [phone] Pager [phone]

## 2014-12-27 NOTE — Progress Notes (Signed)
Patient being discharge per MD order, All discharge instructions given to both patient and daughter, verbalized understanding.

## 2014-12-27 NOTE — Progress Notes (Signed)
Patient still complaining of left side chest pain and BP 203/80. Notified Dr. Posey Pronto. Will continue to monitor and treat.

## 2014-12-27 NOTE — Progress Notes (Addendum)
     The patient was seen in nuclear medicine for a lexiscan myoview. She tolerated the procedure well although she did have some N/V. No acute ST or TW changes on ECG. Await nuclear images   Perry Mount PA-C  [facility]

## 2014-12-27 NOTE — Progress Notes (Signed)
Paged Admissions with [facility] for orders

## 2014-12-27 NOTE — Discharge Summary (Signed)
Physician Discharge Summary  [name] KMQ:[id number] DOB: [birth year] DOA: 12/26/2014  PCP: [name], MD  Admit date: 12/26/2014 Discharge date: 12/27/2014  Time spent: 45 minutes  Recommendations for Outpatient Follow-up:  1. PCP Dr.Hussain in 1-2weeks 2. DR.Smith in 1 month  Discharge Diagnoses:  Principal Problem:   Chest pain Active Problems:   Dyslipidemia   GERD (gastroesophageal reflux disease)   COPD (chronic obstructive pulmonary disease)   Hypertension   CAD (coronary artery disease) of artery bypass graft, atretic LIMA   Multiple myeloma   A-fib, CHA2DS2-VASc score 7   Discharge Condition: stable  Diet recommendation: heart healthy  Filed Weights   12/26/14 2350  Weight: 60.056 kg (132 lb 6.4 oz)    History of present illness:  Chief Complaint: Chest pain  HPI: [name] is a 81 y.o. female with Past medical history of COPD, coronary disease, hypertension, dyslipidemia, A. fib on anticoagulation, GERD, multiple myeloma. Patient presented with complaints of chest pain, was located on the left side.   Hospital Course:  Atypical chest pain -resolved -has known CAD and RCA stent last year -EKG with TWI in lateral leads, Troponins x3 negative -Seen By Cardiology in consultation and underwent Myoview which was negative for inducible ischemia. -stable for discharge home and advised FU with Primary cardiologist Dr.Smith  Rest of her chromic medical problems were stable  Procedures:  Myoview  Consultations:  Cardiology  Discharge Exam: Filed Vitals:   12/27/14 1234  BP:   Pulse: 55  Temp:   Resp:     General: AAOx3 Cardiovascular:S1S2/RRR Respiratory: CTAB  Discharge Instructions   Discharge Instructions    Diet - low sodium heart healthy    Complete by:  As directed      Increase activity slowly    Complete by:  As directed           Current Discharge Medication List    CONTINUE these medications which have NOT  CHANGED   Details  acetaminophen (TYLENOL) 325 MG tablet Take 2 tablets (650 mg total) by mouth every 6 (six) hours as needed for mild pain (or Fever >/= 101). Qty: 60 tablet, Refills: 0    acyclovir (ZOVIRAX) 400 MG tablet Take 1 tablet (400 mg total) by mouth daily. Qty: 30 tablet, Refills: 3    albuterol-ipratropium (COMBIVENT) 18-103 MCG/ACT inhaler Inhale 1-2 puffs into the lungs every 4 (four) hours as needed for wheezing or shortness of breath.    aspirin EC 81 MG tablet Take 325 mg by mouth daily.     cholecalciferol (VITAMIN D) 1000 UNITS tablet Take 1,000 Units by mouth every morning.     dexamethasone (DECADRON) 4 MG tablet Take 5 tablets every week before chemotherapy injection. Qty: 40 tablet, Refills: 3    ezetimibe (ZETIA) 10 MG tablet Take 1 tablet (10 mg total) by mouth at bedtime.    feeding supplement, ENSURE ENLIVE, (ENSURE ENLIVE) LIQD Take 237 mLs by mouth 3 (three) times daily between meals. Qty: 237 mL, Refills: 12    isosorbide mononitrate (IMDUR) 30 MG 24 hr tablet Take 1 tablet (30 mg total) by mouth daily. Qty: 30 tablet, Refills: 3    metoprolol succinate (TOPROL-XL) 25 MG 24 hr tablet Take 1 tablet (25 mg total) by mouth daily. Qty: 30 tablet, Refills: 3    nitroGLYCERIN (NITROSTAT) 0.4 MG SL tablet Place 0.4 mg under the tongue every 5 (five) minutes as needed for chest pain.    Omega-3 Fatty Acids (FISH OIL) 1200 MG  CAPS Take 1,200-2,400 mg by mouth 2 (two) times daily. Take 2 capsules (2400 mg) every morning and 1 capsule (1200 mg) every night    pantoprazole (PROTONIX) 40 MG tablet Take 1 tablet (40 mg total) by mouth 2 (two) times daily. Refills: 0    polyethylene glycol powder (GLYCOLAX/MIRALAX) powder Take 17 g by mouth every morning. Mix with 8 oz liquid and drink Refills: 0    prochlorperazine (COMPAZINE) 10 MG tablet Take 10 mg by mouth every 8 (eight) hours as needed for nausea.  Refills: 0    Rivaroxaban (XARELTO) 15 MG TABS tablet  Take 1 tablet (15 mg total) by mouth daily with supper. Qty: 30 tablet, Refills: 3    tiotropium (SPIRIVA) 18 MCG inhalation capsule Place 18 mcg into inhaler and inhale daily as needed (shortness of breath).        Allergies  Allergen Reactions  . Lexapro [Escitalopram Oxalate] Other (See Comments)    Fatigue   . Soma [Carisoprodol] Other (See Comments)    Fatigue   . Wellbutrin [Bupropion] Other (See Comments)    "couldn't function and take it"  . Albuterol Sulfate Other (See Comments)    Albuterol HFA inhaler caused nervousness   . Codeine Hives  . Levaquin [Levofloxacin In D5w] Other (See Comments)    Unknown allergic reaction  . Plavix [Clopidogrel Bisulfate] Other (See Comments)    Unknown reaction  . Statins    Follow-up Information    Follow up with [name], MD. Schedule an appointment as soon as possible for a visit in 1 week.   Specialty:  Internal Medicine   Contact information:   [address] [place] [address] [phone]        The results of significant diagnostics from this hospitalization (including imaging, microbiology, ancillary and laboratory) are listed below for reference.    Significant Diagnostic Studies: Dg Chest 2 View  12/26/2014   CLINICAL DATA:  Chest pain.  No shortness of breath.  EXAM: CHEST  2 VIEW  COMPARISON:  10/09/2014  FINDINGS: Previous median sternotomy and CABG procedure. Aortic atherosclerosis. The heart size is mildly enlarged. No pleural effusion. Spondylosis noted within the thoracic spine.  IMPRESSION: 1. Cardiac enlargement. 2. Aortic atherosclerosis.   Electronically Signed   By: Kerby Moors M.D.   On: 12/26/2014 19:05   Nm Myocar Multi W/spect W/wall Motion / Ef  12/27/2014   CLINICAL DATA:  Coronary artery disease. CABG. Chest pain on 12/26/2014.  EXAM: MYOCARDIAL IMAGING WITH SPECT (REST AND PHARMACOLOGIC-STRESS)  GATED LEFT VENTRICULAR WALL MOTION STUDY  LEFT VENTRICULAR EJECTION FRACTION   TECHNIQUE: Standard myocardial SPECT imaging was performed after resting intravenous injection of 10 mCi Tc-20m sestamibi. Subsequently, intravenous infusion of Lexiscan was performed under the supervision of the Cardiology staff. At peak effect of the drug, 30 mCi Tc-13m sestamibi was injected intravenously and standard myocardial SPECT imaging was performed. Quantitative gated imaging was also performed to evaluate left ventricular wall motion, and estimate left ventricular ejection fraction.  COMPARISON:  Chest radiograph 12/26/2014.  Chest CT 04/09/2014.  FINDINGS: Perfusion: No decreased activity in the left ventricle on stress imaging to suggest reversible ischemia or infarction.  Wall Motion: Normal left ventricular wall motion. No left ventricular dilation.  Left Ventricular Ejection Fraction: 57 %  End diastolic volume 72 ml  End systolic volume 31 ml  IMPRESSION: 1. No reversible ischemia or infarction.  2. Normal left ventricular wall motion.  3. Left ventricular ejection fraction 57%  4. Low-risk stress test findings*.  *2012 Appropriate Use Criteria for Coronary Revascularization Focused Update: J Am Coll Cardiol. 2500;37(0):488-891. http://content.airportbarriers.com.aspx?articleid=1201161   Electronically Signed   By: Dereck Ligas M.D.   On: 12/27/2014 14:25    Microbiology: Recent Results (from the past 240 hour(s))  MRSA PCR Screening     Status: None   Collection Time: 12/27/14 12:19 AM  Result Value Ref Range Status   MRSA by PCR NEGATIVE NEGATIVE Final    Comment:        The GeneXpert MRSA Assay (FDA approved for NASAL specimens only), is one component of a comprehensive MRSA colonization surveillance program. It is not intended to diagnose MRSA infection nor to guide or monitor treatment for MRSA infections.      Labs: Basic Metabolic Panel:  Recent Labs Lab 12/22/14 1529 12/26/14 1831 12/27/14 0357  NA 139 135 136  K 4.4 4.0 4.0  CL  --  105 104  CO2 $Re'26 24  26  'Ewo$ GLUCOSE 124 112* 99  BUN 21.1 24* 24*  CREATININE 1.0 1.10* 0.91  CALCIUM 9.3 9.3 8.9   Liver Function Tests:  Recent Labs Lab 12/22/14 1529 12/27/14 0357  AST 17 23  ALT 12 18  ALKPHOS 69 56  BILITOT 0.41 0.5  PROT 8.5* 6.7  ALBUMIN 3.8 3.2*   No results for input(s): LIPASE, AMYLASE in the last 168 hours. No results for input(s): AMMONIA in the last 168 hours. CBC:  Recent Labs Lab 12/22/14 1529 12/26/14 1831 12/27/14 0357  WBC 5.4 4.5 4.0  NEUTROABS 4.3  --  1.3*  HGB 10.6* 10.1* 9.5*  HCT 31.6* 31.5* 29.4*  MCV 102.2* 102.3* 102.1*  PLT 152 134* 112*   Cardiac Enzymes:  Recent Labs Lab 12/27/14 0357 12/27/14 0754 12/27/14 1439  TROPONINI <0.03 <0.03 <0.03   BNP: BNP (last 3 results)  Recent Labs  05/23/14 1445 08/13/14 1216 10/09/14 1524  BNP 537.1* 150.0* 537.4*    ProBNP (last 3 results)  Recent Labs  02/08/14 1727  PROBNP 121.3    CBG: No results for input(s): GLUCAP in the last 168 hours.     SignedDomenic Polite  [facility] 12/27/2014, 5:05 PM

## 2014-12-29 ENCOUNTER — Other Ambulatory Visit (HOSPITAL_BASED_OUTPATIENT_CLINIC_OR_DEPARTMENT_OTHER): Payer: Medicare Other

## 2014-12-29 ENCOUNTER — Ambulatory Visit (HOSPITAL_BASED_OUTPATIENT_CLINIC_OR_DEPARTMENT_OTHER): Payer: Medicare Other

## 2014-12-29 VITALS — BP 116/57 | HR 57 | Temp 96.2°F | Resp 18

## 2014-12-29 DIAGNOSIS — C9000 Multiple myeloma not having achieved remission: Secondary | ICD-10-CM

## 2014-12-29 DIAGNOSIS — Z5112 Encounter for antineoplastic immunotherapy: Secondary | ICD-10-CM

## 2014-12-29 LAB — COMPREHENSIVE METABOLIC PANEL (CC13)
ALT: 17 U/L (ref 0–55)
ANION GAP: 8 meq/L (ref 3–11)
AST: 20 U/L (ref 5–34)
Albumin: 3.6 g/dL (ref 3.5–5.0)
Alkaline Phosphatase: 78 U/L (ref 40–150)
BILIRUBIN TOTAL: 0.39 mg/dL (ref 0.20–1.20)
BUN: 22.4 mg/dL (ref 7.0–26.0)
CALCIUM: 8.9 mg/dL (ref 8.4–10.4)
CHLORIDE: 107 meq/L (ref 98–109)
CO2: 23 meq/L (ref 22–29)
CREATININE: 1.2 mg/dL — AB (ref 0.6–1.1)
EGFR: 43 mL/min/{1.73_m2} — AB (ref >90)
Glucose: 204 mg/dl — ABNORMAL HIGH (ref 70–140)
Potassium: 4.4 mEq/L (ref 3.5–5.1)
Sodium: 138 mEq/L (ref 136–145)
Total Protein: 8 g/dL (ref 6.4–8.3)

## 2014-12-29 LAB — CBC WITH DIFFERENTIAL/PLATELET
BASO%: 0.3 % (ref 0.0–2.0)
BASOS ABS: 0 10*3/uL (ref 0.0–0.1)
EOS%: 0 % (ref 0.0–7.0)
Eosinophils Absolute: 0 10*3/uL (ref 0.0–0.5)
HEMATOCRIT: 29.7 % — AB (ref 34.8–46.6)
HGB: 10 g/dL — ABNORMAL LOW (ref 11.6–15.9)
LYMPH#: 0.9 10*3/uL (ref 0.9–3.3)
LYMPH%: 19 % (ref 14.0–49.7)
MCH: 34.2 pg — AB (ref 25.1–34.0)
MCHC: 33.6 g/dL (ref 31.5–36.0)
MCV: 101.7 fL — ABNORMAL HIGH (ref 79.5–101.0)
MONO#: 0 10*3/uL — ABNORMAL LOW (ref 0.1–0.9)
MONO%: 0.8 % (ref 0.0–14.0)
NEUT%: 79.9 % — ABNORMAL HIGH (ref 38.4–76.8)
NEUTROS ABS: 3.9 10*3/uL (ref 1.5–6.5)
Platelets: 157 10*3/uL (ref 145–400)
RBC: 2.92 10*6/uL — ABNORMAL LOW (ref 3.70–5.45)
RDW: 16.1 % — AB (ref 11.2–14.5)
WBC: 4.8 10*3/uL (ref 3.9–10.3)

## 2014-12-29 MED ORDER — ONDANSETRON HCL 8 MG PO TABS
8.0000 mg | ORAL_TABLET | Freq: Once | ORAL | Status: AC
  Administered 2014-12-29: 8 mg via ORAL

## 2014-12-29 MED ORDER — BORTEZOMIB CHEMO SQ INJECTION 3.5 MG (2.5MG/ML)
1.3000 mg/m2 | Freq: Once | INTRAMUSCULAR | Status: AC
  Administered 2014-12-29: 2 mg via SUBCUTANEOUS
  Filled 2014-12-29: qty 2

## 2014-12-29 MED ORDER — ONDANSETRON HCL 8 MG PO TABS
ORAL_TABLET | ORAL | Status: AC
  Filled 2014-12-29: qty 1

## 2014-12-29 NOTE — Patient Instructions (Signed)
[facility] Discharge Instructions for Patients Receiving Chemotherapy  Today you received the following chemotherapy agents:  Velcade  To help prevent nausea and vomiting after your treatment, we encourage you to take your nausea medication as prescribed.   If you develop nausea and vomiting that is not controlled by your nausea medication, call the clinic.   BELOW ARE SYMPTOMS THAT SHOULD BE REPORTED IMMEDIATELY:  *FEVER GREATER THAN 100.5 F  *CHILLS WITH OR WITHOUT FEVER  NAUSEA AND VOMITING THAT IS NOT CONTROLLED WITH YOUR NAUSEA MEDICATION  *UNUSUAL SHORTNESS OF BREATH  *UNUSUAL BRUISING OR BLEEDING  TENDERNESS IN MOUTH AND THROAT WITH OR WITHOUT PRESENCE OF ULCERS  *URINARY PROBLEMS  *BOWEL PROBLEMS  UNUSUAL RASH Items with * indicate a potential emergency and should be followed up as soon as possible.  Feel free to call the clinic you have any questions or concerns. The clinic phone number is [phone].  Please show the CHEMO ALERT CARD at check-in to the Emergency Department and triage nurse.   

## 2015-01-02 LAB — SPEP & IFE WITH QIG
ABNORMAL PROTEIN BAND1: 1.7 g/dL
ALPHA-1-GLOBULIN: 0.3 g/dL (ref 0.2–0.3)
ALPHA-2-GLOBULIN: 0.7 g/dL (ref 0.5–0.9)
Albumin ELP: 4.2 g/dL (ref 3.8–4.8)
Beta 2: 0.2 g/dL (ref 0.2–0.5)
Beta Globulin: 0.4 g/dL (ref 0.4–0.6)
GAMMA GLOBULIN: 1.9 g/dL — AB (ref 0.8–1.7)
IGG (IMMUNOGLOBIN G), SERUM: 2290 mg/dL — AB (ref 690–1700)
IgA: 18 mg/dL — ABNORMAL LOW (ref 69–380)
TOTAL PROTEIN, SERUM ELECTROPHOR: 7.7 g/dL (ref 6.1–8.1)

## 2015-01-02 LAB — KAPPA/LAMBDA LIGHT CHAINS
Kappa free light chain: 5.51 mg/dL — ABNORMAL HIGH (ref 0.33–1.94)
Kappa:Lambda Ratio: 30.61 — ABNORMAL HIGH (ref 0.26–1.65)
LAMBDA FREE LGHT CHN: 0.18 mg/dL — AB (ref 0.57–2.63)

## 2015-01-05 ENCOUNTER — Ambulatory Visit (HOSPITAL_BASED_OUTPATIENT_CLINIC_OR_DEPARTMENT_OTHER): Payer: Medicare Other

## 2015-01-05 ENCOUNTER — Telehealth: Payer: Self-pay | Admitting: Oncology

## 2015-01-05 ENCOUNTER — Other Ambulatory Visit (HOSPITAL_BASED_OUTPATIENT_CLINIC_OR_DEPARTMENT_OTHER): Payer: Medicare Other

## 2015-01-05 ENCOUNTER — Ambulatory Visit (HOSPITAL_BASED_OUTPATIENT_CLINIC_OR_DEPARTMENT_OTHER): Payer: Medicare Other | Admitting: Oncology

## 2015-01-05 VITALS — BP 155/55 | HR 62 | Temp 97.8°F | Resp 18 | Ht 62.0 in | Wt 133.9 lb

## 2015-01-05 DIAGNOSIS — Z5112 Encounter for antineoplastic immunotherapy: Secondary | ICD-10-CM | POA: Diagnosis not present

## 2015-01-05 DIAGNOSIS — C9000 Multiple myeloma not having achieved remission: Secondary | ICD-10-CM | POA: Diagnosis not present

## 2015-01-05 DIAGNOSIS — D649 Anemia, unspecified: Secondary | ICD-10-CM

## 2015-01-05 LAB — CBC WITH DIFFERENTIAL/PLATELET
BASO%: 0.2 % (ref 0.0–2.0)
BASOS ABS: 0 10*3/uL (ref 0.0–0.1)
EOS ABS: 0 10*3/uL (ref 0.0–0.5)
EOS%: 0.3 % (ref 0.0–7.0)
HEMATOCRIT: 29.4 % — AB (ref 34.8–46.6)
HGB: 9.8 g/dL — ABNORMAL LOW (ref 11.6–15.9)
LYMPH%: 19.2 % (ref 14.0–49.7)
MCH: 33.7 pg (ref 25.1–34.0)
MCHC: 33.3 g/dL (ref 31.5–36.0)
MCV: 101.3 fL — AB (ref 79.5–101.0)
MONO#: 0.1 10*3/uL (ref 0.1–0.9)
MONO%: 2.5 % (ref 0.0–14.0)
NEUT#: 3.5 10*3/uL (ref 1.5–6.5)
NEUT%: 77.8 % — AB (ref 38.4–76.8)
PLATELETS: 146 10*3/uL (ref 145–400)
RBC: 2.9 10*6/uL — AB (ref 3.70–5.45)
RDW: 16 % — ABNORMAL HIGH (ref 11.2–14.5)
WBC: 4.6 10*3/uL (ref 3.9–10.3)
lymph#: 0.9 10*3/uL (ref 0.9–3.3)

## 2015-01-05 LAB — COMPREHENSIVE METABOLIC PANEL (CC13)
ALBUMIN: 3.5 g/dL (ref 3.5–5.0)
ALT: 11 U/L (ref 0–55)
AST: 16 U/L (ref 5–34)
Alkaline Phosphatase: 72 U/L (ref 40–150)
Anion Gap: 7 mEq/L (ref 3–11)
BUN: 17.1 mg/dL (ref 7.0–26.0)
CHLORIDE: 107 meq/L (ref 98–109)
CO2: 26 meq/L (ref 22–29)
Calcium: 9.3 mg/dL (ref 8.4–10.4)
Creatinine: 1.3 mg/dL — ABNORMAL HIGH (ref 0.6–1.1)
EGFR: 40 mL/min/{1.73_m2} — ABNORMAL LOW (ref >90)
GLUCOSE: 127 mg/dL (ref 70–140)
POTASSIUM: 4.8 meq/L (ref 3.5–5.1)
SODIUM: 140 meq/L (ref 136–145)
Total Bilirubin: 0.39 mg/dL (ref 0.20–1.20)
Total Protein: 8.2 g/dL (ref 6.4–8.3)

## 2015-01-05 MED ORDER — BORTEZOMIB CHEMO SQ INJECTION 3.5 MG (2.5MG/ML)
1.3000 mg/m2 | Freq: Once | INTRAMUSCULAR | Status: AC
  Administered 2015-01-05: 2 mg via SUBCUTANEOUS
  Filled 2015-01-05: qty 2

## 2015-01-05 MED ORDER — ONDANSETRON HCL 8 MG PO TABS
8.0000 mg | ORAL_TABLET | Freq: Once | ORAL | Status: AC
  Administered 2015-01-05: 8 mg via ORAL

## 2015-01-05 MED ORDER — ONDANSETRON HCL 8 MG PO TABS
ORAL_TABLET | ORAL | Status: AC
  Filled 2015-01-05: qty 1

## 2015-01-05 NOTE — Progress Notes (Signed)
Hematology and Oncology Follow Up Visit  NICHELLE RENWICK [id number] 05-09-1931 81 y.o. 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 She was found to have IgG level of 3430 and an M spike of 2.9 g/dL on 07/20/2014. Bone marrow biopsy on 08/30/2014 showed 78% plasma cell dyscrasia.   Prior Therapy: Status post packed red cell transfusions during hospitalization in February 2016. This was repeated in May 2016.  She is status post a bone marrow biopsy on 08/30/2014.  Current therapy:  Systemic therapy with Velcade and dexamethasone weekly regimen starting on 09/22/2014.   Interim History: Ms. Bracher presents today for a follow-up visit accompanied by her daughter. Since the last visit, she was hospitalized briefly between 9/20 and 12/27/2014 for chest pain. Her cardiac evaluation including EKG and cardiac enzymes was unrevealing. Her pain have resolved at this time and feel relatively fair. She has not reported any dyspnea on exertion. Has not reported any palpitations. She did develop dark stools and her Xarelto was discontinued. Her bowel habits are normal at this time.  She continues to tolerat  treatment without any complications. She reports improvement in her appetite and her weight is up 233 pounds. She does not report any peripheral neuropathy, constipation or abdominal pain. She does not report any injection-related complications associated with Velcade. She does not report any insomnia or anxiety associated with dexamethasone.  She has not required any transfusions or growth factor support from anemia standpoint.  She does not report any headaches, blurry vision, syncope or seizures. She does not report any fevers, chills, sweats. She does not report any chest pain, palpitation orthopnea. Does not report any cough, hemoptysis or hematemesis. She does report occasional nausea but no  vomiting no hematochezia or melena. She does not report any frequency urgency or hesitancy. She does report chronic back pain which is unchanged. Remaining review of systems unremarkable.   Medications: I have reviewed the patient's current medications.  Current Outpatient Prescriptions  Medication Sig Dispense Refill  . acetaminophen (TYLENOL) 325 MG tablet Take 2 tablets (650 mg total) by mouth every 6 (six) hours as needed for mild pain (or Fever >/= 101). 60 tablet 0  . acyclovir (ZOVIRAX) 400 MG tablet Take 1 tablet (400 mg total) by mouth daily. 30 tablet 3  . albuterol-ipratropium (COMBIVENT) 18-103 MCG/ACT inhaler Inhale 1-2 puffs into the lungs every 4 (four) hours as needed for wheezing or shortness of breath.    [name] aspirin 325 MG tablet Take 325 mg by mouth daily.    . cholecalciferol (VITAMIN D) 1000 UNITS tablet Take 1,000 Units by mouth every morning.     [name] dexamethasone (DECADRON) 4 MG tablet Take 5 tablets every week before chemotherapy injection. 40 tablet 3  . ezetimibe (ZETIA) 10 MG tablet Take 1 tablet (10 mg total) by mouth at bedtime.    . feeding supplement, ENSURE ENLIVE, (ENSURE ENLIVE) LIQD Take 237 mLs by mouth 3 (three) times daily between meals. 237 mL 12  . isosorbide mononitrate (IMDUR) 30 MG 24 hr tablet Take 1 tablet (30 mg total) by mouth daily. 30 tablet 3  . metoprolol succinate (TOPROL-XL) 25 MG 24 hr tablet Take 1 tablet (25 mg total) by mouth daily. 30 tablet 3  . nitroGLYCERIN (NITROSTAT) 0.4 MG SL tablet Place 0.4 mg under the tongue every 5 (five) minutes as needed for chest pain.    . Omega-3 Fatty Acids (  FISH OIL) 1200 MG CAPS Take 1,200-2,400 mg by mouth 2 (two) times daily. Take 2 capsules (2400 mg) every morning and 1 capsule (1200 mg) every night    . pantoprazole (PROTONIX) 40 MG tablet Take 1 tablet (40 mg total) by mouth 2 (two) times daily.  0  . polyethylene glycol powder (GLYCOLAX/MIRALAX) powder Take 17 g by mouth every morning. Mix with 8 oz  liquid and drink  0  . prochlorperazine (COMPAZINE) 10 MG tablet Take 10 mg by mouth every 8 (eight) hours as needed for nausea.   0  . tiotropium (SPIRIVA) 18 MCG inhalation capsule Place 18 mcg into inhaler and inhale daily as needed (shortness of breath).      No current facility-administered medications for this visit.     Allergies:  Allergies  Allergen Reactions  . Lexapro [Escitalopram Oxalate] Other (See Comments)    Fatigue   . Soma [Carisoprodol] Other (See Comments)    Fatigue   . Wellbutrin [Bupropion] Other (See Comments)    "couldn't function and take it"  . Albuterol Sulfate Other (See Comments)    Albuterol HFA inhaler caused nervousness   . Codeine Hives  . Levaquin [Levofloxacin In D5w] Other (See Comments)    Unknown allergic reaction  . Plavix [Clopidogrel Bisulfate] Other (See Comments)    Unknown reaction  . Statins     Past Medical History, Surgical history, Social history, and Family History were reviewed and updated.   Physical Exam: Blood pressure 155/55, pulse 62, temperature 97.8 F (36.6 C), temperature source Oral, resp. rate 18, height _0  (1.575 m), weight 133 lb 14.4 oz (60.737 kg), SpO2 97 %. ECOG: 1 General appearance: alert and cooperative. Chronically ill-appearing woman without distress. Head: Normocephalic, without obvious abnormality no oral ulcers or lesions. Neck: no adenopathy Lymph nodes: Cervical, supraclavicular, and axillary nodes normal. Heart:regular rate and rhythm, S1, S2 normal, no murmur, click, rub or gallop Lung:chest clear, no wheezing, rales, normal symmetric air entry.  Abdomin: soft, non-tender, without masses or organomegaly  EXT:no erythema, induration, or nodules   Lab Results: Lab Results  Component Value Date   WBC 4.6 01/05/2015   HGB 9.8* 01/05/2015   HCT 29.4* 01/05/2015   MCV 101.3* 01/05/2015   PLT 146 01/05/2015     Chemistry      Component Value Date/Time   NA 138 12/29/2014 1549   NA 136  12/27/2014 0357   K 4.4 12/29/2014 1549   K 4.0 12/27/2014 0357   CL 104 12/27/2014 0357   CO2 23 12/29/2014 1549   CO2 26 12/27/2014 0357   BUN 22.4 12/29/2014 1549   BUN 24* 12/27/2014 0357   CREATININE 1.2* 12/29/2014 1549   CREATININE 0.91 12/27/2014 0357      Component Value Date/Time   CALCIUM 8.9 12/29/2014 1549   CALCIUM 8.9 12/27/2014 0357   ALKPHOS 78 12/29/2014 1549   ALKPHOS 56 12/27/2014 0357   AST 20 12/29/2014 1549   AST 23 12/27/2014 0357   ALT 17 12/29/2014 1549   ALT 18 12/27/2014 0357   BILITOT 0.39 12/29/2014 1549   BILITOT 0.5 12/27/2014 0357      Results for RUDELL, MARLOWE (MRN [id number]) as of 01/05/2015 15:28  Ref. Range 07/20/2014 14:29 09/22/2014 14:26 11/03/2014 15:36 12/29/2014 15:50  IgG (Immunoglobin G), Serum Latest Ref Range: [phone] mg/dL 3430 (H) 3650 (H) 2520 (H) 2290 (H)      Impression and Plan:  81 year old woman with the following issues:  1. IgG  kappa plasma cell disorder with 78% plasma cell infiltration in the bone marrow. She is quite symptomatic and certainly requires treatment. She meets the criteria of multiple myeloma given her anemia and overall symptomatology.  She is currently receiving Velcade and dexamethasone. Protein studies after 3 months of therapy showed decline of her IgG level from 3600-2219. Her M spike is down to close to 50% as well.  She has tolerated this regimen well and the plan is to continue with the same dose and schedule. The plan is to get her into CR or close to it at that time. We can consider adding Revlimid or Cytoxan if that have not reached by 6 months.  2. Anemia: Related to plasma cell disorder. Her hemoglobin is stable at this time.  3. Weight loss: Appetite is better with Megace therapy. Her weight is up by 3 pounds.  4. Thrombocytopenia: Related to Velcade no bleeding noted. Her platelet count has normalized at this time.  5. VZV prophylaxis: She continues to be on acyclovir.  6. Dark  color stools: She did have an element of GI bleeding with that have resolved at this time after stopping Xarelto. Her hemoglobin is relatively stable and no active bleeding noted.  7. Follow-up: Will be on a weekly basis for her injection. Clinical evaluation in 4 weeks.     Zola Button, MD 9/30/20163:25 PM

## 2015-01-05 NOTE — Telephone Encounter (Signed)
Gave and printed appt sched and avs fo rpt for OCT and NOV  °

## 2015-01-05 NOTE — Patient Instructions (Signed)
[facility] Discharge Instructions for Patients Receiving Chemotherapy  Today you received the following chemotherapy agents velcade.  To help prevent nausea and vomiting after your treatment, we encourage you to take your nausea medication .   If you develop nausea and vomiting that is not controlled by your nausea medication, call the clinic.   BELOW ARE SYMPTOMS THAT SHOULD BE REPORTED IMMEDIATELY:  *FEVER GREATER THAN 100.5 F  *CHILLS WITH OR WITHOUT FEVER  NAUSEA AND VOMITING THAT IS NOT CONTROLLED WITH YOUR NAUSEA MEDICATION  *UNUSUAL SHORTNESS OF BREATH  *UNUSUAL BRUISING OR BLEEDING  TENDERNESS IN MOUTH AND THROAT WITH OR WITHOUT PRESENCE OF ULCERS  *URINARY PROBLEMS  *BOWEL PROBLEMS  UNUSUAL RASH Items with * indicate a potential emergency and should be followed up as soon as possible.  Feel free to call the clinic you have any questions or concerns. The clinic phone number is [phone].  Please show the CHEMO ALERT CARD at check-in to the Emergency Department and triage nurse.   

## 2015-01-12 ENCOUNTER — Other Ambulatory Visit (HOSPITAL_BASED_OUTPATIENT_CLINIC_OR_DEPARTMENT_OTHER): Payer: Medicare Other

## 2015-01-12 ENCOUNTER — Ambulatory Visit (HOSPITAL_BASED_OUTPATIENT_CLINIC_OR_DEPARTMENT_OTHER): Payer: Medicare Other

## 2015-01-12 VITALS — BP 152/66 | HR 48 | Temp 97.5°F

## 2015-01-12 DIAGNOSIS — Z5112 Encounter for antineoplastic immunotherapy: Secondary | ICD-10-CM | POA: Diagnosis not present

## 2015-01-12 DIAGNOSIS — C9000 Multiple myeloma not having achieved remission: Secondary | ICD-10-CM | POA: Diagnosis not present

## 2015-01-12 LAB — COMPREHENSIVE METABOLIC PANEL (CC13)
ALT: 12 U/L (ref 0–55)
ANION GAP: 7 meq/L (ref 3–11)
AST: 16 U/L (ref 5–34)
Albumin: 3.5 g/dL (ref 3.5–5.0)
Alkaline Phosphatase: 71 U/L (ref 40–150)
BILIRUBIN TOTAL: 0.35 mg/dL (ref 0.20–1.20)
BUN: 17.5 mg/dL (ref 7.0–26.0)
CHLORIDE: 107 meq/L (ref 98–109)
CO2: 26 meq/L (ref 22–29)
CREATININE: 1.3 mg/dL — AB (ref 0.6–1.1)
Calcium: 9.4 mg/dL (ref 8.4–10.4)
EGFR: 38 mL/min/{1.73_m2} — ABNORMAL LOW (ref >90)
GLUCOSE: 119 mg/dL (ref 70–140)
Potassium: 4.5 mEq/L (ref 3.5–5.1)
SODIUM: 140 meq/L (ref 136–145)
TOTAL PROTEIN: 8.3 g/dL (ref 6.4–8.3)

## 2015-01-12 LAB — CBC WITH DIFFERENTIAL/PLATELET
BASO%: 0.2 % (ref 0.0–2.0)
BASOS ABS: 0 10*3/uL (ref 0.0–0.1)
EOS ABS: 0 10*3/uL (ref 0.0–0.5)
EOS%: 0.5 % (ref 0.0–7.0)
HCT: 30 % — ABNORMAL LOW (ref 34.8–46.6)
HGB: 9.9 g/dL — ABNORMAL LOW (ref 11.6–15.9)
LYMPH%: 47.3 % (ref 14.0–49.7)
MCH: 33.3 pg (ref 25.1–34.0)
MCHC: 33 g/dL (ref 31.5–36.0)
MCV: 101.1 fL — AB (ref 79.5–101.0)
MONO#: 0.5 10*3/uL (ref 0.1–0.9)
MONO%: 9.7 % (ref 0.0–14.0)
NEUT#: 2.3 10*3/uL (ref 1.5–6.5)
NEUT%: 42.3 % (ref 38.4–76.8)
Platelets: 180 10*3/uL (ref 145–400)
RBC: 2.96 10*6/uL — ABNORMAL LOW (ref 3.70–5.45)
RDW: 16.4 % — ABNORMAL HIGH (ref 11.2–14.5)
WBC: 5.4 10*3/uL (ref 3.9–10.3)
lymph#: 2.5 10*3/uL (ref 0.9–3.3)

## 2015-01-12 MED ORDER — BORTEZOMIB CHEMO SQ INJECTION 3.5 MG (2.5MG/ML)
1.3000 mg/m2 | Freq: Once | INTRAMUSCULAR | Status: AC
  Administered 2015-01-12: 2 mg via SUBCUTANEOUS
  Filled 2015-01-12: qty 2

## 2015-01-12 MED ORDER — ONDANSETRON HCL 8 MG PO TABS
ORAL_TABLET | ORAL | Status: AC
  Filled 2015-01-12: qty 1

## 2015-01-12 MED ORDER — ONDANSETRON HCL 8 MG PO TABS
8.0000 mg | ORAL_TABLET | Freq: Once | ORAL | Status: AC
  Administered 2015-01-12: 8 mg via ORAL

## 2015-01-12 NOTE — Patient Instructions (Signed)
[facility] Discharge Instructions for Patients Receiving Chemotherapy  Today you received the following chemotherapy agents: Velcade.  To help prevent nausea and vomiting after your treatment, we encourage you to take your nausea medication: Compazine 10 mg every 6 hours as needed.   If you develop nausea and vomiting that is not controlled by your nausea medication, call the clinic.   BELOW ARE SYMPTOMS THAT SHOULD BE REPORTED IMMEDIATELY:  *FEVER GREATER THAN 100.5 F  *CHILLS WITH OR WITHOUT FEVER  NAUSEA AND VOMITING THAT IS NOT CONTROLLED WITH YOUR NAUSEA MEDICATION  *UNUSUAL SHORTNESS OF BREATH  *UNUSUAL BRUISING OR BLEEDING  TENDERNESS IN MOUTH AND THROAT WITH OR WITHOUT PRESENCE OF ULCERS  *URINARY PROBLEMS  *BOWEL PROBLEMS  UNUSUAL RASH Items with * indicate a potential emergency and should be followed up as soon as possible.  Feel free to call the clinic you have any questions or concerns. The clinic phone number is [phone].  Please show the CHEMO ALERT CARD at check-in to the Emergency Department and triage nurse.   

## 2015-01-19 ENCOUNTER — Other Ambulatory Visit (HOSPITAL_BASED_OUTPATIENT_CLINIC_OR_DEPARTMENT_OTHER): Payer: Medicare Other

## 2015-01-19 ENCOUNTER — Ambulatory Visit (HOSPITAL_BASED_OUTPATIENT_CLINIC_OR_DEPARTMENT_OTHER): Payer: Medicare Other

## 2015-01-19 VITALS — BP 156/48 | HR 44 | Temp 97.0°F | Resp 18

## 2015-01-19 DIAGNOSIS — C9000 Multiple myeloma not having achieved remission: Secondary | ICD-10-CM

## 2015-01-19 DIAGNOSIS — Z5112 Encounter for antineoplastic immunotherapy: Secondary | ICD-10-CM

## 2015-01-19 LAB — CBC WITH DIFFERENTIAL/PLATELET
BASO%: 0.3 % (ref 0.0–2.0)
BASOS ABS: 0 10*3/uL (ref 0.0–0.1)
EOS%: 0.3 % (ref 0.0–7.0)
Eosinophils Absolute: 0 10*3/uL (ref 0.0–0.5)
HEMATOCRIT: 31.4 % — AB (ref 34.8–46.6)
HEMOGLOBIN: 10.3 g/dL — AB (ref 11.6–15.9)
LYMPH#: 1.3 10*3/uL (ref 0.9–3.3)
LYMPH%: 23.7 % (ref 14.0–49.7)
MCH: 32.9 pg (ref 25.1–34.0)
MCHC: 32.9 g/dL (ref 31.5–36.0)
MCV: 100 fL (ref 79.5–101.0)
MONO#: 0.1 10*3/uL (ref 0.1–0.9)
MONO%: 2.5 % (ref 0.0–14.0)
NEUT%: 73.2 % (ref 38.4–76.8)
NEUTROS ABS: 3.9 10*3/uL (ref 1.5–6.5)
Platelets: 183 10*3/uL (ref 145–400)
RBC: 3.14 10*6/uL — ABNORMAL LOW (ref 3.70–5.45)
RDW: 16.5 % — AB (ref 11.2–14.5)
WBC: 5.3 10*3/uL (ref 3.9–10.3)

## 2015-01-19 LAB — COMPREHENSIVE METABOLIC PANEL (CC13)
ALBUMIN: 3.8 g/dL (ref 3.5–5.0)
ALK PHOS: 73 U/L (ref 40–150)
ALT: 11 U/L (ref 0–55)
AST: 18 U/L (ref 5–34)
Anion Gap: 6 mEq/L (ref 3–11)
BUN: 25.9 mg/dL (ref 7.0–26.0)
CALCIUM: 9.3 mg/dL (ref 8.4–10.4)
CO2: 26 mEq/L (ref 22–29)
Chloride: 105 mEq/L (ref 98–109)
Creatinine: 1.4 mg/dL — ABNORMAL HIGH (ref 0.6–1.1)
EGFR: 35 mL/min/{1.73_m2} — AB (ref >90)
GLUCOSE: 148 mg/dL — AB (ref 70–140)
POTASSIUM: 5 meq/L (ref 3.5–5.1)
SODIUM: 137 meq/L (ref 136–145)
Total Bilirubin: 0.41 mg/dL (ref 0.20–1.20)
Total Protein: 8.7 g/dL — ABNORMAL HIGH (ref 6.4–8.3)

## 2015-01-19 MED ORDER — ONDANSETRON HCL 8 MG PO TABS
ORAL_TABLET | ORAL | Status: AC
  Filled 2015-01-19: qty 1

## 2015-01-19 MED ORDER — BORTEZOMIB CHEMO SQ INJECTION 3.5 MG (2.5MG/ML)
1.3000 mg/m2 | Freq: Once | INTRAMUSCULAR | Status: AC
  Administered 2015-01-19: 2 mg via SUBCUTANEOUS
  Filled 2015-01-19: qty 2

## 2015-01-19 MED ORDER — ONDANSETRON HCL 8 MG PO TABS
8.0000 mg | ORAL_TABLET | Freq: Once | ORAL | Status: AC
  Administered 2015-01-19: 8 mg via ORAL

## 2015-01-25 ENCOUNTER — Other Ambulatory Visit (HOSPITAL_BASED_OUTPATIENT_CLINIC_OR_DEPARTMENT_OTHER): Payer: Medicare Other

## 2015-01-25 ENCOUNTER — Other Ambulatory Visit: Payer: Self-pay | Admitting: Internal Medicine

## 2015-01-25 ENCOUNTER — Ambulatory Visit (HOSPITAL_BASED_OUTPATIENT_CLINIC_OR_DEPARTMENT_OTHER): Payer: Medicare Other

## 2015-01-25 ENCOUNTER — Ambulatory Visit
Admission: RE | Admit: 2015-01-25 | Discharge: 2015-01-25 | Disposition: A | Discharge status: Home / Self Care | Payer: Medicare Other | Source: Ambulatory Visit | Attending: Internal Medicine | Admitting: Internal Medicine

## 2015-01-25 VITALS — BP 108/60 | HR 49 | Temp 98.0°F | Resp 18

## 2015-01-25 DIAGNOSIS — C9000 Multiple myeloma not having achieved remission: Secondary | ICD-10-CM | POA: Diagnosis not present

## 2015-01-25 DIAGNOSIS — R110 Nausea: Secondary | ICD-10-CM

## 2015-01-25 DIAGNOSIS — M25552 Pain in left hip: Secondary | ICD-10-CM

## 2015-01-25 DIAGNOSIS — M79605 Pain in left leg: Secondary | ICD-10-CM

## 2015-01-25 LAB — CBC WITH DIFFERENTIAL/PLATELET
BASO%: 0.9 % (ref 0.0–2.0)
Basophils Absolute: 0.1 10*3/uL (ref 0.0–0.1)
EOS ABS: 0 10*3/uL (ref 0.0–0.5)
EOS%: 0.2 % (ref 0.0–7.0)
HEMATOCRIT: 33.7 % — AB (ref 34.8–46.6)
HGB: 11.2 g/dL — ABNORMAL LOW (ref 11.6–15.9)
LYMPH#: 2.6 10*3/uL (ref 0.9–3.3)
LYMPH%: 40.2 % (ref 14.0–49.7)
MCH: 33.1 pg (ref 25.1–34.0)
MCHC: 33.3 g/dL (ref 31.5–36.0)
MCV: 99.3 fL (ref 79.5–101.0)
MONO#: 0.5 10*3/uL (ref 0.1–0.9)
MONO%: 7.2 % (ref 0.0–14.0)
NEUT%: 51.5 % (ref 38.4–76.8)
NEUTROS ABS: 3.4 10*3/uL (ref 1.5–6.5)
PLATELETS: 197 10*3/uL (ref 145–400)
RBC: 3.39 10*6/uL — ABNORMAL LOW (ref 3.70–5.45)
RDW: 17.3 % — ABNORMAL HIGH (ref 11.2–14.5)
WBC: 6.6 10*3/uL (ref 3.9–10.3)

## 2015-01-25 LAB — COMPREHENSIVE METABOLIC PANEL (CC13)
ALK PHOS: 63 U/L (ref 40–150)
ALT: <9 U/L (ref 0–55)
AST: 17 U/L (ref 5–34)
Albumin: 3.6 g/dL (ref 3.5–5.0)
Anion Gap: 10 mEq/L (ref 3–11)
BUN: 26.1 mg/dL — ABNORMAL HIGH (ref 7.0–26.0)
CO2: 25 mEq/L (ref 22–29)
Calcium: 9.6 mg/dL (ref 8.4–10.4)
Chloride: 105 mEq/L (ref 98–109)
Creatinine: 2 mg/dL — ABNORMAL HIGH (ref 0.6–1.1)
EGFR: 23 mL/min/{1.73_m2} — AB (ref >90)
GLUCOSE: 146 mg/dL — AB (ref 70–140)
POTASSIUM: 3.5 meq/L (ref 3.5–5.1)
SODIUM: 140 meq/L (ref 136–145)
Total Bilirubin: 0.51 mg/dL (ref 0.20–1.20)
Total Protein: 8.6 g/dL — ABNORMAL HIGH (ref 6.4–8.3)

## 2015-01-25 MED ORDER — ONDANSETRON HCL 8 MG PO TABS
8.0000 mg | ORAL_TABLET | Freq: Once | ORAL | Status: AC
  Administered 2015-01-25: 8 mg via ORAL

## 2015-01-25 MED ORDER — SODIUM CHLORIDE 0.9 % IV SOLN
1000.0000 mL | Freq: Once | INTRAVENOUS | Status: AC
  Administered 2015-01-25: 1000 mL via INTRAVENOUS

## 2015-01-25 MED ORDER — ONDANSETRON HCL 8 MG PO TABS
ORAL_TABLET | ORAL | Status: AC
  Filled 2015-01-25: qty 1

## 2015-01-25 MED ORDER — PROCHLORPERAZINE EDISYLATE 5 MG/ML IJ SOLN
INTRAMUSCULAR | Status: AC
  Filled 2015-01-25: qty 2

## 2015-01-25 MED ORDER — PROCHLORPERAZINE EDISYLATE 5 MG/ML IJ SOLN
10.0000 mg | Freq: Once | INTRAMUSCULAR | Status: AC
  Administered 2015-01-25: 10 mg via INTRAVENOUS

## 2015-01-25 NOTE — Patient Instructions (Signed)

## 2015-01-25 NOTE — Progress Notes (Signed)
1010-Pt complaining of nausea with no vomiting, diarrhea x 1 this AM, pt states that she did not eat at all on 01/24/15 and has dizziness this AM. Dr. Alen Blew notified and order received to hold Velcade today and to give pt 1 liter of NS over 2 hours.  1025-Pt with persistent nausea, Dr. Alen Blew notified and order received to give pt Compazine 10 mg IV now.

## 2015-01-25 NOTE — Progress Notes (Signed)
Pt tolerated IVF. Pt reports no signs of nausea or vomiting. AVS and calendar schedule printed out for pt. Encouraged pt and friend to drink fluids today.Pt aware and understands.

## 2015-01-26 ENCOUNTER — Other Ambulatory Visit: Payer: Medicare Other

## 2015-01-26 ENCOUNTER — Ambulatory Visit: Payer: Medicare Other

## 2015-02-02 ENCOUNTER — Other Ambulatory Visit (HOSPITAL_BASED_OUTPATIENT_CLINIC_OR_DEPARTMENT_OTHER): Payer: Medicare Other

## 2015-02-02 ENCOUNTER — Ambulatory Visit (HOSPITAL_BASED_OUTPATIENT_CLINIC_OR_DEPARTMENT_OTHER): Payer: Medicare Other

## 2015-02-02 VITALS — BP 119/59 | HR 59 | Temp 96.3°F | Resp 20

## 2015-02-02 DIAGNOSIS — Z5112 Encounter for antineoplastic immunotherapy: Secondary | ICD-10-CM | POA: Diagnosis not present

## 2015-02-02 DIAGNOSIS — C9000 Multiple myeloma not having achieved remission: Secondary | ICD-10-CM

## 2015-02-02 LAB — COMPREHENSIVE METABOLIC PANEL (CC13)
ALT: 10 U/L (ref 0–55)
ANION GAP: 8 meq/L (ref 3–11)
AST: 21 U/L (ref 5–34)
Albumin: 3.6 g/dL (ref 3.5–5.0)
Alkaline Phosphatase: 61 U/L (ref 40–150)
BILIRUBIN TOTAL: 0.3 mg/dL (ref 0.20–1.20)
BUN: 16.3 mg/dL (ref 7.0–26.0)
CALCIUM: 9.4 mg/dL (ref 8.4–10.4)
CHLORIDE: 107 meq/L (ref 98–109)
CO2: 24 meq/L (ref 22–29)
CREATININE: 1.3 mg/dL — AB (ref 0.6–1.1)
EGFR: 40 mL/min/{1.73_m2} — AB (ref >90)
Glucose: 114 mg/dl (ref 70–140)
Potassium: 3.7 mEq/L (ref 3.5–5.1)
Sodium: 139 mEq/L (ref 136–145)
Total Protein: 8.6 g/dL — ABNORMAL HIGH (ref 6.4–8.3)

## 2015-02-02 LAB — CBC WITH DIFFERENTIAL/PLATELET
BASO%: 0.2 % (ref 0.0–2.0)
BASOS ABS: 0 10*3/uL (ref 0.0–0.1)
EOS%: 0.4 % (ref 0.0–7.0)
Eosinophils Absolute: 0 10*3/uL (ref 0.0–0.5)
HEMATOCRIT: 33.7 % — AB (ref 34.8–46.6)
HEMOGLOBIN: 10.9 g/dL — AB (ref 11.6–15.9)
LYMPH#: 2 10*3/uL (ref 0.9–3.3)
LYMPH%: 42.2 % (ref 14.0–49.7)
MCH: 31.9 pg (ref 25.1–34.0)
MCHC: 32.3 g/dL (ref 31.5–36.0)
MCV: 98.5 fL (ref 79.5–101.0)
MONO#: 0.3 10*3/uL (ref 0.1–0.9)
MONO%: 6.4 % (ref 0.0–14.0)
NEUT%: 50.8 % (ref 38.4–76.8)
NEUTROS ABS: 2.4 10*3/uL (ref 1.5–6.5)
Platelets: 210 10*3/uL (ref 145–400)
RBC: 3.42 10*6/uL — ABNORMAL LOW (ref 3.70–5.45)
RDW: 15.4 % — AB (ref 11.2–14.5)
WBC: 4.7 10*3/uL (ref 3.9–10.3)

## 2015-02-02 MED ORDER — ONDANSETRON HCL 8 MG PO TABS
ORAL_TABLET | ORAL | Status: AC
  Filled 2015-02-02: qty 1

## 2015-02-02 MED ORDER — ONDANSETRON HCL 8 MG PO TABS
8.0000 mg | ORAL_TABLET | Freq: Once | ORAL | Status: AC
  Administered 2015-02-02: 8 mg via ORAL

## 2015-02-02 MED ORDER — BORTEZOMIB CHEMO SQ INJECTION 3.5 MG (2.5MG/ML)
1.3000 mg/m2 | Freq: Once | INTRAMUSCULAR | Status: AC
  Administered 2015-02-02: 2 mg via SUBCUTANEOUS
  Filled 2015-02-02: qty 2

## 2015-02-02 NOTE — Patient Instructions (Signed)
[facility] Discharge Instructions for Patients Receiving Chemotherapy  Today you received the following chemotherapy agents: Velcade.  To help prevent nausea and vomiting after your treatment, we encourage you to take your nausea medication: Compazine 10 mg every 6 hours as needed.   If you develop nausea and vomiting that is not controlled by your nausea medication, call the clinic.   BELOW ARE SYMPTOMS THAT SHOULD BE REPORTED IMMEDIATELY:  *FEVER GREATER THAN 100.5 F  *CHILLS WITH OR WITHOUT FEVER  NAUSEA AND VOMITING THAT IS NOT CONTROLLED WITH YOUR NAUSEA MEDICATION  *UNUSUAL SHORTNESS OF BREATH  *UNUSUAL BRUISING OR BLEEDING  TENDERNESS IN MOUTH AND THROAT WITH OR WITHOUT PRESENCE OF ULCERS  *URINARY PROBLEMS  *BOWEL PROBLEMS  UNUSUAL RASH Items with * indicate a potential emergency and should be followed up as soon as possible.  Feel free to call the clinic you have any questions or concerns. The clinic phone number is [phone].  Please show the CHEMO ALERT CARD at check-in to the Emergency Department and triage nurse.   

## 2015-02-06 LAB — SPEP & IFE WITH QIG
Abnormal Protein Band1: 1.9 g/dL
Albumin ELP: 3.9 g/dL (ref 3.8–4.8)
Alpha-1-Globulin: 0.5 g/dL — ABNORMAL HIGH (ref 0.2–0.3)
Alpha-2-Globulin: 0.8 g/dL (ref 0.5–0.9)
Beta 2: 0.2 g/dL (ref 0.2–0.5)
Beta Globulin: 0.4 g/dL (ref 0.4–0.6)
Gamma Globulin: 2.2 g/dL — ABNORMAL HIGH (ref 0.8–1.7)
IGA: 29 mg/dL — AB (ref 69–380)
IGG (IMMUNOGLOBIN G), SERUM: 2980 mg/dL — AB (ref 690–1700)
IGM, SERUM: 6 mg/dL — AB (ref 52–322)
TOTAL PROTEIN, SERUM ELECTROPHOR: 8 g/dL (ref 6.1–8.1)

## 2015-02-06 LAB — KAPPA/LAMBDA LIGHT CHAINS
KAPPA LAMBDA RATIO: 42 — AB (ref 0.26–1.65)
Kappa free light chain: 10.5 mg/dL — ABNORMAL HIGH (ref 0.33–1.94)
LAMBDA FREE LGHT CHN: 0.25 mg/dL — AB (ref 0.57–2.63)

## 2015-02-09 ENCOUNTER — Telehealth: Payer: Self-pay | Admitting: Oncology

## 2015-02-09 ENCOUNTER — Ambulatory Visit (HOSPITAL_BASED_OUTPATIENT_CLINIC_OR_DEPARTMENT_OTHER): Payer: Medicare Other | Admitting: Oncology

## 2015-02-09 ENCOUNTER — Ambulatory Visit (HOSPITAL_BASED_OUTPATIENT_CLINIC_OR_DEPARTMENT_OTHER): Payer: Medicare Other

## 2015-02-09 ENCOUNTER — Other Ambulatory Visit (HOSPITAL_BASED_OUTPATIENT_CLINIC_OR_DEPARTMENT_OTHER): Payer: Medicare Other

## 2015-02-09 VITALS — BP 131/66 | HR 67 | Temp 97.9°F | Resp 19 | Ht 62.0 in | Wt 131.0 lb

## 2015-02-09 DIAGNOSIS — G8929 Other chronic pain: Secondary | ICD-10-CM

## 2015-02-09 DIAGNOSIS — D6959 Other secondary thrombocytopenia: Secondary | ICD-10-CM

## 2015-02-09 DIAGNOSIS — Z5112 Encounter for antineoplastic immunotherapy: Secondary | ICD-10-CM

## 2015-02-09 DIAGNOSIS — C9000 Multiple myeloma not having achieved remission: Secondary | ICD-10-CM

## 2015-02-09 DIAGNOSIS — M545 Low back pain: Secondary | ICD-10-CM

## 2015-02-09 DIAGNOSIS — D630 Anemia in neoplastic disease: Secondary | ICD-10-CM

## 2015-02-09 DIAGNOSIS — C9002 Multiple myeloma in relapse: Secondary | ICD-10-CM | POA: Diagnosis not present

## 2015-02-09 LAB — COMPREHENSIVE METABOLIC PANEL (CC13)
ALBUMIN: 3.7 g/dL (ref 3.5–5.0)
ALK PHOS: 65 U/L (ref 40–150)
ALT: <9 U/L (ref 0–55)
AST: 19 U/L (ref 5–34)
Anion Gap: 8 mEq/L (ref 3–11)
BUN: 20.5 mg/dL (ref 7.0–26.0)
CALCIUM: 9.3 mg/dL (ref 8.4–10.4)
CHLORIDE: 107 meq/L (ref 98–109)
CO2: 24 mEq/L (ref 22–29)
Creatinine: 1.4 mg/dL — ABNORMAL HIGH (ref 0.6–1.1)
EGFR: 34 mL/min/{1.73_m2} — AB (ref >90)
Glucose: 160 mg/dl — ABNORMAL HIGH (ref 70–140)
POTASSIUM: 4.2 meq/L (ref 3.5–5.1)
SODIUM: 139 meq/L (ref 136–145)
Total Bilirubin: <0.3 mg/dL (ref 0.20–1.20)
Total Protein: 8.4 g/dL — ABNORMAL HIGH (ref 6.4–8.3)

## 2015-02-09 LAB — CBC WITH DIFFERENTIAL/PLATELET
BASO%: 0.5 % (ref 0.0–2.0)
Basophils Absolute: 0 10*3/uL (ref 0.0–0.1)
EOS ABS: 0 10*3/uL (ref 0.0–0.5)
EOS%: 0.1 % (ref 0.0–7.0)
HEMATOCRIT: 32.3 % — AB (ref 34.8–46.6)
HEMOGLOBIN: 10.6 g/dL — AB (ref 11.6–15.9)
LYMPH#: 1.3 10*3/uL (ref 0.9–3.3)
LYMPH%: 35.5 % (ref 14.0–49.7)
MCH: 31.7 pg (ref 25.1–34.0)
MCHC: 32.8 g/dL (ref 31.5–36.0)
MCV: 96.8 fL (ref 79.5–101.0)
MONO#: 0.1 10*3/uL (ref 0.1–0.9)
MONO%: 3 % (ref 0.0–14.0)
NEUT%: 60.9 % (ref 38.4–76.8)
NEUTROS ABS: 2.2 10*3/uL (ref 1.5–6.5)
PLATELETS: 166 10*3/uL (ref 145–400)
RBC: 3.34 10*6/uL — ABNORMAL LOW (ref 3.70–5.45)
RDW: 16.7 % — ABNORMAL HIGH (ref 11.2–14.5)
WBC: 3.6 10*3/uL — AB (ref 3.9–10.3)

## 2015-02-09 MED ORDER — ONDANSETRON HCL 8 MG PO TABS
ORAL_TABLET | ORAL | Status: AC
  Filled 2015-02-09: qty 1

## 2015-02-09 MED ORDER — BORTEZOMIB CHEMO SQ INJECTION 3.5 MG (2.5MG/ML)
1.3000 mg/m2 | Freq: Once | INTRAMUSCULAR | Status: AC
  Administered 2015-02-09: 2 mg via SUBCUTANEOUS
  Filled 2015-02-09: qty 2

## 2015-02-09 MED ORDER — ONDANSETRON HCL 8 MG PO TABS
8.0000 mg | ORAL_TABLET | Freq: Once | ORAL | Status: AC
  Administered 2015-02-09: 8 mg via ORAL

## 2015-02-09 NOTE — Telephone Encounter (Signed)
Appointments complete per 11/4 pof. Patient to get avs report/appointments in inf area.

## 2015-02-09 NOTE — Progress Notes (Signed)
Hematology and Oncology Follow Up Visit  [name] [id number] 17-Oct-1931 81 y.o. 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 She was found to have IgG level of 3430 and an M spike of 2.9 g/dL on 07/20/2014. Bone marrow biopsy on 08/30/2014 showed 78% plasma cell dyscrasia.   Prior Therapy: Status post packed red cell transfusions during hospitalization in February 2016. This was repeated in May 2016.  She is status post a bone marrow biopsy on 08/30/2014.  Current therapy:  Systemic therapy with Velcade and dexamethasone weekly regimen starting on 09/22/2014.   Interim History: [name] presents today for a follow-up visit accompanied by her daughter. Since the last visit, she continues to improve slowly. She has not reported any recent hospitalization or illnesses since September 2016.She has not reported any dyspnea on exertion. Has not reported any palpitations.  She continues to tolerat  treatment without any complications. She reports improvement in her appetite and her weight is stable at this point.She does not report any peripheral neuropathy, constipation or abdominal pain. She does not report any injection-related complications associated with Velcade. She does not report any insomnia or anxiety associated with dexamethasone.  She does not report any headaches, blurry vision, syncope or seizures. She does not report any fevers, chills, sweats. She does not report any chest pain, palpitation orthopnea. Does not report any cough, hemoptysis or hematemesis. She does report occasional nausea but no vomiting no hematochezia or melena. She does not report any frequency urgency or hesitancy. She does report chronic back pain which is unchanged. Remaining review of systems unremarkable.   Medications: I have reviewed the patient's current medications.  Current Outpatient  Prescriptions  Medication Sig Dispense Refill  . acetaminophen (TYLENOL) 325 MG tablet Take 2 tablets (650 mg total) by mouth every 6 (six) hours as needed for mild pain (or Fever >/= 101). 60 tablet 0  . acyclovir (ZOVIRAX) 400 MG tablet Take 1 tablet (400 mg total) by mouth daily. 30 tablet 3  . albuterol-ipratropium (COMBIVENT) 18-103 MCG/ACT inhaler Inhale 1-2 puffs into the lungs every 4 (four) hours as needed for wheezing or shortness of breath.    [name] aspirin 325 MG tablet Take 325 mg by mouth daily.    . cholecalciferol (VITAMIN D) 1000 UNITS tablet Take 1,000 Units by mouth every morning.     [name] dexamethasone (DECADRON) 4 MG tablet Take 5 tablets every week before chemotherapy injection. 40 tablet 3  . ezetimibe (ZETIA) 10 MG tablet Take 1 tablet (10 mg total) by mouth at bedtime.    . feeding supplement, ENSURE ENLIVE, (ENSURE ENLIVE) LIQD Take 237 mLs by mouth 3 (three) times daily between meals. 237 mL 12  . isosorbide mononitrate (IMDUR) 30 MG 24 hr tablet Take 1 tablet (30 mg total) by mouth daily. 30 tablet 3  . metoprolol succinate (TOPROL-XL) 25 MG 24 hr tablet Take 1 tablet (25 mg total) by mouth daily. 30 tablet 3  . nitroGLYCERIN (NITROSTAT) 0.4 MG SL tablet Place 0.4 mg under the tongue every 5 (five) minutes as needed for chest pain.    . Omega-3 Fatty Acids (FISH OIL) 1200 MG CAPS Take 1,200-2,400 mg by mouth 2 (two) times daily. Take 2 capsules (2400 mg) every morning and 1 capsule (1200 mg) every night    . pantoprazole (PROTONIX) 40 MG tablet Take 1 tablet (40 mg total) by mouth 2 (two) times  daily.  0  . polyethylene glycol powder (GLYCOLAX/MIRALAX) powder Take 17 g by mouth every morning. Mix with 8 oz liquid and drink  0  . prochlorperazine (COMPAZINE) 10 MG tablet Take 10 mg by mouth every 8 (eight) hours as needed for nausea.   0  . tiotropium (SPIRIVA) 18 MCG inhalation capsule Place 18 mcg into inhaler and inhale daily as needed (shortness of breath).     . traMADol  (ULTRAM) 50 MG tablet Take 50 mg by mouth as directed.     No current facility-administered medications for this visit.   Facility-Administered Medications Ordered in Other Visits  Medication Dose Route Frequency Provider Last Rate Last Dose  . bortezomib SQ (VELCADE) chemo injection 2 mg  1.3 mg/m2 (Treatment Plan Actual) Subcutaneous Once Wyatt Portela, MD         Allergies:  Allergies  Allergen Reactions  . Lexapro [Escitalopram Oxalate] Other (See Comments)    Fatigue   . Soma [Carisoprodol] Other (See Comments)    Fatigue   . Wellbutrin [Bupropion] Other (See Comments)    "couldn't function and take it"  . Albuterol Sulfate Other (See Comments)    Albuterol HFA inhaler caused nervousness   . Codeine Hives  . Levaquin [Levofloxacin In D5w] Other (See Comments)    Unknown allergic reaction  . Plavix [Clopidogrel Bisulfate] Other (See Comments)    Unknown reaction  . Statins     Past Medical History, Surgical history, Social history, and Family History were reviewed and updated.   Physical Exam: Blood pressure 131/66, pulse 67, temperature 97.9 F (36.6 C), temperature source Oral, resp. rate 19, height $RemoveBe'5\' 2"'DtaWAsTyi$  (1.575 m), weight 131 lb (59.421 kg), SpO2 95 %. ECOG: 1 General appearance: alert and cooperative. Chronically ill-appearing woman without distress. Head: Normocephalic, without obvious abnormality no oral ulcers or lesions. Neck: no adenopathy Lymph nodes: Cervical, supraclavicular, and axillary nodes normal. Heart:regular rate and rhythm, S1, S2 normal, no murmur, click, rub or gallop Lung:chest clear, no wheezing, rales, normal symmetric air entry.  Abdomin: soft, non-tender, without masses or organomegaly  EXT:no erythema, induration, or nodules   Lab Results: Lab Results  Component Value Date   WBC 3.6* 02/09/2015   HGB 10.6* 02/09/2015   HCT 32.3* 02/09/2015   MCV 96.8 02/09/2015   PLT 166 02/09/2015     Chemistry      Component Value Date/Time    NA 139 02/09/2015 1455   NA 136 12/27/2014 0357   K 4.2 02/09/2015 1455   K 4.0 12/27/2014 0357   CL 104 12/27/2014 0357   CO2 24 02/09/2015 1455   CO2 26 12/27/2014 0357   BUN 20.5 02/09/2015 1455   BUN 24* 12/27/2014 0357   CREATININE 1.4* 02/09/2015 1455   CREATININE 0.91 12/27/2014 0357      Component Value Date/Time   CALCIUM 9.3 02/09/2015 1455   CALCIUM 8.9 12/27/2014 0357   ALKPHOS 65 02/09/2015 1455   ALKPHOS 56 12/27/2014 0357   AST 19 02/09/2015 1455   AST 23 12/27/2014 0357   ALT <9 02/09/2015 1455   ALT 18 12/27/2014 0357   BILITOT <0.30 02/09/2015 1455   BILITOT 0.5 12/27/2014 0357        Results for [name], [name] (MRN [id number]) as of 02/09/2015 15:05  Ref. Range 12/29/2014 15:50 02/02/2015 15:46  IgG (Immunoglobin G), Serum Latest Ref Range: [phone] mg/dL 2290 (H) 2980 (H)  IgA Latest Ref Range: 69-380 mg/dL 18 (L) 29 (L)  IgM, Serum Latest  Ref Range: 52-322 mg/dL <5 (L) 6 (L)  Total Protein, Serum Electrophoresis Latest Ref Range: 6.1-8.1 g/dL 7.7 8.0  Kappa free light chain Latest Ref Range: 0.33-1.94 mg/dL 5.51 (H) 10.50 (H)  Lambda Free Lght Chn Latest Ref Range: 0.57-2.63 mg/dL 0.18 (L) 0.25 (L)  Kappa:Lambda Ratio Latest Ref Range: 0.26-1.65  30.61 (H) 42.00 (H)     Impression and Plan:  81 year old woman with the following issues:  1. IgG kappa plasma cell disorder with 78% plasma cell infiltration in the bone marrow. She is quite symptomatic and certainly requires treatment. She meets the criteria of multiple myeloma given her anemia and overall symptomatology.  She is currently receiving Velcade and dexamethasone. Protein studies after 4 months of therapy, she had a reasonable response her protein studies with IgG level down from 3600 to 2200.  Her most recent protein studies in 02/02/2015 shows slight increase in her IgG level up to 2900. This could be a sign of resistance to Velcade and that might need a different salvage regimen. I  discussed with her the potential of switching to Revlimid and dexamethasone  If protein studies continued to rise. I have elected to continue with Velcade for the next month and check protein studies at that time.  2. Anemia: Related to plasma cell disorder. Her hemoglobin is stable at this time.  3. Weight loss: Appetite is better we will await been stable.  4. Thrombocytopenia: Related to Velcade no bleeding noted. Her platelet count has normalized at this time.  5. VZV prophylaxis: She continues to be on acyclovir.  6. Dark color stools:  This have resolved at this time with a stable hemoglobin.  7. Follow-up: Will be on a weekly basis for her injection. Clinical evaluation in 4 weeks.     Zola Button, MD 11/4/20163:39 PM

## 2015-02-09 NOTE — Patient Instructions (Signed)
[facility] Discharge Instructions for Patients Receiving Chemotherapy  Today you received the following chemotherapy agents:  Velcade  To help prevent nausea and vomiting after your treatment, we encourage you to take your nausea medication as ordered per MD.   If you develop nausea and vomiting that is not controlled by your nausea medication, call the clinic.   BELOW ARE SYMPTOMS THAT SHOULD BE REPORTED IMMEDIATELY:  *FEVER GREATER THAN 100.5 F  *CHILLS WITH OR WITHOUT FEVER  NAUSEA AND VOMITING THAT IS NOT CONTROLLED WITH YOUR NAUSEA MEDICATION  *UNUSUAL SHORTNESS OF BREATH  *UNUSUAL BRUISING OR BLEEDING  TENDERNESS IN MOUTH AND THROAT WITH OR WITHOUT PRESENCE OF ULCERS  *URINARY PROBLEMS  *BOWEL PROBLEMS  UNUSUAL RASH Items with * indicate a potential emergency and should be followed up as soon as possible.  Feel free to call the clinic you have any questions or concerns. The clinic phone number is [phone].  Please show the CHEMO ALERT CARD at check-in to the Emergency Department and triage nurse.   

## 2015-02-16 ENCOUNTER — Ambulatory Visit (HOSPITAL_BASED_OUTPATIENT_CLINIC_OR_DEPARTMENT_OTHER): Payer: Medicare Other

## 2015-02-16 ENCOUNTER — Other Ambulatory Visit (HOSPITAL_BASED_OUTPATIENT_CLINIC_OR_DEPARTMENT_OTHER): Payer: Medicare Other

## 2015-02-16 VITALS — BP 170/65 | HR 64 | Temp 97.6°F | Resp 20

## 2015-02-16 DIAGNOSIS — C9000 Multiple myeloma not having achieved remission: Secondary | ICD-10-CM | POA: Diagnosis not present

## 2015-02-16 DIAGNOSIS — C9002 Multiple myeloma in relapse: Secondary | ICD-10-CM | POA: Diagnosis not present

## 2015-02-16 DIAGNOSIS — Z5112 Encounter for antineoplastic immunotherapy: Secondary | ICD-10-CM | POA: Diagnosis not present

## 2015-02-16 LAB — CBC WITH DIFFERENTIAL/PLATELET
BASO%: 0.2 % (ref 0.0–2.0)
Basophils Absolute: 0 10*3/uL (ref 0.0–0.1)
EOS%: 0 % (ref 0.0–7.0)
Eosinophils Absolute: 0 10*3/uL (ref 0.0–0.5)
HEMATOCRIT: 35.9 % (ref 34.8–46.6)
HGB: 11.7 g/dL (ref 11.6–15.9)
LYMPH%: 25.7 % (ref 14.0–49.7)
MCH: 31.9 pg (ref 25.1–34.0)
MCHC: 32.6 g/dL (ref 31.5–36.0)
MCV: 97.8 fL (ref 79.5–101.0)
MONO#: 0.1 10*3/uL (ref 0.1–0.9)
MONO%: 1.6 % (ref 0.0–14.0)
NEUT#: 3.6 10*3/uL (ref 1.5–6.5)
NEUT%: 72.5 % (ref 38.4–76.8)
PLATELETS: 173 10*3/uL (ref 145–400)
RBC: 3.67 10*6/uL — AB (ref 3.70–5.45)
RDW: 15.6 % — ABNORMAL HIGH (ref 11.2–14.5)
WBC: 4.9 10*3/uL (ref 3.9–10.3)
lymph#: 1.3 10*3/uL (ref 0.9–3.3)

## 2015-02-16 LAB — COMPREHENSIVE METABOLIC PANEL (CC13)
ALBUMIN: 3.8 g/dL (ref 3.5–5.0)
ALK PHOS: 61 U/L (ref 40–150)
ALT: 10 U/L (ref 0–55)
AST: 18 U/L (ref 5–34)
Anion Gap: 9 mEq/L (ref 3–11)
BUN: 23.9 mg/dL (ref 7.0–26.0)
CALCIUM: 10 mg/dL (ref 8.4–10.4)
CO2: 27 mEq/L (ref 22–29)
CREATININE: 1.2 mg/dL — AB (ref 0.6–1.1)
Chloride: 101 mEq/L (ref 98–109)
EGFR: 44 mL/min/{1.73_m2} — ABNORMAL LOW (ref >90)
Glucose: 147 mg/dl — ABNORMAL HIGH (ref 70–140)
Potassium: 4 mEq/L (ref 3.5–5.1)
Sodium: 137 mEq/L (ref 136–145)
Total Bilirubin: 0.4 mg/dL (ref 0.20–1.20)
Total Protein: 9 g/dL — ABNORMAL HIGH (ref 6.4–8.3)

## 2015-02-16 MED ORDER — ONDANSETRON HCL 8 MG PO TABS
ORAL_TABLET | ORAL | Status: AC
  Filled 2015-02-16: qty 1

## 2015-02-16 MED ORDER — BORTEZOMIB CHEMO SQ INJECTION 3.5 MG (2.5MG/ML)
1.3000 mg/m2 | Freq: Once | INTRAMUSCULAR | Status: AC
  Administered 2015-02-16: 2 mg via SUBCUTANEOUS
  Filled 2015-02-16: qty 2

## 2015-02-16 MED ORDER — ONDANSETRON HCL 8 MG PO TABS
8.0000 mg | ORAL_TABLET | Freq: Once | ORAL | Status: AC
  Administered 2015-02-16: 8 mg via ORAL

## 2015-02-16 NOTE — Patient Instructions (Signed)
[facility] Discharge Instructions for Patients Receiving Chemotherapy  Today you received the following chemotherapy agents:  Velcade  To help prevent nausea and vomiting after your treatment, we encourage you to take your nausea medication as prescribed.   If you develop nausea and vomiting that is not controlled by your nausea medication, call the clinic.   BELOW ARE SYMPTOMS THAT SHOULD BE REPORTED IMMEDIATELY:  *FEVER GREATER THAN 100.5 F  *CHILLS WITH OR WITHOUT FEVER  NAUSEA AND VOMITING THAT IS NOT CONTROLLED WITH YOUR NAUSEA MEDICATION  *UNUSUAL SHORTNESS OF BREATH  *UNUSUAL BRUISING OR BLEEDING  TENDERNESS IN MOUTH AND THROAT WITH OR WITHOUT PRESENCE OF ULCERS  *URINARY PROBLEMS  *BOWEL PROBLEMS  UNUSUAL RASH Items with * indicate a potential emergency and should be followed up as soon as possible.  Feel free to call the clinic you have any questions or concerns. The clinic phone number is [phone].  Please show the CHEMO ALERT CARD at check-in to the Emergency Department and triage nurse.   

## 2015-02-20 ENCOUNTER — Emergency Department (HOSPITAL_COMMUNITY): Payer: Medicare Other

## 2015-02-20 ENCOUNTER — Encounter (HOSPITAL_COMMUNITY): Payer: Self-pay | Admitting: Emergency Medicine

## 2015-02-20 ENCOUNTER — Observation Stay (HOSPITAL_COMMUNITY)
Admission: EM | Admit: 2015-02-20 | Discharge: 2015-02-23 | Disposition: A | Discharge status: Home / Self Care | Payer: Medicare Other | Attending: Internal Medicine | Admitting: Internal Medicine

## 2015-02-20 DIAGNOSIS — Z7982 Long term (current) use of aspirin: Secondary | ICD-10-CM | POA: Diagnosis not present

## 2015-02-20 DIAGNOSIS — Z79899 Other long term (current) drug therapy: Secondary | ICD-10-CM | POA: Insufficient documentation

## 2015-02-20 DIAGNOSIS — G8929 Other chronic pain: Secondary | ICD-10-CM | POA: Diagnosis not present

## 2015-02-20 DIAGNOSIS — Z8701 Personal history of pneumonia (recurrent): Secondary | ICD-10-CM | POA: Diagnosis not present

## 2015-02-20 DIAGNOSIS — E7800 Pure hypercholesterolemia, unspecified: Secondary | ICD-10-CM | POA: Diagnosis not present

## 2015-02-20 DIAGNOSIS — R109 Unspecified abdominal pain: Secondary | ICD-10-CM | POA: Insufficient documentation

## 2015-02-20 DIAGNOSIS — M109 Gout, unspecified: Secondary | ICD-10-CM | POA: Diagnosis not present

## 2015-02-20 DIAGNOSIS — R1110 Vomiting, unspecified: Secondary | ICD-10-CM | POA: Diagnosis not present

## 2015-02-20 DIAGNOSIS — J42 Unspecified chronic bronchitis: Secondary | ICD-10-CM | POA: Diagnosis not present

## 2015-02-20 DIAGNOSIS — E785 Hyperlipidemia, unspecified: Secondary | ICD-10-CM | POA: Diagnosis present

## 2015-02-20 DIAGNOSIS — R197 Diarrhea, unspecified: Secondary | ICD-10-CM | POA: Diagnosis not present

## 2015-02-20 DIAGNOSIS — J449 Chronic obstructive pulmonary disease, unspecified: Secondary | ICD-10-CM | POA: Diagnosis not present

## 2015-02-20 DIAGNOSIS — I25810 Atherosclerosis of coronary artery bypass graft(s) without angina pectoris: Secondary | ICD-10-CM | POA: Diagnosis not present

## 2015-02-20 DIAGNOSIS — K219 Gastro-esophageal reflux disease without esophagitis: Secondary | ICD-10-CM | POA: Diagnosis present

## 2015-02-20 DIAGNOSIS — R5383 Other fatigue: Secondary | ICD-10-CM | POA: Insufficient documentation

## 2015-02-20 DIAGNOSIS — R112 Nausea with vomiting, unspecified: Secondary | ICD-10-CM

## 2015-02-20 DIAGNOSIS — I10 Essential (primary) hypertension: Secondary | ICD-10-CM | POA: Insufficient documentation

## 2015-02-20 DIAGNOSIS — R509 Fever, unspecified: Secondary | ICD-10-CM | POA: Diagnosis present

## 2015-02-20 DIAGNOSIS — I4891 Unspecified atrial fibrillation: Secondary | ICD-10-CM | POA: Diagnosis not present

## 2015-02-20 DIAGNOSIS — Z87891 Personal history of nicotine dependence: Secondary | ICD-10-CM | POA: Insufficient documentation

## 2015-02-20 DIAGNOSIS — Z8579 Personal history of other malignant neoplasms of lymphoid, hematopoietic and related tissues: Secondary | ICD-10-CM | POA: Insufficient documentation

## 2015-02-20 LAB — CBC WITH DIFFERENTIAL/PLATELET
BASOS PCT: 0 %
Basophils Absolute: 0 10*3/uL (ref 0.0–0.1)
Eosinophils Absolute: 0 10*3/uL (ref 0.0–0.7)
Eosinophils Relative: 1 %
HEMATOCRIT: 36.2 % (ref 36.0–46.0)
HEMOGLOBIN: 11.7 g/dL — AB (ref 12.0–15.0)
LYMPHS PCT: 46 %
Lymphs Abs: 1.9 10*3/uL (ref 0.7–4.0)
MCH: 31.1 pg (ref 26.0–34.0)
MCHC: 32.3 g/dL (ref 30.0–36.0)
MCV: 96.3 fL (ref 78.0–100.0)
MONO ABS: 0.5 10*3/uL (ref 0.1–1.0)
MONOS PCT: 12 %
NEUTROS ABS: 1.7 10*3/uL (ref 1.7–7.7)
NEUTROS PCT: 41 %
Platelets: 142 10*3/uL — ABNORMAL LOW (ref 150–400)
RBC: 3.76 MIL/uL — ABNORMAL LOW (ref 3.87–5.11)
RDW: 15.2 % (ref 11.5–15.5)
WBC: 4.1 10*3/uL (ref 4.0–10.5)

## 2015-02-20 LAB — URINALYSIS, ROUTINE W REFLEX MICROSCOPIC
Bilirubin Urine: NEGATIVE
GLUCOSE, UA: NEGATIVE mg/dL
Hgb urine dipstick: NEGATIVE
KETONES UR: NEGATIVE mg/dL
NITRITE: NEGATIVE
PH: 7 (ref 5.0–8.0)
Protein, ur: 100 mg/dL — AB
SPECIFIC GRAVITY, URINE: 1.011 (ref 1.005–1.030)

## 2015-02-20 LAB — URINE MICROSCOPIC-ADD ON: RBC / HPF: NONE SEEN RBC/hpf (ref 0–5)

## 2015-02-20 LAB — COMPREHENSIVE METABOLIC PANEL
ALBUMIN: 3.8 g/dL (ref 3.5–5.0)
ALK PHOS: 61 U/L (ref 38–126)
ALT: 16 U/L (ref 14–54)
AST: 27 U/L (ref 15–41)
Anion gap: 8 (ref 5–15)
BUN: 18 mg/dL (ref 6–20)
CHLORIDE: 103 mmol/L (ref 101–111)
CO2: 27 mmol/L (ref 22–32)
Calcium: 9.4 mg/dL (ref 8.9–10.3)
Creatinine, Ser: 0.92 mg/dL (ref 0.44–1.00)
GFR calc non Af Amer: 57 mL/min — ABNORMAL LOW (ref >60)
GLUCOSE: 104 mg/dL — AB (ref 65–99)
POTASSIUM: 3.6 mmol/L (ref 3.5–5.1)
SODIUM: 138 mmol/L (ref 135–145)
Total Bilirubin: 0.7 mg/dL (ref 0.3–1.2)
Total Protein: 8.4 g/dL — ABNORMAL HIGH (ref 6.5–8.1)

## 2015-02-20 LAB — LIPASE, BLOOD: LIPASE: 26 U/L (ref 11–51)

## 2015-02-20 LAB — I-STAT CG4 LACTIC ACID, ED
LACTIC ACID, VENOUS: 0.65 mmol/L (ref 0.5–2.0)
Lactic Acid, Venous: 0.99 mmol/L (ref 0.5–2.0)

## 2015-02-20 MED ORDER — SODIUM CHLORIDE 0.9 % IV BOLUS (SEPSIS)
1000.0000 mL | Freq: Once | INTRAVENOUS | Status: AC
  Administered 2015-02-20: 1000 mL via INTRAVENOUS

## 2015-02-20 MED ORDER — SODIUM CHLORIDE 0.9 % IV SOLN
Freq: Once | INTRAVENOUS | Status: AC
  Administered 2015-02-20: 23:00:00 via INTRAVENOUS

## 2015-02-20 MED ORDER — IOHEXOL 300 MG/ML  SOLN
100.0000 mL | Freq: Once | INTRAMUSCULAR | Status: AC | PRN
  Administered 2015-02-20: 100 mL via INTRAVENOUS

## 2015-02-20 MED ORDER — ONDANSETRON HCL 4 MG/2ML IJ SOLN
4.0000 mg | Freq: Once | INTRAMUSCULAR | Status: AC
  Administered 2015-02-20: 4 mg via INTRAVENOUS
  Filled 2015-02-20: qty 2

## 2015-02-20 NOTE — ED Notes (Signed)
Pt called out to go to the bathroom.  Pt informed we needed a sample she acknowledged same.  Pt stated she would have to use a hat to be able to provide a sample.  Pt reminded that she needed a clean sample without stool as had happened earlier.  Pt still got stool in her sample.  Pt was informed that perhaps an catheretization would possibly be requires to obtain the urine.  Pt stated she would not consent to a catheretization.

## 2015-02-20 NOTE — ED Notes (Signed)
Pt has hx of multiple myeloma and gets chemo shots in her abdomen. Last treatment was on Friday. Pt c/o N/V/D, fever since Saturday. Pt also c/o SOB since this afternoon. A&Ox4 and ambulatory with assistance. Pt also c/o abdominal pain. No fever at this time. Pt has not checked temperature at home. Pt sts "I just knew I had a fever."

## 2015-02-20 NOTE — H&P (Signed)
[facility] History and Physical  RAMYA VANBERGEN XTK:[id number] DOB: 1932-01-11 DOA: 02/20/2015  Referring physician: Luster Landsberg, MD PCP: [name], MD   Chief Complaint: Diarrhea  HPI: [name] is a 81 y.o. female with prior history of HTN HLD CAD Multiple myeloma Atrial Fibrillation GERD presents with nausea vomiting and diarrhea. She states that she started not feeling well over th last couple of weeks. She has had episodes of being sick on her stomach with vomiting and diarrhea. Patient has not had any fevers. She thinks she may have had a fever today but did not actually measure a temperature. Patient has associated no belly pain with the diarrhea. She has had no cramping. She has more back pain than anything else. Patient states that she does not recall eating anything that may have percipitated this. She states that she has not had much of an appetite. She has multiple myeloma and she has been getting chemoterapy every Friday she states. She has been on chemotherapy since July. Patient has not any issues with nausea or vomiting with getting the chemotherapy. Patient states that she has no blood in her stools. She states that she has blood in her vomit. She was on a blood thinner in June and she had noted blood in her stools at that time.    Review of Systems:  Complete ROS performed and is unremarkable other than HPI.   Past Medical History  Diagnosis Date  . COPD (chronic obstructive pulmonary disease) ([place])   . Gout   . Coronary atherosclerosis of native coronary artery 02/05/2012  . Hypertension   . High blood cholesterol   . Heart murmur     "all my life" (02/05/2012)  . Anginal pain ([place])   . Myocardial infarction ([place]) 1990's; 2000's    "2 total, I think" (02/05/2012)  . Pneumonia     "several times" (02/05/2012)  . Chronic bronchitis ([place])   . Shortness of breath     "sometimes when I'm laying down; always when I do too much" (02/05/2012)    . History of blood transfusion   . External bleeding hemorrhoids     "act up at times" (02/05/2012)  . Chronic back pain     "all over" (02/05/2012)  . Depression   . CAD (coronary artery disease) of artery bypass graft 02/09/14    atretic LIMA  . Multiple myeloma ([place]) 09/07/2014  . A-fib ([place]) 10/09/2014   Past Surgical History  Procedure Laterality Date  . Appendectomy  ? 1970's  . Ectopic pregnancy surgery  1970's  . Abdominal hysterectomy  ? 1970's    "partial 1st time; complete 2nd" (02/05/2012)  . Cholecystectomy  ~ 2010  . Cataract extraction w/ intraocular lens  implant, bilateral  [phone]  . Coronary angioplasty with stent placement  2005  . Coronary artery bypass graft  2005    CABG X2  . Cardiac catheterization  02/09/14    atretic LIMA,  patent VG-dRCA and Total occlusion of the native RCA proximally prior to remotely placement RCA stent, with mild proximal 30% LAD stenosis and 30% distal circumflex stenoses.  EF 55%.  . Left heart catheterization with coronary/graft angiogram N/A 02/09/2014    Procedure: LEFT HEART CATHETERIZATION WITH Beatrix Fetters;  Surgeon: Troy Sine, MD;  [facility] C[facility];  Service: Cardiovascular;  Laterality: N/A;  . Esophagogastroduodenoscopy (egd) with propofol Left 05/22/2014    Procedure: ESOPHAGOGASTRODUODENOSCOPY (EGD) WITH PROPOFOL;  Surgeon: Arta Silence, MD;  Location: [facility];  Service: Endoscopy;  Laterality: Left;   Social History:  reports that she quit smoking about 3 years ago. Her smoking use included Cigarettes. She has a 50 pack-year smoking history. She has never used smokeless tobacco. She reports that she drinks alcohol. She reports that she does not use illicit drugs.  Allergies  Allergen Reactions  . Lexapro [Escitalopram Oxalate] Other (See Comments)    Fatigue   . Soma [Carisoprodol] Other (See Comments)    Fatigue   . Wellbutrin [Bupropion] Other (See Comments)    "couldn't function and  take it"  . Albuterol Sulfate Other (See Comments)    Albuterol HFA inhaler caused nervousness   . Codeine Hives  . Levaquin [Levofloxacin In D5w] Other (See Comments)    Unknown allergic reaction  . Plavix [Clopidogrel Bisulfate] Other (See Comments)    Unknown reaction  . Statins     Unknown reaction     Family History  Problem Relation Age of Onset  . Anemia Neg Hx   . Arrhythmia Neg Hx   . Asthma Neg Hx   . Clotting disorder Neg Hx   . Fainting Neg Hx   . Heart attack Neg Hx   . Heart disease Neg Hx   . Heart failure Neg Hx   . Hyperlipidemia Neg Hx   . Hypertension Neg Hx   . Aneurysm    . CVA    . CVA Mother   . Aneurysm Father      Prior to Admission medications   Medication Sig Start Date End Date Taking? Authorizing Provider  acetaminophen (TYLENOL) 325 MG tablet Take 2 tablets (650 mg total) by mouth every 6 (six) hours as needed for mild pain (or Fever >/= 101). 09/13/14  Yes Reyne Dumas, MD  acyclovir (ZOVIRAX) 400 MG tablet Take 1 tablet (400 mg total) by mouth daily. 10/27/14  Yes Carlton Adam, PA-C  albuterol-ipratropium (COMBIVENT) 18-103 MCG/ACT inhaler Inhale 1-2 puffs into the lungs every 4 (four) hours as needed for wheezing or shortness of breath.   Yes Historical Provider, MD  aspirin 325 MG tablet Take 325 mg by mouth daily.   Yes Historical Provider, MD  cholecalciferol (VITAMIN D) 1000 UNITS tablet Take 1,000 Units by mouth every morning.    Yes Historical Provider, MD  dexamethasone (DECADRON) 4 MG tablet Take 5 tablets every week before chemotherapy injection. 10/30/14  Yes Carlton Adam, PA-C  ezetimibe (ZETIA) 10 MG tablet Take 1 tablet (10 mg total) by mouth at bedtime. 05/25/14  Yes Hosie Poisson, MD  feeding supplement, ENSURE ENLIVE, (ENSURE ENLIVE) LIQD Take 237 mLs by mouth 3 (three) times daily between meals. 09/13/14  Yes Reyne Dumas, MD  isosorbide mononitrate (IMDUR) 30 MG 24 hr tablet Take 1 tablet (30 mg total) by mouth daily. 12/26/14   Yes Belva Crome, MD  metoprolol succinate (TOPROL-XL) 25 MG 24 hr tablet Take 1 tablet (25 mg total) by mouth daily. 12/26/14  Yes Belva Crome, MD  nitroGLYCERIN (NITROSTAT) 0.4 MG SL tablet Place 0.4 mg under the tongue every 5 (five) minutes as needed for chest pain.   Yes Historical Provider, MD  Omega-3 Fatty Acids (FISH OIL) 1200 MG CAPS Take 1,200-2,400 mg by mouth 2 (two) times daily. Take 2 capsules (2400 mg) every morning and 1 capsule (1200 mg) every night   Yes Historical Provider, MD  pantoprazole (PROTONIX) 40 MG tablet Take 1 tablet (40 mg total) by mouth 2 (two) times daily. 05/25/14  Yes Vijaya  Karleen Hampshire, MD  polyethylene glycol powder (GLYCOLAX/MIRALAX) powder Take 17 g by mouth every morning. Mix with 8 oz liquid and drink 05/16/14  Yes Historical Provider, MD  [place]   Yes Historical Provider, MD  prochlorperazine (COMPAZINE) 10 MG tablet Take 10 mg by mouth every 8 (eight) hours as needed for nausea.  08/02/14  Yes Historical Provider, MD  tiotropium (SPIRIVA) 18 MCG inhalation capsule Place 18 mcg into inhaler and inhale daily as needed (shortness of breath).    Yes Historical Provider, MD  traMADol (ULTRAM) 50 MG tablet Take 50 mg by mouth every 6 (six) hours as needed for moderate pain.  01/17/15  Yes Historical Provider, MD   Physical Exam: Filed Vitals:   02/20/15 1812 02/20/15 2137  BP: 191/100 209/95  Pulse: 71 54  Temp: 97.5 F (36.4 C)   TempSrc: Oral   Resp: 18 19  SpO2: 94% 98%    Wt Readings from Last 3 Encounters:  02/09/15 59.421 kg (131 lb)  01/05/15 60.737 kg (133 lb 14.4 oz)  12/26/14 60.056 kg (132 lb 6.4 oz)    General:  Appears calm and comfortable Eyes: PERRL, normal lids, irises & conjunctiva ENT: grossly normal hearing, lips & tongue Neck: no LAD, masses or thyromegaly Cardiovascular: RRR, no m/r/g. No LE edema. Respiratory: CTA bilaterally, no w/r/r. Normal respiratory effort. Abdomen: soft, ntnd Skin: no rash or  induration seen on limited exam Musculoskeletal: grossly normal tone BUE/BLE Psychiatric: grossly normal mood and affect Neurologic: grossly non-focal.          Labs on Admission:  Basic Metabolic Panel:  Recent Labs Lab 02/16/15 1510 02/20/15 1829  NA 137 138  K 4.0 3.6  CL  --  103  CO2 27 27  GLUCOSE 147* 104*  BUN 23.9 18  CREATININE 1.2* 0.92  CALCIUM 10.0 9.4   Liver Function Tests:  Recent Labs Lab 02/16/15 1510 02/20/15 1829  AST 18 27  ALT 10 16  ALKPHOS 61 61  BILITOT 0.40 0.7  PROT 9.0* 8.4*  ALBUMIN 3.8 3.8    Recent Labs Lab 02/20/15 1829  LIPASE 26   No results for input(s): AMMONIA in the last 168 hours. CBC:  Recent Labs Lab 02/16/15 1510 02/20/15 1829  WBC 4.9 4.1  NEUTROABS 3.6 1.7  HGB 11.7 11.7*  HCT 35.9 36.2  MCV 97.8 96.3  PLT 173 142*   Cardiac Enzymes: No results for input(s): CKTOTAL, CKMB, CKMBINDEX, TROPONINI in the last 168 hours.  BNP (last 3 results)  Recent Labs  05/23/14 1445 08/13/14 1216 10/09/14 1524  BNP 537.1* 150.0* 537.4*    ProBNP (last 3 results) No results for input(s): PROBNP in the last 8760 hours.  CBG: No results for input(s): GLUCAP in the last 168 hours.  Radiological Exams on Admission: Dg Chest 2 View  02/20/2015  CLINICAL DATA:  Subacute onset of nausea and vomiting. Acute onset of diarrhea and fever. Initial encounter. EXAM: CHEST  2 VIEW COMPARISON:  Chest radiograph from 12/26/2014 FINDINGS: The lungs are well-aerated and clear. There is no evidence of focal opacification, pleural effusion or pneumothorax. The heart is borderline normal in size. The patient is status post median sternotomy, with evidence of prior CABG. No acute osseous abnormalities are seen. Clips are noted within the right upper quadrant, reflecting prior cholecystectomy. IMPRESSION: No acute cardiopulmonary process seen. Electronically Signed   By: Garald Balding M.D.   On: 02/20/2015 19:28   Ct Abdomen Pelvis W  Contrast  02/20/2015  CLINICAL DATA:  Acute onset of generalized abdominal pain, nausea, vomiting, diarrhea and fever. Patient on chemotherapy for multiple myeloma. Initial encounter. EXAM: CT ABDOMEN AND PELVIS WITH CONTRAST TECHNIQUE: Multidetector CT imaging of the abdomen and pelvis was performed using the standard protocol following bolus administration of intravenous contrast. CONTRAST:  126m OMNIPAQUE IOHEXOL 300 MG/ML  SOLN COMPARISON:  CT of the abdomen and pelvis performed 05/20/2014 FINDINGS: The visualized lung bases are clear. The patient is status post median sternotomy. Diffuse coronary artery calcifications are seen. The liver and spleen are unremarkable in appearance. The patient is status post cholecystectomy, with clips noted at the gallbladder fossa. The pancreas and adrenal glands are unremarkable. A few small bilateral renal cysts are noted. The kidneys are otherwise unremarkable. There is no evidence of hydronephrosis. No renal or ureteral stones are seen. No perinephric stranding is appreciated. No free fluid is identified. The small bowel is unremarkable in appearance. The stomach is within normal limits. No acute vascular abnormalities are seen. Diffuse calcification is noted along the abdominal aorta and its branches. Mild wall thickening is noted along the cecum and ascending colon, and there is question of mild wall thickening along the sigmoid colon with associated soft tissue inflammation, concerning for acute infectious or inflammatory colitis. The patient is status post appendectomy. The bladder is decompressed and not well assessed. The patient is status post hysterectomy. No suspicious adnexal masses are seen. No inguinal lymphadenopathy is seen. No acute osseous abnormalities are identified. There is minimal grade 1 anterolisthesis of L4 on L5, reflecting underlying facet disease. Intervertebral disc space narrowing is noted at L5-S1. IMPRESSION: 1. Mild wall thickening along  the cecum and ascending colon, and question of mild wall thickening along the sigmoid colon, with associated soft tissue inflammation, concerning for acute infectious or inflammatory colitis. 2. Few small bilateral renal cysts noted. 3. Diffuse coronary artery calcifications seen. 4. Diffuse calcification along the abdominal aorta and its branches. Electronically Signed   By: JGarald BaldingM.D.   On: 02/20/2015 22:02      Assessment/Plan Principal Problem:   Nausea vomiting and diarrhea Active Problems:   Dyslipidemia   GERD (gastroesophageal reflux disease)   COPD (chronic obstructive pulmonary disease) ([place]   Hypertension   1. Diarrhea Nausea and vomiting -will be admitted for observation -will start on IVF now -will hold off on antibiotics as patient has no documented fever and no elevation in WBC -she reports an allergy to levaquin -stool collected and sent in the ED -will check C diff  2. HLD -will check lipid panel -has an allergy to statins  3. HTN -pressures elevated -will resume her home meds -Monitor pressures  4. GERD -will continue with PPI  5. CAD -will continue with current medications -no active chest pain  6. COPD -will continue with inhalers  7. Multiple Myeloma -she is on chemo every Friday      Code Status: full code (must indicate code status--if unknown or must be presumed, indicate so) DVT Prophylaxis:SCD Family Communication: none (indicate person spoken with, if applicable, with phone number if by telephone) Disposition Plan: home (indicate anticipated LOS)   [facility] Pager [phone]

## 2015-02-20 NOTE — ED Provider Notes (Addendum)
CSN: [id number]     Arrival date & time 02/20/15  1802 History   First MD Initiated Contact with Patient 02/20/15 [place] Complaint  Patient presents with  . Cancer  . Emesis  . Fever     (Consider location/radiation/quality/duration/timing/severity/associated sxs/prior Treatment) HPI   Patient's 81 year old female with very long,past medical history including multiple myeloma, COPD, CAD, chronic pain. Patient's presenting today with a day and a half of nausea vomiting diarrhea. Patient lives with her daughter. Her daughter stated that she didn't know about this until today. Patient states she has vomited and had diarrhea several episodes. Patient states she's been feeling weak for the last 2 weeks. She says she's been seen her multiple physicians (including oncology) and they cannot find out what is causing her to be weak.  She said she thinks had a fever at home but was unable to measure it. She has not taking anything to help at home. He said she gets lightheaded when she stands up quickly.  Past Medical History  Diagnosis Date  . COPD (chronic obstructive pulmonary disease) ([place])   . Gout   . Coronary atherosclerosis of native coronary artery 02/05/2012  . Hypertension   . High blood cholesterol   . Heart murmur     "all my life" (02/05/2012)  . Anginal pain ([place])   . Myocardial infarction ([place]) 1990's; 2000's    "2 total, I think" (02/05/2012)  . Pneumonia     "several times" (02/05/2012)  . Chronic bronchitis ([place])   . Shortness of breath     "sometimes when I'm laying down; always when I do too much" (02/05/2012)  . History of blood transfusion   . External bleeding hemorrhoids     "act up at times" (02/05/2012)  . Chronic back pain     "all over" (02/05/2012)  . Depression   . CAD (coronary artery disease) of artery bypass graft 02/09/14    atretic LIMA  . Multiple myeloma ([place]) 09/07/2014  . A-fib ([place]) 10/09/2014   Past Surgical History  Procedure  Laterality Date  . Appendectomy  ? 1970's  . Ectopic pregnancy surgery  1970's  . Abdominal hysterectomy  ? 1970's    "partial 1st time; complete 2nd" (02/05/2012)  . Cholecystectomy  ~ 2010  . Cataract extraction w/ intraocular lens  implant, bilateral  [phone]  . Coronary angioplasty with stent placement  2005  . Coronary artery bypass graft  2005    CABG X2  . Cardiac catheterization  02/09/14    atretic LIMA,  patent VG-dRCA and Total occlusion of the native RCA proximally prior to remotely placement RCA stent, with mild proximal 30% LAD stenosis and 30% distal circumflex stenoses.  EF 55%.  . Left heart catheterization with coronary/graft angiogram N/A 02/09/2014    Procedure: LEFT HEART CATHETERIZATION WITH Beatrix Fetters;  Surgeon: Troy Sine, MD;  Location[facility] CATH LAB;  Service: Cardiovascular;  Laterality: N/A;  . Esophagogastroduodenoscopy (egd) with propofol Left 05/22/2014    Procedure: ESOPHAGOGASTRODUODENOSCOPY (EGD) WITH PROPOFOL;  Surgeon: Arta Silence, MD;  Location[facility] ENDOSCOPY;  Service: Endoscopy;  Laterality: Left;   Family History  Problem Relation Age of Onset  . Anemia Neg Hx   . Arrhythmia Neg Hx   . Asthma Neg Hx   . Clotting disorder Neg Hx   . Fainting Neg Hx   . Heart attack Neg Hx   . Heart disease Neg Hx   . Heart failure Neg Hx   .  Hyperlipidemia Neg Hx   . Hypertension Neg Hx   . Aneurysm    . CVA    . CVA Mother   . Aneurysm Father    Social History  Substance Use Topics  . Smoking status: Former Smoker -- 1.00 packs/day for 50 years    Types: Cigarettes    Quit date: 04/02/2011  . Smokeless tobacco: Never Used  . Alcohol Use: Yes     Comment: says she drinks wine once in a while   OB History    No data available     Review of Systems  Constitutional: Positive for fatigue. Negative for fever and activity change.  HENT: Negative for congestion.   Eyes: Negative for discharge.  Respiratory: Negative for cough and  chest tightness.   Cardiovascular: Negative for chest pain.  Gastrointestinal: Positive for vomiting, abdominal pain and diarrhea. Negative for abdominal distention.  Genitourinary: Negative for dysuria.  Musculoskeletal: Negative for joint swelling.  Skin: Negative for rash.  Neurological: Negative for seizures.  Psychiatric/Behavioral: Negative for agitation.      Allergies  Lexapro; Soma; Wellbutrin; Albuterol sulfate; Codeine; Levaquin; Plavix; and Statins  Home Medications   Prior to Admission medications   Medication Sig Start Date End Date Taking? Authorizing Provider  acetaminophen (TYLENOL) 325 MG tablet Take 2 tablets (650 mg total) by mouth every 6 (six) hours as needed for mild pain (or Fever >/= 101). 09/13/14  Yes Reyne Dumas, MD  acyclovir (ZOVIRAX) 400 MG tablet Take 1 tablet (400 mg total) by mouth daily. 10/27/14  Yes Carlton Adam, PA-C  albuterol-ipratropium (COMBIVENT) 18-103 MCG/ACT inhaler Inhale 1-2 puffs into the lungs every 4 (four) hours as needed for wheezing or shortness of breath.   Yes Historical Provider, MD  aspirin 325 MG tablet Take 325 mg by mouth daily.   Yes Historical Provider, MD  cholecalciferol (VITAMIN D) 1000 UNITS tablet Take 1,000 Units by mouth every morning.    Yes Historical Provider, MD  dexamethasone (DECADRON) 4 MG tablet Take 5 tablets every week before chemotherapy injection. 10/30/14  Yes Carlton Adam, PA-C  ezetimibe (ZETIA) 10 MG tablet Take 1 tablet (10 mg total) by mouth at bedtime. 05/25/14  Yes Hosie Poisson, MD  feeding supplement, ENSURE ENLIVE, (ENSURE ENLIVE) LIQD Take 237 mLs by mouth 3 (three) times daily between meals. 09/13/14  Yes Reyne Dumas, MD  isosorbide mononitrate (IMDUR) 30 MG 24 hr tablet Take 1 tablet (30 mg total) by mouth daily. 12/26/14  Yes Belva Crome, MD  metoprolol succinate (TOPROL-XL) 25 MG 24 hr tablet Take 1 tablet (25 mg total) by mouth daily. 12/26/14  Yes Belva Crome, MD  nitroGLYCERIN  (NITROSTAT) 0.4 MG SL tablet Place 0.4 mg under the tongue every 5 (five) minutes as needed for chest pain.   Yes Historical Provider, MD  Omega-3 Fatty Acids (FISH OIL) 1200 MG CAPS Take 1,200-2,400 mg by mouth 2 (two) times daily. Take 2 capsules (2400 mg) every morning and 1 capsule (1200 mg) every night   Yes Historical Provider, MD  pantoprazole (PROTONIX) 40 MG tablet Take 1 tablet (40 mg total) by mouth 2 (two) times daily. 05/25/14  Yes Hosie Poisson, MD  polyethylene glycol powder (GLYCOLAX/MIRALAX) powder Take 17 g by mouth every morning. Mix with 8 oz liquid and drink 05/16/14  Yes Historical Provider, MD  [place]   Yes Historical Provider, MD  prochlorperazine (COMPAZINE) 10 MG tablet Take 10 mg by mouth every 8 (eight)  hours as needed for nausea.  08/02/14  Yes Historical Provider, MD  tiotropium (SPIRIVA) 18 MCG inhalation capsule Place 18 mcg into inhaler and inhale daily as needed (shortness of breath).    Yes Historical Provider, MD  traMADol (ULTRAM) 50 MG tablet Take 50 mg by mouth every 6 (six) hours as needed for moderate pain.  01/17/15  Yes Historical Provider, MD   BP 209/95 mmHg  Pulse 54  Temp(Src) 97.5 F (36.4 C) (Oral)  Resp 19  SpO2 98% Physical Exam  Constitutional: She is oriented to person, place, and time. She appears well-developed and well-nourished.  Pale  HENT:  Head: Normocephalic and atraumatic.  Eyes: Conjunctivae are normal. Right eye exhibits no discharge.  Neck: Neck supple.  Cardiovascular: Normal rate, regular rhythm and normal heart sounds.   No murmur heard. Pulmonary/Chest: Effort normal and breath sounds normal. She has no wheezes. She has no rales.  Abdominal: Soft. She exhibits no distension. There is tenderness.  Patient is diffuse nonfocal belly tenderness.  Musculoskeletal: Normal range of motion.  Neurological: She is oriented to person, place, and time. No cranial nerve deficit.  Skin: Skin is warm and dry.  No rash noted. She is not diaphoretic.  Psychiatric: Thought content normal.  Nursing note and vitals reviewed.   ED Course  Procedures (including critical care time) Labs Review Labs Reviewed  COMPREHENSIVE METABOLIC PANEL - Abnormal; Notable for the following:    Glucose, Bld 104 (*)    Total Protein 8.4 (*)    GFR calc non Af Amer 57 (*)    All other components within normal limits  URINALYSIS, ROUTINE W REFLEX MICROSCOPIC (NOT AT [facility]) - Abnormal; Notable for the following:    Protein, ur 100 (*)    Leukocytes, UA TRACE (*)    All other components within normal limits  CBC WITH DIFFERENTIAL/PLATELET - Abnormal; Notable for the following:    RBC 3.76 (*)    Hemoglobin 11.7 (*)    Platelets 142 (*)    All other components within normal limits  URINE MICROSCOPIC-ADD ON - Abnormal; Notable for the following:    Squamous Epithelial / LPF 0-5 (*)    Bacteria, UA MANY (*)    All other components within normal limits  URINE CULTURE  LIPASE, BLOOD  I-STAT CG4 LACTIC ACID, ED  I-STAT CG4 LACTIC ACID, ED    Imaging Review Dg Chest 2 View  02/20/2015  CLINICAL DATA:  Subacute onset of nausea and vomiting. Acute onset of diarrhea and fever. Initial encounter. EXAM: CHEST  2 VIEW COMPARISON:  Chest radiograph from 12/26/2014 FINDINGS: The lungs are well-aerated and clear. There is no evidence of focal opacification, pleural effusion or pneumothorax. The heart is borderline normal in size. The patient is status post median sternotomy, with evidence of prior CABG. No acute osseous abnormalities are seen. Clips are noted within the right upper quadrant, reflecting prior cholecystectomy. IMPRESSION: No acute cardiopulmonary process seen. Electronically Signed   By: Garald Balding M.D.   On: 02/20/2015 19:28   Ct Abdomen Pelvis W Contrast  02/20/2015  CLINICAL DATA:  Acute onset of generalized abdominal pain, nausea, vomiting, diarrhea and fever. Patient on chemotherapy for multiple myeloma.  Initial encounter. EXAM: CT ABDOMEN AND PELVIS WITH CONTRAST TECHNIQUE: Multidetector CT imaging of the abdomen and pelvis was performed using the standard protocol following bolus administration of intravenous contrast. CONTRAST:  146mL OMNIPAQUE IOHEXOL 300 MG/ML  SOLN COMPARISON:  CT of the abdomen and pelvis performed 05/20/2014 FINDINGS:  The visualized lung bases are clear. The patient is status post median sternotomy. Diffuse coronary artery calcifications are seen. The liver and spleen are unremarkable in appearance. The patient is status post cholecystectomy, with clips noted at the gallbladder fossa. The pancreas and adrenal glands are unremarkable. A few small bilateral renal cysts are noted. The kidneys are otherwise unremarkable. There is no evidence of hydronephrosis. No renal or ureteral stones are seen. No perinephric stranding is appreciated. No free fluid is identified. The small bowel is unremarkable in appearance. The stomach is within normal limits. No acute vascular abnormalities are seen. Diffuse calcification is noted along the abdominal aorta and its branches. Mild wall thickening is noted along the cecum and ascending colon, and there is question of mild wall thickening along the sigmoid colon with associated soft tissue inflammation, concerning for acute infectious or inflammatory colitis. The patient is status post appendectomy. The bladder is decompressed and not well assessed. The patient is status post hysterectomy. No suspicious adnexal masses are seen. No inguinal lymphadenopathy is seen. No acute osseous abnormalities are identified. There is minimal grade 1 anterolisthesis of L4 on L5, reflecting underlying facet disease. Intervertebral disc space narrowing is noted at L5-S1. IMPRESSION: 1. Mild wall thickening along the cecum and ascending colon, and question of mild wall thickening along the sigmoid colon, with associated soft tissue inflammation, concerning for acute infectious  or inflammatory colitis. 2. Few small bilateral renal cysts noted. 3. Diffuse coronary artery calcifications seen. 4. Diffuse calcification along the abdominal aorta and its branches. Electronically Signed   By: Garald Balding M.D.   On: 02/20/2015 22:02   I have personally reviewed and evaluated these images and lab results as part of my medical decision-making.   EKG Interpretation   Date/Time:  Tuesday February 20 2015 18:21:47 EST Ventricular Rate:  64 PR Interval:  209 QRS Duration: 98 QT Interval:  446 QTC Calculation: 460 R Axis:   115 Text Interpretation:  Sinus rhythm Ventricular premature complex Right  axis deviation Borderline T wave abnormalities no acute ischemia Confirmed  by Gerald Leitz ([id number]) on 02/20/2015 8:13:06 PM      MDM   Final diagnoses:  None   Patient is 81 year old female in no acute distress presenting today with nausea vomiting diarrhea for the last day and a half. Patient has normal vital signs on arrival. Her physical exam significant for mild abdominal pain with dry mucus membranes. Patient reports lightheadedness upon standing. We will give fluids, get labs.  10:43 PM Patient is continued to have multiple episodes of diarrhea while here. Given that she is an 81 year old female and she has colitis. I think that she would benefit from fluid resuscitation, observation overnight.   11:13 PM Patient unable to tolerate home BP meds. Will treat with IV BP meds.   Dawid Dupriest Julio Alm, MD 02/20/15 [place], MD 02/20/15 [id number]

## 2015-02-20 NOTE — ED Notes (Signed)
Pt went to the bathroom to give a urine sample, but pt had a bowel movement also in the urine hat so we were unable to get the sample. Pt is alert and knows when she needs to go so she said she would try again shortly.

## 2015-02-21 DIAGNOSIS — R112 Nausea with vomiting, unspecified: Secondary | ICD-10-CM

## 2015-02-21 DIAGNOSIS — J42 Unspecified chronic bronchitis: Secondary | ICD-10-CM

## 2015-02-21 DIAGNOSIS — I10 Essential (primary) hypertension: Secondary | ICD-10-CM

## 2015-02-21 DIAGNOSIS — K219 Gastro-esophageal reflux disease without esophagitis: Secondary | ICD-10-CM

## 2015-02-21 DIAGNOSIS — E785 Hyperlipidemia, unspecified: Secondary | ICD-10-CM

## 2015-02-21 DIAGNOSIS — R197 Diarrhea, unspecified: Secondary | ICD-10-CM

## 2015-02-21 LAB — CBC
HCT: 29.2 % — ABNORMAL LOW (ref 36.0–46.0)
HEMOGLOBIN: 9.7 g/dL — AB (ref 12.0–15.0)
MCH: 31.3 pg (ref 26.0–34.0)
MCHC: 33.2 g/dL (ref 30.0–36.0)
MCV: 94.2 fL (ref 78.0–100.0)
PLATELETS: 116 10*3/uL — AB (ref 150–400)
RBC: 3.1 MIL/uL — AB (ref 3.87–5.11)
RDW: 15.3 % (ref 11.5–15.5)
WBC: 5.4 10*3/uL (ref 4.0–10.5)

## 2015-02-21 LAB — C DIFFICILE QUICK SCREEN W PCR REFLEX
C DIFFICILE (CDIFF) TOXIN: NEGATIVE
C DIFFICLE (CDIFF) ANTIGEN: POSITIVE — AB

## 2015-02-21 LAB — COMPREHENSIVE METABOLIC PANEL
ALBUMIN: 3.2 g/dL — AB (ref 3.5–5.0)
ALK PHOS: 51 U/L (ref 38–126)
ALT: 13 U/L — AB (ref 14–54)
AST: 20 U/L (ref 15–41)
Anion gap: 4 — ABNORMAL LOW (ref 5–15)
BUN: 13 mg/dL (ref 6–20)
CALCIUM: 8.4 mg/dL — AB (ref 8.9–10.3)
CHLORIDE: 105 mmol/L (ref 101–111)
CO2: 26 mmol/L (ref 22–32)
Creatinine, Ser: 0.82 mg/dL (ref 0.44–1.00)
GFR calc Af Amer: >60 mL/min (ref >60)
GFR calc non Af Amer: >60 mL/min (ref >60)
GLUCOSE: 107 mg/dL — AB (ref 65–99)
Potassium: 3.1 mmol/L — ABNORMAL LOW (ref 3.5–5.1)
SODIUM: 135 mmol/L (ref 135–145)
Total Bilirubin: 0.5 mg/dL (ref 0.3–1.2)
Total Protein: 6.9 g/dL (ref 6.5–8.1)

## 2015-02-21 LAB — TSH: TSH: 0.873 u[IU]/mL (ref 0.350–4.500)

## 2015-02-21 LAB — GLUCOSE, CAPILLARY
GLUCOSE-CAPILLARY: 105 mg/dL — AB (ref 65–99)
Glucose-Capillary: 102 mg/dL — ABNORMAL HIGH (ref 65–99)
Glucose-Capillary: 117 mg/dL — ABNORMAL HIGH (ref 65–99)
Glucose-Capillary: 98 mg/dL (ref 65–99)

## 2015-02-21 LAB — LIPID PANEL
Cholesterol: 200 mg/dL (ref 0–200)
HDL: 22 mg/dL — ABNORMAL LOW (ref >40)
LDL Cholesterol: 111 mg/dL — ABNORMAL HIGH (ref 0–99)
Total CHOL/HDL Ratio: 9.1 RATIO
Triglycerides: 337 mg/dL — ABNORMAL HIGH (ref <150)
VLDL: 67 mg/dL — ABNORMAL HIGH (ref 0–40)

## 2015-02-21 MED ORDER — ADULT MULTIVITAMIN W/MINERALS CH
1.0000 | ORAL_TABLET | Freq: Every day | ORAL | Status: DC
  Administered 2015-02-21 – 2015-02-23 (×2): 1 via ORAL
  Filled 2015-02-21 (×3): qty 1

## 2015-02-21 MED ORDER — NITROGLYCERIN 0.4 MG SL SUBL: 0.4000 mg | SUBLINGUAL_TABLET | SUBLINGUAL | Status: DC | PRN

## 2015-02-21 MED ORDER — PANTOPRAZOLE SODIUM 40 MG PO TBEC
40.0000 mg | DELAYED_RELEASE_TABLET | Freq: Two times a day (BID) | ORAL | Status: DC
  Administered 2015-02-21 – 2015-02-23 (×5): 40 mg via ORAL
  Filled 2015-02-21 (×5): qty 1

## 2015-02-21 MED ORDER — ISOSORBIDE MONONITRATE ER 30 MG PO TB24
30.0000 mg | ORAL_TABLET | Freq: Every day | ORAL | Status: DC
  Administered 2015-02-21 – 2015-02-23 (×3): 30 mg via ORAL
  Filled 2015-02-21 (×3): qty 1

## 2015-02-21 MED ORDER — FOLIC ACID 1 MG PO TABS
1.0000 mg | ORAL_TABLET | Freq: Every day | ORAL | Status: DC
  Administered 2015-02-21 – 2015-02-23 (×2): 1 mg via ORAL
  Filled 2015-02-21 (×3): qty 1

## 2015-02-21 MED ORDER — VITAMIN B-1 100 MG PO TABS
100.0000 mg | ORAL_TABLET | Freq: Every day | ORAL | Status: DC
  Administered 2015-02-21 – 2015-02-23 (×2): 100 mg via ORAL
  Filled 2015-02-21 (×3): qty 1

## 2015-02-21 MED ORDER — TIOTROPIUM BROMIDE MONOHYDRATE 18 MCG IN CAPS
18.0000 ug | ORAL_CAPSULE | Freq: Every day | RESPIRATORY_TRACT | Status: DC | PRN
  Filled 2015-02-21: qty 5

## 2015-02-21 MED ORDER — METOPROLOL SUCCINATE ER 25 MG PO TB24
25.0000 mg | ORAL_TABLET | Freq: Every day | ORAL | Status: DC
  Administered 2015-02-21 – 2015-02-23 (×3): 25 mg via ORAL
  Filled 2015-02-21 (×3): qty 1

## 2015-02-21 MED ORDER — IPRATROPIUM-ALBUTEROL 0.5-2.5 (3) MG/3ML IN SOLN
3.0000 mL | RESPIRATORY_TRACT | Status: DC | PRN
  Administered 2015-02-22: 3 mL via RESPIRATORY_TRACT
  Filled 2015-02-21: qty 3

## 2015-02-21 MED ORDER — ACYCLOVIR 400 MG PO TABS
400.0000 mg | ORAL_TABLET | Freq: Every day | ORAL | Status: DC
  Administered 2015-02-21 – 2015-02-23 (×3): 400 mg via ORAL
  Filled 2015-02-21 (×3): qty 1

## 2015-02-21 MED ORDER — ACETAMINOPHEN 650 MG RE SUPP: 650.0000 mg | Freq: Four times a day (QID) | RECTAL | Status: DC | PRN

## 2015-02-21 MED ORDER — IPRATROPIUM-ALBUTEROL 18-103 MCG/ACT IN AERO: 1.0000 | INHALATION_SPRAY | RESPIRATORY_TRACT | Status: DC | PRN

## 2015-02-21 MED ORDER — OXYCODONE-ACETAMINOPHEN 5-325 MG PO TABS
1.0000 | ORAL_TABLET | ORAL | Status: DC | PRN
  Administered 2015-02-21 – 2015-02-23 (×8): 1 via ORAL
  Filled 2015-02-21 (×8): qty 1

## 2015-02-21 MED ORDER — EZETIMIBE 10 MG PO TABS
10.0000 mg | ORAL_TABLET | Freq: Every day | ORAL | Status: DC
  Administered 2015-02-21 – 2015-02-22 (×2): 10 mg via ORAL
  Filled 2015-02-21 (×2): qty 1

## 2015-02-21 MED ORDER — TRAMADOL HCL 50 MG PO TABS
ORAL_TABLET | ORAL | Status: AC
  Administered 2015-02-21: 02:00:00
  Filled 2015-02-21: qty 1

## 2015-02-21 MED ORDER — HYDRALAZINE HCL 20 MG/ML IJ SOLN
10.0000 mg | Freq: Once | INTRAMUSCULAR | Status: AC
Start: 2015-02-21 — End: 2015-02-21
  Administered 2015-02-21: 10 mg via INTRAVENOUS

## 2015-02-21 MED ORDER — OMEGA-3-ACID ETHYL ESTERS 1 G PO CAPS
1.0000 g | ORAL_CAPSULE | Freq: Two times a day (BID) | ORAL | Status: DC
  Administered 2015-02-21 – 2015-02-23 (×4): 1 g via ORAL
  Filled 2015-02-21 (×5): qty 1

## 2015-02-21 MED ORDER — SODIUM CHLORIDE 0.9 % IV SOLN
INTRAVENOUS | Status: DC
  Administered 2015-02-21: 21:00:00 via INTRAVENOUS

## 2015-02-21 MED ORDER — ACETAMINOPHEN 325 MG PO TABS: 650.0000 mg | ORAL_TABLET | Freq: Four times a day (QID) | ORAL | Status: DC | PRN

## 2015-02-21 MED ORDER — NITROGLYCERIN 2 % TD OINT
TOPICAL_OINTMENT | TRANSDERMAL | Status: AC
  Administered 2015-02-21: 02:00:00
  Filled 2015-02-21: qty 30

## 2015-02-21 MED ORDER — OXYCODONE-ACETAMINOPHEN 5-325 MG PO TABS
2.0000 | ORAL_TABLET | Freq: Once | ORAL | Status: AC
  Administered 2015-02-21: 2 via ORAL

## 2015-02-21 MED ORDER — HYDRALAZINE HCL 20 MG/ML IJ SOLN
INTRAMUSCULAR | Status: AC
  Administered 2015-02-21: 03:00:00
  Filled 2015-02-21: qty 1

## 2015-02-21 MED ORDER — ASPIRIN 325 MG PO TABS
325.0000 mg | ORAL_TABLET | Freq: Every day | ORAL | Status: DC
Start: 2015-02-21 — End: 2015-02-23
  Administered 2015-02-21 – 2015-02-23 (×3): 325 mg via ORAL
  Filled 2015-02-21 (×3): qty 1

## 2015-02-21 MED ORDER — VITAMIN D3 25 MCG (1000 UNIT) PO TABS
1000.0000 [IU] | ORAL_TABLET | Freq: Every morning | ORAL | Status: DC
  Administered 2015-02-21 – 2015-02-23 (×2): 1000 [IU] via ORAL
  Filled 2015-02-21 (×6): qty 1

## 2015-02-21 MED ORDER — TRAMADOL HCL 50 MG PO TABS
50.0000 mg | ORAL_TABLET | Freq: Four times a day (QID) | ORAL | Status: DC | PRN
  Administered 2015-02-21 – 2015-02-23 (×4): 50 mg via ORAL
  Filled 2015-02-21 (×3): qty 1

## 2015-02-21 MED ORDER — CETYLPYRIDINIUM CHLORIDE 0.05 % MT LIQD
7.0000 mL | Freq: Two times a day (BID) | OROMUCOSAL | Status: DC
  Administered 2015-02-21 (×2): 7 mL via OROMUCOSAL

## 2015-02-21 MED ORDER — POTASSIUM CHLORIDE CRYS ER 20 MEQ PO TBCR
40.0000 meq | EXTENDED_RELEASE_TABLET | Freq: Once | ORAL | Status: AC
  Administered 2015-02-21: 40 meq via ORAL
  Filled 2015-02-21: qty 2

## 2015-02-21 MED ORDER — ENSURE ENLIVE PO LIQD: 237.0000 mL | Freq: Three times a day (TID) | ORAL | Status: DC

## 2015-02-21 MED ORDER — CLONIDINE HCL 0.1 MG PO TABS
0.1000 mg | ORAL_TABLET | Freq: Once | ORAL | Status: AC
  Administered 2015-02-21: 0.1 mg via ORAL

## 2015-02-21 MED ORDER — CLONIDINE HCL 0.1 MG PO TABS
ORAL_TABLET | ORAL | Status: AC
  Administered 2015-02-21: 02:00:00
  Filled 2015-02-21: qty 1

## 2015-02-21 MED ORDER — ONDANSETRON HCL 4 MG/2ML IJ SOLN
4.0000 mg | Freq: Four times a day (QID) | INTRAMUSCULAR | Status: DC | PRN
  Administered 2015-02-22: 4 mg via INTRAVENOUS
  Filled 2015-02-21: qty 2

## 2015-02-21 MED ORDER — INSULIN ASPART 100 UNIT/ML ~~LOC~~ SOLN
0.0000 [IU] | Freq: Three times a day (TID) | SUBCUTANEOUS | Status: DC
  Administered 2015-02-22 – 2015-02-23 (×2): 2 [IU] via SUBCUTANEOUS

## 2015-02-21 MED ORDER — ONDANSETRON HCL 4 MG PO TABS: 4.0000 mg | ORAL_TABLET | Freq: Four times a day (QID) | ORAL | Status: DC | PRN

## 2015-02-21 MED ORDER — NITROGLYCERIN 2 % TD OINT
0.5000 [in_us] | TOPICAL_OINTMENT | Freq: Once | TRANSDERMAL | Status: AC
  Administered 2015-02-21: 0.5 [in_us] via TOPICAL

## 2015-02-21 NOTE — Care Management Obs Status (Signed)
[place] NOTIFICATION   Patient Details  Name: [name] MRN: [id number] Date of Birth: [birth year]   Medicare Observation Status Notification Given:  Yes    Lynnell Catalan, RN 02/21/2015, 2:05 PM

## 2015-02-21 NOTE — Progress Notes (Signed)
MD, Patient would like to be made a DNR.  She has already spoke with chaplain about this. Could we change her code status please?[name]

## 2015-02-21 NOTE — Progress Notes (Signed)
[facility]                                                                              Patient Demographics  [name], is a 81 y.o. female, DOB - 1931-04-19, HWK:[id number]  Admit date - 02/20/2015   Admitting Physician Allyne Gee, MD  Outpatient Primary MD for the patient is [name], MD  LOS -    Chief Complaint  Patient presents with  . Cancer  . Emesis  . Fever      HPI on 02/20/2015 by Dr. Devona Konig [name] is a 81 y.o. female with prior history of HTN HLD CAD Multiple myeloma Atrial Fibrillation GERD presents with nausea vomiting and diarrhea. She states that she started not feeling well over th last couple of weeks. She has had episodes of being sick on her stomach with vomiting and diarrhea. Patient has not had any fevers. She thinks she may have had a fever today but did not actually measure a temperature. Patient has associated no belly pain with the diarrhea. She has had no cramping. She has more back pain than anything else. Patient states that she does not recall eating anything that may have percipitated this. She states that she has not had much of an appetite. She has multiple myeloma and she has been getting chemoterapy every Friday she states. She has been on chemotherapy since July. Patient has not any issues with nausea or vomiting with getting the chemotherapy. Patient states that she has no blood in her stools. She states that she has blood in her vomit. She was on a blood thinner in June and she had noted blood in her stools at that time.   Assessment & Plan   Nausea, vomiting, diarrhea -Nausea and vomiting have subsided, patient has had no further episodes of diarrhea since admission -Possibly secondary to gastroenteritis -C. Difficile pending  -Currently no leukocytosis and patient is afebrile, no antibiotics started -Continue IVF and antiemetics  Hyperlipidemia  -Patient has statin allergy  -Lipid panel: Total cholesterol  200, triglycerides 337, HDL 22, LDL 111  -Continue omega-3 fish oil and Zetia   Hypokalemia -Likely secondary to GI losses, will replace and continue to monitor BMP  Hypertension -Continue metoprolol  GERD -Continue PPI  Coronary artery disease -Currently chest pain-free, continue Imdur, aspirin, metoprolol  COPD -Currently compensated, continue Spiriva and nebulizers as needed  Multiple myeloma/Bone pain -Received chemotherapy on Friday -Continue pain control  Chronic anemia -Possibly secondary to chemotherapy -Baseline hemoglobin 9-10, currently 9.7 -Continue to monitor CBC  Thrombocytopenia -Possibly due to chemotherapy, continue to monitor CBC -Use SCDs  Code Status: Full   Family Communication: None at bedside   Disposition Plan: Admitted for observation   Time Spent in minutes   30 minutes  Procedures  None  Consults   None  DVT Prophylaxis  SCDs  Lab Results  Component Value Date   PLT 116* 02/21/2015    Medications  Scheduled Meds: . acyclovir  400 mg Oral Daily  . antiseptic oral rinse  7 mL Mouth Rinse BID  . aspirin  325 mg Oral Daily  . cholecalciferol  1,000 Units Oral q morning - 10a  .  ezetimibe  10 mg Oral QHS  . feeding supplement (ENSURE ENLIVE)  237 mL Oral TID BM  . folic acid  1 mg Oral Daily  . insulin aspart  0-15 Units Subcutaneous TID WC  . isosorbide mononitrate  30 mg Oral Daily  . metoprolol succinate  25 mg Oral Daily  . multivitamin with minerals  1 tablet Oral Daily  . omega-3 acid ethyl esters  1 g Oral BID  . pantoprazole  40 mg Oral BID  . thiamine  100 mg Oral Daily   Continuous Infusions: . sodium chloride 50 mL/hr at 02/21/15 0305   PRN Meds:.acetaminophen **OR** acetaminophen, ipratropium-albuterol, nitroGLYCERIN, ondansetron **OR** ondansetron (ZOFRAN) IV, oxyCODONE-acetaminophen, tiotropium, traMADol  Antibiotics    Anti-infectives    Start     Dose/Rate Route Frequency Ordered Stop   02/21/15 1000   acyclovir (ZOVIRAX) tablet 400 mg     400 mg Oral Daily 02/21/15 0037        Subjective:   [name] seen and examined today. Patient denies any further nausea, vomiting, diarrhea. She denies any abdominal pain. Denies any shortness of breath, chest pain, dizziness or headache.   Objective:   Filed Vitals:   02/21/15 0200 02/21/15 0215 02/21/15 0300 02/21/15 0653  BP: 223/75 223/99 164/74 128/69  Pulse:   71   Temp:   97.9 F (36.6 C)   TempSrc:   Oral   Resp: $Remo'19 19 20   'xsbAF$ Height:   '5\' 2"'$  (1.575 m)   SpO2:   98%     Wt Readings from Last 3 Encounters:  02/09/15 59.421 kg (131 lb)  01/05/15 60.737 kg (133 lb 14.4 oz)  12/26/14 60.056 kg (132 lb 6.4 oz)     Intake/Output Summary (Last 24 hours) at 02/21/15 1039 Last data filed at 02/21/15 0818  Gross per 24 hour  Intake    360 ml  Output      0 ml  Net    360 ml    Exam  General: Well developed, well nourished, NAD, appears stated age  HEENT: NCAT, mucous membranes moist.   Cardiovascular: S1 S2 auscultated, no rubs, murmurs or gallops. Regular rate and rhythm.  Respiratory: Clear to auscultation bilaterally with equal chest rise  Abdomen: Soft, nontender, nondistended, + bowel sounds  Extremities: warm dry without cyanosis clubbing or edema  Neuro: AAOx3, nonfocal   Psych: Normal affect and demeanor with intact judgement and insight    Data Review   Micro Results No results found for this or any previous visit (from the past 240 hour(s)).  Radiology Reports Dg Chest 2 View  02/20/2015  CLINICAL DATA:  Subacute onset of nausea and vomiting. Acute onset of diarrhea and fever. Initial encounter. EXAM: CHEST  2 VIEW COMPARISON:  Chest radiograph from 12/26/2014 FINDINGS: The lungs are well-aerated and clear. There is no evidence of focal opacification, pleural effusion or pneumothorax. The heart is borderline normal in size. The patient is status post median sternotomy, with evidence of prior CABG. No  acute osseous abnormalities are seen. Clips are noted within the right upper quadrant, reflecting prior cholecystectomy. IMPRESSION: No acute cardiopulmonary process seen. Electronically Signed   By: Garald Balding M.D.   On: 02/20/2015 19:28   Dg Lumbar Spine 2-3 Views  01/25/2015  CLINICAL DATA:  New onset of low back and left hip pain, no injury EXAM: LUMBAR SPINE - 2-3 VIEW COMPARISON:  CT abdomen and pelvis of 05/20/2014 FINDINGS: There is little change in degenerative disc  disease at L5-S1 where there is loss of disc space and sclerosis with spurring. There does appear to be a mild 4 mm anterolisthesis of L4 on L5 most likely degenerative in origin. No compression deformity is seen. The SI joints are corticated. IMPRESSION: 1. Degenerative disc disease at L5-S1.  No acute abnormality. 2. 4 mm anterolisthesis of L4 on L5 most likely degenerative. Electronically Signed   By: Ivar Drape M.D.   On: 01/25/2015 16:53   Ct Abdomen Pelvis W Contrast  02/20/2015  CLINICAL DATA:  Acute onset of generalized abdominal pain, nausea, vomiting, diarrhea and fever. Patient on chemotherapy for multiple myeloma. Initial encounter. EXAM: CT ABDOMEN AND PELVIS WITH CONTRAST TECHNIQUE: Multidetector CT imaging of the abdomen and pelvis was performed using the standard protocol following bolus administration of intravenous contrast. CONTRAST:  153mL OMNIPAQUE IOHEXOL 300 MG/ML  SOLN COMPARISON:  CT of the abdomen and pelvis performed 05/20/2014 FINDINGS: The visualized lung bases are clear. The patient is status post median sternotomy. Diffuse coronary artery calcifications are seen. The liver and spleen are unremarkable in appearance. The patient is status post cholecystectomy, with clips noted at the gallbladder fossa. The pancreas and adrenal glands are unremarkable. A few small bilateral renal cysts are noted. The kidneys are otherwise unremarkable. There is no evidence of hydronephrosis. No renal or ureteral stones  are seen. No perinephric stranding is appreciated. No free fluid is identified. The small bowel is unremarkable in appearance. The stomach is within normal limits. No acute vascular abnormalities are seen. Diffuse calcification is noted along the abdominal aorta and its branches. Mild wall thickening is noted along the cecum and ascending colon, and there is question of mild wall thickening along the sigmoid colon with associated soft tissue inflammation, concerning for acute infectious or inflammatory colitis. The patient is status post appendectomy. The bladder is decompressed and not well assessed. The patient is status post hysterectomy. No suspicious adnexal masses are seen. No inguinal lymphadenopathy is seen. No acute osseous abnormalities are identified. There is minimal grade 1 anterolisthesis of L4 on L5, reflecting underlying facet disease. Intervertebral disc space narrowing is noted at L5-S1. IMPRESSION: 1. Mild wall thickening along the cecum and ascending colon, and question of mild wall thickening along the sigmoid colon, with associated soft tissue inflammation, concerning for acute infectious or inflammatory colitis. 2. Few small bilateral renal cysts noted. 3. Diffuse coronary artery calcifications seen. 4. Diffuse calcification along the abdominal aorta and its branches. Electronically Signed   By: Garald Balding M.D.   On: 02/20/2015 22:02   Dg Hip Unilat With Pelvis 2-3 Views Left  01/25/2015  CLINICAL DATA:  New onset of low back and left hip pain, no acute injury EXAM: DG HIP (WITH OR WITHOUT PELVIS) 2-3V LEFT COMPARISON:  None. FINDINGS: There is only mild degenerative joint disease of the left hip for age with slight loss of joint space and minimal spurring. No acute abnormality is seen. The left pelvic ramus is intact. The left SI joint is corticated. IMPRESSION: Only mild degenerative joint disease of the left hip for age. Electronically Signed   By: Ivar Drape M.D.   On: 01/25/2015  16:54    CBC  Recent Labs Lab 02/16/15 1510 02/20/15 1829 02/21/15 0600  WBC 4.9 4.1 5.4  HGB 11.7 11.7* 9.7*  HCT 35.9 36.2 29.2*  PLT 173 142* 116*  MCV 97.8 96.3 94.2  MCH 31.9 31.1 31.3  MCHC 32.6 32.3 33.2  RDW 15.6* 15.2 15.3  LYMPHSABS  1.3 1.9  --   MONOABS 0.1 0.5  --   EOSABS 0.0 0.0  --   BASOSABS 0.0 0.0  --     Chemistries   Recent Labs Lab 02/16/15 1510 02/20/15 1829 02/21/15 0600  NA 137 138 135  K 4.0 3.6 3.1*  CL  --  103 105  CO2 $Re'27 27 26  'wKw$ GLUCOSE 147* 104* 107*  BUN 23.$Remov'9 18 13  'BfWvHA$ CREATININE 1.2* 0.92 0.82  CALCIUM 10.0 9.4 8.4*  AST $Re'18 27 20  'TtI$ ALT 10 16 13*  ALKPHOS 61 61 51  BILITOT 0.40 0.7 0.5   ------------------------------------------------------------------------------------------------------------------ estimated creatinine clearance is 42.6 mL/min (by C-G formula based on Cr of 0.82). ------------------------------------------------------------------------------------------------------------------ No results for input(s): HGBA1C in the last 72 hours. ------------------------------------------------------------------------------------------------------------------  Recent Labs  02/21/15 0600  CHOL 200  HDL 22*  LDLCALC 111*  TRIG 337*  CHOLHDL 9.1   ------------------------------------------------------------------------------------------------------------------  Recent Labs  02/21/15 0600  TSH 0.873   ------------------------------------------------------------------------------------------------------------------ No results for input(s): VITAMINB12, FOLATE, FERRITIN, TIBC, IRON, RETICCTPCT in the last 72 hours.  Coagulation profile No results for input(s): INR, PROTIME in the last 168 hours.  No results for input(s): DDIMER in the last 72 hours.  Cardiac Enzymes No results for input(s): CKMB, TROPONINI, MYOGLOBIN in the last 168 hours.  Invalid input(s):  CK ------------------------------------------------------------------------------------------------------------------ Invalid input(s): POCBNP    Linton Stolp D.O. on 02/21/2015 at 10:39 AM  Between 7am to 7pm - Pager - [phone]  After 7pm go to [email or web address] - password [email or web address]  And look for the night coverage person covering for me after hours  [facility] Office  [phone]

## 2015-02-21 NOTE — Progress Notes (Signed)
   02/21/15 1200  Clinical Encounter Type  Visited With Patient  Visit Type Initial;Psychological support;Spiritual support;Other (Comment) (Requested Help With DNR)  Referral From Physician  Consult/Referral To [place] (Comment) (Pastoral Conversation)  Stress Factors  Patient Stress Factors None identified   The Chaplain was visited the patient per referral from the night physician. The patient was laying in bed upon the Chaplain's arrival with the lights low. She stated that she had been expecting the Chaplain to come and talk with her. The patient stated that the nighttime physician told her that the Chaplains can help her complete the DNR paperwork. The Chaplain explained to the patient that the Chaplains cannot make her a DNR, but that the physician would have to do that. The Chaplain told the patient that she would talk with the physician and let he/she know that the patient wants to talk with them about DNR.  The patient was satisfied with this.  The Chaplain followed up with the unit secretary due to the patient's nurse being on the phone. The unit secretary stated that she would let the nurse know the outcome of the visit.  The Chaplain will follow-up with the patient to make sure her needs were met.

## 2015-02-22 ENCOUNTER — Other Ambulatory Visit: Payer: Self-pay | Admitting: *Deleted

## 2015-02-22 DIAGNOSIS — R197 Diarrhea, unspecified: Secondary | ICD-10-CM | POA: Diagnosis not present

## 2015-02-22 DIAGNOSIS — J42 Unspecified chronic bronchitis: Secondary | ICD-10-CM | POA: Diagnosis not present

## 2015-02-22 DIAGNOSIS — K529 Noninfective gastroenteritis and colitis, unspecified: Secondary | ICD-10-CM

## 2015-02-22 DIAGNOSIS — R112 Nausea with vomiting, unspecified: Secondary | ICD-10-CM | POA: Diagnosis not present

## 2015-02-22 DIAGNOSIS — E785 Hyperlipidemia, unspecified: Secondary | ICD-10-CM | POA: Diagnosis not present

## 2015-02-22 LAB — GLUCOSE, CAPILLARY
GLUCOSE-CAPILLARY: 138 mg/dL — AB (ref 65–99)
GLUCOSE-CAPILLARY: 110 mg/dL — AB (ref 65–99)
GLUCOSE-CAPILLARY: 89 mg/dL (ref 65–99)
Glucose-Capillary: 93 mg/dL (ref 65–99)

## 2015-02-22 LAB — CBC
HCT: 29.1 % — ABNORMAL LOW (ref 36.0–46.0)
HEMOGLOBIN: 9.3 g/dL — AB (ref 12.0–15.0)
MCH: 31.2 pg (ref 26.0–34.0)
MCHC: 32 g/dL (ref 30.0–36.0)
MCV: 97.7 fL (ref 78.0–100.0)
Platelets: 117 10*3/uL — ABNORMAL LOW (ref 150–400)
RBC: 2.98 MIL/uL — AB (ref 3.87–5.11)
RDW: 15.7 % — ABNORMAL HIGH (ref 11.5–15.5)
WBC: 5.1 10*3/uL (ref 4.0–10.5)

## 2015-02-22 LAB — BASIC METABOLIC PANEL
Anion gap: 3 — ABNORMAL LOW (ref 5–15)
BUN: 16 mg/dL (ref 6–20)
CHLORIDE: 109 mmol/L (ref 101–111)
CO2: 25 mmol/L (ref 22–32)
CREATININE: 1.1 mg/dL — AB (ref 0.44–1.00)
Calcium: 8.5 mg/dL — ABNORMAL LOW (ref 8.9–10.3)
GFR calc non Af Amer: 46 mL/min — ABNORMAL LOW (ref >60)
GFR, EST AFRICAN AMERICAN: 53 mL/min — AB (ref >60)
Glucose, Bld: 97 mg/dL (ref 65–99)
POTASSIUM: 4.2 mmol/L (ref 3.5–5.1)
SODIUM: 137 mmol/L (ref 135–145)

## 2015-02-22 LAB — HEMOGLOBIN A1C
HEMOGLOBIN A1C: 5.7 % — AB (ref 4.8–5.6)
MEAN PLASMA GLUCOSE: 117 mg/dL

## 2015-02-22 MED ORDER — HYDRALAZINE HCL 20 MG/ML IJ SOLN
10.0000 mg | Freq: Four times a day (QID) | INTRAMUSCULAR | Status: DC | PRN
  Administered 2015-02-23: 10 mg via INTRAVENOUS
  Filled 2015-02-22: qty 1

## 2015-02-22 MED ORDER — PROMETHAZINE HCL 25 MG PO TABS
25.0000 mg | ORAL_TABLET | Freq: Once | ORAL | Status: AC
  Administered 2015-02-22: 25 mg via ORAL
  Filled 2015-02-22: qty 1

## 2015-02-22 MED ORDER — HYDRALAZINE HCL 20 MG/ML IJ SOLN
10.0000 mg | Freq: Once | INTRAMUSCULAR | Status: AC
  Administered 2015-02-22: 10 mg via INTRAVENOUS
  Filled 2015-02-22: qty 1

## 2015-02-22 MED ORDER — OXYCODONE-ACETAMINOPHEN 5-325 MG PO TABS: 1.0000 | ORAL_TABLET | Freq: Four times a day (QID) | ORAL | Status: DC | PRN

## 2015-02-22 NOTE — Discharge Instructions (Signed)

## 2015-02-22 NOTE — Discharge Summary (Addendum)
Physician Discharge Summary  [name]:[id number] DOB: [birth year] DOA: 02/20/2015  PCP: [name], MD  Admit date: 02/20/2015 Discharge date: 02/22/2015  Time spent: 45 minutes  Recommendations for Outpatient Follow-up:  Patient will be discharged to home.  Patient will need to follow up with primary care provider within one week of discharge and repeat CBC and BMP.  Patient should continue medications as prescribed.  Patient should follow a heart healthy diet.   Discharge Diagnoses:  Principal Problem:   Nausea vomiting and diarrhea Active Problems:   Dyslipidemia   GERD (gastroesophageal reflux disease)   COPD (chronic obstructive pulmonary disease) ([place])   Hypertension   Diarrhea   CKD stage 3  Discharge Condition: Stable  Diet recommendation: Heart healthy  Filed Weights   02/21/15 2223  Weight: 60.056 kg (132 lb 6.4 oz)    History of present illness:  on 02/20/2015 by Dr. Devona Konig Lannette [name] is a 81 y.o. female with prior history of HTN HLD CAD Multiple myeloma Atrial Fibrillation GERD presents with nausea vomiting and diarrhea. She states that she started not feeling well over th last couple of weeks. She has had episodes of being sick on her stomach with vomiting and diarrhea. Patient has not had any fevers. She thinks she may have had a fever today but did not actually measure a temperature. Patient has associated no belly pain with the diarrhea. She has had no cramping. She has more back pain than anything else. Patient states that she does not recall eating anything that may have percipitated this. She states that she has not had much of an appetite. She has multiple myeloma and she has been getting chemoterapy every Friday she states. She has been on chemotherapy since July. Patient has not any issues with nausea or vomiting with getting the chemotherapy. Patient states that she has no blood in her stools. She states that she has blood in her  vomit. She was on a blood thinner in June and she had noted blood in her stools at that time.   Hospital Course:  Nausea, vomiting, diarrhea -Nausea and vomiting have subsided, patient has had no further episodes of diarrhea since admission -Possibly secondary to gastroenteritis -C. Difficile Ag +, toxin negative, likely colonization -Currently no leukocytosis and patient is afebrile, no antibiotics started  Hyperlipidemia  -Patient has statin allergy  -Lipid panel: Total cholesterol 200, triglycerides 337, HDL 22, LDL 111  -Continue omega-3 fish oil and Zetia   Hypokalemia -Likely secondary to GI losses -Resolved, K 4.2  Hypertension -Continue metoprolol  GERD -Continue PPI  Coronary artery disease -Currently chest pain-free, continue Imdur, aspirin, metoprolol  COPD -Currently compensated, continue Spiriva and nebulizers as needed  Multiple myeloma/Bone pain -Receives chemotherapy on Friday -Continue pain control  Chronic anemia -Possibly secondary to chemotherapy -Baseline hemoglobin 9-10, currently 9.3  Thrombocytopenia -Possibly due to chemotherapy, [place] [address]  Chronic kidney disease, stage 3 -Stable  Code status discussion -Patient has decided to be DNR  Procedures  None  Consults  None  Discharge Exam: Filed Vitals:   02/22/15 0738  BP: 150/62  Pulse:   Temp:   Resp:    Exam  General: Well developed, well nourished, NAD  HEENT: NCAT, mucous membranes moist.   Cardiovascular: S1 S2 auscultated, RRR  Respiratory: Clear to auscultation  Abdomen: Soft, nontender, nondistended, + bowel sounds  Extremities: warm dry without cyanosis clubbing or edema  Neuro: AAOx3, nonfocal  Psych: Normal affect and demeanor  Discharge Instructions  Discharge Instructions    Discharge instructions    Complete by:  As directed   Patient will be discharged to home.  Patient will need to follow up with primary care provider within one week  of discharge and repeat CBC and BMP.  Patient should continue medications as prescribed.  Patient should follow a heart healthy diet.            Medication List    TAKE these medications        acetaminophen 325 MG tablet  Commonly known as:  TYLENOL  Take 2 tablets (650 mg total) by mouth every 6 (six) hours as needed for mild pain (or Fever >/= 101).     acyclovir 400 MG tablet  Commonly known as:  ZOVIRAX  Take 1 tablet (400 mg total) by mouth daily.     albuterol-ipratropium 18-103 MCG/ACT inhaler  Commonly known as:  COMBIVENT  Inhale 1-2 puffs into the lungs every 4 (four) hours as needed for wheezing or shortness of breath.     aspirin 325 MG tablet  Take 325 mg by mouth daily.     cholecalciferol 1000 UNITS tablet  Commonly known as:  VITAMIN D  Take 1,000 Units by mouth every morning.     dexamethasone 4 MG tablet  Commonly known as:  DECADRON  Take 5 tablets every week before chemotherapy injection.     ezetimibe 10 MG tablet  Commonly known as:  ZETIA  Take 1 tablet (10 mg total) by mouth at bedtime.     feeding supplement (ENSURE ENLIVE) Liqd  Take 237 mLs by mouth 3 (three) times daily between meals.     Fish Oil 1200 MG Caps  Take 1,200-2,400 mg by mouth 2 (two) times daily. Take 2 capsules (2400 mg) every morning and 1 capsule (1200 mg) every night     isosorbide mononitrate 30 MG 24 hr tablet  Commonly known as:  IMDUR  Take 1 tablet (30 mg total) by mouth daily.     metoprolol succinate 25 MG 24 hr tablet  Commonly known as:  TOPROL-XL  Take 1 tablet (25 mg total) by mouth daily.     nitroGLYCERIN 0.4 MG SL tablet  Commonly known as:  NITROSTAT  Place 0.4 mg under the tongue every 5 (five) minutes as needed for chest pain.     oxyCODONE-acetaminophen 5-325 MG tablet  Commonly known as:  PERCOCET/ROXICET  Take 1-2 tablets by mouth every 6 (six) hours as needed for moderate pain or severe pain.     pantoprazole 40 MG tablet  Commonly known  as:  PROTONIX  Take 1 tablet (40 mg total) by mouth 2 (two) times daily.     polyethylene glycol powder powder  Commonly known as:  GLYCOLAX/MIRALAX  Take 17 g by mouth every morning. Mix with 8 oz liquid and drink     PRESCRIPTION MEDICATION  Chemo - [facility]     prochlorperazine 10 MG tablet  Commonly known as:  COMPAZINE  Take 10 mg by mouth every 8 (eight) hours as needed for nausea.     tiotropium 18 MCG inhalation capsule  Commonly known as:  SPIRIVA  Place 18 mcg into inhaler and inhale daily as needed (shortness of breath).     traMADol 50 MG tablet  Commonly known as:  ULTRAM  Take 50 mg by mouth every 6 (six) hours as needed for moderate pain.       Allergies  Allergen Reactions  . Lexapro [Escitalopram Oxalate]  Other (See Comments)    Fatigue   . Soma [Carisoprodol] Other (See Comments)    Fatigue   . Wellbutrin [Bupropion] Other (See Comments)    "couldn't function and take it"  . Albuterol Sulfate Other (See Comments)    Albuterol HFA inhaler caused nervousness   . Codeine Hives  . Levaquin [Levofloxacin In D5w] Other (See Comments)    Unknown allergic reaction  . Plavix [Clopidogrel Bisulfate] Other (See Comments)    Unknown reaction  . Statins     Unknown reaction    Follow-up Information    Follow up with [name], MD. Schedule an appointment as soon as possible for a visit in 1 week.   Specialty:  Internal Medicine   Why:  Hospital follow-up, repeat BMP and CBC   Contact information:   [address] [place] [address] [phone]        The results of significant diagnostics from this hospitalization (including imaging, microbiology, ancillary and laboratory) are listed below for reference.    Significant Diagnostic Studies: Dg Chest 2 View  02/20/2015  CLINICAL DATA:  Subacute onset of nausea and vomiting. Acute onset of diarrhea and fever. Initial encounter. EXAM: CHEST  2 VIEW COMPARISON:  Chest radiograph from  12/26/2014 FINDINGS: The lungs are well-aerated and clear. There is no evidence of focal opacification, pleural effusion or pneumothorax. The heart is borderline normal in size. The patient is status post median sternotomy, with evidence of prior CABG. No acute osseous abnormalities are seen. Clips are noted within the right upper quadrant, reflecting prior cholecystectomy. IMPRESSION: No acute cardiopulmonary process seen. Electronically Signed   By: Garald Balding M.D.   On: 02/20/2015 19:28   Dg Lumbar Spine 2-3 Views  01/25/2015  CLINICAL DATA:  New onset of low back and left hip pain, no injury EXAM: LUMBAR SPINE - 2-3 VIEW COMPARISON:  CT abdomen and pelvis of 05/20/2014 FINDINGS: There is little change in degenerative disc disease at L5-S1 where there is loss of disc space and sclerosis with spurring. There does appear to be a mild 4 mm anterolisthesis of L4 on L5 most likely degenerative in origin. No compression deformity is seen. The SI joints are corticated. IMPRESSION: 1. Degenerative disc disease at L5-S1.  No acute abnormality. 2. 4 mm anterolisthesis of L4 on L5 most likely degenerative. Electronically Signed   By: Ivar Drape M.D.   On: 01/25/2015 16:53   Ct Abdomen Pelvis W Contrast  02/20/2015  CLINICAL DATA:  Acute onset of generalized abdominal pain, nausea, vomiting, diarrhea and fever. Patient on chemotherapy for multiple myeloma. Initial encounter. EXAM: CT ABDOMEN AND PELVIS WITH CONTRAST TECHNIQUE: Multidetector CT imaging of the abdomen and pelvis was performed using the standard protocol following bolus administration of intravenous contrast. CONTRAST:  131m OMNIPAQUE IOHEXOL 300 MG/ML  SOLN COMPARISON:  CT of the abdomen and pelvis performed 05/20/2014 FINDINGS: The visualized lung bases are clear. The patient is status post median sternotomy. Diffuse coronary artery calcifications are seen. The liver and spleen are unremarkable in appearance. The patient is status post  cholecystectomy, with clips noted at the gallbladder fossa. The pancreas and adrenal glands are unremarkable. A few small bilateral renal cysts are noted. The kidneys are otherwise unremarkable. There is no evidence of hydronephrosis. No renal or ureteral stones are seen. No perinephric stranding is appreciated. No free fluid is identified. The small bowel is unremarkable in appearance. The stomach is within normal limits. No acute vascular abnormalities are seen.  Diffuse calcification is noted along the abdominal aorta and its branches. Mild wall thickening is noted along the cecum and ascending colon, and there is question of mild wall thickening along the sigmoid colon with associated soft tissue inflammation, concerning for acute infectious or inflammatory colitis. The patient is status post appendectomy. The bladder is decompressed and not well assessed. The patient is status post hysterectomy. No suspicious adnexal masses are seen. No inguinal lymphadenopathy is seen. No acute osseous abnormalities are identified. There is minimal grade 1 anterolisthesis of L4 on L5, reflecting underlying facet disease. Intervertebral disc space narrowing is noted at L5-S1. IMPRESSION: 1. Mild wall thickening along the cecum and ascending colon, and question of mild wall thickening along the sigmoid colon, with associated soft tissue inflammation, concerning for acute infectious or inflammatory colitis. 2. Few small bilateral renal cysts noted. 3. Diffuse coronary artery calcifications seen. 4. Diffuse calcification along the abdominal aorta and its branches. Electronically Signed   By: Garald Balding M.D.   On: 02/20/2015 22:02   Dg Hip Unilat With Pelvis 2-3 Views Left  01/25/2015  CLINICAL DATA:  New onset of low back and left hip pain, no acute injury EXAM: DG HIP (WITH OR WITHOUT PELVIS) 2-3V LEFT COMPARISON:  None. FINDINGS: There is only mild degenerative joint disease of the left hip for age with slight loss of  joint space and minimal spurring. No acute abnormality is seen. The left pelvic ramus is intact. The left SI joint is corticated. IMPRESSION: Only mild degenerative joint disease of the left hip for age. Electronically Signed   By: Ivar Drape M.D.   On: 01/25/2015 16:54    Microbiology: Recent Results (from the past 240 hour(s))  Urine culture     Status: None (Preliminary result)   Collection Time: 02/20/15  9:34 PM  Result Value Ref Range Status   Specimen Description URINE, CLEAN CATCH  Final   Special Requests NONE  Final   Culture   Final    >=100,000 COLONIES/mL GRAM NEGATIVE RODS CULTURE REINCUBATED FOR BETTER GROWTH Performed at [facility]    Report Status PENDING  Incomplete  C difficile quick scan w PCR reflex     Status: Abnormal   Collection Time: 02/21/15 12:36 PM  Result Value Ref Range Status   C Diff antigen POSITIVE (A) NEGATIVE Final   C Diff toxin NEGATIVE NEGATIVE Final   C Diff interpretation   Final    C. difficile present, but toxin not detected. This indicates colonization. In most cases, this does not require treatment. If patient has signs and symptoms consistent with colitis, consider treatment. Requires ENTERIC precautions.     Labs: Basic Metabolic Panel:  Recent Labs Lab 02/16/15 1510 02/20/15 1829 02/21/15 0600 02/22/15 0400  NA 137 138 135 137  K 4.0 3.6 3.1* 4.2  CL  --  103 105 109  CO2 _0 GLUCOSE 147* 104* 107* 97  BUN 23._1 CREATININE 1.2* 0.92 0.82 1.10*  CALCIUM 10.0 9.4 8.4* 8.5*   Liver Function Tests:  Recent Labs Lab 02/16/15 1510 02/20/15 1829 02/21/15 0600  AST _2 ALT 10 16 13*  ALKPHOS 61 61 51  BILITOT 0.40 0.7 0.5  PROT 9.0* 8.4* 6.9  ALBUMIN 3.8 3.8 3.2*    Recent Labs Lab 02/20/15 1829  LIPASE 26   No results for input(s): AMMONIA in the last 168 hours. CBC:  Recent Labs Lab 02/16/15 1510 02/20/15 1829  02/21/15 0600 02/22/15 0400  WBC 4.9 4.1 5.4 5.1  NEUTROABS  3.6 1.7  --   --   HGB 11.7 11.7* 9.7* 9.3*  HCT 35.9 36.2 29.2* 29.1*  MCV 97.8 96.3 94.2 97.7  PLT 173 142* 116* 117*   Cardiac Enzymes: No results for input(s): CKTOTAL, CKMB, CKMBINDEX, TROPONINI in the last 168 hours. BNP: BNP (last 3 results)  Recent Labs  05/23/14 1445 08/13/14 1216 10/09/14 1524  BNP 537.1* 150.0* 537.4*    ProBNP (last 3 results) No results for input(s): PROBNP in the last 8760 hours.  CBG:  Recent Labs Lab 02/21/15 0758 02/21/15 1237 02/21/15 1726 02/21/15 2238 02/22/15 0739  GLUCAP 102* 117* 105* 98 93       Signed:  Mykenna Viele  [facility] 02/22/2015, 9:45 AM

## 2015-02-22 NOTE — Progress Notes (Signed)
   02/22/15 1100  Clinical Encounter Type  Visited With Patient  Visit Type Follow-up;Psychological support;Spiritual support  Spiritual Encounters  Spiritual Needs Emotional;Other (Comment) Academic librarian)  Stress Factors  Patient Stress Factors Health changes;Other (Comment) (Feeling sick)   The Chaplain followed up with the patient via phone call to the patient's room. The patient stated that she was sick to her stomach today and "not doing well at all." The phone call was brief due to the patient's nausea.  The Chaplain wanted to follow-up with the patient from a previous visit the day before where the patient asked the Chaplain about making her a DNR.  Pt. Stated that nobody had come in to talk to her about making her a DNR that she knew of yesterday, but that she couldn't recall because she slept a lot.  The Chaplain noted from the chart status that she has been made a DNR. If someone from the medical team could come in and speak with her about this that would be helpful. The Chaplain will follow-up with medical staff and the patient at a later time.  [employer]

## 2015-02-23 ENCOUNTER — Other Ambulatory Visit: Payer: Medicare Other

## 2015-02-23 ENCOUNTER — Ambulatory Visit: Payer: Medicare Other

## 2015-02-23 LAB — URINE CULTURE: Culture: >=100000

## 2015-02-23 LAB — URINALYSIS, ROUTINE W REFLEX MICROSCOPIC
Bilirubin Urine: NEGATIVE
GLUCOSE, UA: NEGATIVE mg/dL
Hgb urine dipstick: NEGATIVE
Ketones, ur: NEGATIVE mg/dL
LEUKOCYTES UA: NEGATIVE
Nitrite: NEGATIVE
PH: 5.5 (ref 5.0–8.0)
Protein, ur: 30 mg/dL — AB
Specific Gravity, Urine: 1.013 (ref 1.005–1.030)

## 2015-02-23 LAB — GLUCOSE, CAPILLARY
GLUCOSE-CAPILLARY: 128 mg/dL — AB (ref 65–99)
GLUCOSE-CAPILLARY: 111 mg/dL — AB (ref 65–99)

## 2015-02-23 LAB — URINE MICROSCOPIC-ADD ON: RBC / HPF: NONE SEEN RBC/hpf (ref 0–5)

## 2015-02-23 MED ORDER — PROCHLORPERAZINE MALEATE 10 MG PO TABS: 10.0000 mg | ORAL_TABLET | Freq: Three times a day (TID) | ORAL | Status: DC | PRN

## 2015-02-23 NOTE — Progress Notes (Signed)
Please see full discharge summary done on 02/22/2015. Patient's discharge was held as she became nauseous and wanted to be observed for an additional evening. Patient does have some nausea which she states is constant. She was able to tolerate her diet this morning with no vomiting. Patient has been taking Compazine at home. Will refill that medication for her. She also complained of some urinary frequency. Obtain repeat UA which was negative for leukocytes and nitrites, showed few bacteria.    Patient is stable for discharge today. No changes were made to medication list.  Time spent: 30 minutes  Nyara Capell D.O. [facility] Pager [phone]  If 7PM-7AM, please contact night-coverage [email or web address] Password [email or web address] 02/23/2015, 12:44 PM '

## 2015-03-02 ENCOUNTER — Ambulatory Visit (HOSPITAL_BASED_OUTPATIENT_CLINIC_OR_DEPARTMENT_OTHER): Payer: Medicare Other

## 2015-03-02 ENCOUNTER — Other Ambulatory Visit (HOSPITAL_BASED_OUTPATIENT_CLINIC_OR_DEPARTMENT_OTHER): Payer: Medicare Other

## 2015-03-02 VITALS — BP 154/71 | HR 60 | Temp 98.0°F | Resp 18

## 2015-03-02 DIAGNOSIS — Z5112 Encounter for antineoplastic immunotherapy: Secondary | ICD-10-CM

## 2015-03-02 DIAGNOSIS — C9000 Multiple myeloma not having achieved remission: Secondary | ICD-10-CM

## 2015-03-02 DIAGNOSIS — C9002 Multiple myeloma in relapse: Secondary | ICD-10-CM

## 2015-03-02 LAB — CBC WITH DIFFERENTIAL/PLATELET
BASO%: 0.2 % (ref 0.0–2.0)
BASOS ABS: 0 10*3/uL (ref 0.0–0.1)
EOS ABS: 0 10*3/uL (ref 0.0–0.5)
EOS%: 0.2 % (ref 0.0–7.0)
HEMATOCRIT: 33.2 % — AB (ref 34.8–46.6)
HGB: 10.8 g/dL — ABNORMAL LOW (ref 11.6–15.9)
LYMPH#: 1 10*3/uL (ref 0.9–3.3)
LYMPH%: 19.9 % (ref 14.0–49.7)
MCH: 30.9 pg (ref 25.1–34.0)
MCHC: 32.5 g/dL (ref 31.5–36.0)
MCV: 95.1 fL (ref 79.5–101.0)
MONO#: 0.1 10*3/uL (ref 0.1–0.9)
MONO%: 1.6 % (ref 0.0–14.0)
NEUT#: 3.8 10*3/uL (ref 1.5–6.5)
NEUT%: 78.1 % — AB (ref 38.4–76.8)
PLATELETS: 179 10*3/uL (ref 145–400)
RBC: 3.49 10*6/uL — AB (ref 3.70–5.45)
RDW: 16.7 % — ABNORMAL HIGH (ref 11.2–14.5)
WBC: 4.8 10*3/uL (ref 3.9–10.3)

## 2015-03-02 MED ORDER — ONDANSETRON HCL 8 MG PO TABS
ORAL_TABLET | ORAL | Status: AC
  Filled 2015-03-02: qty 1

## 2015-03-02 MED ORDER — BORTEZOMIB CHEMO SQ INJECTION 3.5 MG (2.5MG/ML)
1.3000 mg/m2 | Freq: Once | INTRAMUSCULAR | Status: AC
  Administered 2015-03-02: 2 mg via SUBCUTANEOUS
  Filled 2015-03-02: qty 2

## 2015-03-02 MED ORDER — ONDANSETRON HCL 8 MG PO TABS
8.0000 mg | ORAL_TABLET | Freq: Once | ORAL | Status: AC
  Administered 2015-03-02: 8 mg via ORAL

## 2015-03-02 NOTE — Patient Instructions (Signed)
[facility] Discharge Instructions for Patients Receiving Chemotherapy  Today you received the following chemotherapy agents Velcade. To help prevent nausea and vomiting after your treatment, we encourage you to take your nausea medication as directed.  If you develop nausea and vomiting that is not controlled by your nausea medication, call the clinic.   BELOW ARE SYMPTOMS THAT SHOULD BE REPORTED IMMEDIATELY:  *FEVER GREATER THAN 100.5 F  *CHILLS WITH OR WITHOUT FEVER  NAUSEA AND VOMITING THAT IS NOT CONTROLLED WITH YOUR NAUSEA MEDICATION  *UNUSUAL SHORTNESS OF BREATH  *UNUSUAL BRUISING OR BLEEDING  TENDERNESS IN MOUTH AND THROAT WITH OR WITHOUT PRESENCE OF ULCERS  *URINARY PROBLEMS  *BOWEL PROBLEMS  UNUSUAL RASH Items with * indicate a potential emergency and should be followed up as soon as possible.  Feel free to call the clinic you have any questions or concerns. The clinic phone number is [phone].  Please show the CHEMO ALERT CARD at check-in to the Emergency Department and triage nurse.    

## 2015-03-02 NOTE — Progress Notes (Signed)
Ok to treat without a CMP drawn today per Dr. Alen Blew.

## 2015-03-09 ENCOUNTER — Other Ambulatory Visit: Payer: Self-pay | Admitting: *Deleted

## 2015-03-09 ENCOUNTER — Other Ambulatory Visit: Payer: Self-pay | Admitting: Oncology

## 2015-03-09 ENCOUNTER — Ambulatory Visit (HOSPITAL_BASED_OUTPATIENT_CLINIC_OR_DEPARTMENT_OTHER): Payer: Medicare Other

## 2015-03-09 ENCOUNTER — Other Ambulatory Visit (HOSPITAL_BASED_OUTPATIENT_CLINIC_OR_DEPARTMENT_OTHER): Payer: Medicare Other

## 2015-03-09 VITALS — BP 107/55 | HR 74 | Temp 97.8°F | Resp 18

## 2015-03-09 DIAGNOSIS — C9000 Multiple myeloma not having achieved remission: Secondary | ICD-10-CM

## 2015-03-09 DIAGNOSIS — Z5112 Encounter for antineoplastic immunotherapy: Secondary | ICD-10-CM | POA: Diagnosis not present

## 2015-03-09 DIAGNOSIS — C9002 Multiple myeloma in relapse: Secondary | ICD-10-CM

## 2015-03-09 LAB — COMPREHENSIVE METABOLIC PANEL
ALT: 13 U/L (ref 0–55)
AST: 23 U/L (ref 5–34)
Albumin: 3.8 g/dL (ref 3.5–5.0)
Alkaline Phosphatase: 61 U/L (ref 40–150)
Anion Gap: 9 mEq/L (ref 3–11)
BUN: 22.7 mg/dL (ref 7.0–26.0)
CO2: 25 mEq/L (ref 22–29)
Calcium: 9.6 mg/dL (ref 8.4–10.4)
Chloride: 105 mEq/L (ref 98–109)
Creatinine: 1.4 mg/dL — ABNORMAL HIGH (ref 0.6–1.1)
EGFR: 36 mL/min/{1.73_m2} — ABNORMAL LOW (ref >90)
Glucose: 157 mg/dl — ABNORMAL HIGH (ref 70–140)
Potassium: 3.8 mEq/L (ref 3.5–5.1)
Sodium: 139 mEq/L (ref 136–145)
Total Bilirubin: 0.43 mg/dL (ref 0.20–1.20)
Total Protein: 9 g/dL — ABNORMAL HIGH (ref 6.4–8.3)

## 2015-03-09 LAB — CBC WITH DIFFERENTIAL/PLATELET
BASO%: 0.5 % (ref 0.0–2.0)
BASOS ABS: 0 10*3/uL (ref 0.0–0.1)
EOS ABS: 0 10*3/uL (ref 0.0–0.5)
EOS%: 0.3 % (ref 0.0–7.0)
HCT: 34 % — ABNORMAL LOW (ref 34.8–46.6)
HEMOGLOBIN: 11 g/dL — AB (ref 11.6–15.9)
LYMPH%: 25.6 % (ref 14.0–49.7)
MCH: 30.7 pg (ref 25.1–34.0)
MCHC: 32.4 g/dL (ref 31.5–36.0)
MCV: 94.6 fL (ref 79.5–101.0)
MONO#: 0.1 10*3/uL (ref 0.1–0.9)
MONO%: 2.6 % (ref 0.0–14.0)
NEUT#: 3 10*3/uL (ref 1.5–6.5)
NEUT%: 71 % (ref 38.4–76.8)
PLATELETS: 216 10*3/uL (ref 145–400)
RBC: 3.6 10*6/uL — ABNORMAL LOW (ref 3.70–5.45)
RDW: 16.7 % — AB (ref 11.2–14.5)
WBC: 4.3 10*3/uL (ref 3.9–10.3)
lymph#: 1.1 10*3/uL (ref 0.9–3.3)

## 2015-03-09 MED ORDER — BORTEZOMIB CHEMO SQ INJECTION 3.5 MG (2.5MG/ML)
1.3000 mg/m2 | Freq: Once | INTRAMUSCULAR | Status: AC
  Administered 2015-03-09: 2 mg via SUBCUTANEOUS
  Filled 2015-03-09: qty 2

## 2015-03-09 MED ORDER — ONDANSETRON HCL 8 MG PO TABS
8.0000 mg | ORAL_TABLET | Freq: Once | ORAL | Status: AC
  Administered 2015-03-09: 8 mg via ORAL

## 2015-03-09 MED ORDER — OXYCODONE-ACETAMINOPHEN 5-325 MG PO TABS: 1.0000 | ORAL_TABLET | Freq: Four times a day (QID) | ORAL | Status: DC | PRN

## 2015-03-09 MED ORDER — OXYCODONE-ACETAMINOPHEN 5-325 MG PO TABS
ORAL_TABLET | ORAL | Status: AC
  Filled 2015-03-09: qty 1

## 2015-03-09 MED ORDER — HYDROMORPHONE HCL 4 MG PO TABS: 4.0000 mg | ORAL_TABLET | ORAL | Status: DC | PRN

## 2015-03-09 MED ORDER — ONDANSETRON HCL 8 MG PO TABS
ORAL_TABLET | ORAL | Status: AC
  Filled 2015-03-09: qty 1

## 2015-03-09 MED ORDER — OXYCODONE-ACETAMINOPHEN 5-325 MG PO TABS: 1.0000 | ORAL_TABLET | Freq: Once | ORAL | Status: DC

## 2015-03-09 NOTE — Patient Instructions (Signed)
[facility] Discharge Instructions for Patients Receiving Chemotherapy  Today you received the following chemotherapy agents Velcade. To help prevent nausea and vomiting after your treatment, we encourage you to take your nausea medication as directed.  If you develop nausea and vomiting that is not controlled by your nausea medication, call the clinic.   BELOW ARE SYMPTOMS THAT SHOULD BE REPORTED IMMEDIATELY:  *FEVER GREATER THAN 100.5 F  *CHILLS WITH OR WITHOUT FEVER  NAUSEA AND VOMITING THAT IS NOT CONTROLLED WITH YOUR NAUSEA MEDICATION  *UNUSUAL SHORTNESS OF BREATH  *UNUSUAL BRUISING OR BLEEDING  TENDERNESS IN MOUTH AND THROAT WITH OR WITHOUT PRESENCE OF ULCERS  *URINARY PROBLEMS  *BOWEL PROBLEMS  UNUSUAL RASH Items with * indicate a potential emergency and should be followed up as soon as possible.  Feel free to call the clinic you have any questions or concerns. The clinic phone number is [phone].  Please show the CHEMO ALERT CARD at check-in to the Emergency Department and triage nurse.    

## 2015-03-09 NOTE — Progress Notes (Signed)
Pt reports pain in back and right hip. Dr. Alen Blew aware and orders for one percocet 5/325 mg po and home prescription given. Pt refused percocet and prescription returned to Dr. Alen Blew desk nurse. Dr. Alen Blew aware. Dilaudid prescription written by Dr. Alen Blew, given to patient. Pt stable at time of discharge.

## 2015-03-12 ENCOUNTER — Emergency Department (HOSPITAL_COMMUNITY): Payer: Medicare Other

## 2015-03-12 ENCOUNTER — Other Ambulatory Visit: Payer: Self-pay

## 2015-03-12 ENCOUNTER — Encounter (HOSPITAL_COMMUNITY): Payer: Self-pay | Admitting: Emergency Medicine

## 2015-03-12 ENCOUNTER — Observation Stay (HOSPITAL_COMMUNITY)
Admission: EM | Admit: 2015-03-12 | Discharge: 2015-03-17 | Disposition: A | Discharge status: Home / Self Care | Payer: Medicare Other | Attending: Internal Medicine | Admitting: Internal Medicine

## 2015-03-12 DIAGNOSIS — R112 Nausea with vomiting, unspecified: Secondary | ICD-10-CM | POA: Diagnosis present

## 2015-03-12 DIAGNOSIS — Z7982 Long term (current) use of aspirin: Secondary | ICD-10-CM | POA: Insufficient documentation

## 2015-03-12 DIAGNOSIS — Z79899 Other long term (current) drug therapy: Secondary | ICD-10-CM | POA: Diagnosis not present

## 2015-03-12 DIAGNOSIS — G2581 Restless legs syndrome: Secondary | ICD-10-CM | POA: Insufficient documentation

## 2015-03-12 DIAGNOSIS — Z955 Presence of coronary angioplasty implant and graft: Secondary | ICD-10-CM | POA: Diagnosis not present

## 2015-03-12 DIAGNOSIS — I252 Old myocardial infarction: Secondary | ICD-10-CM | POA: Diagnosis not present

## 2015-03-12 DIAGNOSIS — Z90710 Acquired absence of both cervix and uterus: Secondary | ICD-10-CM | POA: Diagnosis not present

## 2015-03-12 DIAGNOSIS — D701 Agranulocytosis secondary to cancer chemotherapy: Secondary | ICD-10-CM | POA: Diagnosis not present

## 2015-03-12 DIAGNOSIS — Z9049 Acquired absence of other specified parts of digestive tract: Secondary | ICD-10-CM | POA: Diagnosis not present

## 2015-03-12 DIAGNOSIS — C9002 Multiple myeloma in relapse: Secondary | ICD-10-CM | POA: Diagnosis not present

## 2015-03-12 DIAGNOSIS — Z6823 Body mass index (BMI) 23.0-23.9, adult: Secondary | ICD-10-CM | POA: Diagnosis not present

## 2015-03-12 DIAGNOSIS — K5732 Diverticulitis of large intestine without perforation or abscess without bleeding: Secondary | ICD-10-CM | POA: Insufficient documentation

## 2015-03-12 DIAGNOSIS — E785 Hyperlipidemia, unspecified: Secondary | ICD-10-CM | POA: Diagnosis not present

## 2015-03-12 DIAGNOSIS — D630 Anemia in neoplastic disease: Secondary | ICD-10-CM | POA: Diagnosis not present

## 2015-03-12 DIAGNOSIS — I160 Hypertensive urgency: Secondary | ICD-10-CM | POA: Diagnosis not present

## 2015-03-12 DIAGNOSIS — Z87891 Personal history of nicotine dependence: Secondary | ICD-10-CM | POA: Insufficient documentation

## 2015-03-12 DIAGNOSIS — I25810 Atherosclerosis of coronary artery bypass graft(s) without angina pectoris: Secondary | ICD-10-CM | POA: Diagnosis present

## 2015-03-12 DIAGNOSIS — C9000 Multiple myeloma not having achieved remission: Secondary | ICD-10-CM | POA: Diagnosis present

## 2015-03-12 DIAGNOSIS — Z951 Presence of aortocoronary bypass graft: Secondary | ICD-10-CM | POA: Insufficient documentation

## 2015-03-12 DIAGNOSIS — K219 Gastro-esophageal reflux disease without esophagitis: Secondary | ICD-10-CM | POA: Diagnosis not present

## 2015-03-12 DIAGNOSIS — I4891 Unspecified atrial fibrillation: Secondary | ICD-10-CM | POA: Diagnosis not present

## 2015-03-12 DIAGNOSIS — E876 Hypokalemia: Secondary | ICD-10-CM | POA: Insufficient documentation

## 2015-03-12 DIAGNOSIS — M109 Gout, unspecified: Secondary | ICD-10-CM | POA: Diagnosis not present

## 2015-03-12 DIAGNOSIS — T451X5A Adverse effect of antineoplastic and immunosuppressive drugs, initial encounter: Secondary | ICD-10-CM | POA: Diagnosis not present

## 2015-03-12 DIAGNOSIS — J449 Chronic obstructive pulmonary disease, unspecified: Secondary | ICD-10-CM | POA: Diagnosis not present

## 2015-03-12 DIAGNOSIS — I10 Essential (primary) hypertension: Secondary | ICD-10-CM

## 2015-03-12 DIAGNOSIS — E43 Unspecified severe protein-calorie malnutrition: Secondary | ICD-10-CM | POA: Diagnosis not present

## 2015-03-12 DIAGNOSIS — R1110 Vomiting, unspecified: Secondary | ICD-10-CM | POA: Diagnosis present

## 2015-03-12 DIAGNOSIS — K5792 Diverticulitis of intestine, part unspecified, without perforation or abscess without bleeding: Secondary | ICD-10-CM | POA: Diagnosis present

## 2015-03-12 DIAGNOSIS — E440 Moderate protein-calorie malnutrition: Secondary | ICD-10-CM | POA: Diagnosis present

## 2015-03-12 LAB — URINE MICROSCOPIC-ADD ON

## 2015-03-12 LAB — COMPREHENSIVE METABOLIC PANEL
ALBUMIN: 3.5 g/dL (ref 3.5–5.0)
ALK PHOS: 55 U/L (ref 38–126)
ALT: 14 U/L (ref 14–54)
AST: 23 U/L (ref 15–41)
Anion gap: 8 (ref 5–15)
BILIRUBIN TOTAL: 0.4 mg/dL (ref 0.3–1.2)
BUN: 28 mg/dL — ABNORMAL HIGH (ref 6–20)
CO2: 27 mmol/L (ref 22–32)
Calcium: 9.3 mg/dL (ref 8.9–10.3)
Chloride: 104 mmol/L (ref 101–111)
Creatinine, Ser: 1.04 mg/dL — ABNORMAL HIGH (ref 0.44–1.00)
GFR calc Af Amer: 57 mL/min — ABNORMAL LOW (ref >60)
GFR calc non Af Amer: 49 mL/min — ABNORMAL LOW (ref >60)
GLUCOSE: 106 mg/dL — AB (ref 65–99)
POTASSIUM: 3.6 mmol/L (ref 3.5–5.1)
Sodium: 139 mmol/L (ref 135–145)
TOTAL PROTEIN: 8.3 g/dL — AB (ref 6.5–8.1)

## 2015-03-12 LAB — I-STAT CG4 LACTIC ACID, ED: LACTIC ACID, VENOUS: 0.7 mmol/L (ref 0.5–2.0)

## 2015-03-12 LAB — URINALYSIS, ROUTINE W REFLEX MICROSCOPIC
Glucose, UA: NEGATIVE mg/dL
Hgb urine dipstick: NEGATIVE
KETONES UR: NEGATIVE mg/dL
LEUKOCYTES UA: NEGATIVE
NITRITE: NEGATIVE
PH: 6 (ref 5.0–8.0)
PROTEIN: 100 mg/dL — AB
Specific Gravity, Urine: 1.023 (ref 1.005–1.030)

## 2015-03-12 LAB — DIFFERENTIAL
BASOS ABS: 0 10*3/uL (ref 0.0–0.1)
BASOS PCT: 0 %
EOS ABS: 0 10*3/uL (ref 0.0–0.7)
Eosinophils Relative: 1 %
Lymphocytes Relative: 39 %
Lymphs Abs: 1.2 10*3/uL (ref 0.7–4.0)
MONOS PCT: 13 %
Monocytes Absolute: 0.4 10*3/uL (ref 0.1–1.0)
NEUTROS ABS: 1.4 10*3/uL — AB (ref 1.7–7.7)
NEUTROS PCT: 47 %

## 2015-03-12 LAB — CBC
HEMATOCRIT: 32.9 % — AB (ref 36.0–46.0)
HEMOGLOBIN: 10.4 g/dL — AB (ref 12.0–15.0)
MCH: 30.2 pg (ref 26.0–34.0)
MCHC: 31.6 g/dL (ref 30.0–36.0)
MCV: 95.6 fL (ref 78.0–100.0)
Platelets: 132 10*3/uL — ABNORMAL LOW (ref 150–400)
RBC: 3.44 MIL/uL — AB (ref 3.87–5.11)
RDW: 15.6 % — ABNORMAL HIGH (ref 11.5–15.5)
WBC: 3.2 10*3/uL — ABNORMAL LOW (ref 4.0–10.5)

## 2015-03-12 LAB — I-STAT TROPONIN, ED: TROPONIN I, POC: 0.03 ng/mL (ref 0.00–0.08)

## 2015-03-12 LAB — LIPASE, BLOOD: Lipase: 26 U/L (ref 11–51)

## 2015-03-12 MED ORDER — DEXTROSE-NACL 5-0.9 % IV SOLN
INTRAVENOUS | Status: DC
  Administered 2015-03-13 – 2015-03-15 (×5): via INTRAVENOUS

## 2015-03-12 MED ORDER — DEXTROSE 5 % IV SOLN: 2.0000 g | Freq: Every day | INTRAVENOUS | Status: DC

## 2015-03-12 MED ORDER — ENOXAPARIN SODIUM 40 MG/0.4ML ~~LOC~~ SOLN
40.0000 mg | SUBCUTANEOUS | Status: DC
  Administered 2015-03-13 – 2015-03-17 (×4): 40 mg via SUBCUTANEOUS
  Filled 2015-03-12 (×5): qty 0.4

## 2015-03-12 MED ORDER — SODIUM CHLORIDE 0.9 % IV BOLUS (SEPSIS)
1000.0000 mL | Freq: Once | INTRAVENOUS | Status: AC
  Administered 2015-03-12: 1000 mL via INTRAVENOUS

## 2015-03-12 MED ORDER — PIPERACILLIN-TAZOBACTAM 3.375 G IVPB
3.3750 g | Freq: Three times a day (TID) | INTRAVENOUS | Status: DC
  Administered 2015-03-12 – 2015-03-15 (×8): 3.375 g via INTRAVENOUS
  Filled 2015-03-12 (×6): qty 50

## 2015-03-12 MED ORDER — IOHEXOL 300 MG/ML  SOLN
75.0000 mL | Freq: Once | INTRAMUSCULAR | Status: AC | PRN
  Administered 2015-03-12: 75 mL via INTRAVENOUS

## 2015-03-12 MED ORDER — ONDANSETRON HCL 4 MG/2ML IJ SOLN
4.0000 mg | Freq: Four times a day (QID) | INTRAMUSCULAR | Status: DC | PRN
  Administered 2015-03-13 – 2015-03-16 (×5): 4 mg via INTRAVENOUS
  Filled 2015-03-12 (×5): qty 2

## 2015-03-12 MED ORDER — EZETIMIBE 10 MG PO TABS
10.0000 mg | ORAL_TABLET | Freq: Every day | ORAL | Status: DC
  Administered 2015-03-13 – 2015-03-16 (×5): 10 mg via ORAL
  Filled 2015-03-12 (×5): qty 1

## 2015-03-12 MED ORDER — PANTOPRAZOLE SODIUM 40 MG IV SOLR
40.0000 mg | INTRAVENOUS | Status: DC
  Administered 2015-03-12 – 2015-03-16 (×5): 40 mg via INTRAVENOUS
  Filled 2015-03-12 (×5): qty 40

## 2015-03-12 MED ORDER — METRONIDAZOLE IN NACL 5-0.79 MG/ML-% IV SOLN: 500.0000 mg | Freq: Three times a day (TID) | INTRAVENOUS | Status: DC

## 2015-03-12 MED ORDER — TIOTROPIUM BROMIDE MONOHYDRATE 18 MCG IN CAPS: 18.0000 ug | ORAL_CAPSULE | Freq: Every day | RESPIRATORY_TRACT | Status: DC | PRN

## 2015-03-12 MED ORDER — ONDANSETRON HCL 4 MG/2ML IJ SOLN
4.0000 mg | Freq: Once | INTRAMUSCULAR | Status: AC
  Administered 2015-03-12: 4 mg via INTRAVENOUS
  Filled 2015-03-12: qty 2

## 2015-03-12 MED ORDER — NITROGLYCERIN 0.4 MG SL SUBL: 0.4000 mg | SUBLINGUAL_TABLET | SUBLINGUAL | Status: DC | PRN

## 2015-03-12 MED ORDER — METRONIDAZOLE IN NACL 5-0.79 MG/ML-% IV SOLN
500.0000 mg | Freq: Once | INTRAVENOUS | Status: DC
  Administered 2015-03-12: 500 mg via INTRAVENOUS
  Filled 2015-03-12: qty 100

## 2015-03-12 MED ORDER — ASPIRIN 325 MG PO TABS
325.0000 mg | ORAL_TABLET | Freq: Every day | ORAL | Status: DC
  Administered 2015-03-13 – 2015-03-17 (×4): 325 mg via ORAL
  Filled 2015-03-12 (×5): qty 1

## 2015-03-12 MED ORDER — IPRATROPIUM-ALBUTEROL 18-103 MCG/ACT IN AERO: 1.0000 | INHALATION_SPRAY | RESPIRATORY_TRACT | Status: DC | PRN

## 2015-03-12 MED ORDER — IOHEXOL 300 MG/ML  SOLN: 25.0000 mL | Freq: Once | INTRAMUSCULAR | Status: DC | PRN

## 2015-03-12 MED ORDER — ISOSORBIDE MONONITRATE ER 30 MG PO TB24
30.0000 mg | ORAL_TABLET | Freq: Every day | ORAL | Status: DC
  Administered 2015-03-13 – 2015-03-17 (×4): 30 mg via ORAL
  Filled 2015-03-12 (×5): qty 1

## 2015-03-12 NOTE — Progress Notes (Signed)
ANTIBIOTIC CONSULT NOTE - INITIAL  Pharmacy Consult for Zosyn Indication: Intra-abdominal infection  Allergies  Allergen Reactions  . Lexapro [Escitalopram Oxalate] Other (See Comments)    Fatigue   . Soma [Carisoprodol] Other (See Comments)    Fatigue   . Wellbutrin [Bupropion] Other (See Comments)    "couldn't function and take it"  . Albuterol Sulfate Other (See Comments)    Albuterol HFA inhaler caused nervousness   . Codeine Hives  . Levaquin [Levofloxacin In D5w] Other (See Comments)    Unknown allergic reaction  . Plavix [Clopidogrel Bisulfate] Other (See Comments)    Unknown reaction  . Statins     Unknown reaction     Patient Measurements: Height: 5\' 2"  (157.5 cm) Weight: 130 lb (58.968 kg) IBW/kg (Calculated) : 50.1   Vital Signs: Temp: 97.7 F (36.5 C) (12/05 1751) Temp Source: Oral (12/05 1751) BP: 207/81 mmHg (12/05 2254) Pulse Rate: 56 (12/05 2254) Intake/Output from previous day:   Intake/Output from this shift:    Labs:  Recent Labs  03/12/15 1815  WBC 3.2*  HGB 10.4*  PLT 132*  CREATININE 1.04*   Estimated Creatinine Clearance: 33.6 mL/min (by C-G formula based on Cr of 1.04). No results for input(s): VANCOTROUGH, VANCOPEAK, VANCORANDOM, GENTTROUGH, GENTPEAK, GENTRANDOM, TOBRATROUGH, TOBRAPEAK, TOBRARND, AMIKACINPEAK, AMIKACINTROU, AMIKACIN in the last 72 hours.   Microbiology: Recent Results (from the past 720 hour(s))  Urine culture     Status: None   Collection Time: 02/20/15  9:34 PM  Result Value Ref Range Status   Specimen Description URINE, CLEAN CATCH  Final   Special Requests NONE  Final   Culture   Final    >=100,000 COLONIES/mL ENTEROBACTER AEROGENES Performed at [facility]    Report Status 02/23/2015 FINAL  Final   Organism ID, Bacteria ENTEROBACTER AEROGENES  Final      Susceptibility   Enterobacter aerogenes - MIC*    CEFAZOLIN >=64 RESISTANT Resistant     CEFTRIAXONE <=1 SENSITIVE Sensitive      CIPROFLOXACIN <=0.25 SENSITIVE Sensitive     GENTAMICIN <=1 SENSITIVE Sensitive     IMIPENEM 1 SENSITIVE Sensitive     NITROFURANTOIN 64 INTERMEDIATE Intermediate     TRIMETH/SULFA <=20 SENSITIVE Sensitive     PIP/TAZO <=4 SENSITIVE Sensitive     * >=100,000 COLONIES/mL ENTEROBACTER AEROGENES  C difficile quick scan w PCR reflex     Status: Abnormal   Collection Time: 02/21/15 12:36 PM  Result Value Ref Range Status   C Diff antigen POSITIVE (A) NEGATIVE Final   C Diff toxin NEGATIVE NEGATIVE Final   C Diff interpretation   Final    C. difficile present, but toxin not detected. This indicates colonization. In most cases, this does not require treatment. If patient has signs and symptoms consistent with colitis, consider treatment. Requires ENTERIC precautions.    Medical History: Past Medical History  Diagnosis Date  . COPD (chronic obstructive pulmonary disease) ([place])   . Gout   . Coronary atherosclerosis of native coronary artery 02/05/2012  . Hypertension   . High blood cholesterol   . Heart murmur     "all my life" (02/05/2012)  . Anginal pain ([place])   . Myocardial infarction ([place]) 1990's; 2000's    "2 total, I think" (02/05/2012)  . Pneumonia     "several times" (02/05/2012)  . Chronic bronchitis ([place])   . Shortness of breath     "sometimes when I'm laying down; always when I do too much" (02/05/2012)  .  History of blood transfusion   . External bleeding hemorrhoids     "act up at times" (02/05/2012)  . Chronic back pain     "all over" (02/05/2012)  . Depression   . CAD (coronary artery disease) of artery bypass graft 02/09/14    atretic [place]  . Multiple myeloma ([place]) 09/07/2014  . A-fib ([place]) 10/09/2014    Medications:   (Not in a hospital admission) Scheduled:  . [START ON 03/13/2015] aspirin  325 mg Oral Daily  . enoxaparin (LOVENOX) injection  40 mg Subcutaneous Q24H  . ezetimibe  10 mg Oral QHS  . [START ON 03/13/2015] isosorbide mononitrate  30 mg Oral Daily   . pantoprazole (PROTONIX) IV  40 mg Intravenous Q24H   Infusions:  . dextrose 5 % and 0.9% NaCl    . piperacillin-tazobactam (ZOSYN)  IV     Assessment: 61 yoF with hx of multiple myeloma on chemo presents with n/v, abdominal pain on palpation.  CT shows diverticultis without perforation or abscess. Zosyn per Rx.   Goal of Therapy:  Treat IAI  Plan:   Zosyn 3.375 Gm IV q8h EI  F/u SCr/cultures/levels   Lawana Pai R 03/12/2015,11:33 PM

## 2015-03-12 NOTE — ED Notes (Signed)
Report attempted to 5E. RN unavailable at this time.

## 2015-03-12 NOTE — ED Notes (Signed)
Hamad, MD, notified that patient received Flagyl prior to discontinuation

## 2015-03-12 NOTE — ED Provider Notes (Signed)
CSN: [id number]     Arrival date & time 03/12/15  1746 History   First MD Initiated Contact with Patient 03/12/15 1824     Chief Complaint  Patient presents with  . Chemotherapy  . Nausea  . Emesis     (Consider location/radiation/quality/duration/timing/severity/associated sxs/prior Treatment) HPI Comments: [name] is a 81 y.o. female with a PMHx of multiple myeloma on chemo, COPD, CAD/MI, HTN, HLD, Afib, chronic back pain, hemorrhoids, and other medical issues listed below, who presents to the ED with complaints of nausea and vomiting since receiving chemotherapy on Friday. Patient states that over the last several months every time she gets chemotherapy she has had progressively worsening nausea and vomiting. The last 24 hours she reports 5 episodes of nonbloody nonbilious emesis unrelieved by her home Compazine. No known aggravating factors aside from receiving chemotherapy. She denies any fevers, chills, chest pain, shortness breath, abdominal pain, diarrhea, constipation, melena, hematochezia, obstipation, hematemesis, dysuria, hematuria, numbness, tingling, weakness, recent sick contacts, recent travel, suspicious food intake, alcohol use, NSAID use, or recent antibiotics. Per chart review, she was admitted 1 month ago for colitis/gastroenteritis, never received antibiotics.   Patient is a 81 y.o. female presenting with vomiting. The history is provided by the patient. No language interpreter was used.  Emesis Severity:  Moderate Duration:  3 days Timing:  Constant Number of daily episodes:  5-6x in last 24hrs Quality:  Stomach contents Progression:  Unchanged Chronicity:  Recurrent Recent urination:  Normal Context comment:  Chemotherapy on Friday Relieved by:  Nothing Worsened by:  Nothing tried Ineffective treatments:  Antiemetics Associated symptoms: no abdominal pain, no arthralgias, no chills, no diarrhea and no myalgias   Risk factors: no alcohol use, no sick  contacts, no suspect food intake and no travel to endemic areas     Past Medical History  Diagnosis Date  . COPD (chronic obstructive pulmonary disease) ([place])   . Gout   . Coronary atherosclerosis of native coronary artery 02/05/2012  . Hypertension   . High blood cholesterol   . Heart murmur     "all my life" (02/05/2012)  . Anginal pain ([place])   . Myocardial infarction ([place]) 1990's; 2000's    "2 total, I think" (02/05/2012)  . Pneumonia     "several times" (02/05/2012)  . Chronic bronchitis ([place])   . Shortness of breath     "sometimes when I'm laying down; always when I do too much" (02/05/2012)  . History of blood transfusion   . External bleeding hemorrhoids     "act up at times" (02/05/2012)  . Chronic back pain     "all over" (02/05/2012)  . Depression   . CAD (coronary artery disease) of artery bypass graft 02/09/14    atretic LIMA  . Multiple myeloma ([place]) 09/07/2014  . A-fib ([place]) 10/09/2014   Past Surgical History  Procedure Laterality Date  . Appendectomy  ? 1970's  . Ectopic pregnancy surgery  1970's  . Abdominal hysterectomy  ? 1970's    "partial 1st time; complete 2nd" (02/05/2012)  . Cholecystectomy  ~ 2010  . Cataract extraction w/ intraocular lens  implant, bilateral  [phone]  . Coronary angioplasty with stent placement  2005  . Coronary artery bypass graft  2005    CABG X2  . Cardiac catheterization  02/09/14    atretic LIMA,  patent VG-dRCA and Total occlusion of the native RCA proximally prior to remotely placement RCA stent, with mild proximal 30% LAD stenosis and 30% distal  circumflex stenoses.  EF 55%.  . Left heart catheterization with coronary/graft angiogram N/A 02/09/2014    Procedure: LEFT HEART CATHETERIZATION WITH Beatrix Fetters;  Surgeon: Troy Sine, MD;  Location: [facility] CATH LAB;  Service: Cardiovascular;  Laterality: N/A;  . Esophagogastroduodenoscopy (egd) with propofol Left 05/22/2014    Procedure: ESOPHAGOGASTRODUODENOSCOPY  (EGD) WITH PROPOFOL;  Surgeon: Arta Silence, MD;  Location: [facility] ENDOSCOPY;  Service: Endoscopy;  Laterality: Left;   Family History  Problem Relation Age of Onset  . Anemia Neg Hx   . Arrhythmia Neg Hx   . Asthma Neg Hx   . Clotting disorder Neg Hx   . Fainting Neg Hx   . Heart attack Neg Hx   . Heart disease Neg Hx   . Heart failure Neg Hx   . Hyperlipidemia Neg Hx   . Hypertension Neg Hx   . Aneurysm    . CVA    . CVA Mother   . Aneurysm Father    Social History  Substance Use Topics  . Smoking status: Former Smoker -- 1.00 packs/day for 50 years    Types: Cigarettes    Quit date: 04/02/2011  . Smokeless tobacco: Never Used  . Alcohol Use: Yes     Comment: says she drinks wine once in a while   OB History    No data available     Review of Systems  Constitutional: Negative for fever and chills.  Respiratory: Negative for shortness of breath.   Cardiovascular: Negative for chest pain.  Gastrointestinal: Positive for nausea and vomiting. Negative for abdominal pain, diarrhea, constipation and blood in stool.  Genitourinary: Negative for dysuria and hematuria.  Musculoskeletal: Negative for myalgias and arthralgias.  Skin: Negative for color change.  Allergic/Immunologic: Positive for immunocompromised state (on chemo).  Neurological: Negative for weakness and numbness.  Psychiatric/Behavioral: Negative for confusion.   10 Systems reviewed and are negative for acute change except as noted in the HPI.    Allergies  Lexapro; Soma; Wellbutrin; Albuterol sulfate; Codeine; Levaquin; Plavix; and Statins  Home Medications   Prior to Admission medications   Medication Sig Start Date End Date Taking? Authorizing Provider  acetaminophen (TYLENOL) 325 MG tablet Take 2 tablets (650 mg total) by mouth every 6 (six) hours as needed for mild pain (or Fever >/= 101). 09/13/14   Reyne Dumas, MD  acyclovir (ZOVIRAX) 400 MG tablet Take 1 tablet (400 mg total) by mouth daily.  10/27/14   Carlton Adam, PA-C  albuterol-ipratropium (COMBIVENT) 18-103 MCG/ACT inhaler Inhale 1-2 puffs into the lungs every 4 (four) hours as needed for wheezing or shortness of breath.    Historical Provider, MD  aspirin 325 MG tablet Take 325 mg by mouth daily.    Historical Provider, MD  cholecalciferol (VITAMIN D) 1000 UNITS tablet Take 1,000 Units by mouth every morning.     Historical Provider, MD  dexamethasone (DECADRON) 4 MG tablet Take 5 tablets every week before chemotherapy injection. 10/30/14   Carlton Adam, PA-C  ezetimibe (ZETIA) 10 MG tablet Take 1 tablet (10 mg total) by mouth at bedtime. 05/25/14   Hosie Poisson, MD  feeding supplement, ENSURE ENLIVE, (ENSURE ENLIVE) LIQD Take 237 mLs by mouth 3 (three) times daily between meals. 09/13/14   Reyne Dumas, MD  HYDROmorphone (DILAUDID) 4 MG tablet Take 1 tablet (4 mg total) by mouth every 4 (four) hours as needed for severe pain. 03/09/15   Wyatt Portela, MD  isosorbide mononitrate (IMDUR) 30 MG 24 hr  tablet Take 1 tablet (30 mg total) by mouth daily. 12/26/14   Belva Crome, MD  metoprolol succinate (TOPROL-XL) 25 MG 24 hr tablet Take 1 tablet (25 mg total) by mouth daily. 12/26/14   Belva Crome, MD  nitroGLYCERIN (NITROSTAT) 0.4 MG SL tablet Place 0.4 mg under the tongue every 5 (five) minutes as needed for chest pain.    Historical Provider, MD  Omega-3 Fatty Acids (FISH OIL) 1200 MG CAPS Take 1,200-2,400 mg by mouth 2 (two) times daily. Take 2 capsules (2400 mg) every morning and 1 capsule (1200 mg) every night    Historical Provider, MD  oxyCODONE-acetaminophen (PERCOCET/ROXICET) 5-325 MG tablet Take 1-2 tablets by mouth every 6 (six) hours as needed for moderate pain or severe pain. 03/09/15   Wyatt Portela, MD  pantoprazole (PROTONIX) 40 MG tablet Take 1 tablet (40 mg total) by mouth 2 (two) times daily. 05/25/14   Hosie Poisson, MD  polyethylene glycol powder (GLYCOLAX/MIRALAX) powder Take 17 g by mouth every morning. Mix with  8 oz liquid and drink 05/16/14   Historical Provider, MD  [place]    Historical Provider, MD  prochlorperazine (COMPAZINE) 10 MG tablet Take 1 tablet (10 mg total) by mouth every 8 (eight) hours as needed for nausea. 02/23/15   Maryann Mikhail, DO  tiotropium (SPIRIVA) 18 MCG inhalation capsule Place 18 mcg into inhaler and inhale daily as needed (shortness of breath).     Historical Provider, MD  traMADol (ULTRAM) 50 MG tablet Take 50 mg by mouth every 6 (six) hours as needed for moderate pain.  01/17/15   Historical Provider, MD   BP 147/108 mmHg  Pulse 96  Temp(Src) 97.7 F (36.5 C) (Oral)  SpO2 94% Physical Exam  Constitutional: She is oriented to person, place, and time. Vital signs are normal. She appears well-developed and well-nourished.  Non-toxic appearance. No distress.  Afebrile, nontoxic, NAD. Thin and pale appearing  HENT:  Head: Normocephalic and atraumatic.  Mouth/Throat: Oropharynx is clear and moist. Mucous membranes are dry.  Mildly dry mucous membranes  Eyes: Conjunctivae and EOM are normal. Right eye exhibits no discharge. Left eye exhibits no discharge.  Neck: Normal range of motion. Neck supple.  Cardiovascular: Normal rate, regular rhythm, normal heart sounds and intact distal pulses.  Exam reveals no gallop and no friction rub.   No murmur heard. Pulmonary/Chest: Effort normal and breath sounds normal. No respiratory distress. She has no decreased breath sounds. She has no wheezes. She has no rhonchi. She has no rales.  Abdominal: Soft. Normal appearance and bowel sounds are normal. She exhibits no distension. There is tenderness in the right lower quadrant, suprapubic area and left lower quadrant. There is no rigidity, no rebound, no guarding, no CVA tenderness, no tenderness at McBurney's point and negative Murphy's sign.    Soft, nondistended, +BS throughout, with moderate LLQ/suprapubic/RLQ TTP but more notable in LLQ, no r/g/r, neg  murphy's, neg mcburney's, no CVA TTP   Musculoskeletal: Normal range of motion.  Neurological: She is alert and oriented to person, place, and time. She has normal strength. No sensory deficit.  Skin: Skin is warm, dry and intact. No rash noted.  Psychiatric: She has a normal mood and affect.  Nursing note and vitals reviewed.   ED Course  Procedures (including critical care time) Labs Review Labs Reviewed  COMPREHENSIVE METABOLIC PANEL - Abnormal; Notable for the following:    Glucose, Bld 106 (*)    BUN 28 (*)  Creatinine, Ser 1.04 (*)    Total Protein 8.3 (*)    GFR calc non Af Amer 49 (*)    GFR calc Af Amer 57 (*)    All other components within normal limits  CBC - Abnormal; Notable for the following:    WBC 3.2 (*)    RBC 3.44 (*)    Hemoglobin 10.4 (*)    HCT 32.9 (*)    RDW 15.6 (*)    Platelets 132 (*)    All other components within normal limits  URINALYSIS, ROUTINE W REFLEX MICROSCOPIC (NOT AT [facility]) - Abnormal; Notable for the following:    APPearance CLOUDY (*)    Bilirubin Urine MODERATE (*)    Protein, ur 100 (*)    All other components within normal limits  DIFFERENTIAL - Abnormal; Notable for the following:    Neutro Abs 1.4 (*)    All other components within normal limits  URINE MICROSCOPIC-ADD ON - Abnormal; Notable for the following:    Squamous Epithelial / LPF 6-30 (*)    Bacteria, UA FEW (*)    All other components within normal limits  LIPASE, BLOOD  I-STAT TROPOININ, ED  I-STAT CG4 LACTIC ACID, ED    Imaging Review Ct Abdomen Pelvis W Contrast  03/12/2015  CLINICAL DATA:  Lower abdominal tenderness, nausea/ vomiting. EXAM: CT ABDOMEN AND PELVIS WITH CONTRAST TECHNIQUE: Multidetector CT imaging of the abdomen and pelvis was performed using the standard protocol following bolus administration of intravenous contrast. CONTRAST:  37m OMNIPAQUE IOHEXOL 300 MG/ML  SOLN COMPARISON:  02/20/2015 FINDINGS: Lower chest:  Lung bases are essentially clear.  Cardiomegaly. Hepatobiliary: Liver is within normal limits. No suspicious/enhancing hepatic lesions. Status post cholecystectomy. No intrahepatic ductal dilatation. Common duct is mildly prominent but tapers at the ampulla, likely postsurgical. Pancreas: Within normal limits. Spleen: Within normal limits. Adrenals/Urinary Tract: Adrenal glands are within normal limits. 1.5 cm cyst along the lateral left lower kidney (series 2/ image 44). Additional 1.2 cm lateral left upper pole renal cyst (series 7/ image 13). Right kidney is unremarkable. No hydronephrosis. Bladder is within normal limits. Stomach/Bowel: Stomach is notable for a small hiatal hernia. No evidence of bowel obstruction. Prior appendectomy. When compared to the prior CT, the right colonic wall thickening is improved, although a portion of the ascending colon is underdistended. Extensive sigmoid diverticulosis, with mild wall thickening/inflammatory changes along the sigmoid colon in the left lower pelvis (series 2/image 34), suggesting sigmoid diverticulitis. Eccentric wall thickening along with the superior aspect of this loop of colon suggests intramural fluid without discrete abscess within the wall of the sigmoid colon (series 3/image 54). No drainable fluid collection/ abscess. No free air to suggest macroscopic perforation. Vascular/Lymphatic: Atherosclerotic calcifications of the abdominal aorta and branch vessels. No evidence of abdominal aortic aneurysm. No suspicious abdominopelvic lymphadenopathy. Reproductive: Status post hysterectomy. No adnexal masses. Other: No abdominopelvic ascites. Musculoskeletal: Degenerative changes of the visualized thoracolumbar spine. IMPRESSION: Suspected sigmoid diverticulitis. No drainable fluid collection/abscess.  No free air. Electronically Signed   By: SJulian HyM.D.   On: 03/12/2015 21:19   I have personally reviewed and evaluated these images and lab results as part of my medical  decision-making.   EKG Interpretation   Date/Time:  Monday March 12 2015 18:51:48 EST Ventricular Rate:  59 PR Interval:  198 QRS Duration: 99 QT Interval:  436 QTC Calculation: 432 R Axis:   109 Text Interpretation:  Sinus rhythm Ventricular premature complex Right  axis deviation Consider left  ventricular hypertrophy Nonspecific T  abnormalities, lateral leads since last tracing no significant change  Confirmed by BELFI  MD, MELANIE ([id number]) on 03/12/2015 7:11:14 PM      MDM   Final diagnoses:  Non-intractable vomiting with nausea, vomiting of unspecified type  Diverticulitis of large intestine without perforation or abscess without bleeding  Essential hypertension  Chemotherapy-induced neutropenia ([place])    81 y.o. female here with complaints of nausea and vomiting since undergoing chemotherapy on Friday. Initially denies any abdominal pain, but on exam she has moderate left lower quadrant tenderness. Was recently admitted for colitis last month. Will get labs, u/a, EKG, and abd CT. Will give fluids and nausea meds. Pt declines pain meds, denies pain unless abdomen is palpated. Will reassess shortly.   10:12 PM U/A contaminated, few bacteria with 6-30 squamous, neg leuks and nitrites, doubt UTI. Trop neg, EKG unchanged from prior, lactic WNL, lipase WNL. CMP with mildly elevated BUN/Cr but close to baseline. CBC with mild neutropenia, baseline anemia. CT abd/pelvis showing sigmoid diverticulitis. Given that pt hasn't tolerating much PO at home despite antiemetics, although better controlled nausea now, would feel better if she were admitted for obs and IV cipro/flagyl for diveriticulitis before transitioning to PO. Will proceed with admission. Discussed with Dr. Tamera Punt who agrees. Of note, pt hasn't taken her BP meds today, BP up in the 200/60s range but pt asymptomatic. Could be given IV meds until tolerating PO better, pt on metoprolol at home but here her HR is in the 60s  therefore will hold off on giving lopressor IV here, will await to discuss with hospitalist on their preference of antihypertensives.   10:41 PM Dr. Dreama Saa returning page, would like pt admitted to tele bed. Holding orders placed. Please see his notes for further documentation of care. Of note, pt allergic to levaquin therefore pharmacy switched order to rocephin.   BP 207/81 mmHg  Pulse 74  Temp(Src) 97.7 F (36.5 C) (Oral)  Resp 17  Ht _0  (1.575 m)  Wt 58.968 kg  BMI 23.77 kg/m2  SpO2 93%  Meds ordered this encounter  Medications  . sodium chloride 0.9 % bolus 1,000 mL    Sig:   . ondansetron (ZOFRAN) injection 4 mg    Sig:   . iohexol (OMNIPAQUE) 300 MG/ML solution 75 mL    Sig:   . iohexol (OMNIPAQUE) 300 MG/ML solution 25 mL    Sig:   . metroNIDAZOLE (FLAGYL) IVPB 500 mg    Sig:     Order Specific Question:  Antibiotic Indication:    Answer:  Intra-abdominal Infection  . cefTRIAXone (ROCEPHIN) 2 g in dextrose 5 % 50 mL IVPB    Sig:     Order Specific Question:  Antibiotic Indication:    Answer:  Intra-abdominal     Yukiko Minnich Camprubi-Soms, PA-C 03/12/15 2242  Malvin Johns, MD 03/12/15 2245

## 2015-03-12 NOTE — ED Notes (Signed)
Transported to CT scan

## 2015-03-12 NOTE — H&P (Signed)
History and Physical  [name]:[id number] DOB: [birth year] DOA: 03/12/2015  PCP: [name], MD   Chief Complaint: Nausea and vomiting   History of Present Illness:  - Patient is a 81 year old female with history of MM on chemotherapy, Afib and HTN. - She presented with cc of N/V for the past two days. She was not able to eat any meal or hold food/medications in her stomach. Vomitus in non bloody. No abdominal pain, diarrhea or constipation.  - She had a similar complaint before after chemotherapy but she was able to control it with oral antiemetic medications at home, unlike this time.  - She denies any other symptoms including: abdominal pain, fever,chills, chest pain, dyspnea, dysuria.  - No other complaints.   Review of Systems:  All other systems were reviewed and were negative except as per HPI.   Past Medical and Surgical History:   Past Medical History  Diagnosis Date  . COPD (chronic obstructive pulmonary disease) ([place])   . Gout   . Coronary atherosclerosis of native coronary artery 02/05/2012  . Hypertension   . High blood cholesterol   . Heart murmur     "all my life" (02/05/2012)  . Anginal pain ([place])   . Myocardial infarction ([place]) 1990's; 2000's    "2 total, I think" (02/05/2012)  . Pneumonia     "several times" (02/05/2012)  . Chronic bronchitis ([place])   . Shortness of breath     "sometimes when I'm laying down; always when I do too much" (02/05/2012)  . History of blood transfusion   . External bleeding hemorrhoids     "act up at times" (02/05/2012)  . Chronic back pain     "all over" (02/05/2012)  . Depression   . CAD (coronary artery disease) of artery bypass graft 02/09/14    atretic LIMA  . Multiple myeloma ([place]) 09/07/2014  . A-fib ([place]) 10/09/2014   Past Surgical History  Procedure Laterality Date  . Appendectomy  ? 1970's  . Ectopic pregnancy surgery  1970's  . Abdominal hysterectomy  ? 1970's    "partial 1st time; complete  2nd" (02/05/2012)  . Cholecystectomy  ~ 2010  . Cataract extraction w/ intraocular lens  implant, bilateral  [phone]  . Coronary angioplasty with stent placement  2005  . Coronary artery bypass graft  2005    CABG X2  . Cardiac catheterization  02/09/14    atretic LIMA,  patent VG-dRCA and Total occlusion of the native RCA proximally prior to remotely placement RCA stent, with mild proximal 30% LAD stenosis and 30% distal circumflex stenoses.  EF 55%.  . Left heart catheterization with coronary/graft angiogram N/A 02/09/2014    Procedure: LEFT HEART CATHETERIZATION WITH Beatrix Fetters;  Surgeon: Troy Sine, MD;  [facility] C[facility] LAB;  Service: Cardiovascular;  Laterality: N/A;  . Esophagogastroduodenoscopy (egd) with propofol Left 05/22/2014    Procedure: ESOPHAGOGASTRODUODENOSCOPY (EGD) WITH PROPOFOL;  Surgeon: Arta Silence, MD;  Location[facility] ENDOSCOPY;  Service: Endoscopy;  Laterality: Left;    Social History:   reports that she quit smoking about 3 years ago. Her smoking use included Cigarettes. She has a 50 pack-year smoking history. She has never used smokeless tobacco. She reports that she drinks alcohol. She reports that she does not use illicit drugs.   Allergies  Allergen Reactions  . Lexapro [Escitalopram Oxalate] Other (See Comments)    Fatigue   . Soma [Carisoprodol] Other (See Comments)    Fatigue   . Wellbutrin [  Bupropion] Other (See Comments)    "couldn't function and take it"  . Albuterol Sulfate Other (See Comments)    Albuterol HFA inhaler caused nervousness   . Codeine Hives  . Levaquin [Levofloxacin In D5w] Other (See Comments)    Unknown allergic reaction  . Plavix [Clopidogrel Bisulfate] Other (See Comments)    Unknown reaction  . Statins     Unknown reaction     Family History  Problem Relation Age of Onset  . Anemia Neg Hx   . Arrhythmia Neg Hx   . Asthma Neg Hx   . Clotting disorder Neg Hx   . Fainting Neg Hx   . Heart attack  Neg Hx   . Heart disease Neg Hx   . Heart failure Neg Hx   . Hyperlipidemia Neg Hx   . Hypertension Neg Hx   . Aneurysm    . CVA    . CVA Mother   . Aneurysm Father       Prior to Admission medications   Medication Sig Start Date End Date Taking? Authorizing Provider  albuterol-ipratropium (COMBIVENT) 18-103 MCG/ACT inhaler Inhale 1-2 puffs into the lungs every 4 (four) hours as needed for wheezing or shortness of breath.   Yes Historical Provider, MD  aspirin 325 MG tablet Take 325 mg by mouth daily.   Yes Historical Provider, MD  cholecalciferol (VITAMIN D) 1000 UNITS tablet Take 1,000 Units by mouth every morning.    Yes Historical Provider, MD  dexamethasone (DECADRON) 4 MG tablet Take 5 tablets every week before chemotherapy injection. 10/30/14  Yes Carlton Adam, PA-C  ezetimibe (ZETIA) 10 MG tablet Take 1 tablet (10 mg total) by mouth at bedtime. 05/25/14  Yes Hosie Poisson, MD  feeding supplement, ENSURE ENLIVE, (ENSURE ENLIVE) LIQD Take 237 mLs by mouth 3 (three) times daily between meals. 09/13/14  Yes Reyne Dumas, MD  HYDROmorphone (DILAUDID) 4 MG tablet Take 1 tablet (4 mg total) by mouth every 4 (four) hours as needed for severe pain. 03/09/15  Yes Wyatt Portela, MD  isosorbide mononitrate (IMDUR) 30 MG 24 hr tablet Take 1 tablet (30 mg total) by mouth daily. 12/26/14  Yes Belva Crome, MD  Lidocaine 4 % PTCH Apply 1 patch topically daily as needed (pain).   Yes Historical Provider, MD  metoprolol succinate (TOPROL-XL) 25 MG 24 hr tablet Take 1 tablet (25 mg total) by mouth daily. 12/26/14  Yes Belva Crome, MD  naproxen sodium (ANAPROX) 220 MG tablet Take 440-660 mg by mouth 3 (three) times daily as needed (pain).   Yes Historical Provider, MD  nitroGLYCERIN (NITROSTAT) 0.4 MG SL tablet Place 0.4 mg under the tongue every 5 (five) minutes as needed for chest pain.   Yes Historical Provider, MD  Omega-3 Fatty Acids (FISH OIL) 1200 MG CAPS Take 1,200-2,400 mg by mouth 2 (two)  times daily. Take 2 capsules (2400 mg) every morning and 1 capsule (1200 mg) every night   Yes Historical Provider, MD  OVER THE COUNTER MEDICATION Take 2 tablets by mouth at bedtime as needed (restless legs). otc restless leg med   Yes Historical Provider, MD  pantoprazole (PROTONIX) 40 MG tablet Take 1 tablet (40 mg total) by mouth 2 (two) times daily. 05/25/14  Yes Hosie Poisson, MD  polyethylene glycol powder (GLYCOLAX/MIRALAX) powder Take 17 g by mouth every morning. Mix with 8 oz liquid and drink 05/16/14  Yes Historical Provider, MD  [employer]   Yes Historical Provider,  MD  prochlorperazine (COMPAZINE) 10 MG tablet Take 1 tablet (10 mg total) by mouth every 8 (eight) hours as needed for nausea. 02/23/15  Yes Maryann Mikhail, DO  sennosides-docusate sodium (SENOKOT-S) 8.6-50 MG tablet Take 2 tablets by mouth daily as needed for constipation.   Yes Historical Provider, MD  tiotropium (SPIRIVA) 18 MCG inhalation capsule Place 18 mcg into inhaler and inhale daily as needed (shortness of breath).    Yes Historical Provider, MD  acetaminophen (TYLENOL) 325 MG tablet Take 2 tablets (650 mg total) by mouth every 6 (six) hours as needed for mild pain (or Fever >/= 101). 09/13/14   Reyne Dumas, MD  acyclovir (ZOVIRAX) 400 MG tablet Take 1 tablet (400 mg total) by mouth daily. Patient not taking: Reported on 03/12/2015 10/27/14   Carlton Adam, PA-C  oxyCODONE-acetaminophen (PERCOCET/ROXICET) 5-325 MG tablet Take 1-2 tablets by mouth every 6 (six) hours as needed for moderate pain or severe pain. 03/09/15   Wyatt Portela, MD    Physical Exam: BP 207/81 mmHg  Pulse 56  Temp(Src) 97.7 F (36.5 C) (Oral)  Resp 15  Ht $R'5\' 2"'cJ$  (1.575 m)  Wt 58.968 kg (130 lb)  BMI 23.77 kg/m2  SpO2 92%  GENERAL : Well developed, well nourished, alert and cooperative, and appears to be in no acute distress. HEAD: normocephalic. EYES: PERRL, EOMI.vision is grossly intact. EARS:  hearing grossly  intact. NOSE: No nasal discharge. THROAT: Oral cavity and pharynx normal.  NECK: Neck supple CARDIAC: Normal S1 and S2. No S3, S4 or murmurs. Rhythm is regular. LUNGS: Clear to auscultation and percussion without rales, rhonchi, wheezing or diminished breath sounds. ABDOMEN: Positive bowel sounds. Soft, nondistended, LLQ tenderness to palpatoin  EXTREMITIES: No significant deformity or joint abnormality. No edema. Peripheral pulses intact. No varicosities. NEUROLOGICAL: The mental examination revealed the patient was oriented to person, place, and time.CN II-XII intact. Strength and sensation symmetric and intact throughout.  SKIN: Skin normal color. PSYCHIATRIC:  The patient was able to demonstrate good judgement and reason, without hallucinations, abnormal affect or abnormal behaviors during the examination. Patient is not suicidal.          Labs on Admission:  Reviewed.   Radiological Exams on Admission: Ct Abdomen Pelvis W Contrast  03/12/2015  CLINICAL DATA:  Lower abdominal tenderness, nausea/ vomiting. EXAM: CT ABDOMEN AND PELVIS WITH CONTRAST TECHNIQUE: Multidetector CT imaging of the abdomen and pelvis was performed using the standard protocol following bolus administration of intravenous contrast. CONTRAST:  26mL OMNIPAQUE IOHEXOL 300 MG/ML  SOLN COMPARISON:  02/20/2015 FINDINGS: Lower chest:  Lung bases are essentially clear. Cardiomegaly. Hepatobiliary: Liver is within normal limits. No suspicious/enhancing hepatic lesions. Status post cholecystectomy. No intrahepatic ductal dilatation. Common duct is mildly prominent but tapers at the ampulla, likely postsurgical. Pancreas: Within normal limits. Spleen: Within normal limits. Adrenals/Urinary Tract: Adrenal glands are within normal limits. 1.5 cm cyst along the lateral left lower kidney (series 2/ image 44). Additional 1.2 cm lateral left upper pole renal cyst (series 7/ image 13). Right kidney is unremarkable. No hydronephrosis.  Bladder is within normal limits. Stomach/Bowel: Stomach is notable for a small hiatal hernia. No evidence of bowel obstruction. Prior appendectomy. When compared to the prior CT, the right colonic wall thickening is improved, although a portion of the ascending colon is underdistended. Extensive sigmoid diverticulosis, with mild wall thickening/inflammatory changes along the sigmoid colon in the left lower pelvis (series 2/image 34), suggesting sigmoid diverticulitis. Eccentric wall thickening along with the superior aspect  of this loop of colon suggests intramural fluid without discrete abscess within the wall of the sigmoid colon (series 3/image 54). No drainable fluid collection/ abscess. No free air to suggest macroscopic perforation. Vascular/Lymphatic: Atherosclerotic calcifications of the abdominal aorta and branch vessels. No evidence of abdominal aortic aneurysm. No suspicious abdominopelvic lymphadenopathy. Reproductive: Status post hysterectomy. No adnexal masses. Other: No abdominopelvic ascites. Musculoskeletal: Degenerative changes of the visualized thoracolumbar spine. IMPRESSION: Suspected sigmoid diverticulitis. No drainable fluid collection/abscess.  No free air. Electronically Signed   By: Julian Hy M.D.   On: 03/12/2015 21:19      Assessment/Plan  Acute diverticulitis:  Keep NPO Started on Zosyn Start IVF Advance diet as tolerated   Nausea/vomiting:  Likely due to chemotherapy  Started on Zofran prn Advance diet as tolerated  Hypertensive urgency:  Likely due to not taking meds due to above Continue home meds  Afib:  Hold metoprolol, running 40s HR   DVT prophylaxis: [place] enoxaparin  GI prophylaxis: Protonix daily Code Status: Full    Gennaro Africa M.D [facility]

## 2015-03-12 NOTE — ED Notes (Signed)
Bed: WA08 Expected date:  Expected time:  Means of arrival:  Comments: Triage 2 

## 2015-03-12 NOTE — ED Notes (Signed)
Nurse drawing labs. 

## 2015-03-12 NOTE — ED Notes (Signed)
Mercedes PA made aware of BP.

## 2015-03-12 NOTE — ED Notes (Signed)
PA at bedside.

## 2015-03-12 NOTE — ED Notes (Signed)
Pt states that she had chemo on Friday.  States that she has been "sick" ever since with NV.  Denies pain.

## 2015-03-13 ENCOUNTER — Encounter (HOSPITAL_COMMUNITY): Payer: Self-pay

## 2015-03-13 DIAGNOSIS — R112 Nausea with vomiting, unspecified: Secondary | ICD-10-CM | POA: Diagnosis not present

## 2015-03-13 DIAGNOSIS — I25708 Atherosclerosis of coronary artery bypass graft(s), unspecified, with other forms of angina pectoris: Secondary | ICD-10-CM | POA: Diagnosis not present

## 2015-03-13 DIAGNOSIS — K5732 Diverticulitis of large intestine without perforation or abscess without bleeding: Secondary | ICD-10-CM | POA: Diagnosis not present

## 2015-03-13 DIAGNOSIS — I480 Paroxysmal atrial fibrillation: Secondary | ICD-10-CM | POA: Diagnosis not present

## 2015-03-13 LAB — CBC WITH DIFFERENTIAL/PLATELET
BASOS ABS: 0 10*3/uL (ref 0.0–0.1)
BASOS PCT: 0 %
Eosinophils Absolute: 0 10*3/uL (ref 0.0–0.7)
Eosinophils Relative: 1 %
HEMATOCRIT: 31.5 % — AB (ref 36.0–46.0)
HEMOGLOBIN: 9.9 g/dL — AB (ref 12.0–15.0)
LYMPHS PCT: 49 %
Lymphs Abs: 1.8 10*3/uL (ref 0.7–4.0)
MCH: 30.7 pg (ref 26.0–34.0)
MCHC: 31.4 g/dL (ref 30.0–36.0)
MCV: 97.8 fL (ref 78.0–100.0)
Monocytes Absolute: 0.3 10*3/uL (ref 0.1–1.0)
Monocytes Relative: 9 %
NEUTROS ABS: 1.5 10*3/uL — AB (ref 1.7–7.7)
NEUTROS PCT: 41 %
Platelets: 121 10*3/uL — ABNORMAL LOW (ref 150–400)
RBC: 3.22 MIL/uL — AB (ref 3.87–5.11)
RDW: 16 % — ABNORMAL HIGH (ref 11.5–15.5)
WBC: 3.6 10*3/uL — AB (ref 4.0–10.5)

## 2015-03-13 LAB — SPEP & IFE WITH QIG
ABNORMAL PROTEIN BAND1: 2.4 g/dL
ALPHA-1-GLOBULIN: 0.4 g/dL — AB (ref 0.2–0.3)
ALPHA-2-GLOBULIN: 1 g/dL — AB (ref 0.5–0.9)
Albumin ELP: 4.3 g/dL (ref 3.8–4.8)
BETA GLOBULIN: 0.4 g/dL (ref 0.4–0.6)
Beta 2: 0.3 g/dL (ref 0.2–0.5)
GAMMA GLOBULIN: 2.6 g/dL — AB (ref 0.8–1.7)
IGG (IMMUNOGLOBIN G), SERUM: 3140 mg/dL — AB (ref 690–1700)
IgA: 27 mg/dL — ABNORMAL LOW (ref 69–380)
IgM, Serum: <5 mg/dL — ABNORMAL LOW (ref 52–322)
TOTAL PROTEIN, SERUM ELECTROPHOR: 8.9 g/dL — AB (ref 6.1–8.1)

## 2015-03-13 LAB — COMPREHENSIVE METABOLIC PANEL
ALBUMIN: 3.2 g/dL — AB (ref 3.5–5.0)
ALK PHOS: 52 U/L (ref 38–126)
ALT: 14 U/L (ref 14–54)
ANION GAP: 5 (ref 5–15)
AST: 21 U/L (ref 15–41)
BILIRUBIN TOTAL: 0.4 mg/dL (ref 0.3–1.2)
BUN: 22 mg/dL — ABNORMAL HIGH (ref 6–20)
CALCIUM: 8.5 mg/dL — AB (ref 8.9–10.3)
CO2: 28 mmol/L (ref 22–32)
Chloride: 105 mmol/L (ref 101–111)
Creatinine, Ser: 0.95 mg/dL (ref 0.44–1.00)
GFR, EST NON AFRICAN AMERICAN: 55 mL/min — AB (ref >60)
GLUCOSE: 105 mg/dL — AB (ref 65–99)
POTASSIUM: 3.6 mmol/L (ref 3.5–5.1)
Sodium: 138 mmol/L (ref 135–145)
TOTAL PROTEIN: 7.3 g/dL (ref 6.5–8.1)

## 2015-03-13 LAB — KAPPA/LAMBDA LIGHT CHAINS
KAPPA FREE LGHT CHN: 7.36 mg/dL — AB (ref 0.33–1.94)
KAPPA LAMBDA RATIO: 13.38 — AB (ref 0.26–1.65)
Lambda Free Lght Chn: 0.55 mg/dL — ABNORMAL LOW (ref 0.57–2.63)

## 2015-03-13 MED ORDER — IPRATROPIUM-ALBUTEROL 0.5-2.5 (3) MG/3ML IN SOLN: 3.0000 mL | RESPIRATORY_TRACT | Status: DC | PRN

## 2015-03-13 MED ORDER — CETYLPYRIDINIUM CHLORIDE 0.05 % MT LIQD
7.0000 mL | Freq: Two times a day (BID) | OROMUCOSAL | Status: DC
  Administered 2015-03-13 – 2015-03-17 (×8): 7 mL via OROMUCOSAL

## 2015-03-13 MED ORDER — METOPROLOL SUCCINATE ER 25 MG PO TB24
25.0000 mg | ORAL_TABLET | Freq: Once | ORAL | Status: AC
  Administered 2015-03-13: 25 mg via ORAL
  Filled 2015-03-13: qty 1

## 2015-03-13 MED ORDER — HYDROMORPHONE HCL 4 MG PO TABS
4.0000 mg | ORAL_TABLET | ORAL | Status: DC | PRN
Start: 2015-03-13 — End: 2015-03-17
  Administered 2015-03-13 – 2015-03-15 (×4): 4 mg via ORAL
  Filled 2015-03-13 (×4): qty 1

## 2015-03-13 MED ORDER — HYDROMORPHONE HCL 1 MG/ML IJ SOLN
0.5000 mg | Freq: Once | INTRAMUSCULAR | Status: AC
  Administered 2015-03-13: 0.5 mg via INTRAVENOUS
  Filled 2015-03-13: qty 1

## 2015-03-13 MED ORDER — METOPROLOL SUCCINATE ER 25 MG PO TB24
25.0000 mg | ORAL_TABLET | Freq: Every day | ORAL | Status: DC
  Administered 2015-03-13 – 2015-03-17 (×4): 25 mg via ORAL
  Filled 2015-03-13 (×5): qty 1

## 2015-03-13 MED ORDER — ACYCLOVIR 200 MG PO CAPS
400.0000 mg | ORAL_CAPSULE | Freq: Every day | ORAL | Status: DC
  Administered 2015-03-13 – 2015-03-14 (×2): 400 mg via ORAL
  Filled 2015-03-13 (×4): qty 2

## 2015-03-13 MED ORDER — OXYCODONE-ACETAMINOPHEN 5-325 MG PO TABS: 1.0000 | ORAL_TABLET | Freq: Four times a day (QID) | ORAL | Status: DC | PRN

## 2015-03-13 NOTE — Progress Notes (Signed)
Progress Note   [name] CVE:[id number] DOB: 1931/10/29 DOA: 03/12/2015 PCP: [name], MD   Brief Narrative:   [name] is an 81 y.o. female with a PMH of plasma cell dyscrasia/anemia/thrombocytopenia, status post blood transfusions, status post systemic therapy with Velcade and dexamethasone weekly which began 09/22/14, followed by Dr. Alen Blew, who was admitted 03/12/15 with chief complaint of a 2 day history of nausea and vomiting. CT scan of the abdomen and pelvis done on admission showed probable acute diverticulitis without drainable abscess.  Assessment/Plan:   Principal Problem:   Diverticulitis with vomiting - Continue empiric Zosyn and IV fluids. - Currently on bowel rest. Advance diet as tolerated. - Zofran ordered as needed for nausea/vomiting.  Active Problems:   GERD (gastroesophageal reflux disease) - Continue Protonix IV.    COPD (chronic obstructive pulmonary disease) (HCC) - Continue Spiriva.    Hypertension - Continue metoprolol.    CAD (coronary artery disease) of artery bypass graft, atretic LIMA - Continue aspirin, metoprolol and Imdur.    Protein-calorie malnutrition, severe ([place]) - Currently on bowel rest. Advance diet as tolerated.    Plasma cell disorder/Multiple myeloma (HCC) - Discussed the patient's admission with Dr. Alen Blew.    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 Transfuse for hemoglobin less than 7 or evidence of active bleeding.    A-fib, CHA2DS2-VASc score 7 - Has been on Xarelto in the past. It appears this was discontinued in September secondary to dark stools.    DVT Prophylaxis - Lovenox ordered.   Family Communication/Anticipated D/C date and plan/Code Status   Family Communication: No family at the bedside. Disposition Plan: Home when afebrile x 24 hours, and diet successfully advanced. Anticipated D/C date:   1-2 days. Code Status: Full code.   IV Access:    Peripheral  IV   Procedures and diagnostic studies:   Ct Abdomen Pelvis W Contrast  03/12/2015  CLINICAL DATA:  Lower abdominal tenderness, nausea/ vomiting. EXAM: CT ABDOMEN AND PELVIS WITH CONTRAST TECHNIQUE: Multidetector CT imaging of the abdomen and pelvis was performed using the standard protocol following bolus administration of intravenous contrast. CONTRAST:  52mL OMNIPAQUE IOHEXOL 300 MG/ML  SOLN COMPARISON:  02/20/2015 FINDINGS: Lower chest:  Lung bases are essentially clear. Cardiomegaly. Hepatobiliary: Liver is within normal limits. No suspicious/enhancing hepatic lesions. Status post cholecystectomy. No intrahepatic ductal dilatation. Common duct is mildly prominent but tapers at the ampulla, likely postsurgical. Pancreas: Within normal limits. Spleen: Within normal limits. Adrenals/Urinary Tract: Adrenal glands are within normal limits. 1.5 cm cyst along the lateral left lower kidney (series 2/ image 44). Additional 1.2 cm lateral left upper pole renal cyst (series 7/ image 13). Right kidney is unremarkable. No hydronephrosis. Bladder is within normal limits. Stomach/Bowel: Stomach is notable for a small hiatal hernia. No evidence of bowel obstruction. Prior appendectomy. When compared to the prior CT, the right colonic wall thickening is improved, although a portion of the ascending colon is underdistended. Extensive sigmoid diverticulosis, with mild wall thickening/inflammatory changes along the sigmoid colon in the left lower pelvis (series 2/image 34), suggesting sigmoid diverticulitis. Eccentric wall thickening along with the superior aspect of this loop of colon suggests intramural fluid without discrete abscess within the wall of the sigmoid colon (series 3/image 54). No drainable fluid collection/ abscess. No free air to suggest macroscopic perforation. Vascular/Lymphatic: Atherosclerotic calcifications of the abdominal aorta and branch vessels. No evidence of abdominal aortic aneurysm. No  suspicious abdominopelvic lymphadenopathy. Reproductive: Status post hysterectomy. No adnexal  masses. Other: No abdominopelvic ascites. Musculoskeletal: Degenerative changes of the visualized thoracolumbar spine. IMPRESSION: Suspected sigmoid diverticulitis. No drainable fluid collection/abscess.  No free air. Electronically Signed   By: Julian Hy M.D.   On: 03/12/2015 21:19     Medical Consultants:    None.  Anti-Infectives:    Flagyl 03/12/15--->  Zosyn 03/12/15--->  Rocephin 03/12/15--->03/12/15  Subjective:   [name] feels better.  No further vomiting.  Tolerated clear liquids this morning.  Denies any further diarrhea or black/bloody stools.  Objective:    Filed Vitals:   03/12/15 2350 03/13/15 0000 03/13/15 0049 03/13/15 0450  BP:  203/73 207/64 189/55  Pulse:  55 51 48  Temp:   97.4 F (36.3 C) 98 F (36.7 C)  TempSrc:   Oral Oral  Resp:  $Remo'19 20 20  'iyPdk$ Height:   '5\' 2"'$  (1.575 m)   Weight:   57.516 kg (126 lb 12.8 oz)   SpO2: 96% 97% 92% 98%    Intake/Output Summary (Last 24 hours) at 03/13/15 0810 Last data filed at 03/12/15 2348  Gross per 24 hour  Intake     50 ml  Output      0 ml  Net     50 ml   Filed Weights   03/12/15 2223 03/13/15 0049  Weight: 58.968 kg (130 lb) 57.516 kg (126 lb 12.8 oz)    Exam: Gen:  NAD Cardiovascular:  RRR, No M/R/G Respiratory:  Lungs CTAB Gastrointestinal:  Abdomen soft, NT/ND, + BS Extremities:  No C/E/C   Data Reviewed:    Labs: Basic Metabolic Panel:  Recent Labs Lab 03/09/15 1532 03/12/15 1815 03/13/15 0436  NA 139 139 138  K 3.8 3.6 3.6  CL  --  104 105  CO2 $Re'25 27 28  'LWI$ GLUCOSE 157* 106* 105*  BUN 22.7 28* 22*  CREATININE 1.4* 1.04* 0.95  CALCIUM 9.6 9.3 8.5*   GFR Estimated Creatinine Clearance: 36.7 mL/min (by C-G formula based on Cr of 0.95). Liver Function Tests:  Recent Labs Lab 03/09/15 1532 03/12/15 1815 03/13/15 0436  AST $Re'23 23 21  'YGd$ ALT $R'13 14 14  'Vz$ ALKPHOS 61 55 52   BILITOT 0.43 0.4 0.4  PROT 9.0* 8.3* 7.3  ALBUMIN 3.8 3.5 3.2*    Recent Labs Lab 03/12/15 1815  LIPASE 26   CBC:  Recent Labs Lab 03/09/15 1503 03/12/15 1815 03/13/15 0436  WBC 4.3 3.2* 3.6*  NEUTROABS 3.0 1.4* 1.5*  HGB 11.0* 10.4* 9.9*  HCT 34.0* 32.9* 31.5*  MCV 94.6 95.6 97.8  PLT 216 132* 121*   Sepsis Labs:  Recent Labs Lab 03/09/15 1503 03/12/15 1815 03/12/15 1858 03/13/15 0436  WBC 4.3 3.2*  --  3.6*  LATICACIDVEN  --   --  0.70  --    Microbiology No results found for this or any previous visit (from the past 240 hour(s)).   Medications:   . antiseptic oral rinse  7 mL Mouth Rinse BID  . aspirin  325 mg Oral Daily  . enoxaparin (LOVENOX) injection  40 mg Subcutaneous Q24H  . ezetimibe  10 mg Oral QHS  . isosorbide mononitrate  30 mg Oral Daily  . metoprolol succinate  25 mg Oral Daily  . pantoprazole (PROTONIX) IV  40 mg Intravenous Q24H  . piperacillin-tazobactam (ZOSYN)  IV  3.375 g Intravenous Q8H   Continuous Infusions: . dextrose 5 % and 0.9% NaCl 75 mL/hr at 03/13/15 0102    Time spent: 35 minutes.  The patient  is medically complex with multiple co-morbidities and is at high risk for clinical deterioration and requires high complexity decision making.      [facility] Pager [phone]. If unable to reach me by pager, please call my cell phone at [phone].  *Please refer to [email or web address], password [email or web address] to get updated schedule on who will round on this patient, as hospitalists switch teams weekly. If 7PM-7AM, please contact night-coverage at [email or web address], password [email or web address] for any overnight needs.  03/13/2015, 8:10 AM

## 2015-03-13 NOTE — Progress Notes (Deleted)
Pt states pain after IM Toradol is some better Pt states "It feels now like my leg is sore like I have a big bruise up my leg." pt is able to ambulate in the hallway and tolerated activity fair. MD paged to give update

## 2015-03-14 DIAGNOSIS — R112 Nausea with vomiting, unspecified: Principal | ICD-10-CM

## 2015-03-14 DIAGNOSIS — E440 Moderate protein-calorie malnutrition: Secondary | ICD-10-CM

## 2015-03-14 DIAGNOSIS — I480 Paroxysmal atrial fibrillation: Secondary | ICD-10-CM

## 2015-03-14 DIAGNOSIS — K5732 Diverticulitis of large intestine without perforation or abscess without bleeding: Secondary | ICD-10-CM | POA: Diagnosis not present

## 2015-03-14 DIAGNOSIS — D630 Anemia in neoplastic disease: Secondary | ICD-10-CM | POA: Diagnosis not present

## 2015-03-14 DIAGNOSIS — I10 Essential (primary) hypertension: Secondary | ICD-10-CM

## 2015-03-14 DIAGNOSIS — I25708 Atherosclerosis of coronary artery bypass graft(s), unspecified, with other forms of angina pectoris: Secondary | ICD-10-CM | POA: Diagnosis not present

## 2015-03-14 DIAGNOSIS — C9002 Multiple myeloma in relapse: Secondary | ICD-10-CM

## 2015-03-14 DIAGNOSIS — K219 Gastro-esophageal reflux disease without esophagitis: Secondary | ICD-10-CM

## 2015-03-14 DIAGNOSIS — E43 Unspecified severe protein-calorie malnutrition: Secondary | ICD-10-CM

## 2015-03-14 DIAGNOSIS — D729 Disorder of white blood cells, unspecified: Secondary | ICD-10-CM

## 2015-03-14 LAB — BASIC METABOLIC PANEL
Anion gap: 4 — ABNORMAL LOW (ref 5–15)
BUN: 15 mg/dL (ref 6–20)
CHLORIDE: 108 mmol/L (ref 101–111)
CO2: 27 mmol/L (ref 22–32)
CREATININE: 1.04 mg/dL — AB (ref 0.44–1.00)
Calcium: 8.3 mg/dL — ABNORMAL LOW (ref 8.9–10.3)
GFR calc Af Amer: 57 mL/min — ABNORMAL LOW (ref >60)
GFR calc non Af Amer: 49 mL/min — ABNORMAL LOW (ref >60)
Glucose, Bld: 121 mg/dL — ABNORMAL HIGH (ref 65–99)
Potassium: 3.8 mmol/L (ref 3.5–5.1)
Sodium: 139 mmol/L (ref 135–145)

## 2015-03-14 LAB — CBC
HEMATOCRIT: 27.3 % — AB (ref 36.0–46.0)
HEMOGLOBIN: 8.6 g/dL — AB (ref 12.0–15.0)
MCH: 31.2 pg (ref 26.0–34.0)
MCHC: 31.5 g/dL (ref 30.0–36.0)
MCV: 98.9 fL (ref 78.0–100.0)
Platelets: 107 10*3/uL — ABNORMAL LOW (ref 150–400)
RBC: 2.76 MIL/uL — ABNORMAL LOW (ref 3.87–5.11)
RDW: 16.1 % — ABNORMAL HIGH (ref 11.5–15.5)
WBC: 3.2 10*3/uL — AB (ref 4.0–10.5)

## 2015-03-14 MED ORDER — HYDRALAZINE HCL 20 MG/ML IJ SOLN
5.0000 mg | Freq: Once | INTRAMUSCULAR | Status: AC
  Administered 2015-03-14: 5 mg via INTRAVENOUS
  Filled 2015-03-14: qty 1

## 2015-03-14 NOTE — Progress Notes (Addendum)
Pt HR fluctuating, as low as 36, but goes up to 60's and then goes back down. Doesn't sustain long. NP Baltazar Najjar made aware, pt asymptomatic, will continue to monitor.   Janell Quiet, RN

## 2015-03-14 NOTE — Progress Notes (Addendum)
Pt BP elevated, NP Baltazar Najjar paged, new orders given. Will continue to monitor pt.

## 2015-03-14 NOTE — Plan of Care (Signed)
Problem: Nutrition: Goal: Adequate nutrition will be maintained Outcome: Progressing Pt with decreased nausea, diet advanced to soft for lunch

## 2015-03-14 NOTE — Progress Notes (Signed)
[facility] PROGRESS NOTE   [name] ZOX:[id number] DOB: 04/17/1931 DOA: 03/12/2015 PCP: [name], MD  HPI/Subjective: Feels better, wants to try food, will advance diet as tolerated. Less nausea and vomiting.  Assessment/Plan: Principal Problem:   Diverticulitis Active Problems:   GERD (gastroesophageal reflux disease)   COPD (chronic obstructive pulmonary disease) (HCC)   Hypertension   CAD (coronary artery disease) of artery bypass graft, atretic LIMA   Protein-calorie malnutrition, severe (HCC)   Plasma cell disorder   Multiple myeloma (HCC)   Anemia in neoplastic disease   A-fib, CHA2DS2-VASc score 7   Vomiting   Acute diverticulitis:  Patient presented to the hospital with nausea and vomiting and abdominal pain. CT scan showed suspected sigmoid diverticulitis, placed nothing by mouth initially. Patient is started on Zosyn. Diet advance as tolerated, currently on soft diet.  Nausea/vomiting:  Likely due to chemotherapy versus sigmoid diverticulitis.   Started on Zofran prn Advance diet as tolerated  Hypertensive urgency:  Likely due to not taking meds due to above Continue home meds, blood pressure improving.   Afib:  Atrial fibrillation with a slow ventricular response, heart rate was in the 40s. Hold AV nodal blocking medications including metoprolol. CHA2DS2-VASc of 7 patient is on aspirin. Per patient she was on Xarelto and discontinued because of blood in stools and urine.   Code Status: Full Code Family Communication: Plan discussed with the patient. Disposition Plan: Remains inpatient Diet: DIET SOFT Room service appropriate?: Yes; Fluid consistency:: Thin  Consultants:  None  Procedures:  None  Antibiotics:  None   Objective: Filed Vitals:   03/14/15 0641 03/14/15 1001  BP: 201/55 150/69  Pulse:  67  Temp:    Resp:      Intake/Output Summary (Last 24 hours) at 03/14/15 1259 Last data filed at 03/14/15  0700  Gross per 24 hour  Intake 2397.5 ml  Output      0 ml  Net 2397.5 ml   Filed Weights   03/12/15 2223 03/13/15 0049  Weight: 58.968 kg (130 lb) 57.516 kg (126 lb 12.8 oz)    Exam: General: Alert and awake, oriented x3, not in any acute distress. HEENT: anicteric sclera, pupils reactive to light and accommodation, EOMI CVS: S1-S2 clear, no murmur rubs or gallops Chest: clear to auscultation bilaterally, no wheezing, rales or rhonchi Abdomen: soft nontender, nondistended, normal bowel sounds, no organomegaly Extremities: no cyanosis, clubbing or edema noted bilaterally Neuro: Cranial nerves II-XII intact, no focal neurological deficits  Data Reviewed: Basic Metabolic Panel:  Recent Labs Lab 03/09/15 1532 03/12/15 1815 03/13/15 0436 03/14/15 0437  NA 139 139 138 139  K 3.8 3.6 3.6 3.8  CL  --  104 105 108  CO2 $Re'25 27 28 27  'IWB$ GLUCOSE 157* 106* 105* 121*  BUN 22.7 28* 22* 15  CREATININE 1.4* 1.04* 0.95 1.04*  CALCIUM 9.6 9.3 8.5* 8.3*   Liver Function Tests:  Recent Labs Lab 03/09/15 1532 03/12/15 1815 03/13/15 0436  AST $Re'23 23 21  'vet$ ALT $R'13 14 14  'AB$ ALKPHOS 61 55 52  BILITOT 0.43 0.4 0.4  PROT 9.0* 8.3* 7.3  ALBUMIN 3.8 3.5 3.2*    Recent Labs Lab 03/12/15 1815  LIPASE 26   No results for input(s): AMMONIA in the last 168 hours. CBC:  Recent Labs Lab 03/09/15 1503 03/12/15 1815 03/13/15 0436 03/14/15 0437  WBC 4.3 3.2* 3.6* 3.2*  NEUTROABS 3.0 1.4* 1.5*  --   HGB 11.0* 10.4* 9.9* 8.6*  HCT 34.0* 32.9* 31.5*  27.3*  MCV 94.6 95.6 97.8 98.9  PLT 216 132* 121* 107*   Cardiac Enzymes: No results for input(s): CKTOTAL, CKMB, CKMBINDEX, TROPONINI in the last 168 hours. BNP (last 3 results)  Recent Labs  05/23/14 1445 08/13/14 1216 10/09/14 1524  BNP 537.1* 150.0* 537.4*    ProBNP (last 3 results) No results for input(s): PROBNP in the last 8760 hours.  CBG: No results for input(s): GLUCAP in the last 168 hours.  Micro No results found for  this or any previous visit (from the past 240 hour(s)).   Studies: Ct Abdomen Pelvis W Contrast  03/12/2015  CLINICAL DATA:  Lower abdominal tenderness, nausea/ vomiting. EXAM: CT ABDOMEN AND PELVIS WITH CONTRAST TECHNIQUE: Multidetector CT imaging of the abdomen and pelvis was performed using the standard protocol following bolus administration of intravenous contrast. CONTRAST:  69mL OMNIPAQUE IOHEXOL 300 MG/ML  SOLN COMPARISON:  02/20/2015 FINDINGS: Lower chest:  Lung bases are essentially clear. Cardiomegaly. Hepatobiliary: Liver is within normal limits. No suspicious/enhancing hepatic lesions. Status post cholecystectomy. No intrahepatic ductal dilatation. Common duct is mildly prominent but tapers at the ampulla, likely postsurgical. Pancreas: Within normal limits. Spleen: Within normal limits. Adrenals/Urinary Tract: Adrenal glands are within normal limits. 1.5 cm cyst along the lateral left lower kidney (series 2/ image 44). Additional 1.2 cm lateral left upper pole renal cyst (series 7/ image 13). Right kidney is unremarkable. No hydronephrosis. Bladder is within normal limits. Stomach/Bowel: Stomach is notable for a small hiatal hernia. No evidence of bowel obstruction. Prior appendectomy. When compared to the prior CT, the right colonic wall thickening is improved, although a portion of the ascending colon is underdistended. Extensive sigmoid diverticulosis, with mild wall thickening/inflammatory changes along the sigmoid colon in the left lower pelvis (series 2/image 34), suggesting sigmoid diverticulitis. Eccentric wall thickening along with the superior aspect of this loop of colon suggests intramural fluid without discrete abscess within the wall of the sigmoid colon (series 3/image 54). No drainable fluid collection/ abscess. No free air to suggest macroscopic perforation. Vascular/Lymphatic: Atherosclerotic calcifications of the abdominal aorta and branch vessels. No evidence of abdominal aortic  aneurysm. No suspicious abdominopelvic lymphadenopathy. Reproductive: Status post hysterectomy. No adnexal masses. Other: No abdominopelvic ascites. Musculoskeletal: Degenerative changes of the visualized thoracolumbar spine. IMPRESSION: Suspected sigmoid diverticulitis. No drainable fluid collection/abscess.  No free air. Electronically Signed   By: Julian Hy M.D.   On: 03/12/2015 21:19    Scheduled Meds: . acyclovir  400 mg Oral Daily  . antiseptic oral rinse  7 mL Mouth Rinse BID  . aspirin  325 mg Oral Daily  . enoxaparin (LOVENOX) injection  40 mg Subcutaneous Q24H  . ezetimibe  10 mg Oral QHS  . isosorbide mononitrate  30 mg Oral Daily  . metoprolol succinate  25 mg Oral Daily  . pantoprazole (PROTONIX) IV  40 mg Intravenous Q24H  . piperacillin-tazobactam (ZOSYN)  IV  3.375 g Intravenous Q8H   Continuous Infusions: . dextrose 5 % and 0.9% NaCl 75 mL/hr at 03/14/15 0302       Time spent: 35 minutes    [facility] A  [facility] Pager [phone] If 7PM-7AM, please contact night-coverage at [email or web address], password [facility] 03/14/2015, 12:59 PM

## 2015-03-15 DIAGNOSIS — I480 Paroxysmal atrial fibrillation: Secondary | ICD-10-CM | POA: Diagnosis not present

## 2015-03-15 DIAGNOSIS — R112 Nausea with vomiting, unspecified: Secondary | ICD-10-CM | POA: Diagnosis not present

## 2015-03-15 DIAGNOSIS — D630 Anemia in neoplastic disease: Secondary | ICD-10-CM | POA: Diagnosis not present

## 2015-03-15 DIAGNOSIS — K5732 Diverticulitis of large intestine without perforation or abscess without bleeding: Secondary | ICD-10-CM | POA: Diagnosis not present

## 2015-03-15 DIAGNOSIS — J42 Unspecified chronic bronchitis: Secondary | ICD-10-CM

## 2015-03-15 LAB — CBC
HEMATOCRIT: 26.5 % — AB (ref 36.0–46.0)
HEMOGLOBIN: 8.4 g/dL — AB (ref 12.0–15.0)
MCH: 30.4 pg (ref 26.0–34.0)
MCHC: 31.7 g/dL (ref 30.0–36.0)
MCV: 96 fL (ref 78.0–100.0)
Platelets: 102 10*3/uL — ABNORMAL LOW (ref 150–400)
RBC: 2.76 MIL/uL — AB (ref 3.87–5.11)
RDW: 15.9 % — ABNORMAL HIGH (ref 11.5–15.5)
WBC: 3.6 10*3/uL — AB (ref 4.0–10.5)

## 2015-03-15 LAB — BASIC METABOLIC PANEL
ANION GAP: 4 — AB (ref 5–15)
BUN: 10 mg/dL (ref 6–20)
CHLORIDE: 109 mmol/L (ref 101–111)
CO2: 26 mmol/L (ref 22–32)
Calcium: 8.1 mg/dL — ABNORMAL LOW (ref 8.9–10.3)
Creatinine, Ser: 1.03 mg/dL — ABNORMAL HIGH (ref 0.44–1.00)
GFR calc Af Amer: 57 mL/min — ABNORMAL LOW (ref >60)
GFR, EST NON AFRICAN AMERICAN: 50 mL/min — AB (ref >60)
Glucose, Bld: 113 mg/dL — ABNORMAL HIGH (ref 65–99)
POTASSIUM: 3.4 mmol/L — AB (ref 3.5–5.1)
SODIUM: 139 mmol/L (ref 135–145)

## 2015-03-15 MED ORDER — CIPROFLOXACIN HCL 500 MG PO TABS
500.0000 mg | ORAL_TABLET | Freq: Two times a day (BID) | ORAL | Status: DC
  Administered 2015-03-15 – 2015-03-17 (×3): 500 mg via ORAL
  Filled 2015-03-15 (×4): qty 1

## 2015-03-15 MED ORDER — ACYCLOVIR 400 MG PO TABS
400.0000 mg | ORAL_TABLET | Freq: Every day | ORAL | Status: DC
  Administered 2015-03-15 – 2015-03-17 (×2): 400 mg via ORAL
  Filled 2015-03-15 (×3): qty 1

## 2015-03-15 MED ORDER — POLYETHYLENE GLYCOL 3350 17 G PO PACK
17.0000 g | PACK | Freq: Every day | ORAL | Status: DC
  Administered 2015-03-15 – 2015-03-17 (×2): 17 g via ORAL
  Filled 2015-03-15 (×3): qty 1

## 2015-03-15 MED ORDER — METRONIDAZOLE 500 MG PO TABS
500.0000 mg | ORAL_TABLET | Freq: Three times a day (TID) | ORAL | Status: DC
  Administered 2015-03-15 – 2015-03-17 (×7): 500 mg via ORAL
  Filled 2015-03-15 (×7): qty 1

## 2015-03-15 MED ORDER — AMLODIPINE BESYLATE 5 MG PO TABS
5.0000 mg | ORAL_TABLET | Freq: Every day | ORAL | Status: DC
  Administered 2015-03-15: 5 mg via ORAL
  Filled 2015-03-15 (×2): qty 1

## 2015-03-15 NOTE — Progress Notes (Addendum)
[facility] PROGRESS NOTE   [name] BDZ:[id number] DOB: [birth year] DOA: 03/12/2015 PCP: [name], MD  HPI/Subjective: She tolerated solid diet, denies any fever or chills. No abdominal pain. Still no bowel movement and a MiraLAX. Ambulate  Assessment/Plan: Principal Problem:   Diverticulitis Active Problems:   GERD (gastroesophageal reflux disease)   COPD (chronic obstructive pulmonary disease) (HCC)   Hypertension   CAD (coronary artery disease) of artery bypass graft, atretic LIMA   Protein-calorie malnutrition, severe (HCC)   Plasma cell disorder   Multiple myeloma (HCC)   Anemia in neoplastic disease   A-fib, CHA2DS2-VASc score 7   Vomiting   Acute diverticulitis:  Patient presented to the hospital with nausea and vomiting and abdominal pain. CT scan showed suspected sigmoid diverticulitis, placed nothing by mouth initially. Patient is started on Zosyn, will switch to oral Cipro/Metro. Diet advance as tolerated, currently on soft diet.  Nausea/vomiting:  Likely due to chemotherapy versus sigmoid diverticulitis.   Started on Zofran prn Advance diet as tolerated  Hypertensive urgency:  Likely due to not taking meds due to above Home medications restarted, still has high blood pressure added amlodipine.  Afib:  Atrial fibrillation with a slow ventricular response, heart rate was in the 40s. Hold AV nodal blocking medications including metoprolol. CHA2DS2-VASc of 7 patient is on aspirin. Per patient she was on Xarelto and discontinued by her PCP because of blood in stools and urine.  Hypokalemia Replete with oral supplements.  Code Status: Full Code Family Communication: Plan discussed with the patient. Disposition Plan: Remains inpatient Diet: DIET SOFT Room service appropriate?: Yes; Fluid consistency:: Thin  Consultants:  None  Procedures:  None  Antibiotics:  None   Objective: Filed Vitals:   03/15/15 0637 03/15/15  0851  BP: 181/66   Pulse: 48 63  Temp: 98.1 F (36.7 C)   Resp: 16     Intake/Output Summary (Last 24 hours) at 03/15/15 1039 Last data filed at 03/15/15 0748  Gross per 24 hour  Intake 2418.75 ml  Output      0 ml  Net 2418.75 ml   Filed Weights   03/12/15 2223 03/13/15 0049  Weight: 58.968 kg (130 lb) 57.516 kg (126 lb 12.8 oz)    Exam: General: Alert and awake, oriented x3, not in any acute distress. HEENT: anicteric sclera, pupils reactive to light and accommodation, EOMI CVS: S1-S2 clear, no murmur rubs or gallops Chest: clear to auscultation bilaterally, no wheezing, rales or rhonchi Abdomen: soft nontender, nondistended, normal bowel sounds, no organomegaly Extremities: no cyanosis, clubbing or edema noted bilaterally Neuro: Cranial nerves II-XII intact, no focal neurological deficits  Data Reviewed: Basic Metabolic Panel:  Recent Labs Lab 03/09/15 1532 03/12/15 1815 03/13/15 0436 03/14/15 0437 03/15/15 0444  NA 139 139 138 139 139  K 3.8 3.6 3.6 3.8 3.4*  CL  --  104 105 108 109  CO2 $Re'25 27 28 27 26  'ztA$ GLUCOSE 157* 106* 105* 121* 113*  BUN 22.7 28* 22* 15 10  CREATININE 1.4* 1.04* 0.95 1.04* 1.03*  CALCIUM 9.6 9.3 8.5* 8.3* 8.1*   Liver Function Tests:  Recent Labs Lab 03/09/15 1532 03/12/15 1815 03/13/15 0436  AST $Re'23 23 21  'zNF$ ALT $R'13 14 14  'jZ$ ALKPHOS 61 55 52  BILITOT 0.43 0.4 0.4  PROT 9.0* 8.3* 7.3  ALBUMIN 3.8 3.5 3.2*    Recent Labs Lab 03/12/15 1815  LIPASE 26   No results for input(s): AMMONIA in the last 168 hours. CBC:  Recent Labs  Lab 03/09/15 1503 03/12/15 1815 03/13/15 0436 03/14/15 0437 03/15/15 0444  WBC 4.3 3.2* 3.6* 3.2* 3.6*  NEUTROABS 3.0 1.4* 1.5*  --   --   HGB 11.0* 10.4* 9.9* 8.6* 8.4*  HCT 34.0* 32.9* 31.5* 27.3* 26.5*  MCV 94.6 95.6 97.8 98.9 96.0  PLT 216 132* 121* 107* 102*   Cardiac Enzymes: No results for input(s): CKTOTAL, CKMB, CKMBINDEX, TROPONINI in the last 168 hours. BNP (last 3 results)  Recent  Labs  05/23/14 1445 08/13/14 1216 10/09/14 1524  BNP 537.1* 150.0* 537.4*    ProBNP (last 3 results) No results for input(s): PROBNP in the last 8760 hours.  CBG: No results for input(s): GLUCAP in the last 168 hours.  Micro No results found for this or any previous visit (from the past 240 hour(s)).   Studies: No results found.  Scheduled Meds: . acyclovir  400 mg Oral Daily  . antiseptic oral rinse  7 mL Mouth Rinse BID  . aspirin  325 mg Oral Daily  . enoxaparin (LOVENOX) injection  40 mg Subcutaneous Q24H  . ezetimibe  10 mg Oral QHS  . isosorbide mononitrate  30 mg Oral Daily  . metoprolol succinate  25 mg Oral Daily  . pantoprazole (PROTONIX) IV  40 mg Intravenous Q24H  . piperacillin-tazobactam (ZOSYN)  IV  3.375 g Intravenous Q8H  . polyethylene glycol  17 g Oral Daily   Continuous Infusions: . dextrose 5 % and 0.9% NaCl 75 mL/hr at 03/15/15 0054       Time spent: 35 minutes    [facility] A  [facility] Pager [phone] If 7PM-7AM, please contact night-coverage at [email or web address], password [facility] 03/15/2015, 10:39 AM

## 2015-03-16 ENCOUNTER — Other Ambulatory Visit: Payer: Medicare Other

## 2015-03-16 ENCOUNTER — Ambulatory Visit: Payer: Medicare Other

## 2015-03-16 ENCOUNTER — Other Ambulatory Visit: Payer: Self-pay | Admitting: Oncology

## 2015-03-16 ENCOUNTER — Ambulatory Visit: Payer: Medicare Other | Admitting: Oncology

## 2015-03-16 DIAGNOSIS — K5732 Diverticulitis of large intestine without perforation or abscess without bleeding: Secondary | ICD-10-CM | POA: Diagnosis not present

## 2015-03-16 DIAGNOSIS — I25708 Atherosclerosis of coronary artery bypass graft(s), unspecified, with other forms of angina pectoris: Secondary | ICD-10-CM | POA: Diagnosis not present

## 2015-03-16 DIAGNOSIS — I480 Paroxysmal atrial fibrillation: Secondary | ICD-10-CM | POA: Diagnosis not present

## 2015-03-16 DIAGNOSIS — K5792 Diverticulitis of intestine, part unspecified, without perforation or abscess without bleeding: Secondary | ICD-10-CM | POA: Diagnosis not present

## 2015-03-16 DIAGNOSIS — C9000 Multiple myeloma not having achieved remission: Secondary | ICD-10-CM

## 2015-03-16 DIAGNOSIS — D630 Anemia in neoplastic disease: Secondary | ICD-10-CM | POA: Diagnosis not present

## 2015-03-16 LAB — BASIC METABOLIC PANEL
Anion gap: 5 (ref 5–15)
BUN: 7 mg/dL (ref 6–20)
CALCIUM: 8.4 mg/dL — AB (ref 8.9–10.3)
CO2: 27 mmol/L (ref 22–32)
CREATININE: 0.82 mg/dL (ref 0.44–1.00)
Chloride: 106 mmol/L (ref 101–111)
Glucose, Bld: 96 mg/dL (ref 65–99)
Potassium: 3.4 mmol/L — ABNORMAL LOW (ref 3.5–5.1)
SODIUM: 138 mmol/L (ref 135–145)

## 2015-03-16 MED ORDER — HYDRALAZINE HCL 20 MG/ML IJ SOLN
2.0000 mg | Freq: Once | INTRAMUSCULAR | Status: AC
  Administered 2015-03-16: 2 mg via INTRAVENOUS
  Filled 2015-03-16: qty 1

## 2015-03-16 MED ORDER — LORAZEPAM 0.5 MG PO TABS
0.5000 mg | ORAL_TABLET | Freq: Once | ORAL | Status: AC
  Administered 2015-03-16: 0.5 mg via ORAL
  Filled 2015-03-16: qty 1

## 2015-03-16 MED ORDER — POTASSIUM CHLORIDE CRYS ER 20 MEQ PO TBCR
60.0000 meq | EXTENDED_RELEASE_TABLET | Freq: Once | ORAL | Status: DC
  Filled 2015-03-16: qty 3

## 2015-03-16 MED ORDER — AMLODIPINE BESYLATE 5 MG PO TABS
5.0000 mg | ORAL_TABLET | Freq: Once | ORAL | Status: AC
  Administered 2015-03-16: 5 mg via ORAL
  Filled 2015-03-16: qty 1

## 2015-03-16 MED ORDER — POTASSIUM CHLORIDE CRYS ER 20 MEQ PO TBCR
20.0000 meq | EXTENDED_RELEASE_TABLET | Freq: Once | ORAL | Status: AC
  Administered 2015-03-16: 20 meq via ORAL
  Filled 2015-03-16: qty 1

## 2015-03-16 MED ORDER — MAGNESIUM SULFATE 2 GM/50ML IV SOLN
2.0000 g | Freq: Once | INTRAVENOUS | Status: AC
  Administered 2015-03-16: 2 g via INTRAVENOUS
  Filled 2015-03-16: qty 50

## 2015-03-16 MED ORDER — FUROSEMIDE 10 MG/ML IJ SOLN
40.0000 mg | Freq: Once | INTRAMUSCULAR | Status: AC
  Administered 2015-03-16: 40 mg via INTRAVENOUS
  Filled 2015-03-16: qty 4

## 2015-03-16 MED ORDER — AMLODIPINE BESYLATE 10 MG PO TABS
10.0000 mg | ORAL_TABLET | Freq: Every day | ORAL | Status: DC
  Administered 2015-03-17: 10 mg via ORAL
  Filled 2015-03-16: qty 1

## 2015-03-16 NOTE — Progress Notes (Signed)
IP PROGRESS NOTE  Subjective:   Patient known to me with multiple myeloma currently receiving treatments with Velcade and dexamethasone. Patient was hospitalized this week after complaints of abdominal pain, diarrhea and nausea. CT scan showed possible evidence of diverticulitis. Clinically she has reported some improvement in her symptoms and able to tolerate food. She is still nauseated at this time but for the most part much improved. She does report some back pain that has been treated outpatient lane with diluted.  Objective:  Vital signs in last 24 hours: Temp:  [97.7 F (36.5 C)-97.9 F (36.6 C)] 97.9 F (36.6 C) (12/09 0614) Pulse Rate:  [47-64] 47 (12/09 0614) Resp:  [18] 18 (12/09 0614) BP: (154-216)/(63-82) 188/69 mmHg (12/09 0633) SpO2:  [90 %-95 %] 90 % (12/09 0614) Weight change:  Last BM Date: 03/15/15  Intake/Output from previous day: 12/08 0701 - 12/09 0700 In: 1201.3 [P.O.:840; I.V.:311.3; IV Piggyback:50] Out: -  Alert and awake woman appeared in no active distress. Mouth: mucous membranes moist, pharynx normal without lesions Resp: clear to auscultation bilaterally Cardio: regular rate and rhythm, S1, S2 normal, no murmur, click, rub or gallop GI: soft, non-tender; bowel sounds normal; no masses,  no organomegaly Extremities: extremities normal, atraumatic, no cyanosis or edema  Portacath/PICC-without erythema  Lab Results:  Recent Labs  03/14/15 0437 03/15/15 0444  WBC 3.2* 3.6*  HGB 8.6* 8.4*  HCT 27.3* 26.5*  PLT 107* 102*    BMET  Recent Labs  03/15/15 0444 03/16/15 0540  NA 139 138  K 3.4* 3.4*  CL 109 106  CO2 26 27  GLUCOSE 113* 96  BUN 10 7  CREATININE 1.03* 0.82  CALCIUM 8.1* 8.4*    Studies/Results: No results found.  Medications: I have reviewed the patient's current medications.  Assessment/Plan:  81 year old woman with the following issues:  1. Multiple myeloma currently receiving therapy with Velcade and  dexamethasone. She is scheduled to have treatment today which will be held given her clinical status at this time. Chemotherapy will be resumed next week as I anticipate she will be feeling better.  2. Diverticulitis: I agree with the current management as she appears to be improving.  3. The follow-up: She has a follow-up already scheduled on 03/23/2015 at the [facility].    Jacqueli Pangallo 03/16/2015, 12:25 PM

## 2015-03-16 NOTE — Progress Notes (Signed)
[facility] PROGRESS NOTE   ERETRIA MANTERNACH PPJ:[id number] DOB: 01/28/1932 DOA: 03/12/2015 PCP: [name], MD  HPI/Subjective: Tolerated solid diet very well, she had some nausea this morning likely from switching to oral medications. Blood pressure continues to be elevated.  Assessment/Plan: Principal Problem:   Diverticulitis Active Problems:   GERD (gastroesophageal reflux disease)   COPD (chronic obstructive pulmonary disease) (HCC)   Hypertension   CAD (coronary artery disease) of artery bypass graft, atretic LIMA   Protein-calorie malnutrition, severe (HCC)   Plasma cell disorder   Multiple myeloma (HCC)   Anemia in neoplastic disease   A-fib, CHA2DS2-VASc score 7   Vomiting   Acute diverticulitis:  Patient presented to the hospital with nausea and vomiting and abdominal pain. CT scan showed suspected sigmoid diverticulitis. Patientwas onoswitched to oral Cipro/Metro Diet advanced, tolerated very well, this morning had slight nausea. MiraLAX added, had 1 bowel movement last night.  Nausea/vomiting:  Likely due to chemotherapy versus sigmoid diverticulitis.   Started on Zofran prn Advance diet as tolerated  Hypertensive urgency:  Likely due to not taking meds due to above Home medications restarted, still has high blood pressure added amlodipine. Continues to have high systolic pressure, increase amlodipine to 10 mg and give 1 dose of Lasix.   Afib:  Atrial fibrillation with a slow ventricular response, heart rate was in the 40s. Hold AV nodal blocking medications including metoprolol. CHA2DS2-VASc of 7 patient is on aspirin. Per patient she was on Xarelto and discontinued by her PCP because of blood in stools and urine.  Hypokalemia Replete with oral supplements.  Code Status: Full Code Family Communication: Plan discussed with the patient. Disposition Plan: Remains inpatient Diet: DIET SOFT Room service appropriate?: Yes; Fluid  consistency:: Thin  Consultants:  None  Procedures:  None  Antibiotics:  None   Objective: Filed Vitals:   03/16/15 0633 03/16/15 1347  BP: 188/69 170/53  Pulse:  64  Temp:  98.2 F (36.8 C)  Resp:  18    Intake/Output Summary (Last 24 hours) at 03/16/15 1608 Last data filed at 03/16/15 1251  Gross per 24 hour  Intake 1081.25 ml  Output      0 ml  Net 1081.25 ml   Filed Weights   03/12/15 2223 03/13/15 0049  Weight: 58.968 kg (130 lb) 57.516 kg (126 lb 12.8 oz)    Exam: General: Alert and awake, oriented x3, not in any acute distress. HEENT: anicteric sclera, pupils reactive to light and accommodation, EOMI CVS: S1-S2 clear, no murmur rubs or gallops Chest: clear to auscultation bilaterally, no wheezing, rales or rhonchi Abdomen: soft nontender, nondistended, normal bowel sounds, no organomegaly Extremities: no cyanosis, clubbing or edema noted bilaterally Neuro: Cranial nerves II-XII intact, no focal neurological deficits  Data Reviewed: Basic Metabolic Panel:  Recent Labs Lab 03/12/15 1815 03/13/15 0436 03/14/15 0437 03/15/15 0444 03/16/15 0540  NA 139 138 139 139 138  K 3.6 3.6 3.8 3.4* 3.4*  CL 104 105 108 109 106  CO2 $Re'27 28 27 26 27  'cGE$ GLUCOSE 106* 105* 121* 113* 96  BUN 28* 22* $Remov'15 10 7  'FCNKaH$ CREATININE 1.04* 0.95 1.04* 1.03* 0.82  CALCIUM 9.3 8.5* 8.3* 8.1* 8.4*   Liver Function Tests:  Recent Labs Lab 03/12/15 1815 03/13/15 0436  AST 23 21  ALT 14 14  ALKPHOS 55 52  BILITOT 0.4 0.4  PROT 8.3* 7.3  ALBUMIN 3.5 3.2*    Recent Labs Lab 03/12/15 1815  LIPASE 26   No results for  input(s): AMMONIA in the last 168 hours. CBC:  Recent Labs Lab 03/12/15 1815 03/13/15 0436 03/14/15 0437 03/15/15 0444  WBC 3.2* 3.6* 3.2* 3.6*  NEUTROABS 1.4* 1.5*  --   --   HGB 10.4* 9.9* 8.6* 8.4*  HCT 32.9* 31.5* 27.3* 26.5*  MCV 95.6 97.8 98.9 96.0  PLT 132* 121* 107* 102*   Cardiac Enzymes: No results for input(s): CKTOTAL, CKMB, CKMBINDEX,  TROPONINI in the last 168 hours. BNP (last 3 results)  Recent Labs  05/23/14 1445 08/13/14 1216 10/09/14 1524  BNP 537.1* 150.0* 537.4*    ProBNP (last 3 results) No results for input(s): PROBNP in the last 8760 hours.  CBG: No results for input(s): GLUCAP in the last 168 hours.  Micro No results found for this or any previous visit (from the past 240 hour(s)).   Studies: No results found.  Scheduled Meds: . acyclovir  400 mg Oral Daily  . amLODipine  5 mg Oral Daily  . antiseptic oral rinse  7 mL Mouth Rinse BID  . aspirin  325 mg Oral Daily  . ciprofloxacin  500 mg Oral BID  . enoxaparin (LOVENOX) injection  40 mg Subcutaneous Q24H  . ezetimibe  10 mg Oral QHS  . isosorbide mononitrate  30 mg Oral Daily  . metoprolol succinate  25 mg Oral Daily  . metroNIDAZOLE  500 mg Oral 3 times per day  . pantoprazole (PROTONIX) IV  40 mg Intravenous Q24H  . polyethylene glycol  17 g Oral Daily  . potassium chloride  60 mEq Oral Once   Continuous Infusions:       Time spent: 35 minutes    [facility] A  [facility] Pager [phone] If 7PM-7AM, please contact night-coverage at [email or web address], password [facility] 03/16/2015, 4:08 PM

## 2015-03-17 DIAGNOSIS — D630 Anemia in neoplastic disease: Secondary | ICD-10-CM | POA: Diagnosis not present

## 2015-03-17 DIAGNOSIS — I480 Paroxysmal atrial fibrillation: Secondary | ICD-10-CM | POA: Diagnosis not present

## 2015-03-17 DIAGNOSIS — K5732 Diverticulitis of large intestine without perforation or abscess without bleeding: Secondary | ICD-10-CM | POA: Diagnosis not present

## 2015-03-17 DIAGNOSIS — R112 Nausea with vomiting, unspecified: Secondary | ICD-10-CM | POA: Diagnosis not present

## 2015-03-17 LAB — BASIC METABOLIC PANEL
Anion gap: 5 (ref 5–15)
BUN: 10 mg/dL (ref 6–20)
CHLORIDE: 100 mmol/L — AB (ref 101–111)
CO2: 28 mmol/L (ref 22–32)
Calcium: 8.6 mg/dL — ABNORMAL LOW (ref 8.9–10.3)
Creatinine, Ser: 0.95 mg/dL (ref 0.44–1.00)
GFR calc Af Amer: >60 mL/min (ref >60)
GFR calc non Af Amer: 55 mL/min — ABNORMAL LOW (ref >60)
Glucose, Bld: 98 mg/dL (ref 65–99)
POTASSIUM: 3.4 mmol/L — AB (ref 3.5–5.1)
SODIUM: 133 mmol/L — AB (ref 135–145)

## 2015-03-17 LAB — MAGNESIUM: MAGNESIUM: 1.6 mg/dL — AB (ref 1.7–2.4)

## 2015-03-17 MED ORDER — POTASSIUM CHLORIDE CRYS ER 20 MEQ PO TBCR
40.0000 meq | EXTENDED_RELEASE_TABLET | Freq: Four times a day (QID) | ORAL | Status: AC
  Administered 2015-03-17 (×2): 40 meq via ORAL
  Filled 2015-03-17 (×2): qty 2

## 2015-03-17 MED ORDER — CIPROFLOXACIN HCL 500 MG PO TABS: 500.0000 mg | ORAL_TABLET | Freq: Two times a day (BID) | ORAL | Status: DC

## 2015-03-17 MED ORDER — MAGNESIUM SULFATE 2 GM/50ML IV SOLN
2.0000 g | Freq: Once | INTRAVENOUS | Status: AC
  Administered 2015-03-17: 2 g via INTRAVENOUS
  Filled 2015-03-17: qty 50

## 2015-03-17 MED ORDER — METRONIDAZOLE 500 MG PO TABS: 500.0000 mg | ORAL_TABLET | Freq: Three times a day (TID) | ORAL | Status: DC

## 2015-03-17 MED ORDER — AMLODIPINE BESYLATE 10 MG PO TABS: 10.0000 mg | ORAL_TABLET | Freq: Every day | ORAL | Status: AC

## 2015-03-17 NOTE — Discharge Summary (Signed)
Physician Discharge Summary  [name]:[id number] DOB: [birth year] DOA: 03/12/2015  PCP: [name], MD  Admit date: 03/12/2015 Discharge date: 03/17/2015  Time spent: 40 minutes  Recommendations for Outpatient Follow-up:  1. Follow-up with primary care physician within one week. 2. Follow-up with oncology on 03/23/2015. 3. Check BMP and magnesium the next office visit.   Discharge Diagnoses:  Principal Problem:   Diverticulitis Active Problems:   GERD (gastroesophageal reflux disease)   COPD (chronic obstructive pulmonary disease) (HCC)   Hypertension   CAD (coronary artery disease) of artery bypass graft, atretic LIMA   Protein-calorie malnutrition, severe (HCC)   Plasma cell disorder   Multiple myeloma (HCC)   Anemia in neoplastic disease   A-fib, CHA2DS2-VASc score 7   Vomiting   Discharge Condition: Stable  Diet recommendation: Heart healthy  Filed Weights   03/12/15 2223 03/13/15 0049  Weight: 58.968 kg (130 lb) 57.516 kg (126 lb 12.8 oz)    History of present illness:  - Patient is a 81 year old female with history of MM on chemotherapy, Afib and HTN. - She presented with cc of N/V for the past two days. She was not able to eat any meal or hold food/medications in her stomach. Vomitus in non bloody. No abdominal pain, diarrhea or constipation.  - She had a similar complaint before after chemotherapy but she was able to control it with oral antiemetic medications at home, unlike this time.  - She denies any other symptoms including: abdominal pain, fever,chills, chest pain, dyspnea, dysuria.  - No other complaints  Hospital Course:   Acute diverticulitis:  Patient presented to the hospital with nausea and vomiting and abdominal pain. CT scan showed suspected sigmoid diverticulitis. Patient was on Zosyn and switched to oral Cipro/Metro Diet advanced, tolerated very well, had slight nausea for one day. Tolerating diet well today, discharged home  with 5 more days of Cipro/Metro.  Nausea/vomiting:  Likely due to chemotherapy versus sigmoid diverticulitis.  Started on Zofran prn Advance diet as tolerated  Hypertensive urgency:  Likely due to not taking meds due to above Home medications restarted, still has high blood pressure added amlodipine. Continues to have high systolic pressure, Norvasc 10 mg added.  Afib:  Atrial fibrillation with a slow ventricular response, heart rate was in the 40s. Hold AV nodal blocking medications including metoprolol. CHA2DS2-VASc of 7 patient is on aspirin. Per patient she was on Xarelto and discontinued by her PCP because of blood in stools and urine.  Hypokalemia and hypomagnesemia Potassium repleted with oral supplements, magnesium with IV preparation. Needs BMP and magnesium to be checked as outpatient.  Procedures:  None  Consultations:  None  Discharge Exam: Filed Vitals:   03/16/15 2022 03/17/15 0435  BP: 175/47 161/75  Pulse: 56 62  Temp: 98.2 F (36.8 C) 98 F (36.7 C)  Resp: 18 16   General: Alert and awake, oriented x3, not in any acute distress. HEENT: anicteric sclera, pupils reactive to light and accommodation, EOMI CVS: S1-S2 clear, no murmur rubs or gallops Chest: clear to auscultation bilaterally, no wheezing, rales or rhonchi Abdomen: soft nontender, nondistended, normal bowel sounds, no organomegaly Extremities: no cyanosis, clubbing or edema noted bilaterally Neuro: Cranial nerves II-XII intact, no focal neurological deficits  Discharge Instructions   Discharge Instructions    Diet - low sodium heart healthy    Complete by:  As directed      Increase activity slowly    Complete by:  As directed  Current Discharge Medication List    START taking these medications   Details  amLODipine (NORVASC) 10 MG tablet Take 1 tablet (10 mg total) by mouth daily. Qty: 30 tablet, Refills: 0    ciprofloxacin (CIPRO) 500 MG tablet Take 1 tablet  (500 mg total) by mouth 2 (two) times daily. Qty: 10 tablet, Refills: 0    metroNIDAZOLE (FLAGYL) 500 MG tablet Take 1 tablet (500 mg total) by mouth every 8 (eight) hours. Qty: 15 tablet, Refills: 0      CONTINUE these medications which have NOT CHANGED   Details  acyclovir (ZOVIRAX) 400 MG tablet Take 1 tablet (400 mg total) by mouth daily. Qty: 30 tablet, Refills: 3    albuterol-ipratropium (COMBIVENT) 18-103 MCG/ACT inhaler Inhale 1-2 puffs into the lungs every 4 (four) hours as needed for wheezing or shortness of breath.    aspirin 325 MG tablet Take 325 mg by mouth daily.   Associated Diagnoses: Multiple myeloma (HCC)    cholecalciferol (VITAMIN D) 1000 UNITS tablet Take 1,000 Units by mouth every morning.     dexamethasone (DECADRON) 4 MG tablet Take 5 tablets every week before chemotherapy injection. Qty: 40 tablet, Refills: 3    ezetimibe (ZETIA) 10 MG tablet Take 1 tablet (10 mg total) by mouth at bedtime.    feeding supplement, ENSURE ENLIVE, (ENSURE ENLIVE) LIQD Take 237 mLs by mouth 3 (three) times daily between meals. Qty: 237 mL, Refills: 12    HYDROmorphone (DILAUDID) 4 MG tablet Take 1 tablet (4 mg total) by mouth every 4 (four) hours as needed for severe pain. Qty: 30 tablet, Refills: 0   Associated Diagnoses: Multiple myeloma in relapse (HCC)    isosorbide mononitrate (IMDUR) 30 MG 24 hr tablet Take 1 tablet (30 mg total) by mouth daily. Qty: 30 tablet, Refills: 3    Lidocaine 4 % PTCH Apply 1 patch topically daily as needed (pain).    metoprolol succinate (TOPROL-XL) 25 MG 24 hr tablet Take 1 tablet (25 mg total) by mouth daily. Qty: 30 tablet, Refills: 3    naproxen sodium (ANAPROX) 220 MG tablet Take 440-660 mg by mouth 3 (three) times daily as needed (pain).    nitroGLYCERIN (NITROSTAT) 0.4 MG SL tablet Place 0.4 mg under the tongue every 5 (five) minutes as needed for chest pain.    Omega-3 Fatty Acids (FISH OIL) 1200 MG CAPS Take 1,200-2,400 mg by  mouth 2 (two) times daily. Take 2 capsules (2400 mg) every morning and 1 capsule (1200 mg) every night    OVER THE COUNTER MEDICATION Take 2 tablets by mouth at bedtime as needed (restless legs). otc restless leg med    pantoprazole (PROTONIX) 40 MG tablet Take 1 tablet (40 mg total) by mouth 2 (two) times daily. Refills: 0    polyethylene glycol powder (GLYCOLAX/MIRALAX) powder Take 17 g by mouth every morning. Mix with 8 oz liquid and drink Refills: 0    PRESCRIPTION MEDICATION Chemo - [facility]    prochlorperazine (COMPAZINE) 10 MG tablet Take 1 tablet (10 mg total) by mouth every 8 (eight) hours as needed for nausea. Qty: 30 tablet, Refills: 0    sennosides-docusate sodium (SENOKOT-S) 8.6-50 MG tablet Take 2 tablets by mouth daily as needed for constipation.    tiotropium (SPIRIVA) 18 MCG inhalation capsule Place 18 mcg into inhaler and inhale daily as needed (shortness of breath).     acetaminophen (TYLENOL) 325 MG tablet Take 2 tablets (650 mg total) by mouth every 6 (six) hours as  needed for mild pain (or Fever >/= 101). Qty: 60 tablet, Refills: 0    oxyCODONE-acetaminophen (PERCOCET/ROXICET) 5-325 MG tablet Take 1-2 tablets by mouth every 6 (six) hours as needed for moderate pain or severe pain. Qty: 30 tablet, Refills: 0   Associated Diagnoses: Multiple myeloma not having achieved remission (HCC)       Allergies  Allergen Reactions  . Lexapro [Escitalopram Oxalate] Other (See Comments)    Fatigue   . Soma [Carisoprodol] Other (See Comments)    Fatigue   . Wellbutrin [Bupropion] Other (See Comments)    "couldn't function and take it"  . Albuterol Sulfate Other (See Comments)    Albuterol HFA inhaler caused nervousness   . Codeine Hives  . Levaquin [Levofloxacin In D5w] Other (See Comments)    Unknown allergic reaction  . Plavix [Clopidogrel Bisulfate] Other (See Comments)    Unknown reaction  . Statins     Unknown reaction    Follow-up Information    Follow up with  [name], MD In 1 week.   Specialty:  Internal Medicine   Contact information:   [address] [place] [address] [phone]        The results of significant diagnostics from this hospitalization (including imaging, microbiology, ancillary and laboratory) are listed below for reference.    Significant Diagnostic Studies: Dg Chest 2 View  02/20/2015  CLINICAL DATA:  Subacute onset of nausea and vomiting. Acute onset of diarrhea and fever. Initial encounter. EXAM: CHEST  2 VIEW COMPARISON:  Chest radiograph from 12/26/2014 FINDINGS: The lungs are well-aerated and clear. There is no evidence of focal opacification, pleural effusion or pneumothorax. The heart is borderline normal in size. The patient is status post median sternotomy, with evidence of prior CABG. No acute osseous abnormalities are seen. Clips are noted within the right upper quadrant, reflecting prior cholecystectomy. IMPRESSION: No acute cardiopulmonary process seen. Electronically Signed   By: Garald Balding M.D.   On: 02/20/2015 19:28   Ct Abdomen Pelvis W Contrast  03/12/2015  CLINICAL DATA:  Lower abdominal tenderness, nausea/ vomiting. EXAM: CT ABDOMEN AND PELVIS WITH CONTRAST TECHNIQUE: Multidetector CT imaging of the abdomen and pelvis was performed using the standard protocol following bolus administration of intravenous contrast. CONTRAST:  77mL OMNIPAQUE IOHEXOL 300 MG/ML  SOLN COMPARISON:  02/20/2015 FINDINGS: Lower chest:  Lung bases are essentially clear. Cardiomegaly. Hepatobiliary: Liver is within normal limits. No suspicious/enhancing hepatic lesions. Status post cholecystectomy. No intrahepatic ductal dilatation. Common duct is mildly prominent but tapers at the ampulla, likely postsurgical. Pancreas: Within normal limits. Spleen: Within normal limits. Adrenals/Urinary Tract: Adrenal glands are within normal limits. 1.5 cm cyst along the lateral left lower kidney (series 2/ image 44).  Additional 1.2 cm lateral left upper pole renal cyst (series 7/ image 13). Right kidney is unremarkable. No hydronephrosis. Bladder is within normal limits. Stomach/Bowel: Stomach is notable for a small hiatal hernia. No evidence of bowel obstruction. Prior appendectomy. When compared to the prior CT, the right colonic wall thickening is improved, although a portion of the ascending colon is underdistended. Extensive sigmoid diverticulosis, with mild wall thickening/inflammatory changes along the sigmoid colon in the left lower pelvis (series 2/image 34), suggesting sigmoid diverticulitis. Eccentric wall thickening along with the superior aspect of this loop of colon suggests intramural fluid without discrete abscess within the wall of the sigmoid colon (series 3/image 54). No drainable fluid collection/ abscess. No free air to suggest macroscopic perforation. Vascular/Lymphatic: Atherosclerotic calcifications of the abdominal  aorta and branch vessels. No evidence of abdominal aortic aneurysm. No suspicious abdominopelvic lymphadenopathy. Reproductive: Status post hysterectomy. No adnexal masses. Other: No abdominopelvic ascites. Musculoskeletal: Degenerative changes of the visualized thoracolumbar spine. IMPRESSION: Suspected sigmoid diverticulitis. No drainable fluid collection/abscess.  No free air. Electronically Signed   By: Julian Hy M.D.   On: 03/12/2015 21:19   Ct Abdomen Pelvis W Contrast  02/20/2015  CLINICAL DATA:  Acute onset of generalized abdominal pain, nausea, vomiting, diarrhea and fever. Patient on chemotherapy for multiple myeloma. Initial encounter. EXAM: CT ABDOMEN AND PELVIS WITH CONTRAST TECHNIQUE: Multidetector CT imaging of the abdomen and pelvis was performed using the standard protocol following bolus administration of intravenous contrast. CONTRAST:  129mL OMNIPAQUE IOHEXOL 300 MG/ML  SOLN COMPARISON:  CT of the abdomen and pelvis performed 05/20/2014 FINDINGS: The visualized  lung bases are clear. The patient is status post median sternotomy. Diffuse coronary artery calcifications are seen. The liver and spleen are unremarkable in appearance. The patient is status post cholecystectomy, with clips noted at the gallbladder fossa. The pancreas and adrenal glands are unremarkable. A few small bilateral renal cysts are noted. The kidneys are otherwise unremarkable. There is no evidence of hydronephrosis. No renal or ureteral stones are seen. No perinephric stranding is appreciated. No free fluid is identified. The small bowel is unremarkable in appearance. The stomach is within normal limits. No acute vascular abnormalities are seen. Diffuse calcification is noted along the abdominal aorta and its branches. Mild wall thickening is noted along the cecum and ascending colon, and there is question of mild wall thickening along the sigmoid colon with associated soft tissue inflammation, concerning for acute infectious or inflammatory colitis. The patient is status post appendectomy. The bladder is decompressed and not well assessed. The patient is status post hysterectomy. No suspicious adnexal masses are seen. No inguinal lymphadenopathy is seen. No acute osseous abnormalities are identified. There is minimal grade 1 anterolisthesis of L4 on L5, reflecting underlying facet disease. Intervertebral disc space narrowing is noted at L5-S1. IMPRESSION: 1. Mild wall thickening along the cecum and ascending colon, and question of mild wall thickening along the sigmoid colon, with associated soft tissue inflammation, concerning for acute infectious or inflammatory colitis. 2. Few small bilateral renal cysts noted. 3. Diffuse coronary artery calcifications seen. 4. Diffuse calcification along the abdominal aorta and its branches. Electronically Signed   By: Garald Balding M.D.   On: 02/20/2015 22:02    Microbiology: No results found for this or any previous visit (from the past 240 hour(s)).    Labs: Basic Metabolic Panel:  Recent Labs Lab 03/13/15 0436 03/14/15 0437 03/15/15 0444 03/16/15 0540 03/17/15 0519  NA 138 139 139 138 133*  K 3.6 3.8 3.4* 3.4* 3.4*  CL 105 108 109 106 100*  CO2 $Re'28 27 26 27 28  'rBM$ GLUCOSE 105* 121* 113* 96 98  BUN 22* $Remov'15 10 7 10  'mZBauG$ CREATININE 0.95 1.04* 1.03* 0.82 0.95  CALCIUM 8.5* 8.3* 8.1* 8.4* 8.6*  MG  --   --   --   --  1.6*   Liver Function Tests:  Recent Labs Lab 03/12/15 1815 03/13/15 0436  AST 23 21  ALT 14 14  ALKPHOS 55 52  BILITOT 0.4 0.4  PROT 8.3* 7.3  ALBUMIN 3.5 3.2*    Recent Labs Lab 03/12/15 1815  LIPASE 26   No results for input(s): AMMONIA in the last 168 hours. CBC:  Recent Labs Lab 03/12/15 1815 03/13/15 0436 03/14/15 0437 03/15/15 0444  WBC 3.2* 3.6* 3.2* 3.6*  NEUTROABS 1.4* 1.5*  --   --   HGB 10.4* 9.9* 8.6* 8.4*  HCT 32.9* 31.5* 27.3* 26.5*  MCV 95.6 97.8 98.9 96.0  PLT 132* 121* 107* 102*   Cardiac Enzymes: No results for input(s): CKTOTAL, CKMB, CKMBINDEX, TROPONINI in the last 168 hours. BNP: BNP (last 3 results)  Recent Labs  05/23/14 1445 08/13/14 1216 10/09/14 1524  BNP 537.1* 150.0* 537.4*    ProBNP (last 3 results) No results for input(s): PROBNP in the last 8760 hours.  CBG: No results for input(s): GLUCAP in the last 168 hours.     Signed:  Zackari Ruane A  [facility] 03/17/2015, 9:42 AM

## 2015-03-22 ENCOUNTER — Other Ambulatory Visit: Payer: Self-pay | Admitting: *Deleted

## 2015-03-22 DIAGNOSIS — C9000 Multiple myeloma not having achieved remission: Secondary | ICD-10-CM

## 2015-03-23 ENCOUNTER — Ambulatory Visit (HOSPITAL_BASED_OUTPATIENT_CLINIC_OR_DEPARTMENT_OTHER): Payer: Medicare Other | Admitting: Oncology

## 2015-03-23 ENCOUNTER — Telehealth: Payer: Self-pay | Admitting: Oncology

## 2015-03-23 ENCOUNTER — Ambulatory Visit (HOSPITAL_BASED_OUTPATIENT_CLINIC_OR_DEPARTMENT_OTHER): Payer: Medicare Other

## 2015-03-23 ENCOUNTER — Other Ambulatory Visit (HOSPITAL_BASED_OUTPATIENT_CLINIC_OR_DEPARTMENT_OTHER): Payer: Medicare Other

## 2015-03-23 VITALS — BP 121/47 | HR 50 | Temp 97.4°F | Resp 18 | Ht 62.0 in | Wt 127.9 lb

## 2015-03-23 VITALS — BP 116/43 | HR 46 | Temp 97.7°F | Resp 18

## 2015-03-23 DIAGNOSIS — G8929 Other chronic pain: Secondary | ICD-10-CM | POA: Diagnosis not present

## 2015-03-23 DIAGNOSIS — D630 Anemia in neoplastic disease: Secondary | ICD-10-CM

## 2015-03-23 DIAGNOSIS — C9002 Multiple myeloma in relapse: Secondary | ICD-10-CM

## 2015-03-23 DIAGNOSIS — D6959 Other secondary thrombocytopenia: Secondary | ICD-10-CM

## 2015-03-23 DIAGNOSIS — Z5112 Encounter for antineoplastic immunotherapy: Secondary | ICD-10-CM | POA: Diagnosis not present

## 2015-03-23 DIAGNOSIS — C9000 Multiple myeloma not having achieved remission: Secondary | ICD-10-CM

## 2015-03-23 DIAGNOSIS — M545 Low back pain: Secondary | ICD-10-CM | POA: Diagnosis not present

## 2015-03-23 DIAGNOSIS — R634 Abnormal weight loss: Secondary | ICD-10-CM

## 2015-03-23 LAB — CBC WITH DIFFERENTIAL/PLATELET
BASO%: 0.5 % (ref 0.0–2.0)
BASOS ABS: 0 10*3/uL (ref 0.0–0.1)
EOS%: 0.9 % (ref 0.0–7.0)
Eosinophils Absolute: 0 10*3/uL (ref 0.0–0.5)
HEMATOCRIT: 29.8 % — AB (ref 34.8–46.6)
HGB: 9.5 g/dL — ABNORMAL LOW (ref 11.6–15.9)
LYMPH#: 2 10*3/uL (ref 0.9–3.3)
LYMPH%: 37.8 % (ref 14.0–49.7)
MCH: 30.5 pg (ref 25.1–34.0)
MCHC: 31.7 g/dL (ref 31.5–36.0)
MCV: 96.2 fL (ref 79.5–101.0)
MONO#: 0.5 10*3/uL (ref 0.1–0.9)
MONO%: 10.2 % (ref 0.0–14.0)
NEUT#: 2.6 10*3/uL (ref 1.5–6.5)
NEUT%: 50.6 % (ref 38.4–76.8)
Platelets: 228 10*3/uL (ref 145–400)
RBC: 3.1 10*6/uL — ABNORMAL LOW (ref 3.70–5.45)
RDW: 17.9 % — ABNORMAL HIGH (ref 11.2–14.5)
WBC: 5.2 10*3/uL (ref 3.9–10.3)

## 2015-03-23 LAB — COMPREHENSIVE METABOLIC PANEL
ALT: <9 U/L (ref 0–55)
ANION GAP: 11 meq/L (ref 3–11)
AST: 22 U/L (ref 5–34)
Albumin: 3.6 g/dL (ref 3.5–5.0)
Alkaline Phosphatase: 58 U/L (ref 40–150)
BUN: 21.7 mg/dL (ref 7.0–26.0)
CALCIUM: 9.4 mg/dL (ref 8.4–10.4)
CHLORIDE: 105 meq/L (ref 98–109)
CO2: 21 mEq/L — ABNORMAL LOW (ref 22–29)
Creatinine: 1.8 mg/dL — ABNORMAL HIGH (ref 0.6–1.1)
EGFR: 25 mL/min/{1.73_m2} — ABNORMAL LOW (ref >90)
Glucose: 110 mg/dl (ref 70–140)
POTASSIUM: 3.9 meq/L (ref 3.5–5.1)
Sodium: 137 mEq/L (ref 136–145)
Total Bilirubin: <0.3 mg/dL (ref 0.20–1.20)
Total Protein: 9.7 g/dL — ABNORMAL HIGH (ref 6.4–8.3)

## 2015-03-23 MED ORDER — HYDROMORPHONE HCL 4 MG PO TABS: 4.0000 mg | ORAL_TABLET | ORAL | Status: DC | PRN

## 2015-03-23 MED ORDER — ONDANSETRON HCL 8 MG PO TABS
ORAL_TABLET | ORAL | Status: AC
  Filled 2015-03-23: qty 1

## 2015-03-23 MED ORDER — PROCHLORPERAZINE MALEATE 10 MG PO TABS
ORAL_TABLET | ORAL | Status: AC
  Filled 2015-03-23: qty 1

## 2015-03-23 MED ORDER — ONDANSETRON HCL 8 MG PO TABS
8.0000 mg | ORAL_TABLET | Freq: Once | ORAL | Status: AC
  Administered 2015-03-23: 8 mg via ORAL

## 2015-03-23 MED ORDER — MEGESTROL ACETATE 40 MG/ML PO SUSP: 400.0000 mg | Freq: Two times a day (BID) | ORAL | Status: AC

## 2015-03-23 MED ORDER — BORTEZOMIB CHEMO SQ INJECTION 3.5 MG (2.5MG/ML)
1.3000 mg/m2 | Freq: Once | INTRAMUSCULAR | Status: AC
Start: 2015-03-23 — End: 2015-03-23
  Administered 2015-03-23: 2 mg via SUBCUTANEOUS
  Filled 2015-03-23: qty 2

## 2015-03-23 NOTE — Telephone Encounter (Signed)
Gave patient avs report and appointments for December and January  °

## 2015-03-23 NOTE — Patient Instructions (Signed)
[facility] Discharge Instructions for Patients Receiving Chemotherapy  Today you received the following chemotherapy agents Velcade  To help prevent nausea and vomiting after your treatment, we encourage you to take your nausea medication    If you develop nausea and vomiting that is not controlled by your nausea medication, call the clinic.   BELOW ARE SYMPTOMS THAT SHOULD BE REPORTED IMMEDIATELY:  *FEVER GREATER THAN 100.5 F  *CHILLS WITH OR WITHOUT FEVER  NAUSEA AND VOMITING THAT IS NOT CONTROLLED WITH YOUR NAUSEA MEDICATION  *UNUSUAL SHORTNESS OF BREATH  *UNUSUAL BRUISING OR BLEEDING  TENDERNESS IN MOUTH AND THROAT WITH OR WITHOUT PRESENCE OF ULCERS  *URINARY PROBLEMS  *BOWEL PROBLEMS  UNUSUAL RASH Items with * indicate a potential emergency and should be followed up as soon as possible.  Feel free to call the clinic you have any questions or concerns. The clinic phone number is [phone].  Please show the CHEMO ALERT CARD at check-in to the Emergency Department and triage nurse.   

## 2015-03-23 NOTE — Progress Notes (Signed)
Hematology and Oncology Follow Up Visit  SARAPHINA LAUDERBAUGH [id number] 1931-04-15 81 y.o. 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 She was found to have IgG level of 3430 and an M spike of 2.9 g/dL on 07/20/2014. Bone marrow biopsy on 08/30/2014 showed 78% plasma cell dyscrasia.   Prior Therapy: Status post packed red cell transfusions during hospitalization in February 2016. This was repeated in May 2016.  She is status post a bone marrow biopsy on 08/30/2014.  Current therapy:  Systemic therapy with Velcade and dexamethasone weekly regimen starting on 09/22/2014.   Interim History: Ms. Kuna presents today for a follow-up visit accompanied by her daughter. Since the last visit, she was hospitalized briefly for an episode of diverticulitis which have resolved at this time. She presented with symptoms of anorexia, diarrhea and abdominal pain. She was treated with antibiotics and was discharged from the hospital in the last week and have been recovering slowly. She is no longer reporting any nausea or vomiting and has not reported any abdominal pain. Her appetite is still rather poor and of lost close to 10 pounds.  She continues to tolerat  treatment without any complications. She did miss Velcade treatment last week because of her hospitalization. She does not report any peripheral neuropathy, constipation or abdominal pain. She does not report any injection-related complications associated with Velcade. She does not report any insomnia or anxiety associated with dexamethasone.  She does not report any headaches, blurry vision, syncope or seizures. She does not report any fevers, chills, sweats. She does not report any chest pain, palpitation orthopnea. Does not report any cough, hemoptysis or hematemesis. She does report occasional nausea but no vomiting no hematochezia or melena. She does  not report any frequency urgency or hesitancy. She does report chronic back pain which is unchanged. Remaining review of systems unremarkable.   Medications: I have reviewed the patient's current medications.  Current Outpatient Prescriptions  Medication Sig Dispense Refill  . acetaminophen (TYLENOL) 325 MG tablet Take 2 tablets (650 mg total) by mouth every 6 (six) hours as needed for mild pain (or Fever >/= 101). 60 tablet 0  . acyclovir (ZOVIRAX) 400 MG tablet Take 1 tablet (400 mg total) by mouth daily. 30 tablet 3  . albuterol-ipratropium (COMBIVENT) 18-103 MCG/ACT inhaler Inhale 1-2 puffs into the lungs every 4 (four) hours as needed for wheezing or shortness of breath.    [name] amLODipine (NORVASC) 10 MG tablet Take 1 tablet (10 mg total) by mouth daily. 30 tablet 0  . aspirin 325 MG tablet Take 325 mg by mouth daily.    . cholecalciferol (VITAMIN D) 1000 UNITS tablet Take 1,000 Units by mouth every morning.     . ciprofloxacin (CIPRO) 500 MG tablet Take 1 tablet (500 mg total) by mouth 2 (two) times daily. 10 tablet 0  . dexamethasone (DECADRON) 4 MG tablet Take 5 tablets every week before chemotherapy injection. 40 tablet 3  . ezetimibe (ZETIA) 10 MG tablet Take 1 tablet (10 mg total) by mouth at bedtime.    . feeding supplement, ENSURE ENLIVE, (ENSURE ENLIVE) LIQD Take 237 mLs by mouth 3 (three) times daily between meals. 237 mL 12  . HYDROmorphone (DILAUDID) 4 MG tablet Take 1 tablet (4 mg total) by mouth every 4 (four) hours as needed for severe pain. 30 tablet 0  . isosorbide mononitrate (IMDUR) 30 MG 24 hr tablet Take  1 tablet (30 mg total) by mouth daily. 30 tablet 3  . Lidocaine 4 % PTCH Apply 1 patch topically daily as needed (pain).    . megestrol (MEGACE ORAL) 40 MG/ML suspension Take 10 mLs (400 mg total) by mouth 2 (two) times daily. 240 mL 0  . metoprolol succinate (TOPROL-XL) 25 MG 24 hr tablet Take 1 tablet (25 mg total) by mouth daily. 30 tablet 3  . metroNIDAZOLE (FLAGYL)  500 MG tablet Take 1 tablet (500 mg total) by mouth every 8 (eight) hours. 15 tablet 0  . naproxen sodium (ANAPROX) 220 MG tablet Take 440-660 mg by mouth 3 (three) times daily as needed (pain).    . nitroGLYCERIN (NITROSTAT) 0.4 MG SL tablet Place 0.4 mg under the tongue every 5 (five) minutes as needed for chest pain.    . Omega-3 Fatty Acids (FISH OIL) 1200 MG CAPS Take 1,200-2,400 mg by mouth 2 (two) times daily. Take 2 capsules (2400 mg) every morning and 1 capsule (1200 mg) every night    . OVER THE COUNTER MEDICATION Take 2 tablets by mouth at bedtime as needed (restless legs). otc restless leg med    . oxyCODONE-acetaminophen (PERCOCET/ROXICET) 5-325 MG tablet Take 1-2 tablets by mouth every 6 (six) hours as needed for moderate pain or severe pain. 30 tablet 0  . pantoprazole (PROTONIX) 40 MG tablet Take 1 tablet (40 mg total) by mouth 2 (two) times daily.  0  . polyethylene glycol powder (GLYCOLAX/MIRALAX) powder Take 17 g by mouth every morning. Mix with 8 oz liquid and drink  0  . PRESCRIPTION MEDICATION Chemo - [facility]    . prochlorperazine (COMPAZINE) 10 MG tablet Take 1 tablet (10 mg total) by mouth every 8 (eight) hours as needed for nausea. 30 tablet 0  . sennosides-docusate sodium (SENOKOT-S) 8.6-50 MG tablet Take 2 tablets by mouth daily as needed for constipation.    [name] tiotropium (SPIRIVA) 18 MCG inhalation capsule Place 18 mcg into inhaler and inhale daily as needed (shortness of breath).      No current facility-administered medications for this visit.     Allergies:  Allergies  Allergen Reactions  . Lexapro [Escitalopram Oxalate] Other (See Comments)    Fatigue   . Soma [Carisoprodol] Other (See Comments)    Fatigue   . Wellbutrin [Bupropion] Other (See Comments)    "couldn't function and take it"  . Albuterol Sulfate Other (See Comments)    Albuterol HFA inhaler caused nervousness   . Codeine Hives  . Levaquin [Levofloxacin In D5w] Other (See Comments)    Unknown  allergic reaction  . Plavix [Clopidogrel Bisulfate] Other (See Comments)    Unknown reaction  . Statins     Unknown reaction     Past Medical History, Surgical history, Social history, and Family History were reviewed and updated.   Physical Exam: Blood pressure 121/47, pulse 50, temperature 97.4 F (36.3 C), temperature source Oral, resp. rate 18, height 5\' 2"  (1.575 m), weight 127 lb 14.4 oz (58.015 kg), SpO2 97 %. ECOG: 1 General appearance: alert and cooperative. Chronically ill-appearing woman without distress. Head: Normocephalic, without obvious abnormality no oral thrush. Neck: no adenopathy Lymph nodes: Cervical, supraclavicular, and axillary nodes normal. Heart:regular rate and rhythm, S1, S2 normal, no murmur, click, rub or gallop Lung:chest clear, no wheezing, rales, normal symmetric air entry.  Abdomin: soft, non-tender, without masses or organomegaly no shifting dullness or ascites. EXT:no erythema, induration, or nodules   Lab Results: Lab Results  Component Value  Date   WBC 5.2 03/23/2015   HGB 9.5* 03/23/2015   HCT 29.8* 03/23/2015   MCV 96.2 03/23/2015   PLT 228 03/23/2015     Chemistry      Component Value Date/Time   NA 133* 03/17/2015 0519   NA 139 03/09/2015 1532   K 3.4* 03/17/2015 0519   K 3.8 03/09/2015 1532   CL 100* 03/17/2015 0519   CO2 28 03/17/2015 0519   CO2 25 03/09/2015 1532   BUN 10 03/17/2015 0519   BUN 22.7 03/09/2015 1532   CREATININE 0.95 03/17/2015 0519   CREATININE 1.4* 03/09/2015 1532      Component Value Date/Time   CALCIUM 8.6* 03/17/2015 0519   CALCIUM 9.6 03/09/2015 1532   ALKPHOS 52 03/13/2015 0436   ALKPHOS 61 03/09/2015 1532   AST 21 03/13/2015 0436   AST 23 03/09/2015 1532   ALT 14 03/13/2015 0436   ALT 13 03/09/2015 1532   BILITOT 0.4 03/13/2015 0436   BILITOT 0.43 03/09/2015 1532      Results for PAYZLEE, RYDER (MRN [id number]) as of 03/23/2015 14:40  Ref. Range 11/03/2014 15:36 12/29/2014 15:50  02/02/2015 15:46 03/09/2015 15:04  IgG (Immunoglobin G), Serum Latest Ref Range: [phone] mg/dL 2520 (H) 2290 (H) 2980 (H) 3140 (H)     Impression and Plan:  81 year old woman with the following issues:  1. IgG kappa plasma cell disorder with 78% plasma cell infiltration in the bone marrow. She is quite symptomatic and certainly requires treatment. She meets the criteria of multiple myeloma given her anemia and overall symptomatology.  She is currently receiving Velcade and dexamethasone. Protein studies after 4 months of therapy, she had a reasonable response her protein studies with IgG level down from 3600 to 2200.  Her most recent protein studies on 03/09/2015 shows slight increase in her IgG level but drop in her kappa free light chain. The plan is to continue on Velcade and dexamethasone for 1 more month and possibly switch her to Revlimid salvage therapy in January 2017.  2. Anemia: Related to plasma cell disorder. Her hemoglobin is stable at this time. No need for any transfusion.  3. Weight loss: Appetite is worse at this time and have lost more weight. I gave her a prescription for Megace and attempt to boost her appetite.  4. Thrombocytopenia: Related to Velcade no bleeding noted. Her platelet count has normalized at this time.  5. VZV prophylaxis: She continues to be on acyclovir.  6. Pain: His currently on the large which have helped her symptoms at this time and I refilled that for her today.  7. Follow-up: Will be on a weekly basis for her injection. Clinical evaluation in 4 weeks.     Zola Button, MD 12/16/20164:01 PM

## 2015-03-27 ENCOUNTER — Encounter: Payer: Self-pay | Admitting: *Deleted

## 2015-03-27 ENCOUNTER — Other Ambulatory Visit: Payer: Self-pay | Admitting: *Deleted

## 2015-03-27 DIAGNOSIS — C9000 Multiple myeloma not having achieved remission: Secondary | ICD-10-CM

## 2015-03-30 ENCOUNTER — Ambulatory Visit (HOSPITAL_BASED_OUTPATIENT_CLINIC_OR_DEPARTMENT_OTHER): Payer: Medicare Other

## 2015-03-30 ENCOUNTER — Other Ambulatory Visit (HOSPITAL_BASED_OUTPATIENT_CLINIC_OR_DEPARTMENT_OTHER): Payer: Medicare Other

## 2015-03-30 VITALS — BP 165/71 | HR 59 | Temp 98.7°F | Resp 18

## 2015-03-30 DIAGNOSIS — C9000 Multiple myeloma not having achieved remission: Secondary | ICD-10-CM | POA: Diagnosis not present

## 2015-03-30 DIAGNOSIS — Z5112 Encounter for antineoplastic immunotherapy: Secondary | ICD-10-CM | POA: Diagnosis not present

## 2015-03-30 DIAGNOSIS — C9002 Multiple myeloma in relapse: Secondary | ICD-10-CM

## 2015-03-30 LAB — CBC WITH DIFFERENTIAL/PLATELET
BASO%: 0 % (ref 0.0–2.0)
Basophils Absolute: 0 10*3/uL (ref 0.0–0.1)
EOS ABS: 0 10*3/uL (ref 0.0–0.5)
EOS%: 0.5 % (ref 0.0–7.0)
HEMATOCRIT: 30.3 % — AB (ref 34.8–46.6)
HEMOGLOBIN: 9.7 g/dL — AB (ref 11.6–15.9)
LYMPH%: 20.2 % (ref 14.0–49.7)
MCH: 30.6 pg (ref 25.1–34.0)
MCHC: 32 g/dL (ref 31.5–36.0)
MCV: 95.6 fL (ref 79.5–101.0)
MONO#: 0.2 10*3/uL (ref 0.1–0.9)
MONO%: 3.5 % (ref 0.0–14.0)
NEUT%: 75.8 % (ref 38.4–76.8)
NEUTROS ABS: 3.3 10*3/uL (ref 1.5–6.5)
Platelets: 169 10*3/uL (ref 145–400)
RBC: 3.17 10*6/uL — ABNORMAL LOW (ref 3.70–5.45)
RDW: 16.5 % — ABNORMAL HIGH (ref 11.2–14.5)
WBC: 4.3 10*3/uL (ref 3.9–10.3)
lymph#: 0.9 10*3/uL (ref 0.9–3.3)

## 2015-03-30 LAB — COMPREHENSIVE METABOLIC PANEL
ALBUMIN: 3.4 g/dL — AB (ref 3.5–5.0)
ALK PHOS: 62 U/L (ref 40–150)
ALT: 9 U/L (ref 0–55)
AST: 24 U/L (ref 5–34)
Anion Gap: 11 mEq/L (ref 3–11)
BILIRUBIN TOTAL: 0.4 mg/dL (ref 0.20–1.20)
BUN: 23.1 mg/dL (ref 7.0–26.0)
CO2: 23 meq/L (ref 22–29)
Calcium: 9.6 mg/dL (ref 8.4–10.4)
Chloride: 102 mEq/L (ref 98–109)
Creatinine: 1.3 mg/dL — ABNORMAL HIGH (ref 0.6–1.1)
EGFR: 38 mL/min/{1.73_m2} — ABNORMAL LOW (ref >90)
GLUCOSE: 167 mg/dL — AB (ref 70–140)
POTASSIUM: 4.3 meq/L (ref 3.5–5.1)
SODIUM: 136 meq/L (ref 136–145)
TOTAL PROTEIN: 10.1 g/dL — AB (ref 6.4–8.3)

## 2015-03-30 MED ORDER — ONDANSETRON HCL 8 MG PO TABS
ORAL_TABLET | ORAL | Status: AC
  Filled 2015-03-30: qty 1

## 2015-03-30 MED ORDER — BORTEZOMIB CHEMO SQ INJECTION 3.5 MG (2.5MG/ML)
1.3000 mg/m2 | Freq: Once | INTRAMUSCULAR | Status: AC
  Administered 2015-03-30: 2 mg via SUBCUTANEOUS
  Filled 2015-03-30: qty 0.8

## 2015-03-30 MED ORDER — ONDANSETRON HCL 8 MG PO TABS
8.0000 mg | ORAL_TABLET | Freq: Once | ORAL | Status: AC
  Administered 2015-03-30: 8 mg via ORAL

## 2015-03-30 NOTE — Patient Instructions (Signed)
[facility] Discharge Instructions for Patients Receiving Chemotherapy  Today you received the following chemotherapy agents: Velcade.  To help prevent nausea and vomiting after your treatment, we encourage you to take your nausea medication:  Compazine every 6 hours as needed.   If you develop nausea and vomiting that is not controlled by your nausea medication, call the clinic.   BELOW ARE SYMPTOMS THAT SHOULD BE REPORTED IMMEDIATELY:  *FEVER GREATER THAN 100.5 F  *CHILLS WITH OR WITHOUT FEVER  NAUSEA AND VOMITING THAT IS NOT CONTROLLED WITH YOUR NAUSEA MEDICATION  *UNUSUAL SHORTNESS OF BREATH  *UNUSUAL BRUISING OR BLEEDING  TENDERNESS IN MOUTH AND THROAT WITH OR WITHOUT PRESENCE OF ULCERS  *URINARY PROBLEMS  *BOWEL PROBLEMS  UNUSUAL RASH Items with * indicate a potential emergency and should be followed up as soon as possible.  Feel free to call the clinic you have any questions or concerns. The clinic phone number is [phone].  Please show the [place] at check-in to the Emergency Department and triage nurse.

## 2015-04-06 ENCOUNTER — Other Ambulatory Visit: Payer: Medicare Other

## 2015-04-06 ENCOUNTER — Ambulatory Visit: Payer: Medicare Other

## 2015-04-13 ENCOUNTER — Other Ambulatory Visit: Payer: Medicare Other

## 2015-04-13 ENCOUNTER — Ambulatory Visit: Payer: Medicare Other

## 2015-04-13 ENCOUNTER — Encounter: Payer: Self-pay | Admitting: *Deleted

## 2015-04-19 ENCOUNTER — Other Ambulatory Visit: Payer: Self-pay | Admitting: *Deleted

## 2015-04-19 DIAGNOSIS — C9000 Multiple myeloma not having achieved remission: Secondary | ICD-10-CM

## 2015-04-20 ENCOUNTER — Ambulatory Visit: Payer: Medicare Other

## 2015-04-20 ENCOUNTER — Other Ambulatory Visit (HOSPITAL_BASED_OUTPATIENT_CLINIC_OR_DEPARTMENT_OTHER): Payer: Medicare Other

## 2015-04-20 ENCOUNTER — Telehealth: Payer: Self-pay | Admitting: Oncology

## 2015-04-20 ENCOUNTER — Ambulatory Visit (HOSPITAL_BASED_OUTPATIENT_CLINIC_OR_DEPARTMENT_OTHER): Payer: Medicare Other | Admitting: Oncology

## 2015-04-20 ENCOUNTER — Other Ambulatory Visit: Payer: Self-pay | Admitting: *Deleted

## 2015-04-20 VITALS — BP 119/55 | HR 80 | Temp 97.7°F | Resp 18 | Ht 62.0 in | Wt 127.7 lb

## 2015-04-20 DIAGNOSIS — G8929 Other chronic pain: Secondary | ICD-10-CM | POA: Diagnosis not present

## 2015-04-20 DIAGNOSIS — C9002 Multiple myeloma in relapse: Secondary | ICD-10-CM

## 2015-04-20 DIAGNOSIS — C9000 Multiple myeloma not having achieved remission: Secondary | ICD-10-CM

## 2015-04-20 DIAGNOSIS — D696 Thrombocytopenia, unspecified: Secondary | ICD-10-CM | POA: Diagnosis not present

## 2015-04-20 DIAGNOSIS — D649 Anemia, unspecified: Secondary | ICD-10-CM | POA: Diagnosis not present

## 2015-04-20 DIAGNOSIS — M545 Low back pain: Secondary | ICD-10-CM

## 2015-04-20 DIAGNOSIS — R0609 Other forms of dyspnea: Secondary | ICD-10-CM

## 2015-04-20 LAB — CBC WITH DIFFERENTIAL/PLATELET
BASO%: 0.5 % (ref 0.0–2.0)
BASOS ABS: 0 10*3/uL (ref 0.0–0.1)
EOS ABS: 0 10*3/uL (ref 0.0–0.5)
EOS%: 0.5 % (ref 0.0–7.0)
HCT: 23.8 % — ABNORMAL LOW (ref 34.8–46.6)
HGB: 7.8 g/dL — ABNORMAL LOW (ref 11.6–15.9)
LYMPH#: 1.6 10*3/uL (ref 0.9–3.3)
LYMPH%: 38.8 % (ref 14.0–49.7)
MCH: 30.4 pg (ref 25.1–34.0)
MCHC: 32.6 g/dL (ref 31.5–36.0)
MCV: 93.1 fL (ref 79.5–101.0)
MONO#: 0.5 10*3/uL (ref 0.1–0.9)
MONO%: 13.6 % (ref 0.0–14.0)
NEUT#: 1.9 10*3/uL (ref 1.5–6.5)
NEUT%: 46.6 % (ref 38.4–76.8)
PLATELETS: 158 10*3/uL (ref 145–400)
RBC: 2.56 10*6/uL — ABNORMAL LOW (ref 3.70–5.45)
RDW: 18.2 % — ABNORMAL HIGH (ref 11.2–14.5)
WBC: 4 10*3/uL (ref 3.9–10.3)

## 2015-04-20 LAB — COMPREHENSIVE METABOLIC PANEL
ANION GAP: 8 meq/L (ref 3–11)
AST: 18 U/L (ref 5–34)
Albumin: 3.1 g/dL — ABNORMAL LOW (ref 3.5–5.0)
Alkaline Phosphatase: 68 U/L (ref 40–150)
BUN: 22 mg/dL (ref 7.0–26.0)
CALCIUM: 9.3 mg/dL (ref 8.4–10.4)
CHLORIDE: 108 meq/L (ref 98–109)
CO2: 22 mEq/L (ref 22–29)
CREATININE: 1.4 mg/dL — AB (ref 0.6–1.1)
EGFR: 34 mL/min/{1.73_m2} — AB (ref >90)
Glucose: 136 mg/dl (ref 70–140)
Potassium: 4.4 mEq/L (ref 3.5–5.1)
Sodium: 138 mEq/L (ref 136–145)
Total Bilirubin: <0.3 mg/dL (ref 0.20–1.20)
Total Protein: 9.9 g/dL — ABNORMAL HIGH (ref 6.4–8.3)

## 2015-04-20 MED ORDER — ONDANSETRON HCL 8 MG PO TABS
ORAL_TABLET | ORAL | Status: AC
  Filled 2015-04-20: qty 1

## 2015-04-20 MED ORDER — LENALIDOMIDE 10 MG PO CAPS: 10.0000 mg | ORAL_CAPSULE | Freq: Every day | ORAL | Status: DC

## 2015-04-20 NOTE — Telephone Encounter (Signed)
Gave patient avs report and appointments for January and February  °

## 2015-04-20 NOTE — Progress Notes (Signed)
Hematology and Oncology Follow Up Visit  [name] [id number] 10/14/1931 82 y.o. 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 She was found to have IgG level of 3430 and an M spike of 2.9 g/dL on 07/20/2014. Bone marrow biopsy on 08/30/2014 showed 78% plasma cell dyscrasia.   Prior Therapy:  Status post packed red cell transfusions during hospitalization in February 2016. This was repeated in May 2016.  She is status post a bone marrow biopsy on 08/30/2014. Systemic therapy with Velcade and dexamethasone weekly regimen starting on 09/22/2014. Therapy discontinued in 04/2015 due to progression of disease   Current therapy:  Revlimid 10 mg daily for 21 days with dexamethasone to start in January 2017.   Interim History: Ms. Bean presents today for a follow-up visit accompanied by her daughter. Since the last visit, she reports that she has improved slightly from her recent hospitalization in December 2016. She is able to eat better and have gained more weight. Although she still reporting more fatigue and tiredness and slight dyspnea on exertion.She is no longer reporting any nausea or vomiting and has not reported any abdominal pain.   She tolerated Velcade therapy fairly well but have not received it in the last 2 weeks. Her mobility is rather slow does not report any fall or syncope. She does report diffuse arthralgias predominantly in her right hip.  She does not report any headaches, blurry vision, syncope or seizures. She does not report any fevers, chills, sweats. She does not report any chest pain, palpitation orthopnea. Does not report any cough, hemoptysis or hematemesis. She does report occasional nausea but no vomiting no hematochezia or melena. She does not report any frequency urgency or hesitancy. She does report chronic back pain which is unchanged. Remaining review of  systems unremarkable.   Medications: I have reviewed the patient's current medications.  Current Outpatient Prescriptions  Medication Sig Dispense Refill  . acetaminophen (TYLENOL) 325 MG tablet Take 2 tablets (650 mg total) by mouth every 6 (six) hours as needed for mild pain (or Fever >/= 101). 60 tablet 0  . acyclovir (ZOVIRAX) 400 MG tablet Take 1 tablet (400 mg total) by mouth daily. 30 tablet 3  . albuterol-ipratropium (COMBIVENT) 18-103 MCG/ACT inhaler Inhale 1-2 puffs into the lungs every 4 (four) hours as needed for wheezing or shortness of breath.    [name] amLODipine (NORVASC) 10 MG tablet Take 1 tablet (10 mg total) by mouth daily. 30 tablet 0  . aspirin 325 MG tablet Take 325 mg by mouth daily.    . cholecalciferol (VITAMIN D) 1000 UNITS tablet Take 1,000 Units by mouth every morning.     . clonazePAM (KLONOPIN) 0.5 MG tablet Take 0.5 mg by mouth.    . dexamethasone (DECADRON) 4 MG tablet Take 5 tablets every week before chemotherapy injection. 40 tablet 3  . ezetimibe (ZETIA) 10 MG tablet Take 1 tablet (10 mg total) by mouth at bedtime.    . feeding supplement, ENSURE ENLIVE, (ENSURE ENLIVE) LIQD Take 237 mLs by mouth 3 (three) times daily between meals. 237 mL 12  . HYDROmorphone (DILAUDID) 4 MG tablet Take 1 tablet (4 mg total) by mouth every 4 (four) hours as needed for severe pain. 30 tablet 0  . isosorbide mononitrate (IMDUR) 30 MG 24 hr tablet Take 1 tablet (30 mg total) by mouth daily. 30 tablet 3  . Lidocaine 4 % PTCH  Apply 1 patch topically daily as needed (pain).    . megestrol (MEGACE ORAL) 40 MG/ML suspension Take 10 mLs (400 mg total) by mouth 2 (two) times daily. 240 mL 0  . metoprolol succinate (TOPROL-XL) 25 MG 24 hr tablet Take 1 tablet (25 mg total) by mouth daily. 30 tablet 3  . metroNIDAZOLE (FLAGYL) 500 MG tablet Take 1 tablet (500 mg total) by mouth every 8 (eight) hours. 15 tablet 0  . naproxen sodium (ANAPROX) 220 MG tablet Take 440-660 mg by mouth 3 (three)  times daily as needed (pain).    [name] OVER THE COUNTER MEDICATION Take 2 tablets by mouth at bedtime as needed (restless legs). otc restless leg med    . oxyCODONE-acetaminophen (PERCOCET/ROXICET) 5-325 MG tablet Take 1-2 tablets by mouth every 6 (six) hours as needed for moderate pain or severe pain. 30 tablet 0  . pantoprazole (PROTONIX) 40 MG tablet Take 1 tablet (40 mg total) by mouth 2 (two) times daily.  0  . polyethylene glycol powder (GLYCOLAX/MIRALAX) powder Take 17 g by mouth every morning. Mix with 8 oz liquid and drink  0  . PRESCRIPTION MEDICATION Chemo - [facility]    . prochlorperazine (COMPAZINE) 10 MG tablet Take 1 tablet (10 mg total) by mouth every 8 (eight) hours as needed for nausea. 30 tablet 0  . sennosides-docusate sodium (SENOKOT-S) 8.6-50 MG tablet Take 2 tablets by mouth daily as needed for constipation.    [name] tiotropium (SPIRIVA) 18 MCG inhalation capsule Place 18 mcg into inhaler and inhale daily as needed (shortness of breath).     . traMADol (ULTRAM) 50 MG tablet Take 50 mg by mouth as directed.    [name] lenalidomide (REVLIMID) 10 MG capsule Take 1 capsule (10 mg total) by mouth daily. 21 capsule 0  . nitroGLYCERIN (NITROSTAT) 0.4 MG SL tablet Place 0.4 mg under the tongue every 5 (five) minutes as needed for chest pain. Reported on 04/20/2015    . Omega-3 Fatty Acids (FISH OIL) 1200 MG CAPS Take 1,200-2,400 mg by mouth 2 (two) times daily. Reported on 04/20/2015     No current facility-administered medications for this visit.     Allergies:  Allergies  Allergen Reactions  . Lexapro [Escitalopram Oxalate] Other (See Comments)    Fatigue   . Soma [Carisoprodol] Other (See Comments)    Fatigue   . Wellbutrin [Bupropion] Other (See Comments)    "couldn't function and take it"  . Albuterol Sulfate Other (See Comments)    Albuterol HFA inhaler caused nervousness   . Codeine Hives  . Levaquin [Levofloxacin In D5w] Other (See Comments)    Unknown allergic reaction  . Plavix  [Clopidogrel Bisulfate] Other (See Comments)    Unknown reaction  . Statins     Unknown reaction     Past Medical History, Surgical history, Social history, and Family History were reviewed and updated.   Physical Exam: Blood pressure 119/55, pulse 80, temperature 97.7 F (36.5 C), temperature source Oral, resp. rate 18, height '5\' 2"'$  (1.575 m), weight 127 lb 11.2 oz (57.924 kg), SpO2 100 %. ECOG: 1 General appearance:  Chronically ill-appearing woman without distress. Pale today. Head: Normocephalic, without obvious abnormality no oral ulcers or lesions. Neck: no adenopathy no thyroid masses. Lymph nodes: Cervical, supraclavicular, and axillary nodes normal. Heart:regular rate and rhythm, S1, S2 normal, no murmur, click, rub or gallop Lung:chest clear, no wheezing, rales, normal symmetric air entry.  Abdomin: soft, non-tender, without masses or organomegaly no shifting dullness or  ascites. EXT:no erythema, induration, or nodules   Lab Results: Lab Results  Component Value Date   WBC 4.0 04/20/2015   HGB 7.8* 04/20/2015   HCT 23.8* 04/20/2015   MCV 93.1 04/20/2015   PLT 158 04/20/2015     Chemistry      Component Value Date/Time   NA 138 04/20/2015 1442   NA 133* 03/17/2015 0519   K 4.4 04/20/2015 1442   K 3.4* 03/17/2015 0519   CL 100* 03/17/2015 0519   CO2 22 04/20/2015 1442   CO2 28 03/17/2015 0519   BUN 22.0 04/20/2015 1442   BUN 10 03/17/2015 0519   CREATININE 1.4* 04/20/2015 1442   CREATININE 0.95 03/17/2015 0519      Component Value Date/Time   CALCIUM 9.3 04/20/2015 1442   CALCIUM 8.6* 03/17/2015 0519   ALKPHOS 68 04/20/2015 1442   ALKPHOS 52 03/13/2015 0436   AST 18 04/20/2015 1442   AST 21 03/13/2015 0436   ALT <9 04/20/2015 1442   ALT 14 03/13/2015 0436   BILITOT <0.30 04/20/2015 1442   BILITOT 0.4 03/13/2015 0436       Results for AMAND, LEMOINE (MRN [id number]) as of 04/20/2015 15:00  Ref. Range 11/03/2014 15:36 12/29/2014 15:50 02/02/2015  15:46 03/09/2015 15:04  IgG (Immunoglobin G), Serum Latest Ref Range: [phone] mg/dL 2520 (H) 2290 (H) 2980 (H) 3140 (H)     Impression and Plan:  82 year old woman with the following issues:  1. IgG kappa plasma cell disorder with 78% plasma cell infiltration in the bone marrow. She is quite symptomatic and certainly requires treatment. She meets the criteria of multiple myeloma given her anemia and overall symptomatology.  She is S/P Velcade and dexamethasone. Protein studies after initial response appears to have increased with IgG level of 3140 and an M spike of close to 2.4 g/dL.  Given these findings, she will need an salvage therapy. We have elected to proceed with oral element with dexamethasone. The plan was started at 10 mg daily for 21 days with 1 week off. Side effects associated with this medication were reviewed today. These would include fatigue, tiredness, nausea, diarrhea and possible rash. She is agreeable to proceed and she will taken with dexamethasone on a weekly basis.  I plan on evaluating her response to the first cycle of therapy in 3 weeks.  2. Anemia: Her hemoglobin is lower and she is mildly symptomatic at this time. This is related to disease progression and would benefit from transfusions. We will arrange for that in the near future.  3. Weight loss: Appetite is improving slowly and have gained more weight.  4. Thrombocytopenia: Related to Velcade no bleeding noted.   5. VZV prophylaxis: She continues to be on acyclovir despite being off Velcade probably some next 6 months.  6. Pain: His currently on Dilaudid which have helped her symptoms.  7. Follow-up: Will be 3 to 4 weeks to assess for any complications.     Zola Button, MD 1/13/20174:05 PM

## 2015-04-20 NOTE — Progress Notes (Signed)
Enrolled patient in the Revlimid program. Paperwork faxed to [place].

## 2015-04-23 ENCOUNTER — Encounter: Payer: Self-pay | Admitting: *Deleted

## 2015-04-23 LAB — MULTIPLE MYELOMA PANEL, SERUM
ALBUMIN/GLOB SERPL: 0.7 (ref 0.7–1.7)
ALPHA2 GLOB SERPL ELPH-MCNC: 1.1 g/dL — AB (ref 0.4–1.0)
Albumin SerPl Elph-Mcnc: 3.5 g/dL (ref 2.9–4.4)
Alpha 1: 0.3 g/dL (ref 0.0–0.4)
B-Globulin SerPl Elph-Mcnc: 0.9 g/dL (ref 0.7–1.3)
Gamma Glob SerPl Elph-Mcnc: 3.3 g/dL — ABNORMAL HIGH (ref 0.4–1.8)
Globulin, Total: 5.6 g/dL — ABNORMAL HIGH (ref 2.2–3.9)
IGA/IMMUNOGLOBULIN A, SERUM: 28 mg/dL — AB (ref 64–422)
IGM (IMMUNOGLOBIN M), SRM: 7 mg/dL — AB (ref 26–217)
M Protein SerPl Elph-Mcnc: 3.2 g/dL — ABNORMAL HIGH
Total Protein: 9.1 g/dL — ABNORMAL HIGH (ref 6.0–8.5)

## 2015-04-23 LAB — KAPPA/LAMBDA LIGHT CHAINS
Ig Kappa Free Light Chain: 345.97 mg/L — ABNORMAL HIGH (ref 3.30–19.40)
Ig Lambda Free Light Chain: 3.69 mg/L — ABNORMAL LOW (ref 5.71–26.30)
Kappa/Lambda FluidC Ratio: 93.76 — ABNORMAL HIGH (ref 0.26–1.65)

## 2015-04-23 NOTE — Progress Notes (Signed)
[employer] calling for prior-auth for revlimid. Request put in raquel browning's box in managed care.

## 2015-04-24 ENCOUNTER — Ambulatory Visit (HOSPITAL_COMMUNITY)
Admission: RE | Admit: 2015-04-24 | Discharge: 2015-04-24 | Disposition: A | Discharge status: Home / Self Care | Payer: Medicare Other | Source: Ambulatory Visit | Attending: Oncology | Admitting: Oncology

## 2015-04-24 ENCOUNTER — Other Ambulatory Visit (HOSPITAL_BASED_OUTPATIENT_CLINIC_OR_DEPARTMENT_OTHER): Payer: Medicare Other

## 2015-04-24 ENCOUNTER — Ambulatory Visit (HOSPITAL_BASED_OUTPATIENT_CLINIC_OR_DEPARTMENT_OTHER): Payer: Medicare Other

## 2015-04-24 ENCOUNTER — Encounter: Payer: Self-pay | Admitting: *Deleted

## 2015-04-24 ENCOUNTER — Other Ambulatory Visit: Payer: Self-pay | Admitting: *Deleted

## 2015-04-24 VITALS — BP 175/61 | HR 58 | Temp 97.6°F | Resp 16 | Ht 62.0 in

## 2015-04-24 DIAGNOSIS — D649 Anemia, unspecified: Secondary | ICD-10-CM | POA: Diagnosis present

## 2015-04-24 DIAGNOSIS — C9000 Multiple myeloma not having achieved remission: Secondary | ICD-10-CM | POA: Diagnosis not present

## 2015-04-24 DIAGNOSIS — C9002 Multiple myeloma in relapse: Secondary | ICD-10-CM

## 2015-04-24 LAB — CBC WITH DIFFERENTIAL/PLATELET
BASO%: 0.6 % (ref 0.0–2.0)
Basophils Absolute: 0 10*3/uL (ref 0.0–0.1)
EOS%: 0.7 % (ref 0.0–7.0)
Eosinophils Absolute: 0 10*3/uL (ref 0.0–0.5)
HEMATOCRIT: 26.2 % — AB (ref 34.8–46.6)
HGB: 8.7 g/dL — ABNORMAL LOW (ref 11.6–15.9)
LYMPH#: 1.5 10*3/uL (ref 0.9–3.3)
LYMPH%: 38.8 % (ref 14.0–49.7)
MCH: 30.6 pg (ref 25.1–34.0)
MCHC: 33.1 g/dL (ref 31.5–36.0)
MCV: 92.5 fL (ref 79.5–101.0)
MONO#: 0.5 10*3/uL (ref 0.1–0.9)
MONO%: 12 % (ref 0.0–14.0)
NEUT%: 47.9 % (ref 38.4–76.8)
NEUTROS ABS: 1.9 10*3/uL (ref 1.5–6.5)
PLATELETS: 159 10*3/uL (ref 145–400)
RBC: 2.84 10*6/uL — AB (ref 3.70–5.45)
RDW: 18.3 % — AB (ref 11.2–14.5)
WBC: 4 10*3/uL (ref 3.9–10.3)

## 2015-04-24 LAB — ABO/RH: ABO/RH(D): A NEG

## 2015-04-24 LAB — PREPARE RBC (CROSSMATCH)

## 2015-04-24 MED ORDER — ACETAMINOPHEN 325 MG PO TABS
650.0000 mg | ORAL_TABLET | Freq: Once | ORAL | Status: AC
  Administered 2015-04-24: 650 mg via ORAL

## 2015-04-24 MED ORDER — DIPHENHYDRAMINE HCL 25 MG PO CAPS
ORAL_CAPSULE | ORAL | Status: AC
  Filled 2015-04-24: qty 1

## 2015-04-24 MED ORDER — DIPHENHYDRAMINE HCL 25 MG PO CAPS
25.0000 mg | ORAL_CAPSULE | Freq: Once | ORAL | Status: AC
  Administered 2015-04-24: 25 mg via ORAL

## 2015-04-24 MED ORDER — SODIUM CHLORIDE 0.9 % IV SOLN
250.0000 mL | Freq: Once | INTRAVENOUS | Status: AC
  Administered 2015-04-24: 250 mL via INTRAVENOUS

## 2015-04-24 MED ORDER — ACETAMINOPHEN 325 MG PO TABS
ORAL_TABLET | ORAL | Status: AC
  Filled 2015-04-24: qty 2

## 2015-04-24 NOTE — Progress Notes (Signed)
Faxed icd-10 code and insurance info to [employer] for revlimid

## 2015-04-24 NOTE — Patient Instructions (Signed)

## 2015-04-25 ENCOUNTER — Encounter: Payer: Self-pay | Admitting: Oncology

## 2015-04-25 LAB — TYPE AND SCREEN
ABO/RH(D): A NEG
ANTIBODY SCREEN: NEGATIVE
Unit division: 0

## 2015-04-25 NOTE — Progress Notes (Signed)
Per [employer] revlimid approved thru 04/06/16,I sent to medical records

## 2015-04-26 ENCOUNTER — Encounter: Payer: Self-pay | Admitting: Oncology

## 2015-04-26 NOTE — Progress Notes (Signed)
Per [employer] revlimid was shipped via [employer] 04/25/15

## 2015-04-27 ENCOUNTER — Telehealth: Payer: Self-pay | Admitting: Medical Oncology

## 2015-04-27 ENCOUNTER — Encounter: Payer: Self-pay | Admitting: *Deleted

## 2015-04-27 ENCOUNTER — Other Ambulatory Visit: Payer: Self-pay | Admitting: *Deleted

## 2015-04-27 ENCOUNTER — Telehealth: Payer: Self-pay | Admitting: *Deleted

## 2015-04-27 MED ORDER — DEXAMETHASONE 4 MG PO TABS: ORAL_TABLET | ORAL | Status: DC

## 2015-04-27 NOTE — Progress Notes (Signed)
Patient calling to say her revlimid was not delovered yesterday and she did not leave the house all day. i called [employer] and was told the driver stated no one was home or business was closed. Was given the tracking number so patient could call and inquire re: package location. We are not allowed to call. Instructed patient to call me back if no results.

## 2015-04-27 NOTE — Telephone Encounter (Signed)
Pt called again -she does not have Relimid. I called [employer] -[employer] went yesterday and today and knocked on the door and no one came to the door. I verified the address and it was  is off by 1 number - [employer] will try to reroute the driver to the correct address for  delivery today. Pt  asked about taking decadron and I told her to take it once a week as prescribed. She will need a refill . Note  Dr Hazeline Junker nurse. Pt notified of all this.

## 2015-04-27 NOTE — Telephone Encounter (Signed)
Patient states her medication has not been delivered and she was told it was to be delivered yesterday.

## 2015-05-02 ENCOUNTER — Emergency Department (HOSPITAL_COMMUNITY): Payer: Medicare Other

## 2015-05-02 ENCOUNTER — Encounter (HOSPITAL_COMMUNITY): Payer: Self-pay | Admitting: Emergency Medicine

## 2015-05-02 ENCOUNTER — Observation Stay (HOSPITAL_COMMUNITY)
Admission: EM | Admit: 2015-05-02 | Discharge: 2015-05-06 | Disposition: A | Discharge status: Skilled Nursing Facility | Payer: Medicare Other | Attending: Internal Medicine | Admitting: Internal Medicine

## 2015-05-02 DIAGNOSIS — E785 Hyperlipidemia, unspecified: Secondary | ICD-10-CM | POA: Insufficient documentation

## 2015-05-02 DIAGNOSIS — C9000 Multiple myeloma not having achieved remission: Secondary | ICD-10-CM | POA: Insufficient documentation

## 2015-05-02 DIAGNOSIS — E43 Unspecified severe protein-calorie malnutrition: Secondary | ICD-10-CM | POA: Diagnosis not present

## 2015-05-02 DIAGNOSIS — Z7982 Long term (current) use of aspirin: Secondary | ICD-10-CM | POA: Insufficient documentation

## 2015-05-02 DIAGNOSIS — I25810 Atherosclerosis of coronary artery bypass graft(s) without angina pectoris: Secondary | ICD-10-CM | POA: Insufficient documentation

## 2015-05-02 DIAGNOSIS — D72819 Decreased white blood cell count, unspecified: Secondary | ICD-10-CM | POA: Diagnosis not present

## 2015-05-02 DIAGNOSIS — R531 Weakness: Secondary | ICD-10-CM | POA: Insufficient documentation

## 2015-05-02 DIAGNOSIS — I4891 Unspecified atrial fibrillation: Secondary | ICD-10-CM | POA: Diagnosis not present

## 2015-05-02 DIAGNOSIS — N179 Acute kidney failure, unspecified: Secondary | ICD-10-CM | POA: Diagnosis not present

## 2015-05-02 DIAGNOSIS — D61810 Antineoplastic chemotherapy induced pancytopenia: Secondary | ICD-10-CM | POA: Diagnosis not present

## 2015-05-02 DIAGNOSIS — Z79891 Long term (current) use of opiate analgesic: Secondary | ICD-10-CM | POA: Insufficient documentation

## 2015-05-02 DIAGNOSIS — E860 Dehydration: Secondary | ICD-10-CM | POA: Insufficient documentation

## 2015-05-02 DIAGNOSIS — N182 Chronic kidney disease, stage 2 (mild): Secondary | ICD-10-CM | POA: Insufficient documentation

## 2015-05-02 DIAGNOSIS — E440 Moderate protein-calorie malnutrition: Secondary | ICD-10-CM | POA: Insufficient documentation

## 2015-05-02 DIAGNOSIS — E871 Hypo-osmolality and hyponatremia: Secondary | ICD-10-CM | POA: Diagnosis not present

## 2015-05-02 DIAGNOSIS — R001 Bradycardia, unspecified: Secondary | ICD-10-CM | POA: Diagnosis not present

## 2015-05-02 DIAGNOSIS — Z791 Long term (current) use of non-steroidal anti-inflammatories (NSAID): Secondary | ICD-10-CM | POA: Insufficient documentation

## 2015-05-02 DIAGNOSIS — Z951 Presence of aortocoronary bypass graft: Secondary | ICD-10-CM | POA: Insufficient documentation

## 2015-05-02 DIAGNOSIS — M109 Gout, unspecified: Secondary | ICD-10-CM | POA: Insufficient documentation

## 2015-05-02 DIAGNOSIS — Z87891 Personal history of nicotine dependence: Secondary | ICD-10-CM | POA: Diagnosis not present

## 2015-05-02 DIAGNOSIS — Z79899 Other long term (current) drug therapy: Secondary | ICD-10-CM | POA: Diagnosis not present

## 2015-05-02 DIAGNOSIS — D630 Anemia in neoplastic disease: Secondary | ICD-10-CM | POA: Diagnosis not present

## 2015-05-02 DIAGNOSIS — R29898 Other symptoms and signs involving the musculoskeletal system: Secondary | ICD-10-CM | POA: Diagnosis present

## 2015-05-02 DIAGNOSIS — J9601 Acute respiratory failure with hypoxia: Principal | ICD-10-CM | POA: Insufficient documentation

## 2015-05-02 DIAGNOSIS — T451X5A Adverse effect of antineoplastic and immunosuppressive drugs, initial encounter: Secondary | ICD-10-CM

## 2015-05-02 DIAGNOSIS — I129 Hypertensive chronic kidney disease with stage 1 through stage 4 chronic kidney disease, or unspecified chronic kidney disease: Secondary | ICD-10-CM | POA: Diagnosis not present

## 2015-05-02 DIAGNOSIS — Z79818 Long term (current) use of other agents affecting estrogen receptors and estrogen levels: Secondary | ICD-10-CM | POA: Diagnosis not present

## 2015-05-02 DIAGNOSIS — I252 Old myocardial infarction: Secondary | ICD-10-CM | POA: Insufficient documentation

## 2015-05-02 DIAGNOSIS — R5383 Other fatigue: Secondary | ICD-10-CM | POA: Diagnosis present

## 2015-05-02 DIAGNOSIS — K219 Gastro-esophageal reflux disease without esophagitis: Secondary | ICD-10-CM | POA: Diagnosis not present

## 2015-05-02 DIAGNOSIS — R627 Adult failure to thrive: Secondary | ICD-10-CM | POA: Diagnosis present

## 2015-05-02 DIAGNOSIS — Z6822 Body mass index (BMI) 22.0-22.9, adult: Secondary | ICD-10-CM | POA: Insufficient documentation

## 2015-05-02 DIAGNOSIS — N189 Chronic kidney disease, unspecified: Secondary | ICD-10-CM

## 2015-05-02 DIAGNOSIS — R0902 Hypoxemia: Secondary | ICD-10-CM

## 2015-05-02 DIAGNOSIS — J449 Chronic obstructive pulmonary disease, unspecified: Secondary | ICD-10-CM | POA: Insufficient documentation

## 2015-05-02 HISTORY — DX: Chronic kidney disease, unspecified: N18.9

## 2015-05-02 LAB — COMPREHENSIVE METABOLIC PANEL
ALBUMIN: 3.4 g/dL — AB (ref 3.5–5.0)
ALK PHOS: 53 U/L (ref 38–126)
ALT: 12 U/L — ABNORMAL LOW (ref 14–54)
ANION GAP: 6 (ref 5–15)
AST: 22 U/L (ref 15–41)
BUN: 31 mg/dL — ABNORMAL HIGH (ref 6–20)
CHLORIDE: 104 mmol/L (ref 101–111)
CO2: 24 mmol/L (ref 22–32)
Calcium: 9 mg/dL (ref 8.9–10.3)
Creatinine, Ser: 1.26 mg/dL — ABNORMAL HIGH (ref 0.44–1.00)
GFR calc non Af Amer: 39 mL/min — ABNORMAL LOW (ref >60)
GFR, EST AFRICAN AMERICAN: 45 mL/min — AB (ref >60)
GLUCOSE: 96 mg/dL (ref 65–99)
POTASSIUM: 4.2 mmol/L (ref 3.5–5.1)
SODIUM: 134 mmol/L — AB (ref 135–145)
Total Bilirubin: 0.3 mg/dL (ref 0.3–1.2)
Total Protein: 9.4 g/dL — ABNORMAL HIGH (ref 6.5–8.1)

## 2015-05-02 LAB — CBC WITH DIFFERENTIAL/PLATELET
BASOS PCT: 1 %
Basophils Absolute: 0 10*3/uL (ref 0.0–0.1)
EOS PCT: 0 %
Eosinophils Absolute: 0 10*3/uL (ref 0.0–0.7)
HEMATOCRIT: 28 % — AB (ref 36.0–46.0)
Hemoglobin: 9.2 g/dL — ABNORMAL LOW (ref 12.0–15.0)
LYMPHS PCT: 38 %
Lymphs Abs: 0.6 10*3/uL — ABNORMAL LOW (ref 0.7–4.0)
MCH: 30.2 pg (ref 26.0–34.0)
MCHC: 32.9 g/dL (ref 30.0–36.0)
MCV: 91.8 fL (ref 78.0–100.0)
MONOS PCT: 11 %
Monocytes Absolute: 0.2 10*3/uL (ref 0.1–1.0)
NEUTROS PCT: 50 %
Neutro Abs: 0.7 10*3/uL — ABNORMAL LOW (ref 1.7–7.7)
PLATELETS: 75 10*3/uL — AB (ref 150–400)
RBC: 3.05 MIL/uL — AB (ref 3.87–5.11)
RDW: 17.6 % — ABNORMAL HIGH (ref 11.5–15.5)
WBC: 1.5 10*3/uL — AB (ref 4.0–10.5)

## 2015-05-02 LAB — LIPASE, BLOOD: Lipase: 44 U/L (ref 11–51)

## 2015-05-02 LAB — URINALYSIS, ROUTINE W REFLEX MICROSCOPIC
BILIRUBIN URINE: NEGATIVE
Glucose, UA: NEGATIVE mg/dL
KETONES UR: NEGATIVE mg/dL
Leukocytes, UA: NEGATIVE
NITRITE: NEGATIVE
PROTEIN: 100 mg/dL — AB
SPECIFIC GRAVITY, URINE: 1.018 (ref 1.005–1.030)
pH: 5 (ref 5.0–8.0)

## 2015-05-02 LAB — URINE MICROSCOPIC-ADD ON

## 2015-05-02 MED ORDER — IPRATROPIUM BROMIDE 0.02 % IN SOLN: 0.5000 mg | Freq: Four times a day (QID) | RESPIRATORY_TRACT | Status: DC | PRN

## 2015-05-02 MED ORDER — ONDANSETRON HCL 4 MG/2ML IJ SOLN
4.0000 mg | Freq: Once | INTRAMUSCULAR | Status: AC
  Administered 2015-05-02: 4 mg via INTRAVENOUS
  Filled 2015-05-02: qty 2

## 2015-05-02 MED ORDER — ALBUTEROL SULFATE (2.5 MG/3ML) 0.083% IN NEBU: 1.2500 mg | INHALATION_SOLUTION | Freq: Four times a day (QID) | RESPIRATORY_TRACT | Status: DC | PRN

## 2015-05-02 MED ORDER — SODIUM CHLORIDE 0.9 % IV BOLUS (SEPSIS)
500.0000 mL | Freq: Once | INTRAVENOUS | Status: AC
  Administered 2015-05-02: 500 mL via INTRAVENOUS

## 2015-05-02 MED ORDER — SODIUM CHLORIDE 0.9 % IV SOLN
INTRAVENOUS | Status: DC
  Administered 2015-05-02 – 2015-05-04 (×2): via INTRAVENOUS

## 2015-05-02 NOTE — ED Notes (Signed)
Apple Juice and crackers brought to pt

## 2015-05-02 NOTE — ED Provider Notes (Signed)
CSN: [id number]     Arrival date & time 05/02/15  1746 History   First MD Initiated Contact with Patient 05/02/15 1840     Chief Complaint  Patient presents with  . Chemo Card   . Failure To Thrive     (Consider location/radiation/quality/duration/timing/severity/associated sxs/prior Treatment) The history is provided by the patient.   82 year old female brought in by her daughter. Patient is under current chemotherapy for multiple myeloma by Dr. Alen Blew. Patient started on new oral chemotherapy pill on Friday. Patient did require blood transfusion for anemia on January 17. Patient has not wanted to eat or drink for the past 2 days. Daughter states that in the past she's gotten severely dehydrated. No distinct illness symptoms. No distinct fever no chest pain shortness of breath abdominal pain nausea vomiting or diarrhea.  Patient currently not on oxygen at home.  Past Medical History  Diagnosis Date  . COPD (chronic obstructive pulmonary disease) ([place])   . Gout   . Coronary atherosclerosis of native coronary artery 02/05/2012  . Hypertension   . High blood cholesterol   . Heart murmur     "all my life" (02/05/2012)  . Anginal pain ([place])   . Myocardial infarction ([place]) 1990's; 2000's    "2 total, I think" (02/05/2012)  . Pneumonia     "several times" (02/05/2012)  . Chronic bronchitis ([place])   . Shortness of breath     "sometimes when I'm laying down; always when I do too much" (02/05/2012)  . History of blood transfusion   . External bleeding hemorrhoids     "act up at times" (02/05/2012)  . Chronic back pain     "all over" (02/05/2012)  . Depression   . CAD (coronary artery disease) of artery bypass graft 02/09/14    atretic LIMA  . Multiple myeloma ([place]) 09/07/2014  . A-fib ([place]) 10/09/2014   Past Surgical History  Procedure Laterality Date  . Appendectomy  ? 1970's  . Ectopic pregnancy surgery  1970's  . Abdominal hysterectomy  ? 1970's    "partial 1st time; complete  2nd" (02/05/2012)  . Cholecystectomy  ~ 2010  . Cataract extraction w/ intraocular lens  implant, bilateral  [phone]  . Coronary angioplasty with stent placement  2005  . Coronary artery bypass graft  2005    CABG X2  . Cardiac catheterization  02/09/14    atretic LIMA,  patent VG-dRCA and Total occlusion of the native RCA proximally prior to remotely placement RCA stent, with mild proximal 30% LAD stenosis and 30% distal circumflex stenoses.  EF 55%.  . Left heart catheterization with coronary/graft angiogram N/A 02/09/2014    Procedure: LEFT HEART CATHETERIZATION WITH Beatrix Fetters;  Surgeon: Troy Sine, MD;  Location: [facility] CATH LAB;  Service: Cardiovascular;  Laterality: N/A;  . Esophagogastroduodenoscopy (egd) with propofol Left 05/22/2014    Procedure: ESOPHAGOGASTRODUODENOSCOPY (EGD) WITH PROPOFOL;  Surgeon: Arta Silence, MD;  Location: [facility] ENDOSCOPY;  Service: Endoscopy;  Laterality: Left;   Family History  Problem Relation Age of Onset  . Anemia Neg Hx   . Arrhythmia Neg Hx   . Asthma Neg Hx   . Clotting disorder Neg Hx   . Fainting Neg Hx   . Heart attack Neg Hx   . Heart disease Neg Hx   . Heart failure Neg Hx   . Hyperlipidemia Neg Hx   . Hypertension Neg Hx   . Aneurysm    . CVA    . CVA Mother   .  Aneurysm Father    Social History  Substance Use Topics  . Smoking status: Former Smoker -- 1.00 packs/day for 50 years    Types: Cigarettes    Quit date: 04/02/2011  . Smokeless tobacco: Never Used  . Alcohol Use: Yes     Comment: says she drinks wine once in a while   OB History    No data available     Review of Systems  Constitutional: Positive for appetite change and fatigue. Negative for fever.  HENT: Negative for congestion.   Eyes: Negative for redness.  Respiratory: Negative for shortness of breath.   Cardiovascular: Negative for chest pain.  Gastrointestinal: Negative for nausea, vomiting, abdominal pain and diarrhea.  Genitourinary:  Negative for dysuria.  Musculoskeletal: Negative for back pain.  Skin: Negative for rash.  Neurological: Negative for headaches.  Hematological: Does not bruise/bleed easily.  Psychiatric/Behavioral: Positive for confusion.      Allergies  Lexapro; Soma; Wellbutrin; Albuterol sulfate; Codeine; Levaquin; Plavix; and Statins  Home Medications   Prior to Admission medications   Medication Sig Start Date End Date Taking? Authorizing Provider  acetaminophen (TYLENOL) 325 MG tablet Take 2 tablets (650 mg total) by mouth every 6 (six) hours as needed for mild pain (or Fever >/= 101). 09/13/14  Yes Reyne Dumas, MD  acyclovir (ZOVIRAX) 400 MG tablet Take 1 tablet (400 mg total) by mouth daily. 10/27/14  Yes Adrena E Johnson, PA-C  amLODipine (NORVASC) 10 MG tablet Take 1 tablet (10 mg total) by mouth daily. 03/17/15  Yes Verlee Monte, MD  aspirin 325 MG tablet Take 325 mg by mouth daily.   Yes Historical Provider, MD  cholecalciferol (VITAMIN D) 1000 UNITS tablet Take 1,000 Units by mouth every morning.    Yes Historical Provider, MD  clonazePAM (KLONOPIN) 0.5 MG tablet Take 0.5 mg by mouth 2 (two) times daily as needed for anxiety.  04/13/15  Yes Historical Provider, MD  dexamethasone (DECADRON) 4 MG tablet Take 5 tablets every week before chemotherapy injection. 04/27/15  Yes Wyatt Portela, MD  ezetimibe (ZETIA) 10 MG tablet Take 1 tablet (10 mg total) by mouth at bedtime. 05/25/14  Yes Hosie Poisson, MD  feeding supplement, ENSURE ENLIVE, (ENSURE ENLIVE) LIQD Take 237 mLs by mouth 3 (three) times daily between meals. 09/13/14  Yes Reyne Dumas, MD  HYDROmorphone (DILAUDID) 4 MG tablet Take 1 tablet (4 mg total) by mouth every 4 (four) hours as needed for severe pain. 03/23/15  Yes Wyatt Portela, MD  isosorbide mononitrate (IMDUR) 30 MG 24 hr tablet Take 1 tablet (30 mg total) by mouth daily. 12/26/14  Yes Belva Crome, MD  lenalidomide (REVLIMID) 10 MG capsule Take 1 capsule (10 mg total) by mouth  daily. 04/20/15  Yes Wyatt Portela, MD  Lidocaine 4 % PTCH Apply 1 patch topically daily as needed (pain).   Yes Historical Provider, MD  megestrol (MEGACE ORAL) 40 MG/ML suspension Take 10 mLs (400 mg total) by mouth 2 (two) times daily. 03/23/15  Yes Wyatt Portela, MD  metoprolol succinate (TOPROL-XL) 25 MG 24 hr tablet Take 1 tablet (25 mg total) by mouth daily. 12/26/14  Yes Belva Crome, MD  naproxen sodium (ANAPROX) 220 MG tablet Take 440-660 mg by mouth 3 (three) times daily as needed (pain).   Yes Historical Provider, MD  traMADol (ULTRAM) 50 MG tablet Take 50 mg by mouth every 6 (six) hours as needed for moderate pain or severe pain.  03/06/15  Yes Historical  Provider, MD  albuterol-ipratropium (COMBIVENT) 18-103 MCG/ACT inhaler Inhale 1-2 puffs into the lungs every 4 (four) hours as needed for wheezing or shortness of breath.    Historical Provider, MD  metroNIDAZOLE (FLAGYL) 500 MG tablet Take 1 tablet (500 mg total) by mouth every 8 (eight) hours. Patient not taking: Reported on 05/02/2015 03/17/15   Verlee Monte, MD  nitroGLYCERIN (NITROSTAT) 0.4 MG SL tablet Place 0.4 mg under the tongue every 5 (five) minutes as needed for chest pain. Reported on 04/20/2015    Historical Provider, MD  OVER THE COUNTER MEDICATION Take 2 tablets by mouth at bedtime as needed (restless legs). otc restless leg med    Historical Provider, MD  oxyCODONE-acetaminophen (PERCOCET/ROXICET) 5-325 MG tablet Take 1-2 tablets by mouth every 6 (six) hours as needed for moderate pain or severe pain. Patient not taking: Reported on 05/02/2015 03/09/15   Wyatt Portela, MD  pantoprazole (PROTONIX) 40 MG tablet Take 1 tablet (40 mg total) by mouth 2 (two) times daily. Patient not taking: Reported on 05/02/2015 05/25/14   Hosie Poisson, MD  polyethylene glycol powder (GLYCOLAX/MIRALAX) powder Take 17 g by mouth daily as needed for mild constipation or moderate constipation. Mix with 8 oz liquid and drink 05/16/14   Historical  Provider, MD  prochlorperazine (COMPAZINE) 10 MG tablet Take 1 tablet (10 mg total) by mouth every 8 (eight) hours as needed for nausea. Patient not taking: Reported on 05/02/2015 02/23/15   Velta Addison Mikhail, DO  sennosides-docusate sodium (SENOKOT-S) 8.6-50 MG tablet Take 2 tablets by mouth daily as needed for constipation.    Historical Provider, MD  tiotropium (SPIRIVA) 18 MCG inhalation capsule Place 18 mcg into inhaler and inhale daily as needed (shortness of breath).     Historical Provider, MD   BP 140/85 mmHg  Pulse 62  Temp(Src) 99 F (37.2 C) (Oral)  Resp 20  SpO2 95% Physical Exam  Constitutional: She appears well-developed and well-nourished. No distress.  HENT:  Head: Normocephalic and atraumatic.  Mucous membranes dry.  Eyes: Conjunctivae and EOM are normal. Pupils are equal, round, and reactive to light.  Neck: Normal range of motion.  Cardiovascular: Normal rate, regular rhythm and normal heart sounds.   No murmur heard. Pulmonary/Chest: Effort normal and breath sounds normal. No respiratory distress.  Abdominal: Soft. Bowel sounds are normal. There is no tenderness.  Musculoskeletal: Normal range of motion. She exhibits no edema.  Neurological: She is alert. No cranial nerve deficit. She exhibits normal muscle tone. Coordination normal.  Skin: Skin is warm. No rash noted.  Nursing note and vitals reviewed.   ED Course  Procedures (including critical care time) Labs Review Labs Reviewed  URINALYSIS, ROUTINE W REFLEX MICROSCOPIC (NOT AT [facility]) - Abnormal; Notable for the following:    APPearance CLOUDY (*)    Hgb urine dipstick TRACE (*)    Protein, ur 100 (*)    All other components within normal limits  COMPREHENSIVE METABOLIC PANEL - Abnormal; Notable for the following:    Sodium 134 (*)    BUN 31 (*)    Creatinine, Ser 1.26 (*)    Total Protein 9.4 (*)    Albumin 3.4 (*)    ALT 12 (*)    GFR calc non Af Amer 39 (*)    GFR calc Af Amer 45 (*)    All other  components within normal limits  CBC WITH DIFFERENTIAL/PLATELET - Abnormal; Notable for the following:    WBC 1.5 (*)    RBC 3.05 (*)  Hemoglobin 9.2 (*)    HCT 28.0 (*)    RDW 17.6 (*)    Platelets 75 (*)    Neutro Abs 0.7 (*)    Lymphs Abs 0.6 (*)    All other components within normal limits  URINE MICROSCOPIC-ADD ON - Abnormal; Notable for the following:    Squamous Epithelial / LPF 0-5 (*)    Bacteria, UA FEW (*)    Casts GRANULAR CAST (*)    All other components within normal limits  LIPASE, BLOOD   Results for orders placed or performed during the hospital encounter of 05/02/15  Urinalysis, Routine w reflex microscopic (not at [facility])  Result Value Ref Range   Color, Urine YELLOW YELLOW   APPearance CLOUDY (A) CLEAR   Specific Gravity, Urine 1.018 1.005 - 1.030   pH 5.0 5.0 - 8.0   Glucose, UA NEGATIVE NEGATIVE mg/dL   Hgb urine dipstick TRACE (A) NEGATIVE   Bilirubin Urine NEGATIVE NEGATIVE   Ketones, ur NEGATIVE NEGATIVE mg/dL   Protein, ur 100 (A) NEGATIVE mg/dL   Nitrite NEGATIVE NEGATIVE   Leukocytes, UA NEGATIVE NEGATIVE  Comprehensive metabolic panel  Result Value Ref Range   Sodium 134 (L) 135 - 145 mmol/L   Potassium 4.2 3.5 - 5.1 mmol/L   Chloride 104 101 - 111 mmol/L   CO2 24 22 - 32 mmol/L   Glucose, Bld 96 65 - 99 mg/dL   BUN 31 (H) 6 - 20 mg/dL   Creatinine, Ser 1.26 (H) 0.44 - 1.00 mg/dL   Calcium 9.0 8.9 - 10.3 mg/dL   Total Protein 9.4 (H) 6.5 - 8.1 g/dL   Albumin 3.4 (L) 3.5 - 5.0 g/dL   AST 22 15 - 41 U/L   ALT 12 (L) 14 - 54 U/L   Alkaline Phosphatase 53 38 - 126 U/L   Total Bilirubin 0.3 0.3 - 1.2 mg/dL   GFR calc non Af Amer 39 (L) >60 mL/min   GFR calc Af Amer 45 (L) >60 mL/min   Anion gap 6 5 - 15  Lipase, blood  Result Value Ref Range   Lipase 44 11 - 51 U/L  CBC with Differential/Platelet  Result Value Ref Range   WBC 1.5 (L) 4.0 - 10.5 K/uL   RBC 3.05 (L) 3.87 - 5.11 MIL/uL   Hemoglobin 9.2 (L) 12.0 - 15.0 g/dL   HCT 28.0 (L)  36.0 - 46.0 %   MCV 91.8 78.0 - 100.0 fL   MCH 30.2 26.0 - 34.0 pg   MCHC 32.9 30.0 - 36.0 g/dL   RDW 17.6 (H) 11.5 - 15.5 %   Platelets 75 (L) 150 - 400 K/uL   Neutrophils Relative % 50 %   Lymphocytes Relative 38 %   Monocytes Relative 11 %   Eosinophils Relative 0 %   Basophils Relative 1 %   Neutro Abs 0.7 (L) 1.7 - 7.7 K/uL   Lymphs Abs 0.6 (L) 0.7 - 4.0 K/uL   Monocytes Absolute 0.2 0.1 - 1.0 K/uL   Eosinophils Absolute 0.0 0.0 - 0.7 K/uL   Basophils Absolute 0.0 0.0 - 0.1 K/uL   WBC Morphology WHITE COUNT CONFIRMED ON SMEAR    Smear Review PLATELET COUNT CONFIRMED BY SMEAR   Urine microscopic-add on  Result Value Ref Range   Squamous Epithelial / LPF 0-5 (A) NONE SEEN   WBC, UA 0-5 0 - 5 WBC/hpf   RBC / HPF 0-5 0 - 5 RBC/hpf   Bacteria, UA FEW (A) NONE  SEEN   Casts GRANULAR CAST (A) NEGATIVE     Imaging Review Dg Chest 2 View  05/02/2015  CLINICAL DATA:  History of multiple myeloma. Decreased oral intake in urine output for 1 day. Initial encounter. EXAM: CHEST  2 VIEW COMPARISON:  PA and lateral chest 02/20/2015. FINDINGS: There is cardiomegaly without edema. No consolidative process, pneumothorax or effusion is identified. The patient is status post median sternotomy. No fracture is seen. IMPRESSION: Cardiomegaly without acute disease. Electronically Signed   By: Inge Rise M.D.   On: 05/02/2015 20:23   I have personally reviewed and evaluated these images and lab results as part of my medical decision-making.   EKG Interpretation None      MDM   Final diagnoses:  Leukopenia  Multiple myeloma, remission status unspecified ([place])  Hypoxia    Discussed with on-call oncologist Dr. should Sherren Mocha as well as hospitalist for admission. Patient here with the persistent hypoxia on room air and currently does not have adequate oxygen at home. Patient's been off oxygen for over a year does have a history of COPD. No wheezing here. Machines or not up-to-date and not  working properly. Patient also has multiple myeloma under treatment by hematology oncology. Patient started a new chemotherapy pill for the multiple myeloma going into the weekend. Patient was significant decrease in the white blood cell count and absolute neutrophil count. No true fever MAXIMUM TEMPERATURE 99. Patient will desat off of oxygen down to 84%. On 2 L of oxygen sats low 90s patient not in any respiratory distress. No upper respiratory symptoms. Patient was brought in for decreased appetite and not eating or drinking for the past 2 days. Daughter says this is happened before and she's become dehydrated. No nausea vomiting or diarrhea no abdominal pain no chest pain no shortness of breath.   Patient's workup chest x-ray negative for any evidence of pneumonia. Labs as mentioned showed a leukopenia flexor lites and liver function tests without significant abnormalities. Urinalysis negative for urinary tract infection. In addition not concerned about pulmonary embolus. There is no tachycardia. No leg swelling. However patient does have a history of neoplasm and certainly is at some risk for that. Also no wheezing.  Also noted is a slight increase in her baseline BUN and creatinine which is probably due to some dehydration. Patient received IV fluids here.  Fredia Sorrow, MD 05/02/15 2259

## 2015-05-02 NOTE — ED Notes (Signed)
Pt not currently in room

## 2015-05-02 NOTE — ED Notes (Signed)
Daughter reports patient has had decreased PO intake and UO x1 day. Recently started on new chemo pill for multiple myeloma. Blood transfusion on 1/17. Patient denies new c/c.

## 2015-05-02 NOTE — ED Notes (Signed)
MD at bedside. 

## 2015-05-02 NOTE — ED Notes (Signed)
Patient transported to X-ray 

## 2015-05-02 NOTE — H&P (Signed)
[facility] History and Physical  [name] EHO:[id number] DOB: 1931/08/01 DOA: 05/02/2015  Referring physician: Fredia Sorrow, M.D. PCP: [name], MD   Chief Complaint: Weakness and decreased appetite   HPI: [name] is a 82 y.o. female with a past medical history of COPD, gout, CAD, hypertension, hyperlipidemia, multiple myeloma who was recently started on a new oral chemotherapeutic agent and comes to the emergency department brought by her daughter due to progressively worse weakness of the past several days and decreased appetite and oral intake for the past 2 days. She is chronically anemic and received a blood transfusion about a week ago.   In the ER, incidentally the patient was found to be hypoxic in the mid 80s, but in no acute distress. Her O2 sats improved to the low 90s with 2 L/m nostril cannula. She is supposed to have oxygen at home, but has not used it in a while and the equipment seems to be in no working condition. She states that she feels a lot better with supplemental oxygen. She denies chest pain, palpitations, dizziness, diaphoresis, pitting edema of the lower extremities, nausea, emesis, abdominal pain, diarrhea, melena or hematochezia. She gets frequently constipated.  When seen, the patient was in no acute distress.   Review of Systems:  Constitutional:  Positive fatigue.  No weight loss, night sweats, Fevers, chills. HEENT:  No headaches, Difficulty swallowing,Tooth/dental problems,Sore throat,  No sneezing, itching, ear ache, nasal congestion, post nasal drip,  Cardio-vascular:  No chest pain, Orthopnea, PND, swelling in lower extremities, anasarca, dizziness, palpitations  GI:  loss of appetite  No heartburn, indigestion, abdominal pain, nausea, vomiting, diarrhea, change in bowel habits,  Resp:  Positive dyspnea, occasionally productive cough, occasional wheezing. No hemoptysis.  Skin:  no rash or lesions.  GU:  no  dysuria, change in color of urine, no urgency or frequency. No flank pain.  Musculoskeletal:  Occasional arthralgias. No decreased range of motion. No back pain.  Psych:  No change in mood or affect. No depression or anxiety. No memory loss.   Past Medical History  Diagnosis Date  . COPD (chronic obstructive pulmonary disease) ([place])   . Gout   . Coronary atherosclerosis of native coronary artery 02/05/2012  . Hypertension   . High blood cholesterol   . Heart murmur     "all my life" (02/05/2012)  . Anginal pain ([place])   . Myocardial infarction ([place]) 1990's; 2000's    "2 total, I think" (02/05/2012)  . Pneumonia     "several times" (02/05/2012)  . Chronic bronchitis ([place])   . Shortness of breath     "sometimes when I'm laying down; always when I do too much" (02/05/2012)  . History of blood transfusion   . External bleeding hemorrhoids     "act up at times" (02/05/2012)  . Chronic back pain     "all over" (02/05/2012)  . Depression   . CAD (coronary artery disease) of artery bypass graft 02/09/14    atretic LIMA  . Multiple myeloma ([place]) 09/07/2014  . A-fib ([place]) 10/09/2014   Past Surgical History  Procedure Laterality Date  . Appendectomy  ? 1970's  . Ectopic pregnancy surgery  1970's  . Abdominal hysterectomy  ? 1970's    "partial 1st time; complete 2nd" (02/05/2012)  . Cholecystectomy  ~ 2010  . Cataract extraction w/ intraocular lens  implant, bilateral  [phone]  . Coronary angioplasty with stent placement  2005  . Coronary artery bypass graft  2005    CABG X2  . Cardiac catheterization  02/09/14    atretic LIMA,  patent VG-dRCA and Total occlusion of the native RCA proximally prior to remotely placement RCA stent, with mild proximal 30% LAD stenosis and 30% distal circumflex stenoses.  EF 55%.  . Left heart catheterization with coronary/graft angiogram N/A 02/09/2014    Procedure: LEFT HEART CATHETERIZATION WITH Beatrix Fetters;  Surgeon: Troy Sine, MD;   [facility] C[facility] LAB;  Service: Cardiovascular;  Laterality: N/A;  . Esophagogastroduodenoscopy (egd) with propofol Left 05/22/2014    Procedure: ESOPHAGOGASTRODUODENOSCOPY (EGD) WITH PROPOFOL;  Surgeon: Arta Silence, MD;  Location: [facility] ENDOSCOPY;  Service: Endoscopy;  Laterality: Left;   Social History:  reports that she quit smoking about 4 years ago. Her smoking use included Cigarettes. She has a 50 pack-year smoking history. She has never used smokeless tobacco. She reports that she drinks alcohol. She reports that she does not use illicit drugs.  Allergies  Allergen Reactions  . Lexapro [Escitalopram Oxalate] Other (See Comments)    Fatigue   . Soma [Carisoprodol] Other (See Comments)    Fatigue   . Wellbutrin [Bupropion] Other (See Comments)    "couldn't function and take it"  . Albuterol Sulfate Other (See Comments)    Albuterol HFA inhaler caused nervousness   . Codeine Hives  . Levaquin [Levofloxacin In D5w] Other (See Comments)    Unknown allergic reaction  . Plavix [Clopidogrel Bisulfate] Other (See Comments)    Unknown reaction  . Statins     Unknown reaction     Family History  Problem Relation Age of Onset  . Anemia Neg Hx   . Arrhythmia Neg Hx   . Asthma Neg Hx   . Clotting disorder Neg Hx   . Fainting Neg Hx   . Heart attack Neg Hx   . Heart disease Neg Hx   . Heart failure Neg Hx   . Hyperlipidemia Neg Hx   . Hypertension Neg Hx   . Aneurysm    . CVA    . CVA Mother   . Aneurysm Father     Prior to Admission medications   Medication Sig Start Date End Date Taking? Authorizing Provider  acetaminophen (TYLENOL) 325 MG tablet Take 2 tablets (650 mg total) by mouth every 6 (six) hours as needed for mild pain (or Fever >/= 101). 09/13/14  Yes Reyne Dumas, MD  acyclovir (ZOVIRAX) 400 MG tablet Take 1 tablet (400 mg total) by mouth daily. 10/27/14  Yes Adrena E Johnson, PA-C  amLODipine (NORVASC) 10 MG tablet Take 1 tablet (10 mg total) by mouth daily.  03/17/15  Yes Verlee Monte, MD  aspirin 325 MG tablet Take 325 mg by mouth daily.   Yes Historical Provider, MD  cholecalciferol (VITAMIN D) 1000 UNITS tablet Take 1,000 Units by mouth every morning.    Yes Historical Provider, MD  clonazePAM (KLONOPIN) 0.5 MG tablet Take 0.5 mg by mouth 2 (two) times daily as needed for anxiety.  04/13/15  Yes Historical Provider, MD  dexamethasone (DECADRON) 4 MG tablet Take 5 tablets every week before chemotherapy injection. 04/27/15  Yes Wyatt Portela, MD  ezetimibe (ZETIA) 10 MG tablet Take 1 tablet (10 mg total) by mouth at bedtime. 05/25/14  Yes Hosie Poisson, MD  feeding supplement, ENSURE ENLIVE, (ENSURE ENLIVE) LIQD Take 237 mLs by mouth 3 (three) times daily between meals. 09/13/14  Yes Reyne Dumas, MD  HYDROmorphone (DILAUDID) 4 MG tablet Take 1 tablet (4 mg  total) by mouth every 4 (four) hours as needed for severe pain. 03/23/15  Yes Wyatt Portela, MD  isosorbide mononitrate (IMDUR) 30 MG 24 hr tablet Take 1 tablet (30 mg total) by mouth daily. 12/26/14  Yes Belva Crome, MD  lenalidomide (REVLIMID) 10 MG capsule Take 1 capsule (10 mg total) by mouth daily. 04/20/15  Yes Wyatt Portela, MD  Lidocaine 4 % PTCH Apply 1 patch topically daily as needed (pain).   Yes Historical Provider, MD  megestrol (MEGACE ORAL) 40 MG/ML suspension Take 10 mLs (400 mg total) by mouth 2 (two) times daily. 03/23/15  Yes Wyatt Portela, MD  metoprolol succinate (TOPROL-XL) 25 MG 24 hr tablet Take 1 tablet (25 mg total) by mouth daily. 12/26/14  Yes Belva Crome, MD  naproxen sodium (ANAPROX) 220 MG tablet Take 440-660 mg by mouth 3 (three) times daily as needed (pain).   Yes Historical Provider, MD  traMADol (ULTRAM) 50 MG tablet Take 50 mg by mouth every 6 (six) hours as needed for moderate pain or severe pain.  03/06/15  Yes Historical Provider, MD  albuterol-ipratropium (COMBIVENT) 18-103 MCG/ACT inhaler Inhale 1-2 puffs into the lungs every 4 (four) hours as needed for wheezing  or shortness of breath.    Historical Provider, MD  metroNIDAZOLE (FLAGYL) 500 MG tablet Take 1 tablet (500 mg total) by mouth every 8 (eight) hours. Patient not taking: Reported on 05/02/2015 03/17/15   Verlee Monte, MD  nitroGLYCERIN (NITROSTAT) 0.4 MG SL tablet Place 0.4 mg under the tongue every 5 (five) minutes as needed for chest pain. Reported on 04/20/2015    Historical Provider, MD  OVER THE COUNTER MEDICATION Take 2 tablets by mouth at bedtime as needed (restless legs). otc restless leg med    Historical Provider, MD  oxyCODONE-acetaminophen (PERCOCET/ROXICET) 5-325 MG tablet Take 1-2 tablets by mouth every 6 (six) hours as needed for moderate pain or severe pain. Patient not taking: Reported on 05/02/2015 03/09/15   Wyatt Portela, MD  pantoprazole (PROTONIX) 40 MG tablet Take 1 tablet (40 mg total) by mouth 2 (two) times daily. Patient not taking: Reported on 05/02/2015 05/25/14   Hosie Poisson, MD  polyethylene glycol powder (GLYCOLAX/MIRALAX) powder Take 17 g by mouth daily as needed for mild constipation or moderate constipation. Mix with 8 oz liquid and drink 05/16/14   Historical Provider, MD  prochlorperazine (COMPAZINE) 10 MG tablet Take 1 tablet (10 mg total) by mouth every 8 (eight) hours as needed for nausea. Patient not taking: Reported on 05/02/2015 02/23/15   Velta Addison Mikhail, DO  sennosides-docusate sodium (SENOKOT-S) 8.6-50 MG tablet Take 2 tablets by mouth daily as needed for constipation.    Historical Provider, MD  tiotropium (SPIRIVA) 18 MCG inhalation capsule Place 18 mcg into inhaler and inhale daily as needed (shortness of breath).     Historical Provider, MD   Physical Exam: Filed Vitals:   05/02/15 2130 05/02/15 2200 05/02/15 2230 05/02/15 2300  BP: 140/54 143/61 140/85 146/52  Pulse: 57  62   Temp:      TempSrc:      Resp: _0 SpO2: 93%  95%     Wt Readings from Last 3 Encounters:  04/20/15 57.924 kg (127 lb 11.2 oz)  03/23/15 58.015 kg (127 lb 14.4 oz)    03/13/15 57.516 kg (126 lb 12.8 oz)    General:  Appears calm and comfortable. Eyes: PERRL, normal lids, irises & conjunctiva ENT: grossly normal hearing, lips  and oral mucosa are dry Neck: no LAD, masses or thyromegaly Cardiovascular: RRR, positive systolic murmur, no /r/g. No LE edema. Telemetry: SR, no arrhythmias  Respiratory: Decreased breath sounds at bases, mild bibasilar crackles, subtle wheezing. Abdomen: soft, ntnd Skin: no rash or induration seen on limited exam Musculoskeletal: grossly normal tone BUE/BLE Psychiatric: grossly normal mood and affect, speech fluent and appropriate Neurologic: Awake, alert, oriented 3, grossly non-focal.          Labs on Admission:  Basic Metabolic Panel:  Recent Labs Lab 05/02/15 2014  NA 134*  K 4.2  CL 104  CO2 24  GLUCOSE 96  BUN 31*  CREATININE 1.26*  CALCIUM 9.0   Liver Function Tests:  Recent Labs Lab 05/02/15 2014  AST 22  ALT 12*  ALKPHOS 53  BILITOT 0.3  PROT 9.4*  ALBUMIN 3.4*    Recent Labs Lab 05/02/15 2014  LIPASE 44   CBC:  Recent Labs Lab 05/02/15 2014  WBC 1.5*  NEUTROABS 0.7*  HGB 9.2*  HCT 28.0*  MCV 91.8  PLT 75*    BNP (last 3 results)  Recent Labs  05/23/14 1445 08/13/14 1216 10/09/14 1524  BNP 537.1* 150.0* 537.4*     Radiological Exams on Admission: Dg Chest 2 View  05/02/2015  CLINICAL DATA:  History of multiple myeloma. Decreased oral intake in urine output for 1 day. Initial encounter. EXAM: CHEST  2 VIEW COMPARISON:  PA and lateral chest 02/20/2015. FINDINGS: There is cardiomegaly without edema. No consolidative process, pneumothorax or effusion is identified. The patient is status post median sternotomy. No fracture is seen. IMPRESSION: Cardiomegaly without acute disease. Electronically Signed   By: Inge Rise M.D.   On: 05/02/2015 20:23    echocardiogram 09/11/2014 ------------------------------------------------------------------- LV EF: 60% -   65%  ------------------------------------------------------------------- Indications:   CVA 436.  ------------------------------------------------------------------- History:  PMH: Sick sinus syndrome. Sepsis. Chest pain. Coronary artery disease. Chronic obstructive pulmonary disease. Risk factors: Hypertension. Dyslipidemia.  ------------------------------------------------------------------- Study Conclusions  - Left ventricle: The cavity size was normal. Wall thickness was normal. Systolic function was normal. The estimated ejection fraction was in the range of 60% to 65%. Wall motion was normal; there were no regional wall motion abnormalities. The study is not technically sufficient to allow evaluation of LV diastolic function. - Mitral valve: Calcified annulus. Mildly thickened leaflets . - Left atrium: The atrium was mildly dilated. - Right atrium: The atrium was mildly dilated. - Atrial septum: No defect or patent foramen ovale was identified. - Tricuspid valve: There was mild regurgitation. - Pulmonary arteries: PA peak pressure: 30 mm Hg (S). - Inferior vena cava: The vessel was normal in size. The respirophasic diameter changes were in the normal range (>= 50%), consistent with normal central venous pressure.  Impressions:  - Compared to the prior study in 05/2014, the LVEF is higher at 60-65%.  EKG: Independently reviewed. Not available.   Assessment/Plan Principal Problem:   Hypoxia   Fatigue Secondary to COPD, anemia, multiple myeloma. Admit to observation. Continue supplemental oxygen. Gentle IV hydration The patient will need replacement of her oxygen supply equipment at home.  Active Problems:   COPD (chronic obstructive pulmonary disease) (HCC) No signs of acute exacerbation. Continue bronchodilators as needed. Set up supplemental oxygen equipment replacement at home in a.m.    GERD (gastroesophageal reflux  disease) Continue pantoprazole 40 mg by mouth twice a day.    Hypertension Continue amlodipine 10 mg by mouth daily. Continue metoprolol ER 25 mg by mouth  daily. Monitor blood pressure periodically.    CAD (coronary artery disease) Stable. Continue aspirin. Continue metoprolol 25 mg by mouth daily     Multiple myeloma ([place]) Continue care as per oncology.      Paroxysmal atrial fibrillation (HCC)   CHA2DS2-VASc score 7 Continue metoprolol for rate control. Not on long-term anticoagulation. Continue aspirin.     Anemia Monitor hematocrit and hemoglobin.   Oncology (Dr. Sherren Mocha) was consulted by the ED and the oncology service will evaluate the patient in a.m..   Code Status: Full code. DVT Prophylaxis: Lovenox SQ. Family Communication:  Disposition Plan: Admit to telemetry for overnight observation.  Time spent: Over 70 minutes were spent in the process of this admission.  Reubin Milan, M.D. [facility] Pager [phone].

## 2015-05-03 DIAGNOSIS — N189 Chronic kidney disease, unspecified: Secondary | ICD-10-CM

## 2015-05-03 DIAGNOSIS — R29898 Other symptoms and signs involving the musculoskeletal system: Secondary | ICD-10-CM | POA: Diagnosis present

## 2015-05-03 DIAGNOSIS — I480 Paroxysmal atrial fibrillation: Secondary | ICD-10-CM

## 2015-05-03 DIAGNOSIS — R0902 Hypoxemia: Secondary | ICD-10-CM | POA: Diagnosis not present

## 2015-05-03 DIAGNOSIS — R001 Bradycardia, unspecified: Secondary | ICD-10-CM

## 2015-05-03 DIAGNOSIS — D630 Anemia in neoplastic disease: Secondary | ICD-10-CM | POA: Diagnosis not present

## 2015-05-03 DIAGNOSIS — N179 Acute kidney failure, unspecified: Secondary | ICD-10-CM | POA: Diagnosis present

## 2015-05-03 DIAGNOSIS — E871 Hypo-osmolality and hyponatremia: Secondary | ICD-10-CM | POA: Diagnosis present

## 2015-05-03 LAB — COMPREHENSIVE METABOLIC PANEL
ALT: 11 U/L — ABNORMAL LOW (ref 14–54)
AST: 23 U/L (ref 15–41)
Albumin: 3 g/dL — ABNORMAL LOW (ref 3.5–5.0)
Alkaline Phosphatase: 49 U/L (ref 38–126)
Anion gap: 6 (ref 5–15)
BUN: 29 mg/dL — ABNORMAL HIGH (ref 6–20)
CHLORIDE: 104 mmol/L (ref 101–111)
CO2: 24 mmol/L (ref 22–32)
Calcium: 8 mg/dL — ABNORMAL LOW (ref 8.9–10.3)
Creatinine, Ser: 1.08 mg/dL — ABNORMAL HIGH (ref 0.44–1.00)
GFR, EST AFRICAN AMERICAN: 54 mL/min — AB (ref >60)
GFR, EST NON AFRICAN AMERICAN: 47 mL/min — AB (ref >60)
Glucose, Bld: 101 mg/dL — ABNORMAL HIGH (ref 65–99)
POTASSIUM: 4 mmol/L (ref 3.5–5.1)
Sodium: 134 mmol/L — ABNORMAL LOW (ref 135–145)
Total Bilirubin: 0.7 mg/dL (ref 0.3–1.2)
Total Protein: 8.4 g/dL — ABNORMAL HIGH (ref 6.5–8.1)

## 2015-05-03 LAB — CBC WITH DIFFERENTIAL/PLATELET
Basophils Absolute: 0 10*3/uL (ref 0.0–0.1)
Basophils Relative: 0 %
Eosinophils Absolute: 0 10*3/uL (ref 0.0–0.7)
Eosinophils Relative: 0 %
HEMATOCRIT: 27.5 % — AB (ref 36.0–46.0)
Hemoglobin: 8.7 g/dL — ABNORMAL LOW (ref 12.0–15.0)
LYMPHS ABS: 0.5 10*3/uL — AB (ref 0.7–4.0)
LYMPHS PCT: 38 %
MCH: 30 pg (ref 26.0–34.0)
MCHC: 31.6 g/dL (ref 30.0–36.0)
MCV: 94.8 fL (ref 78.0–100.0)
Monocytes Absolute: 0.1 10*3/uL (ref 0.1–1.0)
Monocytes Relative: 11 %
NEUTROS PCT: 50 %
Neutro Abs: 0.6 10*3/uL — ABNORMAL LOW (ref 1.7–7.7)
PLATELETS: 70 10*3/uL — AB (ref 150–400)
RBC: 2.9 MIL/uL — AB (ref 3.87–5.11)
RDW: 17.7 % — ABNORMAL HIGH (ref 11.5–15.5)
WBC: 1.3 10*3/uL — AB (ref 4.0–10.5)

## 2015-05-03 LAB — MAGNESIUM: Magnesium: 1.6 mg/dL — ABNORMAL LOW (ref 1.7–2.4)

## 2015-05-03 LAB — PHOSPHORUS: Phosphorus: 5.3 mg/dL — ABNORMAL HIGH (ref 2.5–4.6)

## 2015-05-03 MED ORDER — NAPROXEN SODIUM 275 MG PO TABS
275.0000 mg | ORAL_TABLET | Freq: Three times a day (TID) | ORAL | Status: DC | PRN
  Administered 2015-05-03 – 2015-05-05 (×4): 550 mg via ORAL
  Filled 2015-05-03 (×7): qty 2

## 2015-05-03 MED ORDER — VITAMIN D3 25 MCG (1000 UNIT) PO TABS
1000.0000 [IU] | ORAL_TABLET | Freq: Every morning | ORAL | Status: DC
  Administered 2015-05-03 – 2015-05-06 (×4): 1000 [IU] via ORAL
  Filled 2015-05-03 (×4): qty 1

## 2015-05-03 MED ORDER — SODIUM CHLORIDE 0.9% FLUSH
3.0000 mL | Freq: Two times a day (BID) | INTRAVENOUS | Status: DC
  Administered 2015-05-03 – 2015-05-05 (×4): 3 mL via INTRAVENOUS

## 2015-05-03 MED ORDER — POLYETHYLENE GLYCOL 3350 17 G PO PACK
17.0000 g | PACK | Freq: Every day | ORAL | Status: DC | PRN
  Administered 2015-05-05: 17 g via ORAL
  Filled 2015-05-03: qty 1

## 2015-05-03 MED ORDER — MEGESTROL ACETATE 40 MG/ML PO SUSP
400.0000 mg | Freq: Two times a day (BID) | ORAL | Status: DC
  Administered 2015-05-03 – 2015-05-06 (×8): 400 mg via ORAL
  Filled 2015-05-03 (×9): qty 10

## 2015-05-03 MED ORDER — TRAMADOL HCL 50 MG PO TABS
50.0000 mg | ORAL_TABLET | Freq: Four times a day (QID) | ORAL | Status: DC | PRN
  Administered 2015-05-03 – 2015-05-06 (×6): 50 mg via ORAL
  Filled 2015-05-03 (×6): qty 1

## 2015-05-03 MED ORDER — ASPIRIN 325 MG PO TABS
325.0000 mg | ORAL_TABLET | Freq: Every day | ORAL | Status: DC
  Administered 2015-05-03 – 2015-05-06 (×4): 325 mg via ORAL
  Filled 2015-05-03 (×4): qty 1

## 2015-05-03 MED ORDER — EZETIMIBE 10 MG PO TABS
10.0000 mg | ORAL_TABLET | Freq: Every day | ORAL | Status: DC
  Administered 2015-05-03 – 2015-05-05 (×4): 10 mg via ORAL
  Filled 2015-05-03 (×5): qty 1

## 2015-05-03 MED ORDER — SODIUM CHLORIDE 0.9 % IV SOLN: 250.0000 mL | INTRAVENOUS | Status: DC | PRN

## 2015-05-03 MED ORDER — ISOSORBIDE MONONITRATE ER 30 MG PO TB24
30.0000 mg | ORAL_TABLET | Freq: Every day | ORAL | Status: DC
  Administered 2015-05-03 – 2015-05-06 (×4): 30 mg via ORAL
  Filled 2015-05-03 (×4): qty 1

## 2015-05-03 MED ORDER — SENNOSIDES-DOCUSATE SODIUM 8.6-50 MG PO TABS
2.0000 | ORAL_TABLET | Freq: Every day | ORAL | Status: DC | PRN
  Administered 2015-05-04 – 2015-05-05 (×2): 2 via ORAL
  Filled 2015-05-03 (×4): qty 2

## 2015-05-03 MED ORDER — ACETAMINOPHEN 325 MG PO TABS: 650.0000 mg | ORAL_TABLET | Freq: Four times a day (QID) | ORAL | Status: DC | PRN

## 2015-05-03 MED ORDER — PANTOPRAZOLE SODIUM 40 MG PO TBEC
40.0000 mg | DELAYED_RELEASE_TABLET | Freq: Every day | ORAL | Status: DC
  Administered 2015-05-03 – 2015-05-06 (×5): 40 mg via ORAL
  Filled 2015-05-03 (×5): qty 1

## 2015-05-03 MED ORDER — TIOTROPIUM BROMIDE MONOHYDRATE 18 MCG IN CAPS
18.0000 ug | ORAL_CAPSULE | Freq: Every day | RESPIRATORY_TRACT | Status: DC | PRN
Start: 2015-05-03 — End: 2015-05-06

## 2015-05-03 MED ORDER — SODIUM CHLORIDE 0.9% FLUSH: 3.0000 mL | INTRAVENOUS | Status: DC | PRN

## 2015-05-03 MED ORDER — AMLODIPINE BESYLATE 10 MG PO TABS
10.0000 mg | ORAL_TABLET | Freq: Every day | ORAL | Status: DC
  Administered 2015-05-03 – 2015-05-06 (×3): 10 mg via ORAL
  Filled 2015-05-03 (×4): qty 1

## 2015-05-03 MED ORDER — LENALIDOMIDE 10 MG PO CAPS: 10.0000 mg | ORAL_CAPSULE | Freq: Every day | ORAL | Status: DC

## 2015-05-03 MED ORDER — ENSURE ENLIVE PO LIQD
237.0000 mL | Freq: Three times a day (TID) | ORAL | Status: DC
  Administered 2015-05-03 – 2015-05-04 (×3): 237 mL via ORAL
  Filled 2015-05-03: qty 237

## 2015-05-03 MED ORDER — LIDOCAINE 5 % EX PTCH
1.0000 | MEDICATED_PATCH | Freq: Every day | CUTANEOUS | Status: DC | PRN
  Administered 2015-05-03 – 2015-05-06 (×4): 1 via TRANSDERMAL
  Filled 2015-05-03 (×5): qty 1

## 2015-05-03 MED ORDER — SODIUM CHLORIDE 0.9% FLUSH
3.0000 mL | Freq: Two times a day (BID) | INTRAVENOUS | Status: DC
  Administered 2015-05-05 (×2): 3 mL via INTRAVENOUS

## 2015-05-03 MED ORDER — CLONAZEPAM 0.5 MG PO TABS: 0.5000 mg | ORAL_TABLET | Freq: Two times a day (BID) | ORAL | Status: DC | PRN

## 2015-05-03 MED ORDER — POLYETHYLENE GLYCOL 3350 17 GM/SCOOP PO POWD
17.0000 g | Freq: Every day | ORAL | Status: DC | PRN
  Filled 2015-05-03: qty 255

## 2015-05-03 MED ORDER — METOPROLOL SUCCINATE ER 25 MG PO TB24
25.0000 mg | ORAL_TABLET | Freq: Every day | ORAL | Status: DC
  Administered 2015-05-03: 25 mg via ORAL
  Filled 2015-05-03 (×2): qty 1

## 2015-05-03 MED ORDER — NITROGLYCERIN 0.4 MG SL SUBL: 0.4000 mg | SUBLINGUAL_TABLET | SUBLINGUAL | Status: DC | PRN

## 2015-05-03 NOTE — Progress Notes (Signed)
CRITICAL VALUE ALERT  Critical value received:  Wbc 1.3  Date of notification:  05/03/15  Time of notification:  0601  Critical value read back:Yes.    Nurse who received alert:  Q. Core RN  MD notified (1st page):  Baltazar Najjar  Time of first page:  0608  MD notified (2nd page):  Time of second page:  Responding MD:    Time MD responded:

## 2015-05-03 NOTE — Progress Notes (Signed)
Pharmacy Brief note:  Revlimid  By [facility] policy, Revlimid is automatically held when any of the following laboratory values occur:   ANC <1000  Plts < 50 K  AST or ALT > 3x ULN  Bili > 1.5x ULN  Acute venous thromboembolic event  [id number]:  ANC= 600  Revlimid has been held (discontinued from the profile) per policy.  Thanks Dorrene German 05/03/2015 3:48 AM

## 2015-05-03 NOTE — Progress Notes (Signed)
Patient ID: [name], female   DOB: [birth year], 82 y.o.   MRN: [id number] [name] PROGRESS NOTE  CATRINIA RACICOT WJX:[id number] DOB: 1932-03-30 DOA: 05/02/2015 PCP: [name], MD   Brief narrative:    82 y.o. female with known COPD, gout, CAD, hypertension, hyperlipidemia, multiple myeloma who was recently started on a new oral chemotherapeutic agent and has presented to the emergency department due to progressively worse weakness and decreased appetite and oral intake for the past 2 days. She is chronically anemic and has received a blood transfusion about a week ago.   In the ER, incidentally the patient was found to be hypoxic in the mid 80s, but in no acute distress. Her O2 sats improved to the low 90s with 2 L/m [place]. She is supposed to have oxygen at home, but has not used it in a while and the equipment seems to be in no working condition. She was admitted for further evaluation.   Assessment/Plan:    Principal Problem:   Hypoxia - unclear etiology, CXR with no acute findings  - pt reports feeling better this AM, oxygen saturation is stable for now but pt still requiring O2 via [place] - discussed with pt CT chest and she would like to hold off on it for now - will re evaluate need for CT chest in AM  Active Problems:   Multiple myeloma [facility]) - will notify primary oncologist Dr Alen Blew of pt's admission     A-fib, CHA2DS2-VASc score 7 - not an AC candidate due to high risk bleed  - monitor on telemetry     Acute kidney injury superimposed on chronic kidney disease, stage II ([place]) - IVF have been provided and Cr trending down  - BMP in AM    Weakness of both lower extremities - secondary to progressive FTT, MM - PT/OT evaluation requested     COPD (chronic obstructive pulmonary disease) (HCC) - no wheezing on exam this AM but certainly possible that it contributes to hypoxia - provide BD as needed     Pancytopenia due to antineoplastic chemotherapy ([place]) -  hold on transfusions for now - CBC in AM    GERD (gastroesophageal reflux disease) - no gastritis signs - continue PPI    CAD (coronary artery disease) of artery bypass graft, atretic LIMA - asymptomatic     Protein-calorie malnutrition, severe ([place]) - nutritionist consulted     Bradycardia - asymptomatic - monitor on tele     Hyponatremia - stable   DVT prophylaxis - SCD's due to high risk bleed   Code Status: Full.  Family Communication:  plan of care discussed with the patient Disposition Plan: Home pending stable CBC and PT evaluation    IV access:  Peripheral IV  Procedures and diagnostic studies:    Dg Chest 2 View 05/02/2015  Cardiomegaly without acute disease.   Medical Consultants:  None  Other Consultants:  PT OT  IAnti-Infectives:   None  Faye Ramsay, MD  [place] Pager [phone]  If 7PM-7AM, please contact night-coverage [email or web address] Password [email or web address] 05/03/2015, 5:10 PM  HPI/Subjective: No events overnight.   Objective: Filed Vitals:   05/02/15 2300 05/03/15 0028 05/03/15 0030 05/03/15 0605  BP: 146/52 141/47  140/51  Pulse:  55  57  Temp:  98.7 F (37.1 C)  98.6 F (37 C)  TempSrc:  Oral  Oral  Resp: _0 Height:   _1  (1.575 m)   Weight:   55.6 kg (122 lb  9.2 oz)   SpO2:  95%  95%    Intake/Output Summary (Last 24 hours) at 05/03/15 1710 Last data filed at 05/03/15 1232  Gross per 24 hour  Intake    480 ml  Output    500 ml  Net    -20 ml    Exam:   General:  Pt is alert, follows commands appropriately, not in acute distress  Cardiovascular: Regular rate and rhythm, S1/S2, no murmurs, no rubs, no gallops  Respiratory: Clear to auscultation bilaterally, no wheezing, no crackles, no rhonchi  Abdomen: Soft, non tender, non distended, bowel sounds present, no guarding  Extremities: No edema, pulses DP and PT palpable bilaterally  Neuro: Grossly nonfocal  Data Reviewed: Basic Metabolic Panel:  Recent  Labs Lab 05/02/15 2014 05/03/15 0520  NA 134* 134*  K 4.2 4.0  CL 104 104  CO2 24 24  GLUCOSE 96 101*  BUN 31* 29*  CREATININE 1.26* 1.08*  CALCIUM 9.0 8.0*  MG  --  1.6*  PHOS  --  5.3*   Liver Function Tests:  Recent Labs Lab 05/02/15 2014 05/03/15 0520  AST 22 23  ALT 12* 11*  ALKPHOS 53 49  BILITOT 0.3 0.7  PROT 9.4* 8.4*  ALBUMIN 3.4* 3.0*    Recent Labs Lab 05/02/15 2014  LIPASE 44   CBC:  Recent Labs Lab 05/02/15 2014 05/03/15 0520  WBC 1.5* 1.3*  NEUTROABS 0.7* 0.6*  HGB 9.2* 8.7*  HCT 28.0* 27.5*  MCV 91.8 94.8  PLT 75* 70*   Scheduled Meds: . amLODipine  10 mg Oral Daily  . aspirin  325 mg Oral Daily  . cholecalciferol  1,000 Units Oral q morning - 10a  . ezetimibe  10 mg Oral QHS  . feeding supplement (ENSURE ENLIVE)  237 mL Oral TID BM  . isosorbide mononitrate  30 mg Oral Daily  . megestrol  400 mg Oral BID  . metoprolol succinate  25 mg Oral Daily  . pantoprazole  40 mg Oral Daily  . sodium chloride flush  3 mL Intravenous Q12H  . sodium chloride flush  3 mL Intravenous Q12H   Continuous Infusions: . sodium chloride 75 mL/hr at 05/02/15 2117

## 2015-05-03 NOTE — Clinical Social Work Note (Signed)
Clinical Social Work Assessment  Patient Details  Name: [name] MRN: [id number] Date of Birth: 08/10/1931  Date of referral:  05/03/15               Reason for consult:  Facility Placement                Permission sought to share information with:  Facility Contact Representative, Family Supports Permission granted to share information::  Yes, Verbal Permission Granted  Name::        Agency::     Relationship::     Contact Information:     Housing/Transportation Living arrangements for the past 2 months:  Single Family Home Source of Information:  Patient Patient Interpreter Needed:  None Criminal Activity/Legal Involvement Pertinent to Current Situation/Hospitalization:  No - Comment as needed Significant Relationships:  Adult Children Lives with:  Adult Children Do you feel safe going back to the place where you live?  No Need for family participation in patient care:  No (Coment)  Care giving concerns:  No concerns expressed at this time.    Social Worker assessment / plan:  CSW met with pt at bedside. Pt is agreeable to SNF placement upon d/c as she lives with her adult daughter who works full time and is unable to provide 24 hr care. CSW will initiate SNF search; pt lives in [place]- CSW will attempt to find placement close to her home.   Employment status:  Retired Insurance information:  Managed Medicare PT Recommendations:  Skilled Nursing Facility Information / Referral to community resources:  Skilled Nursing Facility  Patient/Family's Response to care: Pt appears somewhat concerned about SNF placement stating she "has to be okay" with the recommendation. She's been to a SNF before but was unable to remember which one.   Patient/Family's Understanding of and Emotional Response to Diagnosis, Current Treatment, and Prognosis:  Pt is realistic regarding her current needs/level of care and is in agreement with SNF placement, even though it appears to make her  somewhat nervous. CSW assures pt she will review bed offers once available and make sure she's fully informed about all facility options.   Emotional Assessment Appearance:  Appears stated age Attitude/Demeanor/Rapport:   (Pleasant and cooperative) Affect (typically observed):  Accepting, Adaptable, Appropriate Orientation:  Oriented to Self, Oriented to Place, Oriented to  Time, Oriented to Situation Alcohol / Substance use:  Not Applicable Psych involvement (Current and /or in the community):  No (Comment)  Discharge Needs  Concerns to be addressed:  No discharge needs identified Readmission within the last 30 days:  No Current discharge risk:  None Barriers to Discharge:  No Barriers Identified   Sarah M Gonzalez-Graham, LCSW 05/03/2015, 3:40 PM  

## 2015-05-03 NOTE — Evaluation (Signed)
Physical Therapy Evaluation Patient Details Name: [name] MRN: [id number] DOB: 06/22/1931 Today's Date: 05/03/2015   History of Present Illness  82 yo female admitted with hypoxia. Hx of multiple myeloma, COPD, HTN, MI, chronic back pain, gout, A fib, anemia.   Clinical Impression  On eval, pt required Min-Mod assist for mobility-performed stand pivot x2 with RW. Unable to attempt ambulation on today due to weakness, fatigue. O2 sats during session: 95% 3L O2 at rest, 92% on RA at rest, 90% on RA with small amount of activity. No family present during session. Recommend ST rehab at SNF at this time if pt/family agreeable.     Follow Up Recommendations SNF;Supervision/Assistance - 24 hour (HHPT if pt will have assistance during day at home (daughter works)    Equipment Recommendations  None recommended by PT    Recommendations for Other Services OT consult     Precautions / Restrictions Precautions Precautions: Fall Precaution Comments: monitor sats Restrictions Weight Bearing Restrictions: No      Mobility  Bed Mobility Overal bed mobility: Needs Assistance Bed Mobility: Supine to Sit;Sit to Supine     Supine to sit: Min assist Sit to supine: Min guard   General bed mobility comments: Assist to get to EOB. Increased time. Pt c/o dizziness. Unable to assess BP due to dynamap   Transfers Overall transfer level: Needs assistance Equipment used: Rolling walker (2 wheeled) Transfers: Sit to/from Stand;Stand Pivot Transfers Sit to Stand: Mod assist Stand pivot transfers: Min assist       General transfer comment: Assist to rise, stabilize, control descent. VCs safety, hand placement. Stand pivot x2, bed<>bsc, with RW. Pt very unsteady. Fatigues quickly/easily.   Ambulation/Gait             General Gait Details: NT-deferred-pt unable to safely perform on today  Stairs            Wheelchair Mobility    Modified Rankin (Stroke Patients Only)        Balance Overall balance assessment: Needs assistance         Standing balance support: Bilateral upper extremity supported;During functional activity Standing balance-Leahy Scale: Poor                               Pertinent Vitals/Pain Pain Assessment: 0-10 Pain Score: 8  Pain Location: back, legs (chronic) Pain Descriptors / Indicators: Sore Pain Intervention(s): Monitored during session    Home Living Family/patient expects to be discharged to:: Private residence Living Arrangements: Children Available Help at Discharge: Family;Available PRN/intermittently Type of Home: Mobile home Home Access: Stairs to enter Entrance Stairs-Rails: Right;Left Entrance Stairs-Number of Steps: 4 Home Layout: One level Home Equipment: Walker - 2 wheels;Shower seat Additional Comments: pt reports daughter works    Prior Function Level of Independence: Independent with assistive device(s)         Comments: uses RW PRN     Hand Dominance   Dominant Hand: Right    Extremity/Trunk Assessment   Upper Extremity Assessment: Generalized weakness           Lower Extremity Assessment: Generalized weakness      Cervical / Trunk Assessment: Kyphotic  Communication   Communication: HOH  Cognition Arousal/Alertness: Awake/alert Behavior During Therapy: WFL for tasks assessed/performed Overall Cognitive Status: Within Functional Limits for tasks assessed                        General Comments      Exercises        Assessment/Plan    PT Assessment Patient needs continued PT services  PT Diagnosis Difficulty walking;Generalized weakness   PT Problem List Decreased strength;Decreased activity tolerance;Decreased balance;Pain;Decreased mobility;Decreased knowledge of use of DME;Cardiopulmonary status limiting activity  PT Treatment Interventions DME instruction;Gait training;Functional mobility training;Therapeutic activities;Patient/family  education;Balance training;Therapeutic exercise   PT Goals (Current goals can be found in the Care Plan section) Acute Rehab PT Goals Patient Stated Goal: none stated PT Goal Formulation: With patient Time For Goal Achievement: 05/17/15 Potential to Achieve Goals: Good    Frequency Min 3X/week   Barriers to discharge        Co-evaluation               End of Session   Activity Tolerance: Patient limited by fatigue Patient left: in bed;with call bell/phone within reach;with bed alarm set      Functional Assessment Tool Used: clinical judgement Functional Limitation: Mobility: Walking and moving around Mobility: Walking and Moving Around Current Status (G8978): At least 20 percent but less than 40 percent impaired, limited or restricted Mobility: Walking and Moving Around Goal Status (G8979): At least 1 percent but less than 20 percent impaired, limited or restricted    Time: 1406-1431 PT Time Calculation (min) (ACUTE ONLY): 25 min   Charges:   PT Evaluation $PT Eval Moderate Complexity: 1 Procedure PT Treatments $Therapeutic Activity: 8-22 mins   PT G Codes:   PT G-Codes **NOT FOR INPATIENT CLASS** Functional Assessment Tool Used: clinical judgement Functional Limitation: Mobility: Walking and moving around Mobility: Walking and Moving Around Current Status (G8978): At least 20 percent but less than 40 percent impaired, limited or restricted Mobility: Walking and Moving Around Goal Status (G8979): At least 1 percent but less than 20 percent impaired, limited or restricted    Jannie Porter, MPT Pager: [phone]   

## 2015-05-03 NOTE — NC FL2 (Signed)
[place] MEDICAID FL2 LEVEL OF CARE SCREENING TOOL     IDENTIFICATION  Patient Name: [name] Birthdate: 1931/06/26 Sex: female Admission Date (Current Location): 05/02/2015  [facility] and [place] Number:  [place] and Address:  [facility],  [place] [address], [address]      Provider Number: [id number]  Attending Physician Name and Address:  Theodis Blaze, MD  Relative Name and Phone Number:       Current Level of Care: Hospital Recommended Level of Care: [place] Prior Approval Number:    Date Approved/Denied:   PASRR Number: [id number]  Discharge Plan: SNF    Current Diagnoses: Patient Active Problem List   Diagnosis Date Noted  . Hypoxia 05/02/2015  . Diverticulitis 03/12/2015  . Vomiting 03/12/2015  . Nausea vomiting and diarrhea 02/20/2015  . Diarrhea 02/20/2015  . Paroxysmal atrial fibrillation ([place]) 10/30/2014  . Pancytopenia due to antineoplastic chemotherapy ([place]) 10/09/2014  . A-fib, CHA2DS2-VASc score 7 10/09/2014  . TIA (transient ischemic attack) 09/12/2014  . Hemispheric carotid artery syndrome   . Sick sinus syndrome ([place]) 09/10/2014  . Transient left leg weakness 09/10/2014  . Acute-on-chronic kidney injury ([place]) 09/10/2014  . Bradycardia 09/10/2014  . Anemia in neoplastic disease   . Leg weakness   . Stroke ([place])   . Multiple myeloma ([place]) 09/07/2014  . Anemia in other chronic diseases classified elsewhere   . Plasma cell disorder   . Dizziness   . Protein-calorie malnutrition, severe ([place]) 05/21/2014  . Chest pain 05/20/2014  . Chest pressure 05/20/2014  . Fatigue 03/09/2014  . CAD (coronary artery disease) of artery bypass graft, atretic LIMA 02/10/2014  . Chest pain with moderate risk of acute coronary syndrome 02/08/2014  . COPD (chronic obstructive pulmonary disease) ([place])   . Hypertension   . Chronic bronchitis ([place])   . Dyslipidemia 02/05/2012  . GERD (gastroesophageal  reflux disease) 02/05/2012    Orientation RESPIRATION BLADDER Height & Weight    Self, Time, Situation, Place  O2 (2L) Continent '5\' 2"'$  (157.5 cm) 122 lbs.  BEHAVIORAL SYMPTOMS/MOOD NEUROLOGICAL BOWEL NUTRITION STATUS      Continent Diet (Heart healthy)  AMBULATORY STATUS COMMUNICATION OF NEEDS Skin   Limited Assist Verbally Normal                       Personal Care Assistance Level of Assistance  Bathing, Dressing Bathing Assistance: Limited assistance   Dressing Assistance: Limited assistance     Functional Limitations Info             SPECIAL CARE FACTORS FREQUENCY  PT (By licensed PT), OT (By licensed OT)     PT Frequency: 5 X weekly OT Frequency: 5 X weekly            Contractures Contractures Info: Present    Additional Factors Info  Code Status, Allergies Code Status Info: FULL Allergies Info: Lexapro, Soma, Wellbutrin, Albuterol Sulfate, Codeine, Levaquin, Plavix, Statins           Current Medications (05/03/2015):  This is the current hospital active medication list Current Facility-Administered Medications  Medication Dose Route Frequency Provider Last Rate Last Dose  . 0.9 %  sodium chloride infusion   Intravenous Continuous Fredia Sorrow, MD 75 mL/hr at 05/02/15 2117    . 0.9 %  sodium chloride infusion  250 mL Intravenous PRN Reubin Milan, MD      . acetaminophen (TYLENOL) tablet 650 mg  650  mg Oral Q6H PRN Reubin Milan, MD      . albuterol (PROVENTIL) (2.5 MG/3ML) 0.083% nebulizer solution 1.25 mg  1.25 mg Nebulization Q6H PRN Reubin Milan, MD      . amLODipine (NORVASC) tablet 10 mg  10 mg Oral Daily Reubin Milan, MD   10 mg at 05/03/15 1019  . aspirin tablet 325 mg  325 mg Oral Daily Reubin Milan, MD   325 mg at 05/03/15 1020  . cholecalciferol (VITAMIN D) tablet 1,000 Units  1,000 Units Oral q morning - 10a Reubin Milan, MD   1,000 Units at 05/03/15 1019  . clonazePAM (KLONOPIN) tablet 0.5 mg  0.5 mg  Oral BID PRN Reubin Milan, MD      . ezetimibe (ZETIA) tablet 10 mg  10 mg Oral QHS Reubin Milan, MD   10 mg at 05/03/15 0056  . feeding supplement (ENSURE ENLIVE) (ENSURE ENLIVE) liquid 237 mL  237 mL Oral TID BM Reubin Milan, MD   237 mL at 05/03/15 1020  . ipratropium (ATROVENT) nebulizer solution 0.5 mg  0.5 mg Nebulization Q6H PRN Reubin Milan, MD      . isosorbide mononitrate (IMDUR) 24 hr tablet 30 mg  30 mg Oral Daily Reubin Milan, MD   30 mg at 05/03/15 1019  . lidocaine (LIDODERM) 5 % 1 patch  1 patch Transdermal Daily PRN Reubin Milan, MD      . megestrol (MEGACE) 40 MG/ML suspension 400 mg  400 mg Oral BID Reubin Milan, MD   400 mg at 05/03/15 1020  . metoprolol succinate (TOPROL-XL) 24 hr tablet 25 mg  25 mg Oral Daily Reubin Milan, MD   25 mg at 05/03/15 1020  . naproxen sodium (ANAPROX) tablet 275-550 mg  275-550 mg Oral TID PRN Reubin Milan, MD      . nitroGLYCERIN (NITROSTAT) SL tablet 0.4 mg  0.4 mg Sublingual Q5 min PRN Reubin Milan, MD      . pantoprazole (PROTONIX) EC tablet 40 mg  40 mg Oral Daily Reubin Milan, MD   40 mg at 05/03/15 1020  . polyethylene glycol (MIRALAX / GLYCOLAX) packet 17 g  17 g Oral Daily PRN Reubin Milan, MD      . senna-docusate (Senokot-S) tablet 2 tablet  2 tablet Oral Daily PRN Reubin Milan, MD      . sodium chloride flush (NS) 0.9 % injection 3 mL  3 mL Intravenous Q12H Reubin Milan, MD   0 mL at 05/03/15 0100  . sodium chloride flush (NS) 0.9 % injection 3 mL  3 mL Intravenous Q12H Reubin Milan, MD   3 mL at 05/03/15 0100  . sodium chloride flush (NS) 0.9 % injection 3 mL  3 mL Intravenous PRN Reubin Milan, MD      . tiotropium [facility]) inhalation capsule 18 mcg  18 mcg Inhalation Daily PRN Reubin Milan, MD      . traMADol Veatrice Bourbon) tablet 50 mg  50 mg Oral Q6H PRN Reubin Milan, MD   50 mg at 05/03/15 1034     Discharge  Medications: Please see discharge summary for a list of discharge medications.  Relevant Imaging Results:  Relevant Lab Results:   Additional Information SSN: [id number]. Pt takes oral chemo- megace.  Linna Darner, LCSW

## 2015-05-04 ENCOUNTER — Encounter (HOSPITAL_COMMUNITY): Payer: Self-pay

## 2015-05-04 ENCOUNTER — Ambulatory Visit: Payer: Medicare Other | Admitting: Neurology

## 2015-05-04 DIAGNOSIS — N289 Disorder of kidney and ureter, unspecified: Secondary | ICD-10-CM

## 2015-05-04 DIAGNOSIS — R001 Bradycardia, unspecified: Secondary | ICD-10-CM | POA: Diagnosis not present

## 2015-05-04 DIAGNOSIS — D6959 Other secondary thrombocytopenia: Secondary | ICD-10-CM

## 2015-05-04 DIAGNOSIS — D701 Agranulocytosis secondary to cancer chemotherapy: Secondary | ICD-10-CM | POA: Diagnosis not present

## 2015-05-04 DIAGNOSIS — D630 Anemia in neoplastic disease: Secondary | ICD-10-CM | POA: Diagnosis not present

## 2015-05-04 DIAGNOSIS — R0902 Hypoxemia: Secondary | ICD-10-CM | POA: Diagnosis not present

## 2015-05-04 DIAGNOSIS — C9000 Multiple myeloma not having achieved remission: Secondary | ICD-10-CM | POA: Diagnosis not present

## 2015-05-04 DIAGNOSIS — N179 Acute kidney failure, unspecified: Secondary | ICD-10-CM | POA: Diagnosis not present

## 2015-05-04 DIAGNOSIS — D61818 Other pancytopenia: Secondary | ICD-10-CM

## 2015-05-04 LAB — CBC
HCT: 27.9 % — ABNORMAL LOW (ref 36.0–46.0)
Hemoglobin: 8.9 g/dL — ABNORMAL LOW (ref 12.0–15.0)
MCH: 30.3 pg (ref 26.0–34.0)
MCHC: 31.9 g/dL (ref 30.0–36.0)
MCV: 94.9 fL (ref 78.0–100.0)
Platelets: 66 K/uL — ABNORMAL LOW (ref 150–400)
RBC: 2.94 MIL/uL — ABNORMAL LOW (ref 3.87–5.11)
RDW: 17.9 % — ABNORMAL HIGH (ref 11.5–15.5)
WBC: 1.4 K/uL — CL (ref 4.0–10.5)

## 2015-05-04 LAB — BASIC METABOLIC PANEL
Anion gap: 5 (ref 5–15)
BUN: 30 mg/dL — AB (ref 6–20)
CHLORIDE: 105 mmol/L (ref 101–111)
CO2: 23 mmol/L (ref 22–32)
CREATININE: 1.26 mg/dL — AB (ref 0.44–1.00)
Calcium: 7.7 mg/dL — ABNORMAL LOW (ref 8.9–10.3)
GFR calc Af Amer: 45 mL/min — ABNORMAL LOW (ref >60)
GFR calc non Af Amer: 39 mL/min — ABNORMAL LOW (ref >60)
Glucose, Bld: 111 mg/dL — ABNORMAL HIGH (ref 65–99)
Potassium: 4 mmol/L (ref 3.5–5.1)
Sodium: 133 mmol/L — ABNORMAL LOW (ref 135–145)

## 2015-05-04 MED ORDER — MAGNESIUM SULFATE 2 GM/50ML IV SOLN
2.0000 g | Freq: Once | INTRAVENOUS | Status: AC
  Administered 2015-05-04: 2 g via INTRAVENOUS
  Filled 2015-05-04: qty 50

## 2015-05-04 MED ORDER — BOOST / RESOURCE BREEZE PO LIQD
1.0000 | Freq: Three times a day (TID) | ORAL | Status: DC
  Administered 2015-05-04 – 2015-05-05 (×2): 1 via ORAL

## 2015-05-04 MED ORDER — METOPROLOL SUCCINATE 12.5 MG HALF TABLET
12.5000 mg | ORAL_TABLET | Freq: Every day | ORAL | Status: DC
  Administered 2015-05-05: 12.5 mg via ORAL
  Filled 2015-05-04 (×3): qty 1

## 2015-05-04 NOTE — Evaluation (Signed)
Occupational Therapy Evaluation Patient Details Name: [name] MRN: [id number] DOB: 03/04/1932 Today's Date: 05/04/2015    History of Present Illness 82 yo female admitted with hypoxia. Hx of multiple myeloma, COPD, HTN, MI, chronic back pain, gout, A fib, anemia.    Clinical Impression   Pt admitted as above, & is currently Mod A LB ADL's and Min A functional mobility transfers related to ADL's w/ c/o chronic back pain and generalized weakness. She should benefit from acute OT to assist with maximizing independence for ADL's, recommend ST Rehab/SNF secondary to daughter works during the day and pt home alone.    Follow Up Recommendations  SNF;Supervision/Assistance - 24 hour    Equipment Recommendations  None recommended by OT;Other (comment) (Defer to next venue)    Recommendations for Other Services       Precautions / Restrictions Precautions Precautions: Fall;Other (comment) Precaution Comments: Contact; monitor sats Restrictions Weight Bearing Restrictions: No      Mobility Bed Mobility Overal bed mobility: Needs Assistance Bed Mobility: Supine to Sit;Sit to Supine     Supine to sit: Min assist     General bed mobility comments: Assist to get to EOB. Increased time. Pt c/o dizziness  Transfers Overall transfer level: Needs assistance Equipment used: Rolling walker (2 wheeled) Transfers: Sit to/from [employer] Sit to Stand: Min assist Stand pivot transfers: Min assist       General transfer comment: Assist to rise, stabilize, control descent. VCs safety, hand placement. Stand pivot x2, bed<>bsc, with RW and then several steps to recliner chair. Pt unsteady. Fatigues quickly/easily.     Balance Overall balance assessment: Needs assistance Sitting-balance support: Bilateral upper extremity supported;Feet supported Sitting balance-Leahy Scale: Fair     Standing balance support: Bilateral upper extremity supported;During functional  activity Standing balance-Leahy Scale: Poor                              ADL Overall ADL's : Needs assistance/impaired Eating/Feeding: Modified independent;Bed level   Grooming: Wash/dry hands;Modified independent;Sitting   Upper Body Bathing: Minimal assitance;Sitting   Lower Body Bathing: Moderate assistance;Sitting/lateral leans;Sit to/from stand   Upper Body Dressing : Set up;Supervision/safety;Sitting   Lower Body Dressing: Moderate assistance;Sitting/lateral leans;Sit to/from stand;Cueing for safety;Cueing for sequencing   Toilet Transfer: Minimal assistance;BSC;RW;Ambulation;Cueing for safety;Cueing for sequencing   Toileting- Clothing Manipulation and Hygiene: Minimal assistance;Sit to/from stand;Cueing for safety;Cueing for sequencing       Functional mobility during ADLs: Minimal assistance;Rolling walker;Cueing for safety;Cueing for sequencing General ADL Comments: Pt was educated in Role of OT. She is currently Mod A LB ADL's and Min A functional mobility transfers related to ADL's w/ c/o chronic back pain and generalized weakness. She should benefit from acute OT to assist with maximizing independence, recommend ST Rehab at SNF secondary to daughter works during the day and pt home alone.     Vision  Wears glasses, No change from baseline.   Perception     Praxis      Pertinent Vitals/Pain Pain Assessment: 0-10 Pain Score: 8  Pain Location: Low back pain Pain Descriptors / Indicators: Constant Pain Intervention(s): Limited activity within patient's tolerance;Monitored during session;Repositioned;RN gave pain meds during session     Hand Dominance Right   Extremity/Trunk Assessment Upper Extremity Assessment Upper Extremity Assessment: Generalized weakness   Lower Extremity Assessment Lower Extremity Assessment: Generalized weakness   Cervical / Trunk Assessment Cervical / Trunk Assessment: Kyphotic   Communication  Communication Communication: HOH   Cognition Arousal/Alertness: Awake/alert Behavior During Therapy: WFL for tasks assessed/performed Overall Cognitive Status: Within Functional Limits for tasks assessed                     General Comments       Exercises       Shoulder Instructions      Home Living Family/patient expects to be discharged to:: Private residence Living Arrangements: Children Available Help at Discharge: Family;Available PRN/intermittently Type of Home: Mobile home Home Access: Stairs to enter Entrance Stairs-Number of Steps: 4 Entrance Stairs-Rails: Right;Left Home Layout: One level     Bathroom Shower/Tub: Tub/shower unit;Walk-in shower   Bathroom Toilet: Standard     Home Equipment: Environmental consultant - 2 wheels;Shower seat   Additional Comments: pt reports daughter works      Prior Functioning/Environment Level of Independence: Independent with assistive device(s)        Comments: uses RW PRN    OT Diagnosis: Generalized weakness   OT Problem List: Decreased strength;Decreased activity tolerance;Decreased knowledge of use of DME or AE;Pain   OT Treatment/Interventions: Self-care/ADL training;DME and/or AE instruction;Patient/family education;Therapeutic activities;Therapeutic exercise;Energy conservation    OT Goals(Current goals can be found in the care plan section) Acute Rehab OT Goals Patient Stated Goal: Feel better Time For Goal Achievement: 05/18/15 Potential to Achieve Goals: Good ADL Goals Pt Will Perform Grooming: with modified independence;sitting Pt Will Perform Lower Body Bathing: with supervision;with adaptive equipment;sitting/lateral leans;sit to/from stand Pt Will Perform Lower Body Dressing: with supervision;with adaptive equipment;sitting/lateral leans;sit to/from stand Pt Will Transfer to Toilet: with supervision;stand pivot transfer;ambulating;bedside commode Pt Will Perform Toileting - Clothing Manipulation and hygiene:  with supervision;with adaptive equipment;sitting/lateral leans;sit to/from stand Pt Will Perform Tub/Shower Transfer: Shower transfer;with min assist;ambulating;shower seat;rolling walker;with caregiver independent in assisting  OT Frequency: Min 2X/week   Barriers to D/C: Decreased caregiver support  Daughter works during the day.       Co-evaluation              End of Session Equipment Utilized During Treatment: Gait belt;Rolling walker;Oxygen Nurse Communication: Mobility status  Activity Tolerance: Patient limited by fatigue Patient left: in chair;with call bell/phone within reach;with chair alarm set   Time: [phone] OT Time Calculation (min): 19 min Charges:  OT General Charges $OT Visit: 1 Procedure OT Evaluation $OT Eval Moderate Complexity: 1 Procedure G-Codes: OT G-codes **NOT FOR INPATIENT CLASS** Functional Assessment Tool Used: Clinical judgement Functional Limitation: Self care Self Care Current Status (E9407): At least 40 percent but less than 60 percent impaired, limited or restricted Self Care Goal Status (W8088): At least 1 percent but less than 20 percent impaired, limited or restricted  Almyra Deforest, OTR/L 05/04/2015, 10:30 AM

## 2015-05-04 NOTE — Progress Notes (Signed)
Patient ID: [name], female   DOB: 1931-08-13, 82 y.o.   MRN: [id number] [facility] PROGRESS NOTE  [name] HYQ:[id number] DOB: 14-Jan-1932 DOA: 05/02/2015 PCP: [name], MD   Brief narrative:    82 y.o. female with known COPD, gout, CAD, hypertension, hyperlipidemia, multiple myeloma who was recently started on a new oral chemotherapeutic agent and has presented to the emergency department due to progressively worse weakness and decreased appetite and oral intake for the past 2 days. She is chronically anemic and has received a blood transfusion about a week ago.   In the ER, incidentally the patient was found to be hypoxic in the mid 80s, but in no acute distress. Her O2 sats improved to the low 90s with 2 L/m [address]. She is supposed to have oxygen at home, but has not used it in a while and the equipment seems to be in no working condition. She was admitted for further evaluation.   Assessment/Plan:    Principal Problem:   Hypoxia - unclear etiology, CXR with no acute findings  - pt reports feeling better this AM, oxygen saturation is stable for now but pt still requiring O2 via [place] - discussed with pt CT chest and she would like to hold off on it for now - will likely need oxygen upon discharge   Active Problems:   Multiple myeloma ([place]) - appreciate Dr Alen Blew assistance - he recommended stopping Revlimid for now    A-fib, CHA2DS2-VASc score 7 - not an AC candidate due to high risk bleed  - due to brady, keep on tele     Acute kidney injury superimposed on chronic kidney disease, stage II ([place]) - IVF have been provided and Cr trending down  - BMP in AM    Weakness of both lower extremities - secondary to progressive FTT, MM - PT/OT evaluation requested      Hypomagnesemia - supplement and repeat Mg level in AM    COPD (chronic obstructive pulmonary disease) ([facility]) - no wheezing on exam this AM but certainly possible that it contributes to hypoxia -  provide BD as needed     Pancytopenia due to antineoplastic chemotherapy ([place]) - hold on transfusions for now - CBC in AM    GERD (gastroesophageal reflux disease) - no gastritis signs - continue PPI    CAD (coronary artery disease) of artery bypass graft, atretic LIMA - asymptomatic     Protein-calorie malnutrition, severe ([place]) - nutritionist consulted     Bradycardia - asymptomatic - lower the dose of Metoprolol from 24 mg to 12.5 due to bradycardia     Hyponatremia - stable   DVT prophylaxis - SCD's due to high risk bleed   Code Status: Full.  Family Communication:  plan of care discussed with the patient Disposition Plan: Home pending stable CBC and PT evaluation, possibly by 1/28    IV access:  Peripheral IV  Procedures and diagnostic studies:    Dg Chest 2 View 05/02/2015  Cardiomegaly without acute disease.   Medical Consultants:  Oncology dr. Alen Blew   Other Consultants:  PT OT  IAnti-Infectives:   None  Faye Ramsay, MD  [facility] Pager [phone]  If 7PM-7AM, please contact night-coverage [email or web address] Password [email or web address] 05/04/2015, 7:05 AM  HPI/Subjective: No events overnight.   Objective: Filed Vitals:   05/03/15 0605 05/03/15 1831 05/03/15 2032 05/04/15 0515  BP: 140/51 130/47 107/53 124/36  Pulse: 57 57 53 53  Temp: 98.6 F (37 C) 98.8 F (37.1 C)  98.5 F (36.9 C) 97.6 F (36.4 C)  TempSrc: Oral Oral Oral Oral  Resp: _0 Height:      Weight:      SpO2: 95% 94% 93% 96%    Intake/Output Summary (Last 24 hours) at 05/04/15 0705 Last data filed at 05/04/15 0313  Gross per 24 hour  Intake    360 ml  Output    850 ml  Net   -490 ml    Exam:   General:  Pt is alert, follows commands appropriately, not in acute distress  Cardiovascular: Regular rhythm, bradycardia, no murmurs, no rubs, no gallops  Respiratory: Clear to auscultation bilaterally, no wheezing, no crackles, no rhonchi  Abdomen: Soft, non tender, non  distended, bowel sounds present, no guarding  Data Reviewed: Basic Metabolic Panel:  Recent Labs Lab 05/02/15 2014 05/03/15 0520  NA 134* 134*  K 4.2 4.0  CL 104 104  CO2 24 24  GLUCOSE 96 101*  BUN 31* 29*  CREATININE 1.26* 1.08*  CALCIUM 9.0 8.0*  MG  --  1.6*  PHOS  --  5.3*   Liver Function Tests:  Recent Labs Lab 05/02/15 2014 05/03/15 0520  AST 22 23  ALT 12* 11*  ALKPHOS 53 49  BILITOT 0.3 0.7  PROT 9.4* 8.4*  ALBUMIN 3.4* 3.0*    Recent Labs Lab 05/02/15 2014  LIPASE 44   CBC:  Recent Labs Lab 05/02/15 2014 05/03/15 0520 05/04/15 0557  WBC 1.5* 1.3* 1.4*  NEUTROABS 0.7* 0.6*  --   HGB 9.2* 8.7* 8.9*  HCT 28.0* 27.5* 27.9*  MCV 91.8 94.8 94.9  PLT 75* 70* 66*   Scheduled Meds: . amLODipine  10 mg Oral Daily  . aspirin  325 mg Oral Daily  . cholecalciferol  1,000 Units Oral q morning - 10a  . ezetimibe  10 mg Oral QHS  . feeding supplement (ENSURE ENLIVE)  237 mL Oral TID BM  . isosorbide mononitrate  30 mg Oral Daily  . megestrol  400 mg Oral BID  . metoprolol succinate  25 mg Oral Daily  . pantoprazole  40 mg Oral Daily  . sodium chloride flush  3 mL Intravenous Q12H  . sodium chloride flush  3 mL Intravenous Q12H   Continuous Infusions: . sodium chloride 75 mL/hr at 05/02/15 2117

## 2015-05-04 NOTE — Progress Notes (Signed)
Initial Nutrition Assessment  DOCUMENTATION CODES:   Non-severe (moderate) malnutrition in context of acute illness/injury  INTERVENTION:  -d/c Ensure Enlive TID -Provide Boost Breeze TID, 250 kcal, 9 grams of protein -RD will continue to monitor for nutritional needs  NUTRITION DIAGNOSIS:   Inadequate oral intake related to poor appetite (fluctuating appetite) as evidenced by per patient/family report.  GOAL:   Patient will meet greater than or equal to 90% of their needs  MONITOR:   PO intake, Supplement acceptance, Labs, Weight trends, I & O's  REASON FOR ASSESSMENT:   Consult Assessment of nutrition requirement/status  ASSESSMENT:   83 y.o. female with known COPD, gout, CAD, hypertension, hyperlipidemia, multiple myeloma who was recently started on a new oral chemotherapeutic agent and has presented to the emergency department due to progressively worse weakness and decreased appetite and oral intake for the past 2 days. She is chronically anemic and has received a blood transfusion about a week ago.   Pt seen for consult, nutrition assessment. Pt states that her appetite was good today. Per family member states pt appetite fluctuates from extreme ends daily. Pt is prescribed Megace. Pt reports that she had a late breakfast. Following the breakfast she had an ensure. During time of visit pt was eating lunch and reports that she was not hungry because she was full from the ensure. Pt reports drinking ensure PTA, but states she drinks very few of them. Pt states the ensures taste "okay". Pt is willing to try boost breeze.   Pt denies any chewing or swallowing difficulties. Pt does not have bottom dentures. Pt avoids tough meats such as pork. Pt tries to eat softer foods. Pt denies any taste alterations or sensitivities to temperature.   Per pt family member Pt has a history of diverticulitis, family member wanted better clarification of diverticulitis. RD educated family on  difference between diverticulitis and diverticulosis. Per pt's daughter, pt is currently not on any chemotherapy medications. Visualized that pt was struggling with feeding herself but pt's daughter states she would like her mother to maintain independence as much as possible.   Unable to conduct nutrition focused physical exam, will at follow up. Per pt's daughter pt has lost 11 lb in one month. Per chart review pt lost 11 lb (9%) in 4 months, which is significant for time frame.   Reviewed medications; Megace. Reviewed labs: Na 133 mmol/L, BUN and creatinine elevated and trending up, Ca 7.7 mmol/L  Diet Order:  Diet regular Room service appropriate?: Yes; Fluid consistency:: Thin  Skin:  Reviewed, no issues  Last BM:  1/23  Height:   Ht Readings from Last 1 Encounters:  05/03/15 5' 2" (1.575 m)    Weight:   Wt Readings from Last 1 Encounters:  05/03/15 122 lb 9.2 oz (55.6 kg)    Ideal Body Weight:  50 kg  BMI:  Body mass index is 22.41 kg/(m^2).  Estimated Nutritional Needs:   Kcal:  1650-1850  Protein:  85-95 grams  Fluid:  1.6-1.8 L/day  EDUCATION NEEDS:   No education needs identified at this time  Brittany Kern, Dietetic Intern Pager: [phone] 

## 2015-05-04 NOTE — Clinical Social Work Note (Addendum)
CSW met with pt and daughter at bedside to present bed offers. Pt was accepted at [facility] which she's agreeable too but would prefer [facility] or [facility]. CSW spoke to [place] at [facility] who will review pt's paperwork; there is a bed available tomorrow. CSW spoke to [place] at [facility] who states pt was most likely declined due to oral chemo Runell Gess has since been discontinued], but there are no beds available currently.    Cindra Presume, LCSW [phone] Hospital psychiatric & 5E, 5W [id number] Licensed Clinical Social Worker

## 2015-05-04 NOTE — Progress Notes (Signed)
IP PROGRESS NOTE  Subjective:   Patient well-known to me with a history of multiple myeloma currently on Revlimid and dexamethasone. She was hospitalized on 05/02/2015 with hypoxia and failure to thrive. Since her hospitalization, she was started on oxygen which have helped her symptoms. He is able to better and feels much improved. She wasn't noted to be pancytopenic however.  She denied any fevers or chills or sweats. He does not report any cough or hemoptysis. She has not reported any epistaxis or hematochezia. He does not report any abdominal pain or distention. He does not report any skeletal complaints. She does report diffuse pain which have not changed in nature. Remaining review of systems unremarkable.  Objective:  Vital signs in last 24 hours: Temp:  [97.6 F (36.4 C)-98.8 F (37.1 C)] 97.6 F (36.4 C) (01/27 0515) Pulse Rate:  [53-57] 53 (01/27 0515) Resp:  [18-20] 20 (01/27 0515) BP: (107-130)/(36-53) 124/36 mmHg (01/27 0515) SpO2:  [93 %-96 %] 96 % (01/27 0515) Weight change:  Last BM Date: 04/30/15  Intake/Output from previous day: 01/26 0701 - 01/27 0700 In: 360 [P.O.:360] Out: 850 [Urine:850] Alert, chronically appearing woman without distress. Mouth: mucous membranes moist, pharynx normal without lesions Resp: clear to auscultation bilaterally Cardio: irregularly irregular rhythm GI: soft, non-tender; bowel sounds normal; no masses,  no organomegaly Extremities: extremities normal, atraumatic, no cyanosis or edema   Lab Results:  Recent Labs  05/03/15 0520 05/04/15 0557  WBC 1.3* 1.4*  HGB 8.7* 8.9*  HCT 27.5* 27.9*  PLT 70* 66*    BMET  Recent Labs  05/03/15 0520 05/04/15 0557  NA 134* 133*  K 4.0 4.0  CL 104 105  CO2 24 23  GLUCOSE 101* 111*  BUN 29* 30*  CREATININE 1.08* 1.26*  CALCIUM 8.0* 7.7*    Studies/Results: Dg Chest 2 View  05/02/2015  CLINICAL DATA:  History of multiple myeloma. Decreased oral intake in urine output for 1  day. Initial encounter. EXAM: CHEST  2 VIEW COMPARISON:  PA and lateral chest 02/20/2015. FINDINGS: There is cardiomegaly without edema. No consolidative process, pneumothorax or effusion is identified. The patient is status post median sternotomy. No fracture is seen. IMPRESSION: Cardiomegaly without acute disease. Electronically Signed   By: Inge Rise M.D.   On: 05/02/2015 20:23    Medications: I have reviewed the patient's current medications.  Assessment/Plan:  82 year old woman with the following issues:  1. Multiple myeloma: She is currently on Revlimid and dexamethasone. I recommend to stop the Revlimid for the time being given her pancytopenia. This will be resumed as an outpatient after further hematological evaluation and repeat CBC.  2. Hypoxia: She appears to be doing better with oxygen supplementation. She'll likely need home oxygen upon discharge. He does have underlying lung disease that is likely contributing.  3. Renal insufficiency: Likely related to dehydration and chronic kidney disease.  4. Neutropenia: Related to Revlimid therapy which will be on hold moving forward. She has no signs or symptoms of sepsis at this time. She does not need any growth factor support.  5. Thrombocytopenia: No active bleeding noted at this time. This is likely related to acute illness and Revlimid.  6. Disposition: I have no objection to discharge once it was felt that she is medically stable from medical standpoint. She has a follow-up already set up at the [facility] next week.     Sidni Fusco 05/04/2015, 8:00 AM

## 2015-05-05 DIAGNOSIS — E871 Hypo-osmolality and hyponatremia: Secondary | ICD-10-CM

## 2015-05-05 DIAGNOSIS — I4891 Unspecified atrial fibrillation: Secondary | ICD-10-CM | POA: Diagnosis not present

## 2015-05-05 DIAGNOSIS — K219 Gastro-esophageal reflux disease without esophagitis: Secondary | ICD-10-CM | POA: Diagnosis not present

## 2015-05-05 DIAGNOSIS — C9000 Multiple myeloma not having achieved remission: Secondary | ICD-10-CM

## 2015-05-05 DIAGNOSIS — I482 Chronic atrial fibrillation: Secondary | ICD-10-CM

## 2015-05-05 DIAGNOSIS — E440 Moderate protein-calorie malnutrition: Secondary | ICD-10-CM | POA: Insufficient documentation

## 2015-05-05 DIAGNOSIS — D630 Anemia in neoplastic disease: Secondary | ICD-10-CM | POA: Diagnosis not present

## 2015-05-05 DIAGNOSIS — N179 Acute kidney failure, unspecified: Secondary | ICD-10-CM | POA: Diagnosis not present

## 2015-05-05 LAB — BASIC METABOLIC PANEL
Anion gap: 5 (ref 5–15)
BUN: 28 mg/dL — AB (ref 6–20)
CHLORIDE: 108 mmol/L (ref 101–111)
CO2: 23 mmol/L (ref 22–32)
CREATININE: 1.02 mg/dL — AB (ref 0.44–1.00)
Calcium: 7.8 mg/dL — ABNORMAL LOW (ref 8.9–10.3)
GFR calc Af Amer: 58 mL/min — ABNORMAL LOW (ref >60)
GFR calc non Af Amer: 50 mL/min — ABNORMAL LOW (ref >60)
Glucose, Bld: 127 mg/dL — ABNORMAL HIGH (ref 65–99)
POTASSIUM: 4.5 mmol/L (ref 3.5–5.1)
Sodium: 136 mmol/L (ref 135–145)

## 2015-05-05 LAB — CBC
HEMATOCRIT: 27.9 % — AB (ref 36.0–46.0)
HEMOGLOBIN: 9 g/dL — AB (ref 12.0–15.0)
MCH: 29.7 pg (ref 26.0–34.0)
MCHC: 32.3 g/dL (ref 30.0–36.0)
MCV: 92.1 fL (ref 78.0–100.0)
Platelets: 55 10*3/uL — ABNORMAL LOW (ref 150–400)
RBC: 3.03 MIL/uL — ABNORMAL LOW (ref 3.87–5.11)
RDW: 17.7 % — ABNORMAL HIGH (ref 11.5–15.5)
WBC: 2.2 10*3/uL — ABNORMAL LOW (ref 4.0–10.5)

## 2015-05-05 LAB — MAGNESIUM: Magnesium: 2 mg/dL (ref 1.7–2.4)

## 2015-05-05 MED ORDER — BISACODYL 10 MG RE SUPP
10.0000 mg | Freq: Once | RECTAL | Status: AC
  Administered 2015-05-05: 10 mg via RECTAL
  Filled 2015-05-05: qty 1

## 2015-05-05 NOTE — Progress Notes (Addendum)
Patient ID: [name], female   DOB: 02-26-1932, 82 y.o.   MRN: [id number] [facility] PROGRESS NOTE  RABAB CURRINGTON URK:[id number] DOB: January 20, 1932 DOA: 05/02/2015 PCP: [name], MD  Brief narrative:    82 y.o. female with known COPD, gout, CAD, hypertension, hyperlipidemia, multiple myeloma who was recently started on a new oral chemotherapeutic agent and has presented to the emergency department due to progressively worse weakness and decreased appetite and oral intake for the past 2 days. She is chronically anemic and has received a blood transfusion about a week prior to this admission.  In the ER, patient was found to be hypoxic in the mid 80's but in no acute distress. Her O2 sats improved to the low 90s with 2 L/m [place].  Assessment/Plan:    Principal Problem: Acute respiratory failure with hypoxia - CXR with no acute findings  - O2 sats in 97% - Try to wean from oxygen   Active Problems:  Multiple myeloma (HCC) - Dr Alen Blew recommended stopping Revlimid for now   A-fib, CHA2DS2-VASc score 7 - Not on AC due to risk of bleeding    Acute kidney injury superimposed on chronic kidney disease, stage II ([place]) Improving with IV fluids    Weakness of both lower extremities - In the context of chronic illness - Appreciate PT recommendations    Hypomagnesemia - Supplemented     Essential hypertension - Continue Norvasc 10 mg daily - Continue imdur and metoprolol       Dyslipidemia - Continue zetia    COPD (chronic obstructive pulmonary disease) (HCC) - Stable resp status    Pancytopenia due to antineoplastic chemotherapy (HCC) - Stable CBC  - Check CBC in am   GERD (gastroesophageal reflux disease) - Continue PPI therapy    CAD (coronary artery disease) of artery bypass graft, atretic LIMA - No chest pain    Protein-calorie malnutrition, moderate (HCC) - In the context of chronic illness - Diet as tolerated    Hyponatremia - Due to  dehydration - Improved with IV fluids    DVT Prophylaxis  - SCD's bilaterally   Code Status: Full.  Family Communication:  plan of care discussed with the patient Disposition Plan: to SNF likely 1/29  IV access:  Peripheral IV  Procedures and diagnostic studies:    Dg Chest 2 View 05/02/2015   Cardiomegaly without acute disease. Electronically Signed   By: Inge Rise M.D.   On: 05/02/2015 20:23    Medical Consultants:  Oncology,- Dr. Alen Blew  Other Consultants:  PT OT  IAnti-Infectives:   None    Leisa Lenz, MD  [facility] Pager [phone]  Time spent in minutes: 25 minutes  If 7PM-7AM, please contact night-coverage [email or web address] Password [facility] 05/05/2015, 2:32 PM      HPI/Subjective: No acute overnight events. Patient reports feeling weak.   Objective: Filed Vitals:   05/04/15 0900 05/04/15 1426 05/04/15 2146 05/05/15 0606  BP: 106/45 103/43 124/47 139/49  Pulse: 47 48 51 58  Temp: 97.5 F (36.4 C) 97.7 F (36.5 C) 97.5 F (36.4 C) 97.7 F (36.5 C)  TempSrc: Oral Oral Oral Oral  Resp: _0 Height:      Weight:      SpO2: 97% 97% 98% 99%    Intake/Output Summary (Last 24 hours) at 05/05/15 1432 Last data filed at 05/05/15 0849  Gross per 24 hour  Intake    360 ml  Output      0 ml  Net    360 ml    Exam:   General:  Pt is alert, follows commands appropriately, not in acute distress  Cardiovascular: Regular rate and rhythm, S1/S2, no murmurs  Respiratory: diminished , no wheezing   Abdomen: Soft, non tender, non distended, bowel sounds present  Extremities: No edema, pulses palpable bilaterally  Neuro: Grossly nonfocal  Data Reviewed: Basic Metabolic Panel:  Recent Labs Lab 05/02/15 2014 05/03/15 0520 05/04/15 0557 05/05/15 0519  NA 134* 134* 133* 136  K 4.2 4.0 4.0 4.5  CL 104 104 105 108  CO2 _0 GLUCOSE 96 101* 111* 127*  BUN 31* 29* 30* 28*  CREATININE 1.26* 1.08* 1.26* 1.02*  CALCIUM 9.0  8.0* 7.7* 7.8*  MG  --  1.6*  --  2.0  PHOS  --  5.3*  --   --    Liver Function Tests:  Recent Labs Lab 05/02/15 2014 05/03/15 0520  AST 22 23  ALT 12* 11*  ALKPHOS 53 49  BILITOT 0.3 0.7  PROT 9.4* 8.4*  ALBUMIN 3.4* 3.0*    Recent Labs Lab 05/02/15 2014  LIPASE 44   No results for input(s): AMMONIA in the last 168 hours. CBC:  Recent Labs Lab 05/02/15 2014 05/03/15 0520 05/04/15 0557 05/05/15 0519  WBC 1.5* 1.3* 1.4* 2.2*  NEUTROABS 0.7* 0.6*  --   --   HGB 9.2* 8.7* 8.9* 9.0*  HCT 28.0* 27.5* 27.9* 27.9*  MCV 91.8 94.8 94.9 92.1  PLT 75* 70* 66* 55*   Cardiac Enzymes: No results for input(s): CKTOTAL, CKMB, CKMBINDEX, TROPONINI in the last 168 hours. BNP: Invalid input(s): POCBNP CBG: No results for input(s): GLUCAP in the last 168 hours.  No results found for this or any previous visit (from the past 240 hour(s)).   Scheduled Meds: . amLODipine  10 mg Oral Daily  . aspirin  325 mg Oral Daily  . cholecalciferol  1,000 Units Oral q morning - 10a  . ezetimibe  10 mg Oral QHS  . feeding supplement  1 Container Oral TID BM  . isosorbide mononitrate  30 mg Oral Daily  . megestrol  400 mg Oral BID  . metoprolol succinate  12.5 mg Oral Daily  . pantoprazole  40 mg Oral Daily  . sodium chloride flush  3 mL Intravenous Q12H  . sodium chloride flush  3 mL Intravenous Q12H   Continuous Infusions: . sodium chloride 50 mL/hr at 05/04/15 1214

## 2015-05-06 DIAGNOSIS — K219 Gastro-esophageal reflux disease without esophagitis: Secondary | ICD-10-CM

## 2015-05-06 DIAGNOSIS — N179 Acute kidney failure, unspecified: Secondary | ICD-10-CM | POA: Diagnosis not present

## 2015-05-06 DIAGNOSIS — I4891 Unspecified atrial fibrillation: Secondary | ICD-10-CM

## 2015-05-06 DIAGNOSIS — D630 Anemia in neoplastic disease: Secondary | ICD-10-CM | POA: Diagnosis not present

## 2015-05-06 MED ORDER — PANTOPRAZOLE SODIUM 40 MG PO TBEC: 40.0000 mg | DELAYED_RELEASE_TABLET | Freq: Every day | ORAL | Status: AC

## 2015-05-06 NOTE — Discharge Summary (Addendum)
Physician Discharge Summary  [name]:[id number] DOB: 1932/03/21 DOA: 05/02/2015  PCP: Georgann Housekeeper, MD  Admit date: 05/02/2015 Discharge date: 05/06/2015  Recommendations for Outpatient Follow-up:  1. Check CBC and BMP per SNF protocol   Discharge Diagnoses:  Principal Problem:   Hypoxia Active Problems:   GERD (gastroesophageal reflux disease)   COPD (chronic obstructive pulmonary disease) (HCC)   CAD (coronary artery disease) of artery bypass graft, atretic LIMA   Protein-calorie malnutrition, severe (HCC)   Multiple myeloma (HCC)   Bradycardia   Anemia in neoplastic disease   Pancytopenia due to antineoplastic chemotherapy (HCC)   A-fib, CHA2DS2-VASc score 7   Acute kidney injury superimposed on chronic kidney disease, stage II (HCC)   Hyponatremia   Weakness of both lower extremities   Hypomagnesemia   Malnutrition of moderate degree    Discharge Condition: stable   Diet recommendation: as tolerated   History of present illness:  82 y.o. female with known COPD, gout, CAD, hypertension, hyperlipidemia, multiple myeloma who was recently started on a new oral chemotherapeutic agent and has presented to the emergency department due to progressively worse weakness and decreased appetite and oral intake for the past 2 days. She is chronically anemic and has received a blood transfusion about a week prior to this admission.  In the ER, patient was found to be hypoxic in the mid 80's but in no acute distress. Her O2 sats improved to the low 90s with 2 L/m [place].  Hospital Course: Principal Problem: Acute respiratory failure with hypoxia - CXR with no acute findings  - O2 sats in 97% - Use oxygen to keep O2 sats above 90%  Active Problems:  Multiple myeloma (HCC) - Dr Clelia Croft recommended stopping Revlimid for now   A-fib, CHA2DS2-VASc score 7 - Not on AC due to risk of bleeding    Acute kidney injury superimposed on chronic kidney disease, stage II  (HCC) - Improved with IV fluids    Weakness of both lower extremities - In the context of chronic illness   Hypomagnesemia - Supplemented    Essential hypertension - Continue Norvasc 10 mg daily - Continue imdur and metoprolol     Dyslipidemia - Continue zetia    COPD (chronic obstructive pulmonary disease) (HCC) - Stable resp status    Pancytopenia due to antineoplastic chemotherapy (HCC) - Stable CBC    GERD (gastroesophageal reflux disease) - Continue PPI therapy    CAD (coronary artery disease) of artery bypass graft, atretic LIMA - No chest pain    Protein-calorie malnutrition, moderate (HCC) - In the context of chronic illness - Diet as tolerated    Hyponatremia - Due to dehydration - Improved with IV fluids    DVT Prophylaxis  - SCD's bilaterally in hospital   Code Status: Full.  Family Communication: plan of care discussed with the patient   IV access:  Peripheral IV  Procedures and diagnostic studies:   Dg Chest 2 View 05/02/2015 Cardiomegaly without acute disease. Electronically Signed By: Drusilla Kanner M.D. On: 05/02/2015 20:23    Medical Consultants:  Oncology,- Dr. Clelia Croft  Other Consultants:  PT OT  IAnti-Infectives:   None       Signed:  Manson Passey, MD  [facility] 05/06/2015, 10:22 AM  Pager #: [phone]  Time spent in minutes: more than 30 minutes   Discharge Exam: Filed Vitals:   05/05/15 2105 05/06/15 0500  BP: 130/48 128/70  Pulse: 47 46  Temp: 97.8 F (36.6 C) 97.9 F (36.6 C)  Resp: 18 18   Filed Vitals:   05/05/15 0606 05/05/15 1400 05/05/15 2105 05/06/15 0500  BP: 139/49 121/49 130/48 128/70  Pulse: 58 55 47 46  Temp: 97.7 F (36.5 C) 97.9 F (36.6 C) 97.8 F (36.6 C) 97.9 F (36.6 C)  TempSrc: Oral Oral Oral Oral  Resp: '18  18 18  '$ Height:      Weight:      SpO2: 99% 100% 97% 98%    General: Pt is alert, follows commands appropriately, not in  acute distress Cardiovascular: Regular rate and rhythm, S1/S2 +, no murmurs Respiratory: Clear to auscultation bilaterally, no wheezing, no crackles, no rhonchi Abdominal: Soft, non tender, non distended, bowel sounds +, no guarding Extremities: no edema, no cyanosis, pulses palpable bilaterally DP and PT Neuro: Grossly nonfocal  Discharge Instructions  Discharge Instructions    Call MD for:  difficulty breathing, headache or visual disturbances    Complete by:  As directed      Call MD for:  persistant dizziness or light-headedness    Complete by:  As directed      Call MD for:  persistant nausea and vomiting    Complete by:  As directed      Call MD for:  severe uncontrolled pain    Complete by:  As directed      Diet - low sodium heart healthy    Complete by:  As directed      Increase activity slowly    Complete by:  As directed             Medication List    STOP taking these medications        acyclovir 400 MG tablet  Commonly known as:  ZOVIRAX     clonazePAM 0.5 MG tablet  Commonly known as:  KLONOPIN     dexamethasone 4 MG tablet  Commonly known as:  DECADRON     HYDROmorphone 4 MG tablet  Commonly known as:  DILAUDID     lenalidomide 10 MG capsule  Commonly known as:  REVLIMID     Lidocaine 4 % Ptch     metroNIDAZOLE 500 MG tablet  Commonly known as:  FLAGYL     naproxen sodium 220 MG tablet  Commonly known as:  ANAPROX     OVER THE COUNTER MEDICATION     oxyCODONE-acetaminophen 5-325 MG tablet  Commonly known as:  PERCOCET/ROXICET     prochlorperazine 10 MG tablet  Commonly known as:  COMPAZINE     traMADol 50 MG tablet  Commonly known as:  ULTRAM      TAKE these medications        acetaminophen 325 MG tablet  Commonly known as:  TYLENOL  Take 2 tablets (650 mg total) by mouth every 6 (six) hours as needed for mild pain (or Fever >/= 101).     albuterol-ipratropium 18-103 MCG/ACT inhaler  Commonly known as:  COMBIVENT  Inhale 1-2  puffs into the lungs every 4 (four) hours as needed for wheezing or shortness of breath.     amLODipine 10 MG tablet  Commonly known as:  NORVASC  Take 1 tablet (10 mg total) by mouth daily.     aspirin 325 MG tablet  Take 325 mg by mouth daily.     cholecalciferol 1000 units tablet  Commonly known as:  VITAMIN D  Take 1,000 Units by mouth every morning.     ezetimibe 10 MG tablet  Commonly known as:  ZETIA  Take 1 tablet (10 mg total) by mouth at bedtime.     feeding supplement (ENSURE ENLIVE) Liqd  Take 237 mLs by mouth 3 (three) times daily between meals.     isosorbide mononitrate 30 MG 24 hr tablet  Commonly known as:  IMDUR  Take 1 tablet (30 mg total) by mouth daily.     megestrol 40 MG/ML suspension  Commonly known as:  MEGACE ORAL  Take 10 mLs (400 mg total) by mouth 2 (two) times daily.     metoprolol succinate 25 MG 24 hr tablet  Commonly known as:  TOPROL-XL  Take 1 tablet (25 mg total) by mouth daily.     nitroGLYCERIN 0.4 MG SL tablet  Commonly known as:  NITROSTAT  Place 0.4 mg under the tongue every 5 (five) minutes as needed for chest pain. Reported on 04/20/2015     pantoprazole 40 MG tablet  Commonly known as:  PROTONIX  Take 1 tablet (40 mg total) by mouth daily.     polyethylene glycol powder powder  Commonly known as:  GLYCOLAX/MIRALAX  Take 17 g by mouth daily as needed for mild constipation or moderate constipation. Mix with 8 oz liquid and drink     sennosides-docusate sodium 8.6-50 MG tablet  Commonly known as:  SENOKOT-S  Take 2 tablets by mouth daily as needed for constipation.     tiotropium 18 MCG inhalation capsule  Commonly known as:  SPIRIVA  Place 18 mcg into inhaler and inhale daily as needed (shortness of breath).           Follow-up Information    Follow up with [name], MD. Schedule an appointment as soon as possible for a visit in 1 week.   Specialty:  Internal Medicine   Why:  Follow up appt after recent  hospitalization   Contact information:   [address] [place] [address] [phone]        The results of significant diagnostics from this hospitalization (including imaging, microbiology, ancillary and laboratory) are listed below for reference.    Significant Diagnostic Studies: Dg Chest 2 View  05/02/2015  CLINICAL DATA:  History of multiple myeloma. Decreased oral intake in urine output for 1 day. Initial encounter. EXAM: CHEST  2 VIEW COMPARISON:  PA and lateral chest 02/20/2015. FINDINGS: There is cardiomegaly without edema. No consolidative process, pneumothorax or effusion is identified. The patient is status post median sternotomy. No fracture is seen. IMPRESSION: Cardiomegaly without acute disease. Electronically Signed   By: Inge Rise M.D.   On: 05/02/2015 20:23    Microbiology: No results found for this or any previous visit (from the past 240 hour(s)).   Labs: Basic Metabolic Panel:  Recent Labs Lab 05/02/15 2014 05/03/15 0520 05/04/15 0557 05/05/15 0519  NA 134* 134* 133* 136  K 4.2 4.0 4.0 4.5  CL 104 104 105 108  CO2 '24 24 23 23  '$ GLUCOSE 96 101* 111* 127*  BUN 31* 29* 30* 28*  CREATININE 1.26* 1.08* 1.26* 1.02*  CALCIUM 9.0 8.0* 7.7* 7.8*  MG  --  1.6*  --  2.0  PHOS  --  5.3*  --   --    Liver Function Tests:  Recent Labs Lab 05/02/15 2014 05/03/15 0520  AST 22 23  ALT 12* 11*  ALKPHOS 53 49  BILITOT 0.3 0.7  PROT 9.4* 8.4*  ALBUMIN 3.4* 3.0*    Recent Labs Lab 05/02/15 2014  LIPASE 44   No results for  input(s): AMMONIA in the last 168 hours. CBC:  Recent Labs Lab 05/02/15 2014 05/03/15 0520 05/04/15 0557 05/05/15 0519  WBC 1.5* 1.3* 1.4* 2.2*  NEUTROABS 0.7* 0.6*  --   --   HGB 9.2* 8.7* 8.9* 9.0*  HCT 28.0* 27.5* 27.9* 27.9*  MCV 91.8 94.8 94.9 92.1  PLT 75* 70* 66* 55*   Cardiac Enzymes: No results for input(s): CKTOTAL, CKMB, CKMBINDEX, TROPONINI in the last 168 hours. BNP: BNP (last 3  results)  Recent Labs  05/23/14 1445 08/13/14 1216 10/09/14 1524  BNP 537.1* 150.0* 537.4*    ProBNP (last 3 results) No results for input(s): PROBNP in the last 8760 hours.  CBG: No results for input(s): GLUCAP in the last 168 hours.

## 2015-05-06 NOTE — Clinical Social Work Placement (Signed)
   CLINICAL SOCIAL WORK PLACEMENT  NOTE  Date:  05/06/2015  Patient Details  Name: [name] MRN: [id number] Date of Birth: December 01, 1931  Clinical Social Work is seeking post-discharge placement for this patient at the [place] level of care (*CSW will initial, date and re-position this form in  chart as items are completed):  Yes   Patient/family provided with [place] Work Department's list of facilities offering this level of care within the geographic area requested by the patient (or if unable, by the patient's family).  Yes   Patient/family informed of their freedom to choose among providers that offer the needed level of care, that participate in Medicare, Medicaid or managed care program needed by the patient, have an available bed and are willing to accept the patient.  Yes   Patient/family informed of [facility]'s ownership interest in [facility] and [facility], as well as of the fact that they are under no obligation to receive care at these facilities.  PASRR submitted to EDS on       PASRR number received on       Existing PASRR number confirmed on 05/04/15     FL2 transmitted to all facilities in geographic area requested by pt/family on 05/04/15     FL2 transmitted to all facilities within larger geographic area on       Patient informed that his/her managed care company has contracts with or will negotiate with certain facilities, including the following:        Yes   Patient/family informed of bed offers received.  Patient chooses bed at [place], [place]     Physician recommends and patient chooses bed at      Patient to be transferred to [place] on 05/06/15.  Patient to be transferred to facility by ptar     Patient family notified on 05/06/15 of transfer.  Name of family member notified:  - pt stated they would notify     PHYSICIAN Please sign FL2     Additional Comment:     _______________________________________________ Cranford Mon, LCSW 05/06/2015, 10:47 AM

## 2015-05-06 NOTE — Discharge Instructions (Signed)
Hypertension Hypertension, commonly called high blood pressure, is when the force of blood pumping through your arteries is too strong. Your arteries are the blood vessels that carry blood from your heart throughout your body. A blood pressure reading consists of a higher number over a lower number, such as 110/72. The higher number (systolic) is the pressure inside your arteries when your heart pumps. The lower number (diastolic) is the pressure inside your arteries when your heart relaxes. Ideally you want your blood pressure below 120/80. Hypertension forces your heart to work harder to pump blood. Your arteries may become narrow or stiff. Having untreated or uncontrolled hypertension can cause heart attack, stroke, kidney disease, and other problems. RISK FACTORS Some risk factors for high blood pressure are controllable. Others are not.  Risk factors you cannot control include:   Race. You may be at higher risk if you are African American.  Age. Risk increases with age.  Gender. Men are at higher risk than women before age 45 years. After age 65, women are at higher risk than men. Risk factors you can control include:  Not getting enough exercise or physical activity.  Being overweight.  Getting too much fat, sugar, calories, or salt in your diet.  Drinking too much alcohol. SIGNS AND SYMPTOMS Hypertension does not usually cause signs or symptoms. Extremely high blood pressure (hypertensive crisis) may cause headache, anxiety, shortness of breath, and nosebleed. DIAGNOSIS To check if you have hypertension, your health care provider will measure your blood pressure while you are seated, with your arm held at the level of your heart. It should be measured at least twice using the same arm. Certain conditions can cause a difference in blood pressure between your right and left arms. A blood pressure reading that is higher than normal on one occasion does not mean that you need treatment. If  it is not clear whether you have high blood pressure, you may be asked to return on a different day to have your blood pressure checked again. Or, you may be asked to monitor your blood pressure at home for 1 or more weeks. TREATMENT Treating high blood pressure includes making lifestyle changes and possibly taking medicine. Living a healthy lifestyle can help lower high blood pressure. You may need to change some of your habits. Lifestyle changes may include:  Following the DASH diet. This diet is high in fruits, vegetables, and whole grains. It is low in salt, red meat, and added sugars.  Keep your sodium intake below 2,300 mg per day.  Getting at least 30-45 minutes of aerobic exercise at least 4 times per week.  Losing weight if necessary.  Not smoking.  Limiting alcoholic beverages.  Learning ways to reduce stress. Your health care provider may prescribe medicine if lifestyle changes are not enough to get your blood pressure under control, and if one of the following is true:  You are 18-59 years of age and your systolic blood pressure is above 140.  You are 60 years of age or older, and your systolic blood pressure is above 150.  Your diastolic blood pressure is above 90.  You have diabetes, and your systolic blood pressure is over 140 or your diastolic blood pressure is over 90.  You have kidney disease and your blood pressure is above 140/90.  You have heart disease and your blood pressure is above 140/90. Your personal target blood pressure may vary depending on your medical conditions, your age, and other factors. HOME CARE INSTRUCTIONS    Have your blood pressure rechecked as directed by your health care provider.   Take medicines only as directed by your health care provider. Follow the directions carefully. Blood pressure medicines must be taken as prescribed. The medicine does not work as well when you skip doses. Skipping doses also puts you at risk for  problems.  Do not smoke.   Monitor your blood pressure at home as directed by your health care provider. SEEK MEDICAL CARE IF:   You think you are having a reaction to medicines taken.  You have recurrent headaches or feel dizzy.  You have swelling in your ankles.  You have trouble with your vision. SEEK IMMEDIATE MEDICAL CARE IF:  You develop a severe headache or confusion.  You have unusual weakness, numbness, or feel faint.  You have severe chest or abdominal pain.  You vomit repeatedly.  You have trouble breathing. MAKE SURE YOU:   Understand these instructions.  Will watch your condition.  Will get help right away if you are not doing well or get worse.   This information is not intended to replace advice given to you by your health care provider. Make sure you discuss any questions you have with your health care provider.   Document Released: 03/24/2005 Document Revised: 08/08/2014 Document Reviewed: 01/14/2013 Elsevier Interactive Patient Education 2016 Elsevier Inc.  

## 2015-05-06 NOTE — Progress Notes (Signed)
Called report to [facility] LPN at [facility].  Patient left via EMS for facility with all belongings and dentures.   Virginia Rochester, RN

## 2015-05-06 NOTE — NC FL2 (Signed)
[place] MEDICAID FL2 LEVEL OF CARE SCREENING TOOL     IDENTIFICATION  Patient Name: [name] Birthdate: February 02, 1932 Sex: female Admission Date (Current Location): 05/02/2015  [facility] and [place] Number:  [place] and Address:  [facility],  [place] [address], [place]      Provider Number: [id number]  Attending Physician Name and Address:  Robbie Lis, MD  Relative Name and Phone Number:       Current Level of Care: Hospital Recommended Level of Care: [place] Prior Approval Number:    Date Approved/Denied:   PASRR Number: [id number]  Discharge Plan: SNF    Current Diagnoses: Patient Active Problem List   Diagnosis Date Noted  . Malnutrition of moderate degree 05/05/2015  . Hypomagnesemia 05/04/2015  . Acute kidney injury superimposed on chronic kidney disease, stage II ([place]) 05/03/2015  . Hyponatremia 05/03/2015  . Weakness of both lower extremities 05/03/2015  . Hypoxia 05/02/2015  . Pancytopenia due to antineoplastic chemotherapy ([place]) 10/09/2014  . A-fib, CHA2DS2-VASc score 7 10/09/2014  . Bradycardia 09/10/2014  . Anemia in neoplastic disease   . Multiple myeloma ([place]) 09/07/2014  . Protein-calorie malnutrition, severe ([place]) 05/21/2014  . CAD (coronary artery disease) of artery bypass graft, atretic [place] 02/10/2014  . COPD (chronic obstructive pulmonary disease) ([place])   . GERD (gastroesophageal reflux disease) 02/05/2012    Orientation RESPIRATION BLADDER Height & Weight    Self, Time, Situation, Place  O2 (2L) Continent '5\' 2"'$  (157.5 cm) 122 lbs.  BEHAVIORAL SYMPTOMS/MOOD NEUROLOGICAL BOWEL NUTRITION STATUS      Continent Diet (Heart healthy)  AMBULATORY STATUS COMMUNICATION OF NEEDS Skin   Limited Assist Verbally Normal                       Personal Care Assistance Level of Assistance  Bathing, Dressing Bathing Assistance: Limited assistance   Dressing Assistance: Limited  assistance     Functional Limitations Info             SPECIAL CARE FACTORS FREQUENCY  PT (By licensed PT), OT (By licensed OT)     PT Frequency: 5 X weekly OT Frequency: 5 X weekly            Contractures Contractures Info: Present    Additional Factors Info  Code Status, Allergies Code Status Info: FULL Allergies Info: Lexapro, Soma, Wellbutrin, Albuterol Sulfate, Codeine, Levaquin, Plavix, Statins           Current Medications (05/06/2015):  This is the current hospital active medication list Current Facility-Administered Medications  Medication Dose Route Frequency Provider Last Rate Last Dose  . acetaminophen (TYLENOL) tablet 650 mg  650 mg Oral Q6H PRN Reubin Milan, MD      . albuterol (PROVENTIL) (2.5 MG/3ML) 0.083% nebulizer solution 1.25 mg  1.25 mg Nebulization Q6H PRN Reubin Milan, MD      . amLODipine (NORVASC) tablet 10 mg  10 mg Oral Daily Reubin Milan, MD   10 mg at 05/06/15 0946  . aspirin tablet 325 mg  325 mg Oral Daily Reubin Milan, MD   325 mg at 05/06/15 0945  . cholecalciferol (VITAMIN D) tablet 1,000 Units  1,000 Units Oral q morning - 10a Reubin Milan, MD   1,000 Units at 05/06/15 0945  . clonazePAM (KLONOPIN) tablet 0.5 mg  0.5 mg Oral BID PRN Reubin Milan, MD      . ezetimibe (ZETIA) tablet 10 mg  10 mg Oral QHS Reubin Milan, MD   10 mg at 05/05/15 2030  . feeding supplement (BOOST / RESOURCE BREEZE) liquid 1 Container  1 Container Oral TID BM Rosezetta Schlatter, RD   1 Container at 05/05/15 0915  . ipratropium (ATROVENT) nebulizer solution 0.5 mg  0.5 mg Nebulization Q6H PRN Reubin Milan, MD      . isosorbide mononitrate (IMDUR) 24 hr tablet 30 mg  30 mg Oral Daily Reubin Milan, MD   30 mg at 05/06/15 0946  . lidocaine (LIDODERM) 5 % 1 patch  1 patch Transdermal Daily PRN Reubin Milan, MD   1 patch at 05/06/15 [phone]  . megestrol (MEGACE) 40 MG/ML suspension 400 mg  400 mg Oral BID Reubin Milan, MD   400 mg at 05/06/15 0946  . metoprolol succinate (TOPROL-XL) 24 hr tablet 12.5 mg  12.5 mg Oral Daily Theodis Blaze, MD   12.5 mg at 05/05/15 0915  . naproxen sodium (ANAPROX) tablet 275-550 mg  275-550 mg Oral TID PRN Reubin Milan, MD   550 mg at 05/05/15 2030  . nitroGLYCERIN (NITROSTAT) SL tablet 0.4 mg  0.4 mg Sublingual Q5 min PRN Reubin Milan, MD      . pantoprazole (PROTONIX) EC tablet 40 mg  40 mg Oral Daily Reubin Milan, MD   40 mg at 05/06/15 0946  . polyethylene glycol (MIRALAX / GLYCOLAX) packet 17 g  17 g Oral Daily PRN Reubin Milan, MD   17 g at 05/05/15 0915  . senna-docusate (Senokot-S) tablet 2 tablet  2 tablet Oral Daily PRN Reubin Milan, MD   2 tablet at 05/05/15 0915  . sodium chloride flush (NS) 0.9 % injection 3 mL  3 mL Intravenous Q12H Reubin Milan, MD   3 mL at 05/05/15 2031  . tiotropium (SPIRIVA) inhalation capsule 18 mcg  18 mcg Inhalation Daily PRN Reubin Milan, MD      . traMADol Veatrice Bourbon) tablet 50 mg  50 mg Oral Q6H PRN Reubin Milan, MD   50 mg at 05/05/15 1745     Discharge Medications: Please see discharge summary for a list of discharge medications.  Relevant Imaging Results:  Relevant Lab Results:   Additional Information SSN: [id number]. Pt takes oral chemo- megace.  [place], [place],

## 2015-05-06 NOTE — Progress Notes (Signed)
Patient will discharge to [facility] Anticipated discharge date: 05/06/15 Family notified: pt to notify family Transportation by [facility]- scheduled for 11:15am  CSW signing off.  Domenica Reamer, [place] Social Worker [phone]

## 2015-05-07 ENCOUNTER — Encounter: Payer: Self-pay | Admitting: Neurology

## 2015-05-11 ENCOUNTER — Telehealth: Payer: Self-pay | Admitting: Oncology

## 2015-05-11 ENCOUNTER — Ambulatory Visit (HOSPITAL_BASED_OUTPATIENT_CLINIC_OR_DEPARTMENT_OTHER): Payer: Medicare Other | Admitting: Oncology

## 2015-05-11 ENCOUNTER — Other Ambulatory Visit (HOSPITAL_BASED_OUTPATIENT_CLINIC_OR_DEPARTMENT_OTHER): Payer: Medicare Other

## 2015-05-11 VITALS — BP 140/62 | HR 60 | Temp 98.0°F | Resp 18 | Wt 129.2 lb

## 2015-05-11 DIAGNOSIS — M25569 Pain in unspecified knee: Secondary | ICD-10-CM

## 2015-05-11 DIAGNOSIS — C9002 Multiple myeloma in relapse: Secondary | ICD-10-CM

## 2015-05-11 DIAGNOSIS — D6959 Other secondary thrombocytopenia: Secondary | ICD-10-CM

## 2015-05-11 DIAGNOSIS — D649 Anemia, unspecified: Secondary | ICD-10-CM

## 2015-05-11 DIAGNOSIS — G8929 Other chronic pain: Secondary | ICD-10-CM | POA: Diagnosis not present

## 2015-05-11 DIAGNOSIS — M545 Low back pain: Secondary | ICD-10-CM

## 2015-05-11 DIAGNOSIS — C9000 Multiple myeloma not having achieved remission: Secondary | ICD-10-CM

## 2015-05-11 LAB — CBC WITH DIFFERENTIAL/PLATELET
BASO%: 0.2 % (ref 0.0–2.0)
BASOS ABS: 0 10*3/uL (ref 0.0–0.1)
EOS ABS: 0 10*3/uL (ref 0.0–0.5)
EOS%: 0 % (ref 0.0–7.0)
HCT: 27.1 % — ABNORMAL LOW (ref 34.8–46.6)
HEMOGLOBIN: 8.8 g/dL — AB (ref 11.6–15.9)
LYMPH%: 24.3 % (ref 14.0–49.7)
MCH: 29.9 pg (ref 25.1–34.0)
MCHC: 32.5 g/dL (ref 31.5–36.0)
MCV: 92.2 fL (ref 79.5–101.0)
MONO#: 1.3 10*3/uL — ABNORMAL HIGH (ref 0.1–0.9)
MONO%: 19.7 % — AB (ref 0.0–14.0)
NEUT#: 3.7 10*3/uL (ref 1.5–6.5)
NEUT%: 55.8 % (ref 38.4–76.8)
Platelets: 97 10*3/uL — ABNORMAL LOW (ref 145–400)
RBC: 2.94 10*6/uL — ABNORMAL LOW (ref 3.70–5.45)
RDW: 17.6 % — ABNORMAL HIGH (ref 11.2–14.5)
WBC: 6.6 10*3/uL (ref 3.9–10.3)
lymph#: 1.6 10*3/uL (ref 0.9–3.3)

## 2015-05-11 LAB — COMPREHENSIVE METABOLIC PANEL
ALBUMIN: 3.4 g/dL — AB (ref 3.5–5.0)
ALK PHOS: 49 U/L (ref 40–150)
ALT: 10 U/L (ref 0–55)
AST: 17 U/L (ref 5–34)
Anion Gap: 11 mEq/L (ref 3–11)
BUN: 31.2 mg/dL — ABNORMAL HIGH (ref 7.0–26.0)
CALCIUM: 9.4 mg/dL (ref 8.4–10.4)
CHLORIDE: 107 meq/L (ref 98–109)
CO2: 20 mEq/L — ABNORMAL LOW (ref 22–29)
Creatinine: 1.2 mg/dL — ABNORMAL HIGH (ref 0.6–1.1)
EGFR: 42 mL/min/{1.73_m2} — AB (ref >90)
GLUCOSE: 136 mg/dL (ref 70–140)
POTASSIUM: 4.2 meq/L (ref 3.5–5.1)
SODIUM: 138 meq/L (ref 136–145)
Total Bilirubin: 0.32 mg/dL (ref 0.20–1.20)
Total Protein: 9.6 g/dL — ABNORMAL HIGH (ref 6.4–8.3)

## 2015-05-11 NOTE — Telephone Encounter (Signed)
Gave patient avs report and appointments for February.  °

## 2015-05-11 NOTE — Addendum Note (Signed)
Addended by: Randolm Idol on: 05/11/2015 03:53 PM   Modules accepted: Medications

## 2015-05-11 NOTE — Progress Notes (Signed)
Hematology and Oncology Follow Up Visit  [name] [id number] December 19, 1931 82 y.o. 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 She was found to have IgG level of 3430 and an M spike of 2.9 g/dL on 07/20/2014. Bone marrow biopsy on 08/30/2014 showed 78% plasma cell dyscrasia.   Prior Therapy:  Status post packed red cell transfusions during hospitalization in February 2016. This was repeated in May 2016.  She is status post a bone marrow biopsy on 08/30/2014. Systemic therapy with Velcade and dexamethasone weekly regimen starting on 09/22/2014. Therapy discontinued in 04/2015 due to progression of disease   Current therapy:  Revlimid 10 mg daily for 21 days with dexamethasone to start in January 2017. Therapy has been on hold since 05/02/2015 after completing 1 cycle of therapy. Therapy was withheld because of pancytopenia and possible pneumonia.   Interim History: [name] presents today for a follow-up visit accompanied by her daughter. Since the last visit, she was hospitalized on January 25th with respiratory distress and found to have daily required pneumonia. She was also noted to have pancytopenia related to Revlimid. She was advised to stop this medication at that time and have since been discharged from the hospital. She is currently resides in a skilled nursing facility where she have improved slowly. She is currently receiving rehabilitation and her activities are improving. She does use oxygen at nighttime. Her pain has been reasonably controlled at this time.   Appetite is improving slowly and I will not lost any more weight. She is ambulating with the help of a walker without any recent falls or syncope.  She does not report any headaches, blurry vision, syncope or seizures. She does not report any fevers, chills, sweats. She does not report any chest pain, palpitation  orthopnea. Does not report any cough, hemoptysis or hematemesis. She does report occasional nausea but no vomiting no hematochezia or melena. She does not report any frequency urgency or hesitancy. She does report chronic back and knee pain which is unchanged. Remaining review of systems unremarkable.   Medications: I have reviewed the patient's current medications.  Current Outpatient Prescriptions  Medication Sig Dispense Refill  . acetaminophen (TYLENOL) 325 MG tablet Take 2 tablets (650 mg total) by mouth every 6 (six) hours as needed for mild pain (or Fever >/= 101). 60 tablet 0  . albuterol-ipratropium (COMBIVENT) 18-103 MCG/ACT inhaler Inhale 1-2 puffs into the lungs every 4 (four) hours as needed for wheezing or shortness of breath.    [name] amLODipine (NORVASC) 10 MG tablet Take 1 tablet (10 mg total) by mouth daily. 30 tablet 0  . aspirin 325 MG tablet Take 325 mg by mouth daily.    . cholecalciferol (VITAMIN D) 1000 UNITS tablet Take 1,000 Units by mouth every morning.     . ezetimibe (ZETIA) 10 MG tablet Take 1 tablet (10 mg total) by mouth at bedtime.    . feeding supplement, ENSURE ENLIVE, (ENSURE ENLIVE) LIQD Take 237 mLs by mouth 3 (three) times daily between meals. 237 mL 12  . isosorbide mononitrate (IMDUR) 30 MG 24 hr tablet Take 1 tablet (30 mg total) by mouth daily. 30 tablet 3  . megestrol (MEGACE ORAL) 40 MG/ML suspension Take 10 mLs (400 mg total) by mouth 2 (two) times daily. 240 mL 0  . metoprolol succinate (TOPROL-XL) 25 MG 24 hr tablet Take 1 tablet (25 mg total) by mouth  daily. 30 tablet 3  . nitroGLYCERIN (NITROSTAT) 0.4 MG SL tablet Place 0.4 mg under the tongue every 5 (five) minutes as needed for chest pain. Reported on 04/20/2015    . pantoprazole (PROTONIX) 40 MG tablet Take 1 tablet (40 mg total) by mouth daily. 30 tablet 0  . polyethylene glycol powder (GLYCOLAX/MIRALAX) powder Take 17 g by mouth daily as needed for mild constipation or moderate constipation. Mix with  8 oz liquid and drink  0  . sennosides-docusate sodium (SENOKOT-S) 8.6-50 MG tablet Take 2 tablets by mouth daily as needed for constipation.    [name] tiotropium (SPIRIVA) 18 MCG inhalation capsule Place 18 mcg into inhaler and inhale daily as needed (shortness of breath).      No current facility-administered medications for this visit.     Allergies:  Allergies  Allergen Reactions  . Lexapro [Escitalopram Oxalate] Other (See Comments)    Fatigue   . Soma [Carisoprodol] Other (See Comments)    Fatigue   . Wellbutrin [Bupropion] Other (See Comments)    "couldn't function and take it"  . Albuterol Sulfate Other (See Comments)    Albuterol HFA inhaler caused nervousness   . Codeine Hives  . Levaquin [Levofloxacin In D5w] Other (See Comments)    Unknown allergic reaction  . Plavix [Clopidogrel Bisulfate] Other (See Comments)    Unknown reaction  . Statins     Unknown reaction     Past Medical History, Surgical history, Social history, and Family History were reviewed and updated.   Physical Exam: Blood pressure 140/62, pulse 60, temperature 98 F (36.7 C), temperature source Oral, resp. rate 18, weight 129 lb 3.2 oz (58.605 kg), SpO2 98 %. ECOG: 1 General appearance:  Chronically ill-appearing woman without distress. Head: Normocephalic, without obvious abnormality no thrush noted. Neck: no adenopathy no thyroid masses. Lymph nodes: Cervical, supraclavicular, and axillary nodes normal. Heart:regular rate and rhythm, S1, S2 normal, no murmur, click, rub or gallop Lung:chest clear, rales, normal symmetric air entry. Slight wheezes noted bilaterally. Abdomin: soft, non-tender, without masses or organomegaly no shifting dullness or ascites. EXT:no erythema, induration, or nodules   Lab Results: Lab Results  Component Value Date   WBC 6.6 05/11/2015   HGB 8.8* 05/11/2015   HCT 27.1* 05/11/2015   MCV 92.2 05/11/2015   PLT 97* 05/11/2015     Chemistry      Component Value  Date/Time   NA 136 05/05/2015 0519   NA 138 04/20/2015 1442   K 4.5 05/05/2015 0519   K 4.4 04/20/2015 1442   CL 108 05/05/2015 0519   CO2 23 05/05/2015 0519   CO2 22 04/20/2015 1442   BUN 28* 05/05/2015 0519   BUN 22.0 04/20/2015 1442   CREATININE 1.02* 05/05/2015 0519   CREATININE 1.4* 04/20/2015 1442      Component Value Date/Time   CALCIUM 7.8* 05/05/2015 0519   CALCIUM 9.3 04/20/2015 1442   ALKPHOS 49 05/03/2015 0520   ALKPHOS 68 04/20/2015 1442   AST 23 05/03/2015 0520   AST 18 04/20/2015 1442   ALT 11* 05/03/2015 0520   ALT <9 04/20/2015 1442   BILITOT 0.7 05/03/2015 0520   BILITOT <0.30 04/20/2015 1442      Results for MILEA, KLINK (MRN [id number]) as of 05/11/2015 14:49  Ref. Range 04/20/2015 14:42  M Protein SerPl Elph-Mcnc Latest Ref Range: Not Observed g/dL 3.2 (H)       Impression and Plan:  82 year old woman with the following issues:  1. IgG kappa  plasma cell disorder with 78% plasma cell infiltration in the bone marrow. She is quite symptomatic and certainly requires treatment. She meets the criteria of multiple myeloma given her anemia and overall symptomatology.  She is S/P Velcade and dexamethasone for 6 months with a partial response but subsequently developed relapsed disease.  After 1 cycle of Revlimid at 10 mg daily with dexamethasone therapy was interrupted because of cytopenias and pneumonia. She is continue to recover slowly from her recent infection in hospitalization. Although she is getting stronger she is not quite ready to resume therapy. I have recommended to hold any therapy for the time being and we'll monitor her progress. I anticipate that restarting therapy in 3 weeks if she is fully recovered. These options would include restarting Revlimid at a lower dose 5 mg daily or different agent.  2. Anemia: Her hemoglobin is stable at this time and she is asymptomatic. No transfusion is needed.  3. Weight loss: Appetite is improving and  her weight is stable.  4. Thrombocytopenia: Related to Revlimid appears to be improving.  5. VZV prophylaxis: She continues to be on acyclovir despite being off Velcade probably some next 6 months.  6. Pain: We'll control for the time being.  7. Follow-up: Will be 3 to 4 weeks to follow her progress.     Zola Button, MD 2/3/20173:10 PM

## 2015-05-14 LAB — MULTIPLE MYELOMA PANEL, SERUM
ALBUMIN SERPL ELPH-MCNC: 3.4 g/dL (ref 2.9–4.4)
ALPHA2 GLOB SERPL ELPH-MCNC: 1 g/dL (ref 0.4–1.0)
Albumin/Glob SerPl: 0.7 (ref 0.7–1.7)
Alpha 1: 0.3 g/dL (ref 0.0–0.4)
B-GLOBULIN SERPL ELPH-MCNC: 1.2 g/dL (ref 0.7–1.3)
GLOBULIN, TOTAL: 5.3 g/dL — AB (ref 2.2–3.9)
Gamma Glob SerPl Elph-Mcnc: 2.8 g/dL — ABNORMAL HIGH (ref 0.4–1.8)
IgA, Qn, Serum: 30 mg/dL — ABNORMAL LOW (ref 64–422)
IgM, Qn, Serum: 8 mg/dL — ABNORMAL LOW (ref 26–217)
M PROTEIN SERPL ELPH-MCNC: 2.7 g/dL — AB
TOTAL PROTEIN: 8.7 g/dL — AB (ref 6.0–8.5)

## 2015-05-14 LAB — KAPPA/LAMBDA LIGHT CHAINS

## 2015-05-21 ENCOUNTER — Encounter: Payer: Self-pay | Admitting: *Deleted

## 2015-06-01 ENCOUNTER — Telehealth: Payer: Self-pay | Admitting: Oncology

## 2015-06-01 ENCOUNTER — Ambulatory Visit (HOSPITAL_BASED_OUTPATIENT_CLINIC_OR_DEPARTMENT_OTHER): Payer: Medicare Other | Admitting: Oncology

## 2015-06-01 ENCOUNTER — Other Ambulatory Visit (HOSPITAL_BASED_OUTPATIENT_CLINIC_OR_DEPARTMENT_OTHER): Payer: Medicare Other

## 2015-06-01 VITALS — BP 178/73 | HR 66 | Temp 99.0°F | Resp 18 | Ht 62.0 in | Wt 127.7 lb

## 2015-06-01 DIAGNOSIS — G8929 Other chronic pain: Secondary | ICD-10-CM

## 2015-06-01 DIAGNOSIS — C9002 Multiple myeloma in relapse: Secondary | ICD-10-CM | POA: Diagnosis not present

## 2015-06-01 DIAGNOSIS — R634 Abnormal weight loss: Secondary | ICD-10-CM

## 2015-06-01 DIAGNOSIS — D649 Anemia, unspecified: Secondary | ICD-10-CM | POA: Diagnosis not present

## 2015-06-01 DIAGNOSIS — D6959 Other secondary thrombocytopenia: Secondary | ICD-10-CM

## 2015-06-01 DIAGNOSIS — C9000 Multiple myeloma not having achieved remission: Secondary | ICD-10-CM

## 2015-06-01 LAB — CBC WITH DIFFERENTIAL/PLATELET
BASO%: 0.4 % (ref 0.0–2.0)
BASOS ABS: 0 10*3/uL (ref 0.0–0.1)
EOS ABS: 0 10*3/uL (ref 0.0–0.5)
EOS%: 0.2 % (ref 0.0–7.0)
HEMATOCRIT: 25.4 % — AB (ref 34.8–46.6)
HEMOGLOBIN: 8.3 g/dL — AB (ref 11.6–15.9)
LYMPH#: 1.1 10*3/uL (ref 0.9–3.3)
LYMPH%: 25.5 % (ref 14.0–49.7)
MCH: 30.9 pg (ref 25.1–34.0)
MCHC: 32.8 g/dL (ref 31.5–36.0)
MCV: 94.1 fL (ref 79.5–101.0)
MONO#: 0.4 10*3/uL (ref 0.1–0.9)
MONO%: 8.4 % (ref 0.0–14.0)
NEUT#: 2.8 10*3/uL (ref 1.5–6.5)
NEUT%: 65.5 % (ref 38.4–76.8)
PLATELETS: 98 10*3/uL — AB (ref 145–400)
RBC: 2.7 10*6/uL — ABNORMAL LOW (ref 3.70–5.45)
RDW: 21.5 % — AB (ref 11.2–14.5)
WBC: 4.2 10*3/uL (ref 3.9–10.3)

## 2015-06-01 LAB — COMPREHENSIVE METABOLIC PANEL
ALBUMIN: 3.1 g/dL — AB (ref 3.5–5.0)
ALT: <9 U/L (ref 0–55)
AST: 15 U/L (ref 5–34)
Alkaline Phosphatase: 53 U/L (ref 40–150)
Anion Gap: 6 mEq/L (ref 3–11)
BUN: 27.5 mg/dL — AB (ref 7.0–26.0)
CALCIUM: 9.2 mg/dL (ref 8.4–10.4)
CHLORIDE: 106 meq/L (ref 98–109)
CO2: 21 mEq/L — ABNORMAL LOW (ref 22–29)
CREATININE: 1.2 mg/dL — AB (ref 0.6–1.1)
EGFR: 44 mL/min/{1.73_m2} — ABNORMAL LOW (ref >90)
GLUCOSE: 82 mg/dL (ref 70–140)
Potassium: 3.9 mEq/L (ref 3.5–5.1)
Sodium: 133 mEq/L — ABNORMAL LOW (ref 136–145)
Total Bilirubin: 0.54 mg/dL (ref 0.20–1.20)
Total Protein: 10 g/dL — ABNORMAL HIGH (ref 6.4–8.3)

## 2015-06-01 MED ORDER — TRAMADOL HCL 50 MG PO TABS: 50.0000 mg | ORAL_TABLET | Freq: Four times a day (QID) | ORAL | Status: DC | PRN

## 2015-06-01 NOTE — Progress Notes (Signed)
Hematology and Oncology Follow Up Visit  [name] [id number] Oct 17, 1931 82 y.o. 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 She was found to have IgG level of 3430 and an M spike of 2.9 g/dL on 07/20/2014. Bone marrow biopsy on 08/30/2014 showed 78% plasma cell dyscrasia.   Prior Therapy:  Status post packed red cell transfusions during hospitalization in February 2016. This was repeated in May 2016.  She is status post a bone marrow biopsy on 08/30/2014. Systemic therapy with Velcade and dexamethasone weekly regimen starting on 09/22/2014. Therapy discontinued in 04/2015 due to progression of disease  Revlimid 10 mg daily for 21 days with dexamethasone to start in January 2017. Therapy has been on hold since 05/02/2015 after completing 1 cycle of therapy. Therapy was withheld because of pancytopenia and possible pneumonia.   Current therapy: She is currently on treatment holiday and supportive care only.  Interim History: [name] presents today for a follow-up visit accompanied by her daughter. Since the last visit, she is reporting some improvement in her symptoms since his discharge from the hospital. He is currently back at home and receiving physical therapy. Her appetite have continues to be rather poor and her weight have declined some. She does report some occasional pain for which she takes tramadol. Overall performance status is still quite limited in her mobility is improving but still limited.   She does not report any headaches, blurry vision, syncope or seizures. She does not report any fevers, chills, sweats. She does not report any chest pain, palpitation orthopnea. Does not report any cough, hemoptysis or hematemesis. She does not report any vomiting, hematochezia or melena. She does not report any frequency urgency or hesitancy. She does report chronic pain is  rather diffuse.. Remaining review of systems unremarkable.   Medications: I have reviewed the patient's current medications.  Current Outpatient Prescriptions  Medication Sig Dispense Refill  . acetaminophen (TYLENOL) 325 MG tablet Take 2 tablets (650 mg total) by mouth every 6 (six) hours as needed for mild pain (or Fever >/= 101). 60 tablet 0  . albuterol-ipratropium (COMBIVENT) 18-103 MCG/ACT inhaler Inhale 1-2 puffs into the lungs every 4 (four) hours as needed for wheezing or shortness of breath.    [name] amLODipine (NORVASC) 10 MG tablet Take 1 tablet (10 mg total) by mouth daily. 30 tablet 0  . aspirin 325 MG tablet Take 325 mg by mouth daily.    . cholecalciferol (VITAMIN D) 1000 UNITS tablet Take 1,000 Units by mouth every morning.     . [facility] NASAL SPRAY 0.05 % nasal spray As directed  0  . ezetimibe (ZETIA) 10 MG tablet Take 1 tablet (10 mg total) by mouth at bedtime.    . feeding supplement, ENSURE ENLIVE, (ENSURE ENLIVE) LIQD Take 237 mLs by mouth 3 (three) times daily between meals. 237 mL 12  . gabapentin (NEURONTIN) 100 MG capsule Take 100 mg by mouth as directed.    . isosorbide mononitrate (IMDUR) 30 MG 24 hr tablet Take 1 tablet (30 mg total) by mouth daily. 30 tablet 3  . megestrol (MEGACE ORAL) 40 MG/ML suspension Take 10 mLs (400 mg total) by mouth 2 (two) times daily. 240 mL 0  . metoprolol succinate (TOPROL-XL) 25 MG 24 hr tablet Take 1 tablet (25 mg total) by mouth daily. 30 tablet 3  . nitroGLYCERIN (NITROSTAT) 0.4 MG SL tablet Place 0.4 mg  under the tongue every 5 (five) minutes as needed for chest pain. Reported on 04/20/2015    . pantoprazole (PROTONIX) 40 MG tablet Take 1 tablet (40 mg total) by mouth daily. 30 tablet 0  . polyethylene glycol powder (GLYCOLAX/MIRALAX) powder Take 17 g by mouth daily as needed for mild constipation or moderate constipation. Mix with 8 oz liquid and drink  0  . prochlorperazine (COMPAZINE) 10 MG tablet Take 10 mg by mouth every 6 (six) hours  as needed.    . sennosides-docusate sodium (SENOKOT-S) 8.6-50 MG tablet Take 2 tablets by mouth daily as needed for constipation.    [name] tiotropium (SPIRIVA) 18 MCG inhalation capsule Place 18 mcg into inhaler and inhale daily as needed (shortness of breath).     . traMADol (ULTRAM) 50 MG tablet Take 1 tablet (50 mg total) by mouth every 6 (six) hours as needed. 60 tablet 0   No current facility-administered medications for this visit.     Allergies:  Allergies  Allergen Reactions  . Lexapro [Escitalopram Oxalate] Other (See Comments)    Fatigue   . Soma [Carisoprodol] Other (See Comments)    Fatigue   . Wellbutrin [Bupropion] Other (See Comments)    "couldn't function and take it"  . Albuterol Sulfate Other (See Comments)    Albuterol HFA inhaler caused nervousness   . Codeine Hives  . Levaquin [Levofloxacin In D5w] Other (See Comments)    Unknown allergic reaction  . Plavix [Clopidogrel Bisulfate] Other (See Comments)    Unknown reaction  . Statins     Unknown reaction     Past Medical History, Surgical history, Social history, and Family History were reviewed and updated.   Physical Exam: Blood pressure 178/73, pulse 66, temperature 99 F (37.2 C), temperature source Oral, resp. rate 18, height '5\' 2"'$  (1.575 m), weight 127 lb 11.2 oz (57.924 kg), SpO2 99 %. ECOG: 2 General appearance:  Chronically ill-appearing woman appeared without distress today. Head: Normocephalic, without obvious abnormality no oral ulcers or lesions. Neck: no adenopathy no thyroid masses. Lymph nodes: Cervical, supraclavicular, and axillary nodes normal. Heart:regular rate and rhythm, S1, S2 normal, no murmur, click, rub or gallop Lung:chest clear, rales, normal symmetric air entry. No wheezes or dullness to percussion. Abdomin: soft, non-tender, without masses or organomegaly no rebound or guarding. EXT:no erythema, induration, or nodules   Lab Results: Lab Results  Component Value Date   WBC  4.2 06/01/2015   HGB 8.3* 06/01/2015   HCT 25.4* 06/01/2015   MCV 94.1 06/01/2015   PLT 98* 06/01/2015     Chemistry      Component Value Date/Time   NA 138 05/11/2015 1440   NA 136 05/05/2015 0519   K 4.2 05/11/2015 1440   K 4.5 05/05/2015 0519   CL 108 05/05/2015 0519   CO2 20* 05/11/2015 1440   CO2 23 05/05/2015 0519   BUN 31.2* 05/11/2015 1440   BUN 28* 05/05/2015 0519   CREATININE 1.2* 05/11/2015 1440   CREATININE 1.02* 05/05/2015 0519      Component Value Date/Time   CALCIUM 9.4 05/11/2015 1440   CALCIUM 7.8* 05/05/2015 0519   ALKPHOS 49 05/11/2015 1440   ALKPHOS 49 05/03/2015 0520   AST 17 05/11/2015 1440   AST 23 05/03/2015 0520   ALT 10 05/11/2015 1440   ALT 11* 05/03/2015 0520   BILITOT 0.32 05/11/2015 1440   BILITOT 0.7 05/03/2015 0520            Impression and Plan:  82 year old woman with the following issues:  1. IgG kappa plasma cell disorder with 78% plasma cell infiltration in the bone marrow. She is quite symptomatic and certainly requires treatment. She meets the criteria of multiple myeloma given her anemia and overall symptomatology.  She is S/P Velcade and dexamethasone for 6 months with a partial response but subsequently developed relapsed disease.  After 1 cycle of Revlimid at 10 mg daily with dexamethasone therapy was interrupted because of cytopenias and pneumonia.   Option of treatment were reviewed again including restarting Revlimid at the previous dose or at a reduced dose of using different salvage therapy. I fear that regardless of any treatment we use, she has a rather marginal performance status and marginal health status that she might experience more side effects and potentially some benefit on the long run.  For the time being, she would like to defer any treatment and hope that she would improve clinically in the next few weeks that maybe she'll be able to handle future treatments. She also understands that without  treatment, the cancer will progress and likely can cause for further debilitation. If that happens, then hospice would be the way to go and she is ready for the possibility and agreeable if that time comes.   2. Anemia: Her hemoglobin is stable at this time and she is asymptomatic. No need for transfusion today.  3. Weight loss: Appetite is declining. We discussed some strategies to help boost her appetite.  4. Thrombocytopenia: Related to Revlimid appears to be improving.  5. VZV prophylaxis: She continues to be on acyclovir despite being off Velcade probably some next 6 months.  6. Pain:I have refilled her tramadol today which appears to have helped her pain and encouraged her to take it more than once a day.  7. Follow-up: Will be 3 to 4 weeks to follow her progress.     Zola Button, MD 2/24/20173:57 PM

## 2015-06-01 NOTE — Telephone Encounter (Signed)
per pof to sch pt appt-gave pt copy of avs °

## 2015-06-02 ENCOUNTER — Emergency Department (HOSPITAL_COMMUNITY): Payer: Medicare Other

## 2015-06-02 ENCOUNTER — Inpatient Hospital Stay (HOSPITAL_COMMUNITY)
Admission: EM | Admit: 2015-06-02 | Discharge: 2015-06-05 | DRG: 689 | Disposition: A | Discharge status: Skilled Nursing Facility | Payer: Medicare Other | Attending: Internal Medicine | Admitting: Internal Medicine

## 2015-06-02 ENCOUNTER — Encounter (HOSPITAL_COMMUNITY): Payer: Self-pay | Admitting: Emergency Medicine

## 2015-06-02 DIAGNOSIS — N390 Urinary tract infection, site not specified: Secondary | ICD-10-CM | POA: Diagnosis not present

## 2015-06-02 DIAGNOSIS — C9000 Multiple myeloma not having achieved remission: Secondary | ICD-10-CM | POA: Diagnosis present

## 2015-06-02 DIAGNOSIS — I252 Old myocardial infarction: Secondary | ICD-10-CM

## 2015-06-02 DIAGNOSIS — I4891 Unspecified atrial fibrillation: Secondary | ICD-10-CM | POA: Diagnosis present

## 2015-06-02 DIAGNOSIS — J449 Chronic obstructive pulmonary disease, unspecified: Secondary | ICD-10-CM | POA: Diagnosis present

## 2015-06-02 DIAGNOSIS — G9340 Encephalopathy, unspecified: Secondary | ICD-10-CM | POA: Diagnosis present

## 2015-06-02 DIAGNOSIS — Z87891 Personal history of nicotine dependence: Secondary | ICD-10-CM | POA: Diagnosis not present

## 2015-06-02 DIAGNOSIS — N179 Acute kidney failure, unspecified: Secondary | ICD-10-CM

## 2015-06-02 DIAGNOSIS — B964 Proteus (mirabilis) (morganii) as the cause of diseases classified elsewhere: Secondary | ICD-10-CM | POA: Diagnosis present

## 2015-06-02 DIAGNOSIS — T451X5A Adverse effect of antineoplastic and immunosuppressive drugs, initial encounter: Secondary | ICD-10-CM | POA: Diagnosis present

## 2015-06-02 DIAGNOSIS — C9002 Multiple myeloma in relapse: Secondary | ICD-10-CM

## 2015-06-02 DIAGNOSIS — N182 Chronic kidney disease, stage 2 (mild): Secondary | ICD-10-CM | POA: Diagnosis present

## 2015-06-02 DIAGNOSIS — Z955 Presence of coronary angioplasty implant and graft: Secondary | ICD-10-CM | POA: Diagnosis not present

## 2015-06-02 DIAGNOSIS — L8990 Pressure ulcer of unspecified site, unspecified stage: Secondary | ICD-10-CM | POA: Insufficient documentation

## 2015-06-02 DIAGNOSIS — N189 Chronic kidney disease, unspecified: Secondary | ICD-10-CM

## 2015-06-02 DIAGNOSIS — I129 Hypertensive chronic kidney disease with stage 1 through stage 4 chronic kidney disease, or unspecified chronic kidney disease: Secondary | ICD-10-CM | POA: Diagnosis present

## 2015-06-02 DIAGNOSIS — D61810 Antineoplastic chemotherapy induced pancytopenia: Secondary | ICD-10-CM | POA: Diagnosis present

## 2015-06-02 DIAGNOSIS — D630 Anemia in neoplastic disease: Secondary | ICD-10-CM | POA: Diagnosis present

## 2015-06-02 DIAGNOSIS — I2510 Atherosclerotic heart disease of native coronary artery without angina pectoris: Secondary | ICD-10-CM | POA: Diagnosis present

## 2015-06-02 DIAGNOSIS — R4182 Altered mental status, unspecified: Secondary | ICD-10-CM

## 2015-06-02 DIAGNOSIS — Z66 Do not resuscitate: Secondary | ICD-10-CM | POA: Diagnosis present

## 2015-06-02 DIAGNOSIS — Z9049 Acquired absence of other specified parts of digestive tract: Secondary | ICD-10-CM | POA: Diagnosis not present

## 2015-06-02 DIAGNOSIS — I482 Chronic atrial fibrillation: Secondary | ICD-10-CM | POA: Diagnosis not present

## 2015-06-02 LAB — I-STAT CHEM 8, ED
BUN: 31 mg/dL — AB (ref 6–20)
CREATININE: 1.3 mg/dL — AB (ref 0.44–1.00)
Calcium, Ion: 1.13 mmol/L (ref 1.13–1.30)
Chloride: 108 mmol/L (ref 101–111)
Glucose, Bld: 154 mg/dL — ABNORMAL HIGH (ref 65–99)
HEMATOCRIT: 25 % — AB (ref 36.0–46.0)
HEMOGLOBIN: 8.5 g/dL — AB (ref 12.0–15.0)
Potassium: 4.2 mmol/L (ref 3.5–5.1)
SODIUM: 139 mmol/L (ref 135–145)
TCO2: 17 mmol/L (ref 0–100)

## 2015-06-02 LAB — CBC WITH DIFFERENTIAL/PLATELET
BASOS PCT: 0 %
Basophils Absolute: 0 10*3/uL (ref 0.0–0.1)
EOS ABS: 0 10*3/uL (ref 0.0–0.7)
Eosinophils Relative: 0 %
HCT: 24.3 % — ABNORMAL LOW (ref 36.0–46.0)
Hemoglobin: 7.8 g/dL — ABNORMAL LOW (ref 12.0–15.0)
LYMPHS ABS: 0.4 10*3/uL — AB (ref 0.7–4.0)
Lymphocytes Relative: 15 %
MCH: 30.7 pg (ref 26.0–34.0)
MCHC: 32.1 g/dL (ref 30.0–36.0)
MCV: 95.7 fL (ref 78.0–100.0)
MONO ABS: 0.4 10*3/uL (ref 0.1–1.0)
MONOS PCT: 15 %
Neutro Abs: 1.7 10*3/uL (ref 1.7–7.7)
Neutrophils Relative %: 70 %
Platelets: 95 10*3/uL — ABNORMAL LOW (ref 150–400)
RBC: 2.54 MIL/uL — ABNORMAL LOW (ref 3.87–5.11)
RDW: 20.2 % — AB (ref 11.5–15.5)
WBC: 2.5 10*3/uL — ABNORMAL LOW (ref 4.0–10.5)

## 2015-06-02 LAB — COMPREHENSIVE METABOLIC PANEL
ALBUMIN: 3.3 g/dL — AB (ref 3.5–5.0)
ALK PHOS: 45 U/L (ref 38–126)
ALT: 10 U/L — AB (ref 14–54)
ANION GAP: 11 (ref 5–15)
AST: 26 U/L (ref 15–41)
BILIRUBIN TOTAL: 0.7 mg/dL (ref 0.3–1.2)
BUN: 33 mg/dL — AB (ref 6–20)
CALCIUM: 8.9 mg/dL (ref 8.9–10.3)
CO2: 18 mmol/L — ABNORMAL LOW (ref 22–32)
CREATININE: 1.46 mg/dL — AB (ref 0.44–1.00)
Chloride: 106 mmol/L (ref 101–111)
GFR calc Af Amer: 38 mL/min — ABNORMAL LOW (ref >60)
GFR calc non Af Amer: 33 mL/min — ABNORMAL LOW (ref >60)
GLUCOSE: 152 mg/dL — AB (ref 65–99)
Potassium: 4.1 mmol/L (ref 3.5–5.1)
Sodium: 135 mmol/L (ref 135–145)
TOTAL PROTEIN: 9.4 g/dL — AB (ref 6.5–8.1)

## 2015-06-02 LAB — URINALYSIS, ROUTINE W REFLEX MICROSCOPIC
BILIRUBIN URINE: NEGATIVE
GLUCOSE, UA: NEGATIVE mg/dL
KETONES UR: NEGATIVE mg/dL
Nitrite: NEGATIVE
PH: 7.5 (ref 5.0–8.0)
Specific Gravity, Urine: 1.016 (ref 1.005–1.030)

## 2015-06-02 LAB — INFLUENZA PANEL BY PCR (TYPE A & B)
H1N1 flu by pcr: NOT DETECTED
INFLAPCR: NEGATIVE
Influenza B By PCR: NEGATIVE

## 2015-06-02 LAB — URINE MICROSCOPIC-ADD ON: SQUAMOUS EPITHELIAL / LPF: NONE SEEN

## 2015-06-02 LAB — I-STAT CG4 LACTIC ACID, ED: Lactic Acid, Venous: 1.05 mmol/L (ref 0.5–2.0)

## 2015-06-02 MED ORDER — ACETAMINOPHEN 500 MG PO TABS: 1000.0000 mg | ORAL_TABLET | Freq: Once | ORAL | Status: DC

## 2015-06-02 MED ORDER — AMLODIPINE BESYLATE 10 MG PO TABS
10.0000 mg | ORAL_TABLET | Freq: Every day | ORAL | Status: DC
  Administered 2015-06-03 – 2015-06-05 (×3): 10 mg via ORAL
  Filled 2015-06-02 (×3): qty 1

## 2015-06-02 MED ORDER — METOPROLOL SUCCINATE ER 25 MG PO TB24
25.0000 mg | ORAL_TABLET | Freq: Every day | ORAL | Status: DC
  Administered 2015-06-02 – 2015-06-05 (×4): 25 mg via ORAL
  Filled 2015-06-02 (×4): qty 1

## 2015-06-02 MED ORDER — DEXTROSE 5 % IV SOLN
1.0000 g | Freq: Once | INTRAVENOUS | Status: AC
  Administered 2015-06-02: 1 g via INTRAVENOUS
  Filled 2015-06-02: qty 10

## 2015-06-02 MED ORDER — ONDANSETRON HCL 4 MG/2ML IJ SOLN: 4.0000 mg | Freq: Four times a day (QID) | INTRAMUSCULAR | Status: DC | PRN

## 2015-06-02 MED ORDER — SODIUM CHLORIDE 0.9 % IV SOLN
INTRAVENOUS | Status: DC
  Administered 2015-06-02: 12:00:00 via INTRAVENOUS

## 2015-06-02 MED ORDER — POLYETHYLENE GLYCOL 3350 17 GM/SCOOP PO POWD: 17.0000 g | Freq: Every day | ORAL | Status: DC | PRN

## 2015-06-02 MED ORDER — ISOSORBIDE MONONITRATE ER 30 MG PO TB24
30.0000 mg | ORAL_TABLET | Freq: Every day | ORAL | Status: DC
  Administered 2015-06-03 – 2015-06-05 (×3): 30 mg via ORAL
  Filled 2015-06-02 (×3): qty 1

## 2015-06-02 MED ORDER — TRAMADOL HCL 50 MG PO TABS
50.0000 mg | ORAL_TABLET | Freq: Four times a day (QID) | ORAL | Status: DC | PRN
  Administered 2015-06-02 – 2015-06-05 (×4): 50 mg via ORAL
  Filled 2015-06-02 (×4): qty 1

## 2015-06-02 MED ORDER — MEGESTROL ACETATE 40 MG/ML PO SUSP
400.0000 mg | Freq: Two times a day (BID) | ORAL | Status: DC
  Administered 2015-06-02 – 2015-06-05 (×7): 400 mg via ORAL
  Filled 2015-06-02 (×8): qty 10

## 2015-06-02 MED ORDER — ASPIRIN 325 MG PO TABS
325.0000 mg | ORAL_TABLET | Freq: Every day | ORAL | Status: DC
  Administered 2015-06-02 – 2015-06-05 (×4): 325 mg via ORAL
  Filled 2015-06-02 (×4): qty 1

## 2015-06-02 MED ORDER — EZETIMIBE 10 MG PO TABS
10.0000 mg | ORAL_TABLET | Freq: Every day | ORAL | Status: DC
  Administered 2015-06-02 – 2015-06-04 (×3): 10 mg via ORAL
  Filled 2015-06-02 (×3): qty 1

## 2015-06-02 MED ORDER — POLYETHYLENE GLYCOL 3350 17 G PO PACK
17.0000 g | PACK | Freq: Every day | ORAL | Status: DC | PRN
  Administered 2015-06-04: 17 g via ORAL
  Filled 2015-06-02: qty 1

## 2015-06-02 MED ORDER — TIOTROPIUM BROMIDE MONOHYDRATE 18 MCG IN CAPS: 18.0000 ug | ORAL_CAPSULE | Freq: Every day | RESPIRATORY_TRACT | Status: DC | PRN

## 2015-06-02 MED ORDER — ACETAMINOPHEN 325 MG PO TABS: 650.0000 mg | ORAL_TABLET | Freq: Four times a day (QID) | ORAL | Status: DC | PRN

## 2015-06-02 MED ORDER — SODIUM CHLORIDE 0.9 % IV BOLUS (SEPSIS)
1000.0000 mL | Freq: Once | INTRAVENOUS | Status: AC
  Administered 2015-06-02: 1000 mL via INTRAVENOUS

## 2015-06-02 MED ORDER — ENSURE ENLIVE PO LIQD
237.0000 mL | Freq: Three times a day (TID) | ORAL | Status: DC
  Administered 2015-06-02 – 2015-06-03 (×3): 237 mL via ORAL

## 2015-06-02 MED ORDER — ACETAMINOPHEN 650 MG RE SUPP: 650.0000 mg | Freq: Four times a day (QID) | RECTAL | Status: DC | PRN

## 2015-06-02 MED ORDER — ONDANSETRON HCL 4 MG PO TABS: 4.0000 mg | ORAL_TABLET | Freq: Four times a day (QID) | ORAL | Status: DC | PRN

## 2015-06-02 MED ORDER — PANTOPRAZOLE SODIUM 40 MG PO TBEC
40.0000 mg | DELAYED_RELEASE_TABLET | Freq: Every day | ORAL | Status: DC
  Administered 2015-06-02 – 2015-06-05 (×4): 40 mg via ORAL
  Filled 2015-06-02 (×4): qty 1

## 2015-06-02 MED ORDER — GABAPENTIN 100 MG PO CAPS
100.0000 mg | ORAL_CAPSULE | Freq: Every morning | ORAL | Status: DC
  Administered 2015-06-03 – 2015-06-05 (×3): 100 mg via ORAL
  Filled 2015-06-02 (×3): qty 1

## 2015-06-02 MED ORDER — DEXTROSE 5 % IV SOLN
1.0000 g | INTRAVENOUS | Status: DC
  Administered 2015-06-03 – 2015-06-04 (×2): 1 g via INTRAVENOUS
  Filled 2015-06-02 (×2): qty 10

## 2015-06-02 NOTE — ED Notes (Signed)
Patient transported to X-ray 

## 2015-06-02 NOTE — Evaluation (Signed)
Clinical/Bedside Swallow Evaluation Patient Details  Name: [name] MRN: [id number] Date of Birth: 1931/10/15  Today's Date: 06/02/2015 Time: SLP Start Time (ACUTE ONLY): 1635 SLP Stop Time (ACUTE ONLY): 1651 SLP Time Calculation (min) (ACUTE ONLY): 16 min  Past Medical History:  Past Medical History  Diagnosis Date  . COPD (chronic obstructive pulmonary disease) ([place])   . Gout   . Coronary atherosclerosis of native coronary artery 02/05/2012  . Hypertension   . High blood cholesterol   . Heart murmur     "all my life" (02/05/2012)  . Anginal pain ([place])   . Myocardial infarction ([place]) 1990's; 2000's    "2 total, I think" (02/05/2012)  . Pneumonia     "several times" (02/05/2012)  . Chronic bronchitis ([place])   . Shortness of breath     "sometimes when I'm laying down; always when I do too much" (02/05/2012)  . History of blood transfusion   . External bleeding hemorrhoids     "act up at times" (02/05/2012)  . Chronic back pain     "all over" (02/05/2012)  . Depression   . CAD (coronary artery disease) of artery bypass graft 02/09/14    atretic LIMA  . Multiple myeloma ([place]) 09/07/2014  . A-fib ([place]) 10/09/2014  . CKD (chronic kidney disease)     Stage II   Past Surgical History:  Past Surgical History  Procedure Laterality Date  . Appendectomy  ? 1970's  . Ectopic pregnancy surgery  1970's  . Abdominal hysterectomy  ? 1970's    "partial 1st time; complete 2nd" (02/05/2012)  . Cholecystectomy  ~ 2010  . Cataract extraction w/ intraocular lens  implant, bilateral  [phone]  . Coronary angioplasty with stent placement  2005  . Coronary artery bypass graft  2005    CABG X2  . Cardiac catheterization  02/09/14    atretic LIMA,  patent VG-dRCA and Total occlusion of the native RCA proximally prior to remotely placement RCA stent, with mild proximal 30% LAD stenosis and 30% distal circumflex stenoses.  EF 55%.  . Left heart catheterization with coronary/graft  angiogram N/A 02/09/2014    Procedure: LEFT HEART CATHETERIZATION WITH Beatrix Fetters;  Surgeon: Troy Sine, MD;  Location: [facility] CATH LAB;  Service: Cardiovascular;  Laterality: N/A;  . Esophagogastroduodenoscopy (egd) with propofol Left 05/22/2014    Procedure: ESOPHAGOGASTRODUODENOSCOPY (EGD) WITH PROPOFOL;  Surgeon: Arta Silence, MD;  Location: [facility] ENDOSCOPY;  Service: Endoscopy;  Laterality: Left;   HPI:  82 y.o. female with a past medical history of multiple myeloma, history of atrial fibrillation not on anticoagulation, pancytopenia, COPD, hypertension, who was recently hospitalized in January for acute hypoxic respiratory failure. She subsequently was sent to a skilled nursing facility for short-term rehabilitation. She came back home about 2 weeks ago. Daughter did tell me that patient was treated for 10 days at the skilled nursing facility with antibiotics for pneumonia. Her daughter is the primary caregiver. However, she works and is not at home all the time. Since yesterday patient has been feeling weak. She had some confusion overnight. This morning she was very confused and lethargic. Noted to have a fever as well. So she was brought into the hospital for further management. Patient denies any nausea, vomiting. She has had an occasional cough but has been dry. Denies any dysuria. No diarrhea. Does have chronic back pain which has been unchanged.  CXR on 06/02/15 indicated Low lung volumes with linear opacities at the bilateral lung bases, unchanged from  prior, potentially atelectasis/scarring, or atypical Infection.  Assessment / Plan / Recommendation Clinical Impression   Pt did not exhibit any overt s/s of aspiration with any consistency administered including thin-solids; normal oropharyngeal swallow observed; pt denies any dysphagia symptoms at this time; ST not recommended at this time; please re-consult prn if changes with swallowing occur.    Aspiration Risk  No  limitations    Diet Recommendation   Regular/thin  Medication Administration: Whole meds with liquid    Other  Recommendations Oral Care Recommendations: Oral care BID   Follow up Recommendations  None    Frequency and Duration     n/a       Prognosis Prognosis for Safe Diet Advancement: Good      Swallow Study   General Date of Onset: 06/02/15 HPI: 82 y.o. female with a past medical history of multiple myeloma, history of atrial fibrillation not on anticoagulation, pancytopenia, COPD, hypertension, who was recently hospitalized in January for acute hypoxic respiratory failure. She subsequently was sent to a skilled nursing facility for short-term rehabilitation. She came back home about 2 weeks ago. Daughter did tell me that patient was treated for 10 days at the skilled nursing facility with antibiotics for pneumonia. Her daughter is the primary caregiver. However, she works and is not at home all the time. Since yesterday patient has been feeling weak. She had some confusion overnight. This morning she was very confused and lethargic. Noted to have a fever as well. So she was brought into the hospital for further management. Patient denies any nausea, vomiting. She has had an occasional cough but has been dry. Denies any dysuria. No diarrhea. Does have chronic back pain which has been unchanged.  Type of Study: Bedside Swallow Evaluation Diet Prior to this Study: Regular;Thin liquids (Nursing stated she got a puree tray at lunch ) Temperature Spikes Noted: Yes Respiratory Status: Nasal cannula History of Recent Intubation: No Behavior/Cognition: Alert;Cooperative;Pleasant mood Oral Cavity Assessment: Within Functional Limits Oral Care Completed by SLP: No Oral Cavity - Dentition: Adequate natural dentition Vision: Functional for self-feeding Self-Feeding Abilities: Able to feed self Patient Positioning: Upright in bed Baseline Vocal Quality: Normal Volitional Cough:  Strong Volitional Swallow: Able to elicit    Oral/Motor/Sensory Function Overall Oral Motor/Sensory Function: Within functional limits   Ice Chips Ice chips: Not tested   Thin Liquid Thin Liquid: Within functional limits Presentation: Cup;Straw    Nectar Thick Nectar Thick Liquid: Not tested   Honey Thick Honey Thick Liquid: Not tested   Puree Puree: Within functional limits Presentation: Spoon   Solid      Solid: Within functional limits Presentation: Spoon        Dynasti Kerman,PAT, M.S., CCC-SLP 06/02/2015,4:54 PM

## 2015-06-02 NOTE — ED Notes (Signed)
Brought in by [facility] from home with c/o fever and altered mental status.  Per EMS, pt's daughter reported that pt has been having "worsening confusion".  Pt was seen by oncologist yesterday for remission of her multiple myeloma.  Pt's temp was T103.4(oral) on EMS' arrival.  Was given Tylenol 1000 mg po en route to ED.

## 2015-06-02 NOTE — H&P (Signed)
[facility] History and Physical  [name] YIR:[id number] DOB: 12-Apr-1931 DOA: 06/02/2015   PCP: [name], MD  Specialists: Dr. Alen Blew is her oncologist.  Chief Complaint: Fever, altered mental status and weakness  HPI: [name] is a 82 y.o. female with a past medical history of multiple myeloma, history of atrial fibrillation not on anticoagulation, pancytopenia, COPD, hypertension, who was recently hospitalized in January for acute hypoxic respiratory failure. She subsequently was sent to a skilled nursing facility for short-term rehabilitation. She came back home about 2 weeks ago. Daughter did tell me that patient was treated for 10 days at the skilled nursing facility with antibiotics for pneumonia. Her daughter is the primary caregiver. However, she works and is not at home all the time. Since yesterday patient has been feeling weak. She had some confusion overnight. This morning she was very confused and lethargic. Noted to have a fever as well. So she was brought into the hospital for further management. Patient denies any nausea, vomiting. She has had an occasional cough but has been dry. Denies any dysuria. No diarrhea. Does have chronic back pain which has been unchanged. This morning it was very difficult to get the patient to the bathroom. She was very weak. The daughter could not safely take her to the bathroom so she had an episode of incontinence.   In the emergency department, patient was found to be febrile to 103F. Rest of her vital signs were stable. UA is noted to be abnormal. She will be hospitalized for further management.  Home Medications: Prior to Admission medications   Medication Sig Start Date End Date Taking? Authorizing Provider  acetaminophen (TYLENOL) 325 MG tablet Take 2 tablets (650 mg total) by mouth every 6 (six) hours as needed for mild pain (or Fever >/= 101). 09/13/14  Yes Reyne Dumas, MD  albuterol-ipratropium (COMBIVENT) 18-103  MCG/ACT inhaler Inhale 1-2 puffs into the lungs every 4 (four) hours as needed for wheezing or shortness of breath.   Yes Historical Provider, MD  amLODipine (NORVASC) 10 MG tablet Take 1 tablet (10 mg total) by mouth daily. 03/17/15  Yes Verlee Monte, MD  aspirin 325 MG tablet Take 325 mg by mouth daily.   Yes Historical Provider, MD  cholecalciferol (VITAMIN D) 1000 UNITS tablet Take 1,000 Units by mouth every morning.    Yes Historical Provider, MD  [employer] NASAL SPRAY 0.05 % nasal spray Place 2 sprays into both nostrils 2 (two) times daily as needed for congestion. As directed 05/06/15  Yes Historical Provider, MD  ezetimibe (ZETIA) 10 MG tablet Take 1 tablet (10 mg total) by mouth at bedtime. 05/25/14  Yes Hosie Poisson, MD  feeding supplement, ENSURE ENLIVE, (ENSURE ENLIVE) LIQD Take 237 mLs by mouth 3 (three) times daily between meals. 09/13/14  Yes Reyne Dumas, MD  gabapentin (NEURONTIN) 100 MG capsule Take 100 mg by mouth every morning.  05/19/15  Yes Historical Provider, MD  isosorbide mononitrate (IMDUR) 30 MG 24 hr tablet Take 1 tablet (30 mg total) by mouth daily. 12/26/14  Yes Belva Crome, MD  megestrol (MEGACE ORAL) 40 MG/ML suspension Take 10 mLs (400 mg total) by mouth 2 (two) times daily. 03/23/15  Yes Wyatt Portela, MD  metoprolol succinate (TOPROL-XL) 25 MG 24 hr tablet Take 1 tablet (25 mg total) by mouth daily. 12/26/14  Yes Belva Crome, MD  nitroGLYCERIN (NITROSTAT) 0.4 MG SL tablet Place 0.4 mg under the tongue every 5 (five) minutes as needed for chest  pain. Reported on 04/20/2015   Yes Historical Provider, MD  pantoprazole (PROTONIX) 40 MG tablet Take 1 tablet (40 mg total) by mouth daily. 05/06/15  Yes Robbie Lis, MD  polyethylene glycol powder (GLYCOLAX/MIRALAX) powder Take 17 g by mouth daily as needed for mild constipation or moderate constipation. Mix with 8 oz liquid and drink 05/16/14  Yes Historical Provider, MD  sennosides-docusate sodium (SENOKOT-S) 8.6-50 MG tablet Take 2  tablets by mouth daily as needed for constipation.   Yes Historical Provider, MD  tiotropium (SPIRIVA) 18 MCG inhalation capsule Place 18 mcg into inhaler and inhale daily as needed (shortness of breath).    Yes Historical Provider, MD  traMADol (ULTRAM) 50 MG tablet Take 1 tablet (50 mg total) by mouth every 6 (six) hours as needed. Patient taking differently: Take 50 mg by mouth every 6 (six) hours as needed for severe pain.  06/01/15  Yes Wyatt Portela, MD    Allergies:  Allergies  Allergen Reactions  . Lexapro [Escitalopram Oxalate] Other (See Comments)    Fatigue   . Soma [Carisoprodol] Other (See Comments)    Fatigue   . Wellbutrin [Bupropion] Other (See Comments)    "couldn't function and take it"  . Albuterol Sulfate Other (See Comments)    Albuterol HFA inhaler caused nervousness   . Codeine Hives  . Levaquin [Levofloxacin In D5w] Other (See Comments)    Unknown allergic reaction  . Plavix [Clopidogrel Bisulfate] Other (See Comments)    Unknown reaction  . Statins     Unknown reaction     Past Medical History: Past Medical History  Diagnosis Date  . COPD (chronic obstructive pulmonary disease) ([place])   . Gout   . Coronary atherosclerosis of native coronary artery 02/05/2012  . Hypertension   . High blood cholesterol   . Heart murmur     "all my life" (02/05/2012)  . Anginal pain ([place])   . Myocardial infarction ([place]) 1990's; 2000's    "2 total, I think" (02/05/2012)  . Pneumonia     "several times" (02/05/2012)  . Chronic bronchitis ([place])   . Shortness of breath     "sometimes when I'm laying down; always when I do too much" (02/05/2012)  . History of blood transfusion   . External bleeding hemorrhoids     "act up at times" (02/05/2012)  . Chronic back pain     "all over" (02/05/2012)  . Depression   . CAD (coronary artery disease) of artery bypass graft 02/09/14    atretic LIMA  . Multiple myeloma ([place]) 09/07/2014  . A-fib ([place]) 10/09/2014  . CKD (chronic  kidney disease)     Stage II    Past Surgical History  Procedure Laterality Date  . Appendectomy  ? 1970's  . Ectopic pregnancy surgery  1970's  . Abdominal hysterectomy  ? 1970's    "partial 1st time; complete 2nd" (02/05/2012)  . Cholecystectomy  ~ 2010  . Cataract extraction w/ intraocular lens  implant, bilateral  [phone]  . Coronary angioplasty with stent placement  2005  . Coronary artery bypass graft  2005    CABG X2  . Cardiac catheterization  02/09/14    atretic LIMA,  patent VG-dRCA and Total occlusion of the native RCA proximally prior to remotely placement RCA stent, with mild proximal 30% LAD stenosis and 30% distal circumflex stenoses.  EF 55%.  . Left heart catheterization with coronary/graft angiogram N/A 02/09/2014    Procedure: LEFT HEART CATHETERIZATION WITH CORONARY/GRAFT ANGIOGRAM;  Surgeon: Troy Sine, MD;  Location: [facility] CATH LAB;  Service: Cardiovascular;  Laterality: N/A;  . Esophagogastroduodenoscopy (egd) with propofol Left 05/22/2014    Procedure: ESOPHAGOGASTRODUODENOSCOPY (EGD) WITH PROPOFOL;  Surgeon: Arta Silence, MD;  Location: [facility] ENDOSCOPY;  Service: Endoscopy;  Laterality: Left;    Social History: Recently came home from skilled nursing facility about 2 weeks ago after completing short-term rehabilitation. Lives with her daughter. Quit smoking 4 years ago. No alcohol use. Uses a walker sometimes.  Family History:  Family History  Problem Relation Age of Onset  . Anemia Neg Hx   . Arrhythmia Neg Hx   . Asthma Neg Hx   . Clotting disorder Neg Hx   . Fainting Neg Hx   . Heart attack Neg Hx   . Heart disease Neg Hx   . Heart failure Neg Hx   . Hyperlipidemia Neg Hx   . Hypertension Neg Hx   . Aneurysm    . CVA    . CVA Mother   . Aneurysm Father      Review of Systems - unable to do due to encephalopathy  Physical Examination  Filed Vitals:   06/02/15 0707 06/02/15 0707 06/02/15 0840 06/02/15 0909  BP:   115/59   Pulse:   65     Temp: 103 F (39.4 C)   99.6 F (37.6 C)  TempSrc: Rectal   Rectal  Resp:   18   Weight:  57.607 kg (127 lb)    SpO2:   94%     BP 115/59 mmHg  Pulse 65  Temp(Src) 99.6 F (37.6 C) (Rectal)  Resp 18  Wt 57.607 kg (127 lb)  SpO2 94%  General appearance: alert, appears stated age, distracted and no distress Head: Normocephalic, without obvious abnormality, atraumatic Eyes: Iris defect noted in the left eye, most likely postsurgical. No icterus. Throat: lips, mucosa, and tongue normal; teeth and gums normal Neck: no adenopathy, no carotid bruit, no JVD, supple, symmetrical, trachea midline and thyroid not enlarged, symmetric, no tenderness/mass/nodules Resp: Few crackles at the bases, which showed diminished with deep breathing. No rhonchi, no wheezing. Cardio: regular rate and rhythm, S1, S2 normal, no murmur, click, rub or gallop GI: soft, non-tender; bowel sounds normal; no masses,  no organomegaly Extremities: extremities normal, atraumatic, no cyanosis or edema Pulses: 2+ and symmetric Skin: Skin color, texture, turgor normal. No rashes or lesions Lymph nodes: Cervical, supraclavicular, and axillary nodes normal. Neurologic: She is awake and alert. Slightly distracted. No focal neurological deficits. No facial asymmetry.  Laboratory Data: Results for orders placed or performed during the hospital encounter of 06/02/15 (from the past 48 hour(s))  Comprehensive metabolic panel     Status: Abnormal   Collection Time: 06/02/15  7:08 AM  Result Value Ref Range   Sodium 135 135 - 145 mmol/L   Potassium 4.1 3.5 - 5.1 mmol/L   Chloride 106 101 - 111 mmol/L   CO2 18 (L) 22 - 32 mmol/L   Glucose, Bld 152 (H) 65 - 99 mg/dL   BUN 33 (H) 6 - 20 mg/dL   Creatinine, Ser 1.46 (H) 0.44 - 1.00 mg/dL   Calcium 8.9 8.9 - 10.3 mg/dL   Total Protein 9.4 (H) 6.5 - 8.1 g/dL   Albumin 3.3 (L) 3.5 - 5.0 g/dL   AST 26 15 - 41 U/L   ALT 10 (L) 14 - 54 U/L   Alkaline Phosphatase 45 38 - 126  U/L   Total Bilirubin 0.7 0.3 -  1.2 mg/dL   GFR calc non Af Amer 33 (L) >60 mL/min   GFR calc Af Amer 38 (L) >60 mL/min    Comment: (NOTE) The eGFR has been calculated using the CKD EPI equation. This calculation has not been validated in all clinical situations. eGFR's persistently <60 mL/min signify possible Chronic Kidney Disease.    Anion gap 11 5 - 15  CBC with Differential     Status: Abnormal   Collection Time: 06/02/15  7:08 AM  Result Value Ref Range   WBC 2.5 (L) 4.0 - 10.5 K/uL   RBC 2.54 (L) 3.87 - 5.11 MIL/uL   Hemoglobin 7.8 (L) 12.0 - 15.0 g/dL   HCT 24.3 (L) 36.0 - 46.0 %   MCV 95.7 78.0 - 100.0 fL   MCH 30.7 26.0 - 34.0 pg   MCHC 32.1 30.0 - 36.0 g/dL   RDW 20.2 (H) 11.5 - 15.5 %   Platelets 95 (L) 150 - 400 K/uL    Comment: CONSISTENT WITH PREVIOUS RESULT   Neutrophils Relative % 70 %   Neutro Abs 1.7 1.7 - 7.7 K/uL   Lymphocytes Relative 15 %   Lymphs Abs 0.4 (L) 0.7 - 4.0 K/uL   Monocytes Relative 15 %   Monocytes Absolute 0.4 0.1 - 1.0 K/uL   Eosinophils Relative 0 %   Eosinophils Absolute 0.0 0.0 - 0.7 K/uL   Basophils Relative 0 %   Basophils Absolute 0.0 0.0 - 0.1 K/uL  I-stat chem 8, ed     Status: Abnormal   Collection Time: 06/02/15  7:17 AM  Result Value Ref Range   Sodium 139 135 - 145 mmol/L   Potassium 4.2 3.5 - 5.1 mmol/L   Chloride 108 101 - 111 mmol/L   BUN 31 (H) 6 - 20 mg/dL   Creatinine, Ser 1.30 (H) 0.44 - 1.00 mg/dL   Glucose, Bld 154 (H) 65 - 99 mg/dL   Calcium, Ion 1.13 1.13 - 1.30 mmol/L   TCO2 17 0 - 100 mmol/L   Hemoglobin 8.5 (L) 12.0 - 15.0 g/dL   HCT 25.0 (L) 36.0 - 46.0 %  Urinalysis, Routine w reflex microscopic (not at [facility])     Status: Abnormal   Collection Time: 06/02/15  7:55 AM  Result Value Ref Range   Color, Urine YELLOW YELLOW   APPearance HAZY (A) CLEAR   Specific Gravity, Urine 1.016 1.005 - 1.030   pH 7.5 5.0 - 8.0   Glucose, UA NEGATIVE NEGATIVE mg/dL   Hgb urine dipstick MODERATE (A) NEGATIVE    Bilirubin Urine NEGATIVE NEGATIVE   Ketones, ur NEGATIVE NEGATIVE mg/dL   Protein, ur >300 (A) NEGATIVE mg/dL   Nitrite NEGATIVE NEGATIVE   Leukocytes, UA TRACE (A) NEGATIVE  Urine microscopic-add on     Status: Abnormal   Collection Time: 06/02/15  7:55 AM  Result Value Ref Range   Squamous Epithelial / LPF NONE SEEN NONE SEEN   WBC, UA TOO NUMEROUS TO COUNT 0 - 5 WBC/hpf   RBC / HPF 6-30 0 - 5 RBC/hpf   Bacteria, UA MANY (A) NONE SEEN  I-Stat CG4 Lactic Acid, ED  (not at [facility])     Status: None   Collection Time: 06/02/15  9:44 AM  Result Value Ref Range   Lactic Acid, Venous 1.05 0.5 - 2.0 mmol/L    Radiology Reports: Dg Chest 2 View  06/02/2015  CLINICAL DATA:  82 year old female with a history of atrial fibrillation. Fever and confusion EXAM: CHEST -  2 VIEW COMPARISON:  Chest x-ray 05/02/2015, abdominal CT 03/12/2015 FINDINGS: Cardiomediastinal silhouette unchanged with cardiomegaly. Atherosclerotic calcifications of the aortic arch. Surgical changes of median sternotomy. Low lung volumes persist with asymmetric elevation the right hemidiaphragm. Linear opacities at the bilateral bases, unchanged from the comparison plain film. No pneumothorax. No confluent airspace disease. Configuration of the central vasculature unchanged. No displaced fracture. Configuration of thoracic vertebral bodies unchanged. Unremarkable appearance of the upper abdomen. IMPRESSION: Low lung volumes with linear opacities at the bilateral lung bases, unchanged from prior, potentially atelectasis/scarring, or atypical infection. Atherosclerosis with surgical changes of prior CABG. Signed, Dulcy Fanny. Earleen Newport, DO [facility] Radiology Electronically Signed   By: Corrie Mckusick D.O.   On: 06/02/2015 07:56     Problem List  Principal Problem:   Urinary tract infection Active Problems:   COPD (chronic obstructive pulmonary disease) (HCC)   Multiple myeloma (HCC)    Anemia in neoplastic disease   Pancytopenia due to antineoplastic chemotherapy (HCC)   A-fib, CHA2DS2-VASc score 7   Acute kidney injury superimposed on chronic kidney disease, stage II ([place])   Acute encephalopathy   Assessment: This is a 82 year old Caucasian female with past with history as mentioned earlier, especially multiple myeloma, not in remission and not being treated, who comes in with generalized weakness, fever and mental confusion. She is noted to be febrile in the ED. She has an abnormal UA. Chest x-ray is also noted to be abnormal but image does not appear to be too different from what it was a month ago. I think her presentation is primarily due to UTI. Influenza is always a possibility.  Plan: #1 acute encephalopathy: Most likely secondary to fever, dehydration. She is noted to have mild renal failure. Mental status has improved with IV hydration. Continue to monitor for now. She does not have any focal neurological deficits. Does not need any neuro imaging studies at this time.  #2 Fever with abnormal UA/UTI: Urine cultures will be followed up on. Continue ceftriaxone. Influenza PCR will be checked. Droplet precautions for now. Chest x-ray abnormalities noted, but image actually looks similar to what it was a month ago. Patient was recently treated for pneumonia at the skilled nursing facility. Changes noted on chest x-ray, could be also due to recently treated pneumonia. There is no strong indication to use different antibiotics than what we are using for her UTI. Lactic acid is normal. Low suspicion for sepsis at this time.  #3 history of multiple myeloma: Patient developed pancytopenia with Revlimid and so it was discontinued in January. At this time she is not getting treated. Her oncologist would like for her to improve from a general health status before considering further treatment. If she declines, then she may be a hospice candidate.   #4 history of atrial fibrillation  with a chads vascular score of 7: She is not on anticoagulation and just on aspirin due to her thrombocytopenia and previously determined high risk of bleeding. Continue beta blocker. Heart rate is well controlled.  #5 Pancytopenia due to chemotherapy: She is anemic. Hemoglobin close to baseline. Continue to monitor. The platelet counts also close to baseline. No bleeding.  #6 Mild acute renal failure in a patient with chronic kidney disease stage II: She'll be gently hydrated. Repeat labs tomorrow morning.  #7 History of COPD: Continue with her inhalers.   DVT Prophylaxis: SCDs Code Status: DO NOT RESUSCITATE based on my conversation with the patient and her daughter Family Communication:  Discussed with the patient and her daughter  Disposition Plan: Admit to MedSurg   Further management decisions will depend on results of further testing and patient's response to treatment.   [facility]  [facility] Pager [phone]  If 7PM-7AM, please contact night-coverage [email or web address] Password [facility]  06/02/2015, 9:55 AM

## 2015-06-02 NOTE — ED Notes (Signed)
Bed: WA09 Expected date:  Expected time:  Means of arrival:  Comments: EMS 

## 2015-06-02 NOTE — ED Provider Notes (Addendum)
CSN: [id number]     Arrival date & time 06/02/15  9532 History   First MD Initiated Contact with Patient 06/02/15 0700     Chief Complaint  Patient presents with  . Fever  . Altered Mental Status     (Consider location/radiation/quality/duration/timing/severity/associated sxs/prior Treatment) HPI .[name]... Level V caveat for altered mental status. Patient arrived via EMS from home with fever and altered mental status for 24 hours.. Apparently, she was seen by the oncologist yesterday for remission of multiple myeloma. Initial temperature was 103 in the emergency department. Family is not available to ask anymore historical questions. She has been diagnosed with a plasma cell disorder IgG Kappa and is currently on a treatment holiday. Past Medical History  Diagnosis Date  . COPD (chronic obstructive pulmonary disease) (HCC)   . Gout   . Coronary atherosclerosis of native coronary artery 02/05/2012  . Hypertension   . High blood cholesterol   . Heart murmur     "all my life" (02/05/2012)  . Anginal pain (HCC)   . Myocardial infarction (HCC) 1990's; 2000's    "2 total, I think" (02/05/2012)  . Pneumonia     "several times" (02/05/2012)  . Chronic bronchitis (HCC)   . Shortness of breath     "sometimes when I'm laying down; always when I do too much" (02/05/2012)  . History of blood transfusion   . External bleeding hemorrhoids     "act up at times" (02/05/2012)  . Chronic back pain     "all over" (02/05/2012)  . Depression   . CAD (coronary artery disease) of artery bypass graft 02/09/14    atretic LIMA  . Multiple myeloma (HCC) 09/07/2014  . A-fib (HCC) 10/09/2014  . CKD (chronic kidney disease)     Stage II   Past Surgical History  Procedure Laterality Date  . Appendectomy  ? 1970's  . Ectopic pregnancy surgery  1970's  . Abdominal hysterectomy  ? 1970's    "partial 1st time; complete 2nd" (02/05/2012)  . Cholecystectomy  ~ 2010  . Cataract extraction w/ intraocular lens   implant, bilateral  [phone]  . Coronary angioplasty with stent placement  2005  . Coronary artery bypass graft  2005    CABG X2  . Cardiac catheterization  02/09/14    atretic LIMA,  patent VG-dRCA and Total occlusion of the native RCA proximally prior to remotely placement RCA stent, with mild proximal 30% LAD stenosis and 30% distal circumflex stenoses.  EF 55%.  . Left heart catheterization with coronary/graft angiogram N/A 02/09/2014    Procedure: LEFT HEART CATHETERIZATION WITH Isabel Caprice;  Surgeon: Lennette Bihari, MD;  Location: [facility] CATH LAB;  Service: Cardiovascular;  Laterality: N/A;  . Esophagogastroduodenoscopy (egd) with propofol Left 05/22/2014    Procedure: ESOPHAGOGASTRODUODENOSCOPY (EGD) WITH PROPOFOL;  Surgeon: Willis Modena, MD;  Location: [facility] ENDOSCOPY;  Service: Endoscopy;  Laterality: Left;   Family History  Problem Relation Age of Onset  . Anemia Neg Hx   . Arrhythmia Neg Hx   . Asthma Neg Hx   . Clotting disorder Neg Hx   . Fainting Neg Hx   . Heart attack Neg Hx   . Heart disease Neg Hx   . Heart failure Neg Hx   . Hyperlipidemia Neg Hx   . Hypertension Neg Hx   . Aneurysm    . CVA    . CVA Mother   . Aneurysm Father    Social History  Substance Use Topics  . Smoking  status: Former Smoker -- 1.00 packs/day for 50 years    Types: Cigarettes    Quit date: 04/02/2011  . Smokeless tobacco: Never Used  . Alcohol Use: Yes     Comment: says she drinks wine once in a while   OB History    No data available     Review of Systems  Reason unable to perform ROS: AMS.      Allergies  Lexapro; Soma; Wellbutrin; Albuterol sulfate; Codeine; Levaquin; Plavix; and Statins  Home Medications   Prior to Admission medications   Medication Sig Start Date End Date Taking? Authorizing Provider  acetaminophen (TYLENOL) 325 MG tablet Take 2 tablets (650 mg total) by mouth every 6 (six) hours as needed for mild pain (or Fever >/= 101). 09/13/14  Yes  Reyne Dumas, MD  albuterol-ipratropium (COMBIVENT) 18-103 MCG/ACT inhaler Inhale 1-2 puffs into the lungs every 4 (four) hours as needed for wheezing or shortness of breath.   Yes Historical Provider, MD  amLODipine (NORVASC) 10 MG tablet Take 1 tablet (10 mg total) by mouth daily. 03/17/15  Yes Verlee Monte, MD  aspirin 325 MG tablet Take 325 mg by mouth daily.   Yes Historical Provider, MD  cholecalciferol (VITAMIN D) 1000 UNITS tablet Take 1,000 Units by mouth every morning.    Yes Historical Provider, MD  [employer] NASAL SPRAY 0.05 % nasal spray Place 2 sprays into both nostrils 2 (two) times daily as needed for congestion. As directed 05/06/15  Yes Historical Provider, MD  ezetimibe (ZETIA) 10 MG tablet Take 1 tablet (10 mg total) by mouth at bedtime. 05/25/14  Yes Hosie Poisson, MD  feeding supplement, ENSURE ENLIVE, (ENSURE ENLIVE) LIQD Take 237 mLs by mouth 3 (three) times daily between meals. 09/13/14  Yes Reyne Dumas, MD  gabapentin (NEURONTIN) 100 MG capsule Take 100 mg by mouth every morning.  05/19/15  Yes Historical Provider, MD  isosorbide mononitrate (IMDUR) 30 MG 24 hr tablet Take 1 tablet (30 mg total) by mouth daily. 12/26/14  Yes Belva Crome, MD  megestrol (MEGACE ORAL) 40 MG/ML suspension Take 10 mLs (400 mg total) by mouth 2 (two) times daily. 03/23/15  Yes Wyatt Portela, MD  metoprolol succinate (TOPROL-XL) 25 MG 24 hr tablet Take 1 tablet (25 mg total) by mouth daily. 12/26/14  Yes Belva Crome, MD  nitroGLYCERIN (NITROSTAT) 0.4 MG SL tablet Place 0.4 mg under the tongue every 5 (five) minutes as needed for chest pain. Reported on 04/20/2015   Yes Historical Provider, MD  pantoprazole (PROTONIX) 40 MG tablet Take 1 tablet (40 mg total) by mouth daily. 05/06/15  Yes Robbie Lis, MD  polyethylene glycol powder (GLYCOLAX/MIRALAX) powder Take 17 g by mouth daily as needed for mild constipation or moderate constipation. Mix with 8 oz liquid and drink 05/16/14  Yes Historical Provider, MD   sennosides-docusate sodium (SENOKOT-S) 8.6-50 MG tablet Take 2 tablets by mouth daily as needed for constipation.   Yes Historical Provider, MD  tiotropium (SPIRIVA) 18 MCG inhalation capsule Place 18 mcg into inhaler and inhale daily as needed (shortness of breath).    Yes Historical Provider, MD  traMADol (ULTRAM) 50 MG tablet Take 1 tablet (50 mg total) by mouth every 6 (six) hours as needed. Patient taking differently: Take 50 mg by mouth every 6 (six) hours as needed for severe pain.  06/01/15  Yes Wyatt Portela, MD   BP 115/59 mmHg  Pulse 65  Temp(Src) 103 F (39.4 C) (Rectal)  Resp  18  Wt 127 lb (57.607 kg)  SpO2 94% Physical Exam  Constitutional:  Pleasant, confused.  HENT:  Head: Normocephalic and atraumatic.  Eyes: Conjunctivae and EOM are normal. Pupils are equal, round, and reactive to light.  Neck: Normal range of motion. Neck supple.  Cardiovascular: Normal rate and regular rhythm.   Pulmonary/Chest: Effort normal and breath sounds normal.  Abdominal: Soft. Bowel sounds are normal.  Genitourinary:  No CVAT.  Musculoskeletal: Normal range of motion.  Neurological: She is alert.  Skin: Skin is warm and dry.  Psychiatric:  Flat affect  Nursing note and vitals reviewed.   ED Course  Procedures (including critical care time) Labs Review Labs Reviewed  COMPREHENSIVE METABOLIC PANEL - Abnormal; Notable for the following:    CO2 18 (*)    Glucose, Bld 152 (*)    BUN 33 (*)    Creatinine, Ser 1.46 (*)    Total Protein 9.4 (*)    Albumin 3.3 (*)    ALT 10 (*)    GFR calc non Af Amer 33 (*)    GFR calc Af Amer 38 (*)    All other components within normal limits  URINALYSIS, ROUTINE W REFLEX MICROSCOPIC (NOT AT [facility]) - Abnormal; Notable for the following:    APPearance HAZY (*)    Hgb urine dipstick MODERATE (*)    Protein, ur >300 (*)    Leukocytes, UA TRACE (*)    All other components within normal limits  CBC WITH DIFFERENTIAL/PLATELET - Abnormal; Notable  for the following:    WBC 2.5 (*)    RBC 2.54 (*)    Hemoglobin 7.8 (*)    HCT 24.3 (*)    RDW 20.2 (*)    Platelets 95 (*)    Lymphs Abs 0.4 (*)    All other components within normal limits  URINE MICROSCOPIC-ADD ON - Abnormal; Notable for the following:    Bacteria, UA MANY (*)    All other components within normal limits  I-STAT CHEM 8, ED - Abnormal; Notable for the following:    BUN 31 (*)    Creatinine, Ser 1.30 (*)    Glucose, Bld 154 (*)    Hemoglobin 8.5 (*)    HCT 25.0 (*)    All other components within normal limits  CULTURE, BLOOD (ROUTINE X 2)  CULTURE, BLOOD (ROUTINE X 2)  URINE CULTURE  I-STAT CG4 LACTIC ACID, ED    Imaging Review Dg Chest 2 View  06/02/2015  CLINICAL DATA:  82 year old female with a history of atrial fibrillation. Fever and confusion EXAM: CHEST - 2 VIEW COMPARISON:  Chest x-ray 05/02/2015, abdominal CT 03/12/2015 FINDINGS: Cardiomediastinal silhouette unchanged with cardiomegaly. Atherosclerotic calcifications of the aortic arch. Surgical changes of median sternotomy. Low lung volumes persist with asymmetric elevation the right hemidiaphragm. Linear opacities at the bilateral bases, unchanged from the comparison plain film. No pneumothorax. No confluent airspace disease. Configuration of the central vasculature unchanged. No displaced fracture. Configuration of thoracic vertebral bodies unchanged. Unremarkable appearance of the upper abdomen. IMPRESSION: Low lung volumes with linear opacities at the bilateral lung bases, unchanged from prior, potentially atelectasis/scarring, or atypical infection. Atherosclerosis with surgical changes of prior CABG. Signed, Dulcy Fanny. Earleen Newport, DO [facility] Electronically Signed   By: Corrie Mckusick D.O.   On: 06/02/2015 07:56   I have personally reviewed and evaluated these images and lab results as part of my medical decision-making.   EKG  Interpretation None  MDM   Final diagnoses:  Altered mental status, unspecified altered mental status type  UTI (lower urinary tract infection)    Patient is febrile. She does not appear septic. Urinalysis shows too numerous to count white cells. Suspect urine is source of infection. IV fluids, IV Rocephin. Urine culture and blood cultures pending.  Admit to general medicine.    Nat Christen, MD 06/02/15 6151  Nat Christen, MD 06/02/15 0930

## 2015-06-03 DIAGNOSIS — D61810 Antineoplastic chemotherapy induced pancytopenia: Secondary | ICD-10-CM

## 2015-06-03 DIAGNOSIS — J438 Other emphysema: Secondary | ICD-10-CM

## 2015-06-03 DIAGNOSIS — L8990 Pressure ulcer of unspecified site, unspecified stage: Secondary | ICD-10-CM

## 2015-06-03 DIAGNOSIS — T451X5A Adverse effect of antineoplastic and immunosuppressive drugs, initial encounter: Secondary | ICD-10-CM

## 2015-06-03 DIAGNOSIS — D630 Anemia in neoplastic disease: Secondary | ICD-10-CM

## 2015-06-03 DIAGNOSIS — I482 Chronic atrial fibrillation: Secondary | ICD-10-CM

## 2015-06-03 LAB — BASIC METABOLIC PANEL
Anion gap: 8 (ref 5–15)
BUN: 29 mg/dL — AB (ref 6–20)
CO2: 20 mmol/L — ABNORMAL LOW (ref 22–32)
CREATININE: 1.17 mg/dL — AB (ref 0.44–1.00)
Calcium: 8.4 mg/dL — ABNORMAL LOW (ref 8.9–10.3)
Chloride: 109 mmol/L (ref 101–111)
GFR calc Af Amer: 49 mL/min — ABNORMAL LOW (ref >60)
GFR, EST NON AFRICAN AMERICAN: 43 mL/min — AB (ref >60)
GLUCOSE: 102 mg/dL — AB (ref 65–99)
POTASSIUM: 4.3 mmol/L (ref 3.5–5.1)
SODIUM: 137 mmol/L (ref 135–145)

## 2015-06-03 LAB — CBC
HEMATOCRIT: 23.1 % — AB (ref 36.0–46.0)
Hemoglobin: 7.3 g/dL — ABNORMAL LOW (ref 12.0–15.0)
MCH: 30.5 pg (ref 26.0–34.0)
MCHC: 31.6 g/dL (ref 30.0–36.0)
MCV: 96.7 fL (ref 78.0–100.0)
PLATELETS: 73 10*3/uL — AB (ref 150–400)
RBC: 2.39 MIL/uL — ABNORMAL LOW (ref 3.87–5.11)
RDW: 20.3 % — AB (ref 11.5–15.5)
WBC: 2.3 10*3/uL — AB (ref 4.0–10.5)

## 2015-06-03 MED ORDER — BOOST PLUS PO LIQD
237.0000 mL | Freq: Three times a day (TID) | ORAL | Status: DC
  Administered 2015-06-03 – 2015-06-05 (×3): 237 mL via ORAL
  Filled 2015-06-03 (×8): qty 237

## 2015-06-03 NOTE — Evaluation (Signed)
Occupational Therapy Evaluation Patient Details Name: [name] MRN: [id number] DOB: 09-24-1931 Today's Date: 06/03/2015    History of Present Illness [name] is a 82 y.o. female with a past medical history of multiple myeloma, history of atrial fibrillation , pancytopenia, COPD, hypertension, who was recently hospitalized in January for acute hypoxic respiratory failure. DC from a skilled nursing facility about 2 weeks ago. Noted to have a fever, UTI, R/O flu.   Clinical Impression   Pt admitted with fever. Pt currently with functional limitations due to the deficits listed below (see OT Problem List).  Pt will benefit from skilled OT to increase their safety and independence with ADL and functional mobility for ADL to facilitate discharge to venue listed below.      Follow Up Recommendations  SNF          Precautions / Restrictions Precautions Precautions: Fall Precaution Comments: contact, monitor sats      Mobility Bed Mobility Overal bed mobility: Needs Assistance Bed Mobility: Supine to Sit     Supine to sit: Min assist     General bed mobility comments: extra time  Transfers Overall transfer level: Needs assistance   Transfers: Sit to/from Stand;Stand Pivot Transfers Sit to Stand: Mod assist Stand pivot transfers: Mod assist       General transfer comment: extra time, bear hug type stand and pivot, ablle to reach to armrests and take small pivott steps from bed to [facility] then to the recliner. Patient is weak and required steady assist to stand.    Balance Overall balance assessment: Needs assistance Sitting-balance support: No upper extremity supported;Feet supported Sitting balance-Leahy Scale: Fair     Standing balance support: During functional activity;Bilateral upper extremity supported Standing balance-Leahy Scale: Poor                              ADL Overall ADL's : Needs assistance/impaired     Grooming: Minimal  assistance;Sitting                   Toilet Transfer: Moderate assistance;RW;BSC;Comfort height toilet   Toileting- Clothing Manipulation and Hygiene: Sit to/from stand;Cueing for sequencing;Cueing for safety;Maximal assistance         General ADL Comments: pts daughter not able to provide 24/7. Will need STSNF               Pertinent Vitals/Pain Pain Assessment: No/denies pain     Hand Dominance     Extremity/Trunk Assessment Upper Extremity Assessment Upper Extremity Assessment: Generalized weakness   Lower Extremity Assessment Lower Extremity Assessment: Generalized weakness   Cervical / Trunk Assessment Cervical / Trunk Assessment: Kyphotic   Communication Communication Communication: HOH   Cognition Arousal/Alertness: Awake/alert Behavior During Therapy: WFL for tasks assessed/performed Overall Cognitive Status: Impaired/Different from baseline Area of Impairment: Orientation Orientation Level: Time;Disoriented to                            Home Living Family/patient expects to be discharged to:: Private residence Living Arrangements: Children Available Help at Discharge: Family;Available PRN/intermittently Type of Home: Mobile home Home Access: Stairs to enter Entrance Stairs-Number of Steps: 4 Entrance Stairs-Rails: Right;Left Home Layout: One level     Bathroom Shower/Tub: Tub/shower unit;Walk-in shower         Home Equipment: Gilford Rile - 2 wheels;Shower seat   Additional Comments: pt reports daughter works  Prior Functioning/Environment Level of Independence: Independent with assistive device(s)        Comments: uses RW PRN    OT Diagnosis: Generalized weakness   OT Problem List: Decreased strength;Decreased activity tolerance;Decreased safety awareness;Impaired balance (sitting and/or standing)   OT Treatment/Interventions: Self-care/ADL training;DME and/or AE instruction;Patient/family education    OT  Goals(Current goals can be found in the care plan section) Acute Rehab OT Goals Patient Stated Goal: did not state OT Goal Formulation: With patient Time For Goal Achievement: 06/10/15  OT Frequency: Min 2X/week              End of Session Nurse Communication: Mobility status  Activity Tolerance: Patient limited by fatigue Patient left: in bed;with call bell/phone within reach;with family/visitor present;with bed alarm set   Time: 1245-1302 OT Time Calculation (min): 17 min Charges:  OT General Charges $OT Visit: 1 Procedure OT Evaluation $OT Eval Low Complexity: 1 Procedure G-Codes:    Betsy Pries 2015-06-15, 1:22 PM

## 2015-06-03 NOTE — Progress Notes (Signed)
[facility] Progress Note   [name]  KPV:[id number]  DOB: Mar 03, 1932  DOA: 06/02/2015 PCP: [name], MD  Brief narrative: [name] is a 82 y.o. female with history of multiple myeloma, A. fib not on anticoagulation air, pancytopenia, COPD, hypertension who presents to the hospital with fever of 10 9F and confusion and weakness and is found to have a urinary tract infection.   Subjective: She has no complaints of fever chills dysuria abdominal pain cough or shortness of breath. Have spoken with her daughter over the phone who states that her mother still appears to be confused.  Assessment/Plan: Principal Problem:   Fever suspected be due to Urinary tract infection -Continue to treat with ceftriaxone and follow-up on cultures -Influenza swab is negative  Active Problems:    Acute encephalopathy -Likely secondary to underlying infection-follow closely for improvement    COPD (chronic obstructive pulmonary disease) -Is stable at this time    Multiple myeloma  -Revlimid was discontinued secondary to pancytopenia this past January  Pancytopenia due to antineoplastic chemotherapy  -Continue to follow-may consider transfusion if hemoglobin remains low    A-fib, CHA2DS2-VASc score 7 -She is on full dose aspirin-heart rate controlled    Acute kidney injury superimposed on chronic kidney disease, stage II  -Creatinine slightly improved and closer to her baseline today    Pressure ulcer - -Continue wound care   Antibiotics: Anti-infectives    Start     Dose/Rate Route Frequency Ordered Stop   06/03/15 0800  cefTRIAXone (ROCEPHIN) 1 g in dextrose 5 % 50 mL IVPB     1 g 100 mL/hr over 30 Minutes Intravenous Every 24 hours 06/02/15 1138     06/02/15 0900  cefTRIAXone (ROCEPHIN) 1 g in dextrose 5 % 50 mL IVPB     1 g 100 mL/hr over 30 Minutes Intravenous  Once 06/02/15 0855 06/02/15 1104     Code Status:     Code Status Orders        Start      Ordered   06/02/15 1139  Do not attempt resuscitation (DNR)   Continuous    Question Answer Comment  In the event of cardiac or respiratory ARREST Do not call a "code blue"   In the event of cardiac or respiratory ARREST Do not perform Intubation, CPR, defibrillation or ACLS   In the event of cardiac or respiratory ARREST Use medication by any route, position, wound care, and other measures to relive pain and suffering. May use oxygen, suction and manual treatment of airway obstruction as needed for comfort.      06/02/15 1138    Code Status History    Date Active Date Inactive Code Status Order ID Comments User Context   05/03/2015 12:09 AM 05/06/2015  2:40 PM Full Code [id number]  Reubin Milan, MD Inpatient   03/12/2015 11:04 PM 03/17/2015  4:46 PM Full Code [id number]  Gennaro Africa, MD ED   02/22/2015  9:46 AM 02/23/2015  7:26 PM DNR [id number]  Cristal Ford, DO Inpatient   02/21/2015 12:37 AM 02/22/2015  9:46 AM Full Code [id number]  Allyne Gee, MD ED   12/27/2014  1:05 AM 12/27/2014  9:40 PM DNR [id number]  Lavina Hamman, MD Inpatient   10/09/2014  6:30 PM 10/13/2014  8:18 PM DNR [id number]  Annita Brod, MD Inpatient   09/10/2014 10:06 AM 09/13/2014  5:54 PM Full Code [id number]  Alfonso Ramus, MD ED   08/30/2014  9:53 AM  08/31/2014  3:26 AM Full Code [id number]  Greggory Keen, MD HOV   08/13/2014  4:15 PM 08/17/2014  8:15 PM Full Code [id number]  Bonnielee Haff, MD Inpatient   05/20/2014 11:42 PM 05/25/2014  6:42 PM Full Code [id number]  Berle Mull, MD ED   02/09/2014  3:44 PM 02/11/2014  2:35 PM Full Code [id number]  Troy Sine, MD Inpatient   02/08/2014  5:17 PM 02/09/2014  3:44 PM Full Code [id number]  Isaiah Serge, NP ED   02/05/2012  6:25 PM 02/06/2012  2:09 PM Full Code [id number]  Payton Emerald, RN Inpatient     Family Communication: Discussed with daughter Disposition Plan: May need SNF as daughter is concerned about her living alone DVT prophylaxis: SCDs  (thrombocytopenia) Consultants:  Procedures:     Objective: Filed Weights   06/02/15 0707 06/02/15 1143  Weight: 57.607 kg (127 lb) 59.8 kg (131 lb 13.4 oz)    Intake/Output Summary (Last 24 hours) at 06/03/15 0804 Last data filed at 06/03/15 0445  Gross per 24 hour  Intake    480 ml  Output   1150 ml  Net   -670 ml     Vitals Filed Vitals:   06/02/15 1046 06/02/15 1143 06/02/15 2120 06/03/15 0440  BP: 133/53 151/51 141/54 167/55  Pulse: 69 65 60 56  Temp:  99 F (37.2 C) 99.5 F (37.5 C) 99.5 F (37.5 C)  TempSrc:  Oral Oral Oral  Resp: '18 18 16 16  '$ Height:  '5\' 2"'$  (1.575 m)    Weight:  59.8 kg (131 lb 13.4 oz)    SpO2: 94% 99% 95% 94%    Exam:  General:  Pt is alert, not in acute distress,Confused to time and place  HEENT: No icterus, No thrush, oral mucosa moist  Cardiovascular: regular rate and rhythm, S1/S2 No murmur  Respiratory: clear to auscultation bilaterally   Abdomen: Soft, +Bowel sounds, non tender, non distended, no guarding  MSK: No cyanosis or clubbing- no pedal edema   Data Reviewed: Basic Metabolic Panel:  Recent Labs Lab 06/01/15 1513 06/02/15 0708 06/02/15 0717 06/03/15 0343  NA 133* 135 139 137  K 3.9 4.1 4.2 4.3  CL  --  106 108 109  CO2 21* 18*  --  20*  GLUCOSE 82 152* 154* 102*  BUN 27.5* 33* 31* 29*  CREATININE 1.2* 1.46* 1.30* 1.17*  CALCIUM 9.2 8.9  --  8.4*   Liver Function Tests:  Recent Labs Lab 06/01/15 1513 06/02/15 0708  AST 15 26  ALT <9 10*  ALKPHOS 53 45  BILITOT 0.54 0.7  PROT 10.0* 9.4*  ALBUMIN 3.1* 3.3*   No results for input(s): LIPASE, AMYLASE in the last 168 hours. No results for input(s): AMMONIA in the last 168 hours. CBC:  Recent Labs Lab 06/01/15 1513 06/02/15 0708 06/02/15 0717 06/03/15 0343  WBC 4.2 2.5*  --  2.3*  NEUTROABS 2.8 1.7  --   --   HGB 8.3* 7.8* 8.5* 7.3*  HCT 25.4* 24.3* 25.0* 23.1*  MCV 94.1 95.7  --  96.7  PLT 98* 95*  --  73*   Cardiac Enzymes: No  results for input(s): CKTOTAL, CKMB, CKMBINDEX, TROPONINI in the last 168 hours. BNP (last 3 results)  Recent Labs  08/13/14 1216 10/09/14 1524  BNP 150.0* 537.4*    ProBNP (last 3 results) No results for input(s): PROBNP in the last 8760 hours.  CBG: No results for input(s): GLUCAP in the last 168 hours.  No results found for this or any previous visit (from the past 240 hour(s)).   Studies: Dg Chest 2 View  06/02/2015  CLINICAL DATA:  82 year old female with a history of atrial fibrillation. Fever and confusion EXAM: CHEST - 2 VIEW COMPARISON:  Chest x-ray 05/02/2015, abdominal CT 03/12/2015 FINDINGS: Cardiomediastinal silhouette unchanged with cardiomegaly. Atherosclerotic calcifications of the aortic arch. Surgical changes of median sternotomy. Low lung volumes persist with asymmetric elevation the right hemidiaphragm. Linear opacities at the bilateral bases, unchanged from the comparison plain film. No pneumothorax. No confluent airspace disease. Configuration of the central vasculature unchanged. No displaced fracture. Configuration of thoracic vertebral bodies unchanged. Unremarkable appearance of the upper abdomen. IMPRESSION: Low lung volumes with linear opacities at the bilateral lung bases, unchanged from prior, potentially atelectasis/scarring, or atypical infection. Atherosclerosis with surgical changes of prior CABG. Signed, Dulcy Fanny. Earleen Newport, DO [facility] Radiology Electronically Signed   By: Corrie Mckusick D.O.   On: 06/02/2015 07:56    Scheduled Meds:  Scheduled Meds: . amLODipine  10 mg Oral Daily  . aspirin  325 mg Oral Daily  . cefTRIAXone (ROCEPHIN)  IV  1 g Intravenous Q24H  . ezetimibe  10 mg Oral QHS  . feeding supplement (ENSURE ENLIVE)  237 mL Oral TID BM  . gabapentin  100 mg Oral q morning - 10a  . isosorbide mononitrate  30 mg Oral Daily  . megestrol  400 mg Oral BID  . metoprolol succinate  25 mg Oral  Daily  . pantoprazole  40 mg Oral Daily   Continuous Infusions: . sodium chloride 75 mL/hr at 06/02/15 1219    Time spent on care of this patient: 35 min   [place], MD 06/03/2015, 8:04 AM  LOS: 1 day   [facility] Office  [phone] Pager - Text Page per [email or web address] If 7PM-7AM, please contact night-coverage [email or web address]

## 2015-06-03 NOTE — Evaluation (Signed)
Physical Therapy Evaluation Patient Details Name: [name] MRN: [id number] DOB: 08-27-1931 Today's Date: 06/03/2015   History of Present Illness  [name] is a 82 y.o. female with a past medical history of multiple myeloma, history of atrial fibrillation , pancytopenia, COPD, hypertension, who was recently hospitalized in January for acute hypoxic respiratory failure. DC from a skilled nursing facility  about 2 weeks ago. Noted to have a fever, UTI, R/O flu.  Clinical Impression  Patient is very pleasant, no family present to discuss DC plan. Patient recently in SNF([facility]) Pt admitted with above diagnosis. Pt currently with functional limitations due to the deficits listed below (see PT Problem List).  Pt will benefit from skilled PT to increase their independence and safety with mobility to allow discharge to the venue listed below.      Follow Up Recommendations SNF;Supervision/Assistance - 24 hour    Equipment Recommendations  None recommended by PT    Recommendations for Other Services       Precautions / Restrictions Precautions Precautions: Fall Precaution Comments: contact, monitor sats      Mobility  Bed Mobility Overal bed mobility: Needs Assistance Bed Mobility: Supine to Sit     Supine to sit: Min assist     General bed mobility comments: extra time  Transfers Overall transfer level: Needs assistance   Transfers: Sit to/from Stand;Stand Pivot Transfers Sit to Stand: Mod assist Stand pivot transfers: Mod assist       General transfer comment: extra time, bear hug type stand and pivot, ablle to reach to armrests and take small pivott steps from bed to [facility] then to the recliner. Patient is weak and required steady assist to stand.  Ambulation/Gait                Stairs            Wheelchair Mobility    Modified Rankin (Stroke Patients Only)       Balance Overall balance assessment: Needs assistance Sitting-balance  support: No upper extremity supported;Feet supported Sitting balance-Leahy Scale: Fair     Standing balance support: During functional activity;Bilateral upper extremity supported Standing balance-Leahy Scale: Poor                               Pertinent Vitals/Pain Pain Assessment: No/denies pain    Home Living Family/patient expects to be discharged to:: Private residence Living Arrangements: Children Available Help at Discharge: Family;Available PRN/intermittently Type of Home: Mobile home Home Access: Stairs to enter Entrance Stairs-Rails: Right;Left Entrance Stairs-Number of Steps: 4 Home Layout: One level Home Equipment: Walker - 2 wheels;Shower seat Additional Comments: pt reports daughter works    Prior Function Level of Independence: Independent with assistive device(s)         Comments: uses RW PRN     Hand Dominance        Extremity/Trunk Assessment   Upper Extremity Assessment: Defer to OT evaluation           Lower Extremity Assessment: Generalized weakness      Cervical / Trunk Assessment: Kyphotic  Communication   Communication: HOH  Cognition Arousal/Alertness: Awake/alert   Overall Cognitive Status: No family/caregiver present to determine baseline cognitive functioning Area of Impairment: Orientation Orientation Level: Time;Disoriented to                  General Comments      Exercises  Assessment/Plan    PT Assessment Patient needs continued PT services  PT Diagnosis Difficulty walking;Generalized weakness;Altered mental status   PT Problem List Decreased strength;Decreased activity tolerance;Decreased balance;Decreased mobility;Decreased cognition;Decreased safety awareness;Decreased knowledge of precautions  PT Treatment Interventions DME instruction;Gait training;Functional mobility training;Therapeutic activities;Therapeutic exercise;Balance training;Patient/family education   PT Goals (Current  goals can be found in the Care Plan section) Acute Rehab PT Goals Patient Stated Goal: agreed to get up into chair PT Goal Formulation: Patient unable to participate in goal setting Time For Goal Achievement: 06/17/15 Potential to Achieve Goals: Fair    Frequency Min 3X/week   Barriers to discharge Decreased caregiver support daughter works    Co-evaluation               End of Session   Activity Tolerance: Patient limited by fatigue;Patient tolerated treatment well Patient left: in chair;with call bell/phone within reach;with chair alarm set Nurse Communication: Mobility status         Time: 0928-0950 PT Time Calculation (min) (ACUTE ONLY): 22 min   Charges:   PT Evaluation $PT Eval Low Complexity: 1 Procedure     PT G CodesClaretha [name] 06/03/2015, 1:06 PM [name] PT [phone]

## 2015-06-04 DIAGNOSIS — N390 Urinary tract infection, site not specified: Secondary | ICD-10-CM

## 2015-06-04 LAB — BASIC METABOLIC PANEL WITH GFR
Anion gap: 7 (ref 5–15)
BUN: 29 mg/dL — ABNORMAL HIGH (ref 6–20)
CO2: 20 mmol/L — ABNORMAL LOW (ref 22–32)
Calcium: 8.2 mg/dL — ABNORMAL LOW (ref 8.9–10.3)
Chloride: 106 mmol/L (ref 101–111)
Creatinine, Ser: 1.18 mg/dL — ABNORMAL HIGH (ref 0.44–1.00)
GFR calc Af Amer: 49 mL/min — ABNORMAL LOW
GFR calc non Af Amer: 42 mL/min — ABNORMAL LOW
Glucose, Bld: 113 mg/dL — ABNORMAL HIGH (ref 65–99)
Potassium: 4.2 mmol/L (ref 3.5–5.1)
Sodium: 133 mmol/L — ABNORMAL LOW (ref 135–145)

## 2015-06-04 LAB — CBC
HCT: 22.8 % — ABNORMAL LOW (ref 36.0–46.0)
Hemoglobin: 7.2 g/dL — ABNORMAL LOW (ref 12.0–15.0)
MCH: 30.1 pg (ref 26.0–34.0)
MCHC: 31.6 g/dL (ref 30.0–36.0)
MCV: 95.4 fL (ref 78.0–100.0)
Platelets: 82 K/uL — ABNORMAL LOW (ref 150–400)
RBC: 2.39 MIL/uL — ABNORMAL LOW (ref 3.87–5.11)
RDW: 20.1 % — ABNORMAL HIGH (ref 11.5–15.5)
WBC: 2.4 K/uL — ABNORMAL LOW (ref 4.0–10.5)

## 2015-06-04 LAB — URINE CULTURE

## 2015-06-04 LAB — KAPPA/LAMBDA LIGHT CHAINS
IG KAPPA FREE LIGHT CHAIN: 338.74 mg/L — AB (ref 3.30–19.40)
IG LAMBDA FREE LIGHT CHAIN: 3.01 mg/L — AB (ref 5.71–26.30)
KAPPA/LAMBDA FLC RATIO: 112.54 — AB (ref 0.26–1.65)

## 2015-06-04 LAB — HEMOGLOBIN AND HEMATOCRIT, BLOOD
HCT: 26.1 % — ABNORMAL LOW (ref 36.0–46.0)
HEMOGLOBIN: 8.4 g/dL — AB (ref 12.0–15.0)

## 2015-06-04 LAB — PREPARE RBC (CROSSMATCH)

## 2015-06-04 MED ORDER — SODIUM CHLORIDE 0.9 % IV SOLN
Freq: Once | INTRAVENOUS | Status: AC
Start: 2015-06-04 — End: 2015-06-04
  Administered 2015-06-04: 250 mL via INTRAVENOUS

## 2015-06-04 NOTE — Discharge Planning (Signed)
Patient is active with [facility] for physical therapy.  We are available to serve the inpatient team with service details or discharge assistance. Please contact me at [phone].

## 2015-06-04 NOTE — Progress Notes (Signed)
CSW continuing to follow.  CSW made second attempt to meet with pt re: SNF placement.   Pt continues to be asleep at this time. RN present in room and reports that pt did not sleep last night and has been asleep throughout the day. RN reports when pt has awoken today; pt would not have been able to appropriately participate in assessment.  CSW contacted pt daughter, Butch Penny via telephone and left voice message.  CSW to await call back from pt daughter to complete full psychosocial assessment and obtain permission for SNF serach.  CSW to continue to follow.  Alison Murray, MSW, [place] Work [phone]

## 2015-06-04 NOTE — Progress Notes (Signed)
[facility] Progress Note   [name]  AVW:[id number]  DOB: 11/10/1931  DOA: 06/02/2015 PCP: [name], MD  Brief narrative: [name] is a 82 y.o. female with history of multiple myeloma, A. fib not on anticoagulation air, pancytopenia, COPD, hypertension who presents to the hospital with fever of 10 30F and confusion and weakness and is found to have a urinary tract infection.   Subjective: She appears to be doing well this AM. Much more oriented today- She has no complaints of fever chills dysuria abdominal pain cough or shortness of breath.    Assessment/Plan: Principal Problem:   Fever suspected be due to Urinary tract infection - Have been treating with ceftriaxone - cultures reveal Proteus Mirabilis which is sensitive to Ceftriaxone- she has received 3 days of treatment which is sufficient for non-complicated UTI- will d/c Ceftriaxone today -Influenza swab is negative  Active Problems:    Acute encephalopathy -Likely secondary to underlying infection- improving  Anemia - last anemia panel last year was consistent with AOCD-anemia likely is secondary to Multiple myeloma - check hemoccults - will give 1 U PRBC today    COPD (chronic obstructive pulmonary disease) -Is stable at this time    Multiple myeloma  -Revlimid was discontinued secondary to pancytopenia this past January  Pancytopenia due to antineoplastic chemotherapy  -Continue to follow-     A-fib, CHA2DS2-VASc score 7 -She is on full dose aspirin-heart rate controlled    Acute kidney injury superimposed on chronic kidney disease, stage II  -Creatinine slightly improved and closer to her baseline today    Pressure ulcer - -Continue wound care   Antibiotics: Anti-infectives    Start     Dose/Rate Route Frequency Ordered Stop   06/03/15 0800  cefTRIAXone (ROCEPHIN) 1 g in dextrose 5 % 50 mL IVPB     1 g 100 mL/hr over 30 Minutes Intravenous Every 24 hours 06/02/15 1138     06/02/15 0900  cefTRIAXone (ROCEPHIN) 1 g in dextrose 5 % 50 mL IVPB     1 g 100 mL/hr over 30 Minutes Intravenous  Once 06/02/15 0855 06/02/15 1104     Code Status:     Code Status Orders        Start     Ordered   06/02/15 1139  Do not attempt resuscitation (DNR)   Continuous    Question Answer Comment  In the event of cardiac or respiratory ARREST Do not call a "code blue"   In the event of cardiac or respiratory ARREST Do not perform Intubation, CPR, defibrillation or ACLS   In the event of cardiac or respiratory ARREST Use medication by any route, position, wound care, and other measures to relive pain and suffering. May use oxygen, suction and manual treatment of airway obstruction as needed for comfort.      06/02/15 1138    Code Status History    Family Communication: Discussed with daughter Disposition Plan:  SNF recommended by PT- patient in agreement with this DVT prophylaxis: SCDs (thrombocytopenia) Consultants:  Procedures:     Objective: Filed Weights   06/02/15 0707 06/02/15 1143  Weight: 57.607 kg (127 lb) 59.8 kg (131 lb 13.4 oz)    Intake/Output Summary (Last 24 hours) at 06/04/15 1210 Last data filed at 06/04/15 0802  Gross per 24 hour  Intake    835 ml  Output   1600 ml  Net   -765 ml     Vitals Filed Vitals:   06/03/15 2007  06/04/15 0430 06/04/15 1121 06/04/15 1122  BP: 125/47 162/45 154/59 154/59  Pulse: 71 68  64  Temp: 98.2 F (36.8 C) 98.5 F (36.9 C)    TempSrc: Oral Oral    Resp: 16 16    Height:      Weight:      SpO2: 92% 99%      Exam:  General:  Pt is AAO x 3, not in acute distress   HEENT: No icterus, No thrush, oral mucosa moist  Cardiovascular: regular rate and rhythm, S1/S2 No murmur  Respiratory: clear to auscultation bilaterally   Abdomen: Soft, +Bowel sounds, non tender, non distended, no guarding  MSK: No cyanosis or clubbing- no pedal edema   Data Reviewed: Basic Metabolic Panel:  Recent Labs Lab  06/01/15 1513 06/02/15 0708 06/02/15 0717 06/03/15 0343 06/04/15 0354  NA 133* 135 139 137 133*  K 3.9 4.1 4.2 4.3 4.2  CL  --  106 108 109 106  CO2 21* 18*  --  20* 20*  GLUCOSE 82 152* 154* 102* 113*  BUN 27.5* 33* 31* 29* 29*  CREATININE 1.2* 1.46* 1.30* 1.17* 1.18*  CALCIUM 9.2 8.9  --  8.4* 8.2*   Liver Function Tests:  Recent Labs Lab 06/01/15 1513 06/02/15 0708  AST 15 26  ALT <9 10*  ALKPHOS 53 45  BILITOT 0.54 0.7  PROT 10.0* 9.4*  ALBUMIN 3.1* 3.3*   No results for input(s): LIPASE, AMYLASE in the last 168 hours. No results for input(s): AMMONIA in the last 168 hours. CBC:  Recent Labs Lab 06/01/15 1513 06/02/15 0708 06/02/15 0717 06/03/15 0343 06/04/15 0354  WBC 4.2 2.5*  --  2.3* 2.4*  NEUTROABS 2.8 1.7  --   --   --   HGB 8.3* 7.8* 8.5* 7.3* 7.2*  HCT 25.4* 24.3* 25.0* 23.1* 22.8*  MCV 94.1 95.7  --  96.7 95.4  PLT 98* 95*  --  73* 82*   Cardiac Enzymes: No results for input(s): CKTOTAL, CKMB, CKMBINDEX, TROPONINI in the last 168 hours. BNP (last 3 results)  Recent Labs  08/13/14 1216 10/09/14 1524  BNP 150.0* 537.4*    ProBNP (last 3 results) No results for input(s): PROBNP in the last 8760 hours.  CBG: No results for input(s): GLUCAP in the last 168 hours.  Recent Results (from the past 240 hour(s))  Culture, blood (routine x 2)     Status: None (Preliminary result)   Collection Time: 06/02/15  7:00 AM  Result Value Ref Range Status   Specimen Description BLOOD RIGHT HAND  Final   Special Requests BOTTLES DRAWN AEROBIC AND ANAEROBIC 5CC  Final   Culture   Final    NO GROWTH 1 DAY Performed at [facility]    Report Status PENDING  Incomplete  Culture, blood (routine x 2)     Status: None (Preliminary result)   Collection Time: 06/02/15  7:10 AM  Result Value Ref Range Status   Specimen Description BLOOD LEFT HAND  Final   Special Requests BOTTLES DRAWN AEROBIC AND ANAEROBIC 5CC  Final   Culture   Final    NO GROWTH  1 DAY Performed at [facility]    Report Status PENDING  Incomplete  Urine culture     Status: None   Collection Time: 06/02/15  7:55 AM  Result Value Ref Range Status   Specimen Description URINE, CATHETERIZED  Final   Special Requests NONE  Final   Culture   Final    >=  100,000 COLONIES/mL PROTEUS MIRABILIS Performed at [facility]    Report Status 06/04/2015 FINAL  Final   Organism ID, Bacteria PROTEUS MIRABILIS  Final      Susceptibility   Proteus mirabilis - MIC*    AMPICILLIN <=2 SENSITIVE Sensitive     CEFAZOLIN <=4 SENSITIVE Sensitive     CEFTRIAXONE <=1 SENSITIVE Sensitive     CIPROFLOXACIN <=0.25 SENSITIVE Sensitive     GENTAMICIN <=1 SENSITIVE Sensitive     IMIPENEM 2 SENSITIVE Sensitive     NITROFURANTOIN 128 RESISTANT Resistant     TRIMETH/SULFA <=20 SENSITIVE Sensitive     AMPICILLIN/SULBACTAM <=2 SENSITIVE Sensitive     PIP/TAZO <=4 SENSITIVE Sensitive     * >=100,000 COLONIES/mL PROTEUS MIRABILIS     Studies: No results found.  Scheduled Meds:  Scheduled Meds: . sodium chloride   Intravenous Once  . amLODipine  10 mg Oral Daily  . aspirin  325 mg Oral Daily  . cefTRIAXone (ROCEPHIN)  IV  1 g Intravenous Q24H  . ezetimibe  10 mg Oral QHS  . feeding supplement (ENSURE ENLIVE)  237 mL Oral TID BM  . gabapentin  100 mg Oral q morning - 10a  . isosorbide mononitrate  30 mg Oral Daily  . lactose free nutrition  237 mL Oral TID WC  . megestrol  400 mg Oral BID  . metoprolol succinate  25 mg Oral Daily  . pantoprazole  40 mg Oral Daily   Continuous Infusions:    Time spent on care of this patient: 35 min   [place], MD 06/04/2015, 12:10 PM  LOS: 2 days   [facility] Office  [phone] Pager - Text Page per [email or web address] If 7PM-7AM, please contact night-coverage [email or web address]

## 2015-06-04 NOTE — Progress Notes (Signed)
CSW received referral for New SNF.   CSW visited pt room. Pt sleeping at this time and did not awake to CSW entering room and calling pt name.   Per chart, pt oriented x 4. No family present.  CSW will follow up at a later time to complete full psychosocial assessment.   Alison Murray, MSW, [place] Work [phone]

## 2015-06-04 NOTE — NC FL2 (Signed)
[place] MEDICAID FL2 LEVEL OF CARE SCREENING TOOL     IDENTIFICATION  Patient Name: [name] Birthdate: 06-04-1931 Sex: female Admission Date (Current Location): 06/02/2015  [facility] and [place] Number:  [place] and Address:  [facility],  [place] [address] [place]      Provider Number: [id number]  Attending Physician Name and Address:  Debbe Odea, MD  Relative Name and Phone Number:       Current Level of Care: Hospital Recommended Level of Care: [place] Prior Approval Number:    Date Approved/Denied:   PASRR Number: [id number]  Discharge Plan: SNF    Current Diagnoses: Patient Active Problem List   Diagnosis Date Noted  . UTI (lower urinary tract infection)   . Acute encephalopathy 06/02/2015  . Urinary tract infection 06/02/2015  . Pressure ulcer 06/02/2015  . Malnutrition of moderate degree 05/05/2015  . Hypomagnesemia 05/04/2015  . Acute kidney injury superimposed on chronic kidney disease, stage II ([place]) 05/03/2015  . Hyponatremia 05/03/2015  . Weakness of both lower extremities 05/03/2015  . Hypoxia 05/02/2015  . Pancytopenia due to antineoplastic chemotherapy ([place]) 10/09/2014  . A-fib, CHA2DS2-VASc score 7 10/09/2014  . Bradycardia 09/10/2014  . Anemia in neoplastic disease   . Multiple myeloma ([place]) 09/07/2014  . Protein-calorie malnutrition, severe ([place]) 05/21/2014  . CAD (coronary artery disease) of artery bypass graft, atretic [place] 02/10/2014  . COPD (chronic obstructive pulmonary disease) ([place])   . GERD (gastroesophageal reflux disease) 02/05/2012    Orientation RESPIRATION BLADDER Height & Weight     Self, Time, Situation, Place  Normal Continent Weight: 131 lb 13.4 oz (59.8 kg) Height:  '5\' 2"'$  (157.5 cm)  BEHAVIORAL SYMPTOMS/MOOD NEUROLOGICAL BOWEL NUTRITION STATUS   (no behaviors)  (NONE) Continent Diet (Diet Heart)  AMBULATORY STATUS COMMUNICATION OF NEEDS Skin   Extensive  Assist Verbally PU Stage and Appropriate Care (Stage I to Sacrum) PU Stage 1 Dressing: Daily                     Personal Care Assistance Level of Assistance  Bathing, Feeding, Dressing Bathing Assistance: Limited assistance Feeding assistance: Independent Dressing Assistance: Limited assistance     Functional Limitations Info  Sight, Hearing, Speech Sight Info: Adequate Hearing Info: Adequate Speech Info: Adequate    SPECIAL CARE FACTORS FREQUENCY  PT (By licensed PT), OT (By licensed OT)     PT Frequency: 5 x a week OT Frequency: 5 x a week            Contractures Contractures Info: Not present    Additional Factors Info  Isolation Precautions Code Status Info: DNR code status Allergies Info: Lexapro, Soma, Wellbutrin, Albuterol Sulfate, Codeine, Levaquin, Plavix, Statins     Isolation Precautions Info: Contact Precautions: MDRO     Current Medications (06/04/2015):  This is the current hospital active medication list Current Facility-Administered Medications  Medication Dose Route Frequency Provider Last Rate Last Dose  . 0.9 %  sodium chloride infusion   Intravenous Once Debbe Odea, MD      . acetaminophen (TYLENOL) tablet 650 mg  650 mg Oral Q6H PRN Bonnielee Haff, MD       Or  . acetaminophen (TYLENOL) suppository 650 mg  650 mg Rectal Q6H PRN Bonnielee Haff, MD      . amLODipine (NORVASC) tablet 10 mg  10 mg Oral Daily Bonnielee Haff, MD   10 mg at 06/04/15 1121  . aspirin tablet 325 mg  325 mg Oral Daily Bonnielee Haff, MD   325 mg at 06/04/15 1121  . ezetimibe (ZETIA) tablet 10 mg  10 mg Oral QHS Bonnielee Haff, MD   10 mg at 06/03/15 2234  . feeding supplement (ENSURE ENLIVE) (ENSURE ENLIVE) liquid 237 mL  237 mL Oral TID BM Bonnielee Haff, MD   237 mL at 06/03/15 1400  . gabapentin (NEURONTIN) capsule 100 mg  100 mg Oral q morning - 10a Bonnielee Haff, MD   100 mg at 06/04/15 1121  . isosorbide mononitrate (IMDUR) 24 hr tablet 30 mg  30 mg Oral Daily  Bonnielee Haff, MD   30 mg at 06/04/15 1123  . lactose free nutrition (BOOST PLUS) liquid 237 mL  237 mL Oral TID WC Debbe Odea, MD   237 mL at 06/03/15 1145  . megestrol (MEGACE) 40 MG/ML suspension 400 mg  400 mg Oral BID Bonnielee Haff, MD   400 mg at 06/04/15 1120  . metoprolol succinate (TOPROL-XL) 24 hr tablet 25 mg  25 mg Oral Daily Bonnielee Haff, MD   25 mg at 06/04/15 1122  . ondansetron (ZOFRAN) tablet 4 mg  4 mg Oral Q6H PRN Bonnielee Haff, MD       Or  . ondansetron (ZOFRAN) injection 4 mg  4 mg Intravenous Q6H PRN Bonnielee Haff, MD      . pantoprazole (PROTONIX) EC tablet 40 mg  40 mg Oral Daily Bonnielee Haff, MD   40 mg at 06/04/15 1121  . polyethylene glycol (MIRALAX / GLYCOLAX) packet 17 g  17 g Oral Daily PRN Bonnielee Haff, MD      . tiotropium (SPIRIVA) inhalation capsule 18 mcg  18 mcg Inhalation Daily PRN Bonnielee Haff, MD      . traMADol Veatrice Bourbon) tablet 50 mg  50 mg Oral Q6H PRN Bonnielee Haff, MD   50 mg at 06/03/15 9295     Discharge Medications: Please see discharge summary for a list of discharge medications.  Relevant Imaging Results:  Relevant Lab Results:   Additional Information SSN: [id number]. Pt takes oral chemo- megace.  Sennie Borden A, LCSW

## 2015-06-04 NOTE — Care Management Note (Signed)
Case Management Note  Patient Details  Name: [name] MRN: [id number] Date of Birth: April 13, 1931  Subjective/Objective:83 y/o f admitted w/Acute encephalopathy.From home.Recent admission from SNF. PT cons.                    Action/Plan:d/c plan SNF.   Expected Discharge Date:   (UNKNOWN)               Expected Discharge Plan:  Skilled Nursing Facility  In-House Referral:  Clinical Social Work  Discharge planning Services  CM Consult  Post Acute Care Choice:    Choice offered to:     DME Arranged:    DME Agency:     HH Arranged:    [place] Agency:     Status of Service:  In process, will continue to follow  Medicare Important Message Given:    Date Medicare IM Given:    Medicare IM give by:    Date Additional Medicare IM Given:    Additional Medicare Important Message give by:     If discussed at [place] of Stay Meetings, dates discussed:    Additional Comments:  Dessa Phi, RN 06/04/2015, 2:28 PM

## 2015-06-04 NOTE — Progress Notes (Signed)
Pt given miralax, hasn't had BM in 3 days. Using incentive spirometer-1000.[name] Pt sleeping most the day, not wanting to get up.

## 2015-06-05 LAB — CBC
HCT: 28 % — ABNORMAL LOW (ref 36.0–46.0)
Hemoglobin: 9.3 g/dL — ABNORMAL LOW (ref 12.0–15.0)
MCH: 30.8 pg (ref 26.0–34.0)
MCHC: 33.2 g/dL (ref 30.0–36.0)
MCV: 92.7 fL (ref 78.0–100.0)
PLATELETS: 88 10*3/uL — AB (ref 150–400)
RBC: 3.02 MIL/uL — AB (ref 3.87–5.11)
RDW: 18.9 % — AB (ref 11.5–15.5)
WBC: 2.7 10*3/uL — AB (ref 4.0–10.5)

## 2015-06-05 LAB — MULTIPLE MYELOMA PANEL, SERUM
ALBUMIN SERPL ELPH-MCNC: 3.5 g/dL (ref 2.9–4.4)
ALPHA 1: 0.3 g/dL (ref 0.0–0.4)
ALPHA2 GLOB SERPL ELPH-MCNC: 1.1 g/dL — AB (ref 0.4–1.0)
Albumin/Glob SerPl: 0.7 (ref 0.7–1.7)
B-GLOBULIN SERPL ELPH-MCNC: 1.1 g/dL (ref 0.7–1.3)
GAMMA GLOB SERPL ELPH-MCNC: 3.3 g/dL — AB (ref 0.4–1.8)
Globulin, Total: 5.7 g/dL — ABNORMAL HIGH (ref 2.2–3.9)
IgA, Qn, Serum: 24 mg/dL — ABNORMAL LOW (ref 64–422)
IgM, Qn, Serum: 6 mg/dL — ABNORMAL LOW (ref 26–217)
M PROTEIN SERPL ELPH-MCNC: 3.1 g/dL — AB
Total Protein: 9.2 g/dL — ABNORMAL HIGH (ref 6.0–8.5)

## 2015-06-05 LAB — TYPE AND SCREEN
ABO/RH(D): A NEG
Antibody Screen: NEGATIVE
UNIT DIVISION: 0

## 2015-06-05 LAB — BASIC METABOLIC PANEL
ANION GAP: 6 (ref 5–15)
BUN: 28 mg/dL — ABNORMAL HIGH (ref 6–20)
CALCIUM: 8.7 mg/dL — AB (ref 8.9–10.3)
CO2: 21 mmol/L — ABNORMAL LOW (ref 22–32)
Chloride: 113 mmol/L — ABNORMAL HIGH (ref 101–111)
Creatinine, Ser: 1.08 mg/dL — ABNORMAL HIGH (ref 0.44–1.00)
GFR, EST AFRICAN AMERICAN: 54 mL/min — AB (ref >60)
GFR, EST NON AFRICAN AMERICAN: 47 mL/min — AB (ref >60)
Glucose, Bld: 128 mg/dL — ABNORMAL HIGH (ref 65–99)
POTASSIUM: 4.5 mmol/L (ref 3.5–5.1)
Sodium: 140 mmol/L (ref 135–145)

## 2015-06-05 MED ORDER — BOOST PLUS PO LIQD: 237.0000 mL | Freq: Three times a day (TID) | ORAL | Status: AC

## 2015-06-05 MED ORDER — TRAMADOL HCL 50 MG PO TABS: 50.0000 mg | ORAL_TABLET | Freq: Four times a day (QID) | ORAL | Status: AC | PRN

## 2015-06-05 NOTE — Progress Notes (Signed)
Pt for discharge to [facility].   CSW facilitated pt discharge needs including contacting facility, faxing pt discharge information via epic hub, discussing with pt at bedside and notifying pt daughter, Butch Penny via telephone, providing RN phone number to call report, and arranging ambulance transport via [place] for pt to [facility].  Pt and pt family coping appropriately with transition to [facility].  No further social work needs identified at this time.  CSW signing off.   Alison Murray, MSW, [place] Work [phone]

## 2015-06-05 NOTE — Care Management Note (Signed)
Case Management Note  Patient Details  Name: BREIONNA CHUGH MRN: [id number] Date of Birth: July 31, 1931  Subjective/Objective:                    Action/Plan:d/c SNF.   Expected Discharge Date:   (UNKNOWN)               Expected Discharge Plan:  Skilled Nursing Facility  In-House Referral:  Clinical Social Work  Discharge planning Services  CM Consult  Post Acute Care Choice:    Choice offered to:     DME Arranged:    DME Agency:     HH Arranged:    [place] Agency:     Status of Service:  Completed, signed off  Medicare Important Message Given:    Date Medicare IM Given:    Medicare IM give by:    Date Additional Medicare IM Given:    Additional Medicare Important Message give by:     If discussed at [place] of Stay Meetings, dates discussed:    Additional Comments:  Dessa Phi, RN 06/05/2015, 10:48 AM

## 2015-06-05 NOTE — Clinical Social Work Placement (Signed)
   CLINICAL SOCIAL WORK PLACEMENT  NOTE  Date:  06/05/2015  Patient Details  Name: [name] MRN: [id number] Date of Birth: 1932/01/04  Clinical Social Work is seeking post-discharge placement for this patient at the [place] level of care (*CSW will initial, date and re-position this form in  chart as items are completed):  Yes   Patient/family provided with [facility] Work Department's list of facilities offering this level of care within the geographic area requested by the patient (or if unable, by the patient's family).  Yes   Patient/family informed of their freedom to choose among providers that offer the needed level of care, that participate in Medicare, Medicaid or managed care program needed by the patient, have an available bed and are willing to accept the patient.  Yes   Patient/family informed of [facility]'s ownership interest in [facility] and [facility], as well as of the fact that they are under no obligation to receive care at these facilities.  PASRR submitted to EDS on       PASRR number received on       Existing PASRR number confirmed on 06/05/15     FL2 transmitted to all facilities in geographic area requested by pt/family on 06/05/15     FL2 transmitted to all facilities within larger geographic area on       Patient informed that his/her managed care company has contracts with or will negotiate with certain facilities, including the following:        Yes   Patient/family informed of bed offers received.  Patient chooses bed at [place], [place]     Physician recommends and patient chooses bed at      Patient to be transferred to [facility] on 06/05/15.  Patient to be transferred to facility by ambulance [facility])     Patient family notified on 06/05/15 of transfer.  Name of family member notified:  pt notified at bedside and pt daughter, Butch Penny notified via telephone      PHYSICIAN Please sign FL2, Please sign DNR     Additional Comment:    _______________________________________________ Ladell Pier, LCSW 06/05/2015, 12:29 PM

## 2015-06-05 NOTE — Care Management Important Message (Signed)
Important Message  Patient Details Important Message  Patient Details IM Letter given to Kathy/Case Manager to present to Patient Name: [name] MRN: [id number] Date of Birth: 02-Oct-1931   Medicare Important Message Given:  Yes    Camillo Flaming 06/05/2015, 12:18 PM Name: [name] MRN: [id number] Date of Birth: [birth year]   Medicare Important Message Given:  Yes    Camillo Flaming 06/05/2015, 12:18 PM

## 2015-06-05 NOTE — Clinical Social Work Note (Signed)
Clinical Social Work Assessment  Patient Details  Name: [name] MRN: [id number] Date of Birth: 03/20/1932  Date of referral:  06/05/15               Reason for consult:  Discharge Planning                Permission sought to share information with:  Family Supports Permission granted to share information::     Name::     Mauricio Po  Agency::     Relationship::  daughter  Contact Information:  [phone]  Housing/Transportation Living arrangements for the past 2 months:  Single Family Home Source of Information:  Adult Children Patient Interpreter Needed:  None Criminal Activity/Legal Involvement Pertinent to Current Situation/Hospitalization:  No - Comment as needed Significant Relationships:  Adult Children Lives with:  Adult Children Do you feel safe going back to the place where you live?  No Need for family participation in patient care:  Yes (Comment)  Care giving concerns:  Pt admitted from home with pt daughter. Pt daughter works during the day and pt needing rehab before returning home.   Social Worker assessment / plan:    CSW received referral for New SNF.  CSW attempted to meet with pt throughout the day, but pt sleeping. RN reports pt did not get much sleep during the night.  CSW spoke with pt daughter, Butch Penny via telephone. CSW discussed recommendation for SNF. Pt daughter agreeable as pt is alone during the day. Pt daughter discussed that pt has been to [facility] in the past and pt daughter prefers for pt to return to [facility].   CSW completed FL2 and initiated SNF search to [facility]. CSW contacted [facility] and confirmed facility can accept pt when medically ready tomorrow.   CSW to continue to follow to provide support and assist with pt discharge needs.   Employment status:  Retired Forensic scientist:    PT Recommendations:  [place] / Referral to community resources:  [place]  Patient/Family's Response to care:  Pt daughter supportive and actively involved in pt care. Pt daughter agrees pt needing rehab at [facility] and wants pt to go to [facility] as pt was recently at facility.  Patient/Family's Understanding of and Emotional Response to Diagnosis, Current Treatment, and Prognosis:  Pt daughter displayed understanding surrounding pt diagnosis and treatment plan.   Emotional Assessment Appearance:  Appears stated age Attitude/Demeanor/Rapport:  Unable to Assess Affect (typically observed):  Unable to Assess Orientation:  Oriented to Self, Oriented to Place, Oriented to  Time, Oriented to Situation Alcohol / Substance use:  Not Applicable Psych involvement (Current and /or in the community):  No (Comment)  Discharge Needs  Concerns to be addressed:  Discharge Planning Concerns Readmission within the last 30 days:  No Current discharge risk:   (home alone during the day) Barriers to Discharge:  Continued Medical Work up   Ladell Pier, [place] 06/05/2015, 9:38 AM  [phone]

## 2015-06-05 NOTE — Discharge Summary (Signed)
Physician Discharge Summary  [name] [id number] DOB: [birth year] DOA: 06/02/2015  PCP: [name], MD  Admit date: 06/02/2015 Discharge date: 06/05/2015  Time spent: 55 minutes  Recommendations for Outpatient Follow-up:  1. Stool hemoccults x 3 2. Will be going to SNF for Rehab  Discharge Condition: stable    Discharge Diagnoses:  Principal Problem:   Urinary tract infection Active Problems:   Acute encephalopathy   COPD (chronic obstructive pulmonary disease) (HCC)   Multiple myeloma (HCC)   Anemia in neoplastic disease   Pancytopenia due to antineoplastic chemotherapy (HCC)   A-fib, CHA2DS2-VASc score 7   Acute kidney injury superimposed on chronic kidney disease, stage II (HCC)   Pressure ulcer   History of present illness:  [name] is a 82 y.o. female with history of multiple myeloma, A. fib not on anticoagulation air, pancytopenia, COPD, hypertension who presents to the hospital with fever of 103F and confusion and weakness and is found to have a urinary tract infection.   Hospital Course:  Principal Problem:  Fever suspected be due to Urinary tract infection - Have been treating with ceftriaxone - cultures reveal Proteus Mirabilis which is sensitive to Ceftriaxone which she has been recieving - she has received 3 days of treatment which is sufficient for a non-complicated UTI - currently asymptomatic - no dysuria, suprapubic pain hematuria- fevers and confusion resolved   - her daugter does mention that her mother is not changing her incontinence pads frequently enough and appears to be sitting in urine most of the day- this may be the cause of the UTI- have discussed with patient and daughter in detail about changing pads frequently and maintaining good hygiene to prevent further UTIs -Influenza swab is negative  Active Problems:   Acute encephalopathy -Likely secondary to underlying infection- has resolved completely  Anemia - last anemia  panel last year was consistent with AOCD-anemia likely is secondary to Multiple myeloma - I did order hemoccults but these have not yet been done - given 1 U PRBC on 2/27 with improvement in Hb to 8.4 after the transfusion yesterday- upto 9.3 today   COPD (chronic obstructive pulmonary disease) -Is stable at this time   Multiple myeloma  -Revlimid was discontinued secondary to pancytopenia this past January  Pancytopenia due to antineoplastic chemotherapy  -Continue to follow- no need for platelet transfusion at this time   A-fib, CHA2DS2-VASc score 7 -She is on full dose aspirin-heart rate controlled   Acute kidney injury superimposed on chronic kidney disease, stage II  - prerenal ? Secondary to UTI/ cystitis?  - Creatinine improved and now at her baseline     Pressure ulcer - -Continue wound care    Procedures:none Consultations:  None   Discharge Exam: [facility] Weights   06/02/15 0707 06/02/15 1143  Weight: 57.607 kg (127 lb) 59.8 kg (131 lb 13.4 oz)   Filed Vitals:   06/04/15 2313 06/05/15 0454  BP: 165/60 141/88  Pulse: 55 50  Temp: 98 F (36.7 C) 97.9 F (36.6 C)  Resp: 20 18    General: AAO x 3, no distress Cardiovascular: RRR, no murmurs  Respiratory: clear to auscultation bilaterally GI: soft, non-tender, non-distended, bowel sound positive  Discharge Instructions You were cared for by a hospitalist during your hospital stay. If you have any questions about your discharge medications or the care you received while you were in the hospital after you are discharged, you can call the unit and asked to speak with the hospitalist on  call if the hospitalist that took care of you is not available. Once you are discharged, your primary care physician will handle any further medical issues. Please note that NO REFILLS for any discharge medications will be authorized once you are discharged, as it is imperative that you return to your primary care physician (or  establish a relationship with a primary care physician if you do not have one) for your aftercare needs so that they can reassess your need for medications and monitor your lab values.     Medication List    ASK your doctor about these medications        acetaminophen 325 MG tablet  Commonly known as:  TYLENOL  Take 2 tablets (650 mg total) by mouth every 6 (six) hours as needed for mild pain (or Fever >/= 101).     albuterol-ipratropium 18-103 MCG/ACT inhaler  Commonly known as:  COMBIVENT  Inhale 1-2 puffs into the lungs every 4 (four) hours as needed for wheezing or shortness of breath.     amLODipine 10 MG tablet  Commonly known as:  NORVASC  Take 1 tablet (10 mg total) by mouth daily.     aspirin 325 MG tablet  Take 325 mg by mouth daily.     cholecalciferol 1000 units tablet  Commonly known as:  VITAMIN D  Take 1,000 Units by mouth every morning.     [employer] NASAL SPRAY 0.05 % nasal spray  Generic drug:  oxymetazoline  Place 2 sprays into both nostrils 2 (two) times daily as needed for congestion. As directed     ezetimibe 10 MG tablet  Commonly known as:  ZETIA  Take 1 tablet (10 mg total) by mouth at bedtime.     feeding supplement (ENSURE ENLIVE) Liqd  Take 237 mLs by mouth 3 (three) times daily between meals.     gabapentin 100 MG capsule  Commonly known as:  NEURONTIN  Take 100 mg by mouth every morning.     isosorbide mononitrate 30 MG 24 hr tablet  Commonly known as:  IMDUR  Take 1 tablet (30 mg total) by mouth daily.     megestrol 40 MG/ML suspension  Commonly known as:  MEGACE ORAL  Take 10 mLs (400 mg total) by mouth 2 (two) times daily.     metoprolol succinate 25 MG 24 hr tablet  Commonly known as:  TOPROL-XL  Take 1 tablet (25 mg total) by mouth daily.     nitroGLYCERIN 0.4 MG SL tablet  Commonly known as:  NITROSTAT  Place 0.4 mg under the tongue every 5 (five) minutes as needed for chest pain. Reported on 04/20/2015     pantoprazole 40 MG  tablet  Commonly known as:  PROTONIX  Take 1 tablet (40 mg total) by mouth daily.     polyethylene glycol powder powder  Commonly known as:  GLYCOLAX/MIRALAX  Take 17 g by mouth daily as needed for mild constipation or moderate constipation. Mix with 8 oz liquid and drink     sennosides-docusate sodium 8.6-50 MG tablet  Commonly known as:  SENOKOT-S  Take 2 tablets by mouth daily as needed for constipation.     tiotropium 18 MCG inhalation capsule  Commonly known as:  SPIRIVA  Place 18 mcg into inhaler and inhale daily as needed (shortness of breath).     traMADol 50 MG tablet  Commonly known as:  ULTRAM  Take 1 tablet (50 mg total) by mouth every 6 (six) hours as needed.  Allergies  Allergen Reactions  . Lexapro [Escitalopram Oxalate] Other (See Comments)    Fatigue   . Soma [Carisoprodol] Other (See Comments)    Fatigue   . Wellbutrin [Bupropion] Other (See Comments)    "couldn't function and take it"  . Albuterol Sulfate Other (See Comments)    Albuterol HFA inhaler caused nervousness   . Codeine Hives  . Levaquin [Levofloxacin In D5w] Other (See Comments)    Unknown allergic reaction  . Plavix [Clopidogrel Bisulfate] Other (See Comments)    Unknown reaction  . Statins     Unknown reaction       The results of significant diagnostics from this hospitalization (including imaging, microbiology, ancillary and laboratory) are listed below for reference.    Significant Diagnostic Studies: Dg Chest 2 View  06/02/2015  CLINICAL DATA:  82 year old female with a history of atrial fibrillation. Fever and confusion EXAM: CHEST - 2 VIEW COMPARISON:  Chest x-ray 05/02/2015, abdominal CT 03/12/2015 FINDINGS: Cardiomediastinal silhouette unchanged with cardiomegaly. Atherosclerotic calcifications of the aortic arch. Surgical changes of median sternotomy. Low lung volumes persist with asymmetric elevation the right hemidiaphragm. Linear opacities at the bilateral bases,  unchanged from the comparison plain film. No pneumothorax. No confluent airspace disease. Configuration of the central vasculature unchanged. No displaced fracture. Configuration of thoracic vertebral bodies unchanged. Unremarkable appearance of the upper abdomen. IMPRESSION: Low lung volumes with linear opacities at the bilateral lung bases, unchanged from prior, potentially atelectasis/scarring, or atypical infection. Atherosclerosis with surgical changes of prior CABG. Signed, Dulcy Fanny. Earleen Newport, DO [facility] Electronically Signed   By: Corrie Mckusick D.O.   On: 06/02/2015 07:56    Microbiology: Recent Results (from the past 240 hour(s))  Culture, blood (routine x 2)     Status: None (Preliminary result)   Collection Time: 06/02/15  7:00 AM  Result Value Ref Range Status   Specimen Description BLOOD RIGHT HAND  Final   Special Requests BOTTLES DRAWN AEROBIC AND ANAEROBIC 5CC  Final   Culture   Final    NO GROWTH 2 DAYS Performed at [facility]    Report Status PENDING  Incomplete  Culture, blood (routine x 2)     Status: None (Preliminary result)   Collection Time: 06/02/15  7:10 AM  Result Value Ref Range Status   Specimen Description BLOOD LEFT HAND  Final   Special Requests BOTTLES DRAWN AEROBIC AND ANAEROBIC 5CC  Final   Culture   Final    NO GROWTH 2 DAYS Performed at [facility]    Report Status PENDING  Incomplete  Urine culture     Status: None   Collection Time: 06/02/15  7:55 AM  Result Value Ref Range Status   Specimen Description URINE, CATHETERIZED  Final   Special Requests NONE  Final   Culture   Final    >=100,000 COLONIES/mL PROTEUS MIRABILIS Performed at [facility]    Report Status 06/04/2015 FINAL  Final   Organism ID, Bacteria PROTEUS MIRABILIS  Final      Susceptibility   Proteus mirabilis - MIC*    AMPICILLIN <=2 SENSITIVE Sensitive     CEFAZOLIN <=4 SENSITIVE Sensitive      CEFTRIAXONE <=1 SENSITIVE Sensitive     CIPROFLOXACIN <=0.25 SENSITIVE Sensitive     GENTAMICIN <=1 SENSITIVE Sensitive     IMIPENEM 2 SENSITIVE Sensitive     NITROFURANTOIN 128 RESISTANT Resistant     TRIMETH/SULFA <=20 SENSITIVE Sensitive  AMPICILLIN/SULBACTAM <=2 SENSITIVE Sensitive     PIP/TAZO <=4 SENSITIVE Sensitive     * >=100,000 COLONIES/mL PROTEUS MIRABILIS     Labs: Basic Metabolic Panel:  Recent Labs Lab 06/01/15 1513 06/02/15 0708 06/02/15 0717 06/03/15 0343 06/04/15 0354 06/05/15 0326  NA 133* 135 139 137 133* 140  K 3.9 4.1 4.2 4.3 4.2 4.5  CL  --  106 108 109 106 113*  CO2 21* 18*  --  20* 20* 21*  GLUCOSE 82 152* 154* 102* 113* 128*  BUN 27.5* 33* 31* 29* 29* 28*  CREATININE 1.2* 1.46* 1.30* 1.17* 1.18* 1.08*  CALCIUM 9.2 8.9  --  8.4* 8.2* 8.7*   Liver Function Tests:  Recent Labs Lab 06/01/15 1513 06/02/15 0708  AST 15 26  ALT <9 10*  ALKPHOS 53 45  BILITOT 0.54 0.7  PROT 10.0* 9.4*  ALBUMIN 3.1* 3.3*   No results for input(s): LIPASE, AMYLASE in the last 168 hours. No results for input(s): AMMONIA in the last 168 hours. CBC:  Recent Labs Lab 06/01/15 1513  06/02/15 0708 06/02/15 0717 06/03/15 0343 06/04/15 0354 06/04/15 1807 06/05/15 0326  WBC 4.2  --  2.5*  --  2.3* 2.4*  --  2.7*  NEUTROABS 2.8  --  1.7  --   --   --   --   --   HGB 8.3*  < > 7.8* 8.5* 7.3* 7.2* 8.4* 9.3*  HCT 25.4*  < > 24.3* 25.0* 23.1* 22.8* 26.1* 28.0*  MCV 94.1  --  95.7  --  96.7 95.4  --  92.7  PLT 98*  --  95*  --  73* 82*  --  88*  < > = values in this interval not displayed. Cardiac Enzymes: No results for input(s): CKTOTAL, CKMB, CKMBINDEX, TROPONINI in the last 168 hours. BNP: BNP (last 3 results)  Recent Labs  08/13/14 1216 10/09/14 1524  BNP 150.0* 537.4*    ProBNP (last 3 results) No results for input(s): PROBNP in the last 8760 hours.  CBG: No results for input(s): GLUCAP in the last 168 hours.     SignedDebbe Odea,  MD [facility] 06/05/2015, 10:38 AM

## 2015-06-07 LAB — CULTURE, BLOOD (ROUTINE X 2)
CULTURE: NO GROWTH
Culture: NO GROWTH

## 2015-06-13 ENCOUNTER — Telehealth: Payer: Self-pay | Admitting: *Deleted

## 2015-06-13 NOTE — Telephone Encounter (Signed)
Per daughters voice message, patient is needing Hospice at this time. Per Dr. Alen Blew, it's OK to refer to Hospice. Referral made to Hospice.

## 2015-06-15 ENCOUNTER — Encounter: Payer: Self-pay | Admitting: *Deleted

## 2015-06-15 NOTE — Progress Notes (Signed)
Faxed note to [facility] re: refill of revlimid, to cancel script, patient on hospice

## 2015-06-29 ENCOUNTER — Other Ambulatory Visit (HOSPITAL_BASED_OUTPATIENT_CLINIC_OR_DEPARTMENT_OTHER): Payer: Medicare Other

## 2015-06-29 ENCOUNTER — Telehealth: Payer: Self-pay | Admitting: Oncology

## 2015-06-29 ENCOUNTER — Ambulatory Visit (HOSPITAL_BASED_OUTPATIENT_CLINIC_OR_DEPARTMENT_OTHER): Payer: Medicare Other | Admitting: Oncology

## 2015-06-29 VITALS — BP 121/62 | HR 61 | Temp 98.1°F | Resp 18 | Ht 62.0 in | Wt 127.8 lb

## 2015-06-29 DIAGNOSIS — C9000 Multiple myeloma not having achieved remission: Secondary | ICD-10-CM

## 2015-06-29 DIAGNOSIS — G8929 Other chronic pain: Secondary | ICD-10-CM

## 2015-06-29 DIAGNOSIS — C9002 Multiple myeloma in relapse: Secondary | ICD-10-CM

## 2015-06-29 DIAGNOSIS — D649 Anemia, unspecified: Secondary | ICD-10-CM

## 2015-06-29 DIAGNOSIS — D6959 Other secondary thrombocytopenia: Secondary | ICD-10-CM

## 2015-06-29 LAB — CBC WITH DIFFERENTIAL/PLATELET
BASO%: 0.5 % (ref 0.0–2.0)
Basophils Absolute: 0 10*3/uL (ref 0.0–0.1)
EOS ABS: 0 10*3/uL (ref 0.0–0.5)
EOS%: 0.3 % (ref 0.0–7.0)
HEMATOCRIT: 25 % — AB (ref 34.8–46.6)
HEMOGLOBIN: 8.1 g/dL — AB (ref 11.6–15.9)
LYMPH#: 1.9 10*3/uL (ref 0.9–3.3)
LYMPH%: 31.4 % (ref 14.0–49.7)
MCH: 31.6 pg (ref 25.1–34.0)
MCHC: 32.5 g/dL (ref 31.5–36.0)
MCV: 97.2 fL (ref 79.5–101.0)
MONO#: 0.5 10*3/uL (ref 0.1–0.9)
MONO%: 8.2 % (ref 0.0–14.0)
NEUT%: 59.6 % (ref 38.4–76.8)
NEUTROS ABS: 3.6 10*3/uL (ref 1.5–6.5)
PLATELETS: 149 10*3/uL (ref 145–400)
RBC: 2.58 10*6/uL — AB (ref 3.70–5.45)
RDW: 21.9 % — AB (ref 11.2–14.5)
WBC: 6.1 10*3/uL (ref 3.9–10.3)

## 2015-06-29 NOTE — Telephone Encounter (Signed)
gave and printed appt sched and avs for pt for may... °

## 2015-06-29 NOTE — Progress Notes (Signed)
Hematology and Oncology Follow Up Visit  [name] [id number] 1931/07/08 82 y.o. 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 She was found to have IgG level of 3430 and an M spike of 2.9 g/dL on 07/20/2014. Bone marrow biopsy on 08/30/2014 showed 78% plasma cell dyscrasia.   Prior Therapy:  Status post packed red cell transfusions during hospitalization in February 2016. This was repeated in May 2016.  She is status post a bone marrow biopsy on 08/30/2014. Systemic therapy with Velcade and dexamethasone weekly regimen starting on 09/22/2014. Therapy discontinued in 04/2015 due to progression of disease  Revlimid 10 mg daily for 21 days with dexamethasone to start in January 2017. Therapy has been on hold since 05/02/2015 after completing 1 cycle of therapy. Therapy was withheld because of pancytopenia and possible pneumonia.   Current therapy: She is currently on treatment holiday and supportive care only.  Interim History: [name] presents today for a follow-up visit accompanied by her daughter. Since the last visit, she was hospitalized towards the end of February and was discharged on 06/05/2015. After her discharge, she experienced significant decline in her performance status and activity level and contemplated hospice enrollment but have recovered the last week or so. Appetite have picked up and she is thriving a little bit more. His pain have been chronic in nature and have not changed dramatically.   Her mobility is limited and her performance status have been poor. She sleeps most of the day but she does have periods of our be more awake and more interactive.  She does not report any headaches, blurry vision, syncope or seizures. She does not report any fevers, chills, sweats. She does not report any chest pain, palpitation orthopnea. Does not report any cough, hemoptysis  or hematemesis. She does not report any vomiting, hematochezia or melena. She does not report any frequency urgency or hesitancy. She does report chronic pain is rather diffuse.. Remaining review of systems unremarkable.   Medications: I have reviewed the patient's current medications.  Current Outpatient Prescriptions  Medication Sig Dispense Refill  . acetaminophen (TYLENOL) 325 MG tablet Take 2 tablets (650 mg total) by mouth every 6 (six) hours as needed for mild pain (or Fever >/= 101). 60 tablet 0  . albuterol-ipratropium (COMBIVENT) 18-103 MCG/ACT inhaler Inhale 1-2 puffs into the lungs every 4 (four) hours as needed for wheezing or shortness of breath.    [name] amLODipine (NORVASC) 10 MG tablet Take 1 tablet (10 mg total) by mouth daily. 30 tablet 0  . aspirin 325 MG tablet Take 325 mg by mouth daily.    . cholecalciferol (VITAMIN D) 1000 UNITS tablet Take 1,000 Units by mouth every morning.     . ezetimibe (ZETIA) 10 MG tablet Take 1 tablet (10 mg total) by mouth at bedtime.    . feeding supplement, ENSURE ENLIVE, (ENSURE ENLIVE) LIQD Take 237 mLs by mouth 3 (three) times daily between meals. 237 mL 12  . gabapentin (NEURONTIN) 100 MG capsule Take 100 mg by mouth every morning.     . isosorbide mononitrate (IMDUR) 30 MG 24 hr tablet Take 1 tablet (30 mg total) by mouth daily. 30 tablet 3  . lactose free nutrition (BOOST PLUS) LIQD Take 237 mLs by mouth 3 (three) times daily with meals.  0  . megestrol (MEGACE ORAL) 40 MG/ML suspension Take 10 mLs (400 mg total) by mouth 2 (  two) times daily. 240 mL 0  . metoprolol succinate (TOPROL-XL) 25 MG 24 hr tablet Take 1 tablet (25 mg total) by mouth daily. 30 tablet 3  . nitroGLYCERIN (NITROSTAT) 0.4 MG SL tablet Place 0.4 mg under the tongue every 5 (five) minutes as needed for chest pain. Reported on 04/20/2015    . pantoprazole (PROTONIX) 40 MG tablet Take 1 tablet (40 mg total) by mouth daily. 30 tablet 0  . polyethylene glycol powder  (GLYCOLAX/MIRALAX) powder Take 17 g by mouth daily as needed for mild constipation or moderate constipation. Mix with 8 oz liquid and drink  0  . sennosides-docusate sodium (SENOKOT-S) 8.6-50 MG tablet Take 2 tablets by mouth daily as needed for constipation.    [name] tiotropium (SPIRIVA) 18 MCG inhalation capsule Place 18 mcg into inhaler and inhale daily as needed (shortness of breath).     . traMADol (ULTRAM) 50 MG tablet Take 1 tablet (50 mg total) by mouth every 6 (six) hours as needed for moderate pain. 60 tablet 0   No current facility-administered medications for this visit.     Allergies:  Allergies  Allergen Reactions  . Lexapro [Escitalopram Oxalate] Other (See Comments)    Fatigue   . Soma [Carisoprodol] Other (See Comments)    Fatigue   . Wellbutrin [Bupropion] Other (See Comments)    "couldn't function and take it"  . Albuterol Sulfate Other (See Comments)    Albuterol HFA inhaler caused nervousness   . Codeine Hives  . Levaquin [Levofloxacin In D5w] Other (See Comments)    Unknown allergic reaction  . Plavix [Clopidogrel Bisulfate] Other (See Comments)    Unknown reaction  . Statins     Unknown reaction     Past Medical History, Surgical history, Social history, and Family History were reviewed and updated.   Physical Exam: Blood pressure 121/62, pulse 61, temperature 98.1 F (36.7 C), temperature source Oral, resp. rate 18, height '5\' 2"'$  (1.575 m), weight 127 lb 12.8 oz (57.97 kg), SpO2 100 %. ECOG: 2 General appearance:  Chronically ill-appearing woman More alert and interactive today. Head: Normocephalic, without obvious abnormality no oral ulcers or lesions. Neck: no adenopathy no thyroid masses. Lymph nodes: Cervical, supraclavicular, and axillary nodes normal. Heart:regular rate and rhythm, S1, S2 normal, no murmur, click, rub or gallop Lung:chest clear, rales, normal symmetric air entry. No wheezes or dullness to percussion. Abdomin: soft, non-tender,  without masses or organomegaly no shifting dullness or ascites. EXT:no erythema, induration, or nodules   Lab Results: Lab Results  Component Value Date   WBC 6.1 06/29/2015   HGB 8.1* 06/29/2015   HCT 25.0* 06/29/2015   MCV 97.2 06/29/2015   PLT 149 06/29/2015     Chemistry      Component Value Date/Time   NA 140 06/05/2015 0326   NA 133* 06/01/2015 1513   K 4.5 06/05/2015 0326   K 3.9 06/01/2015 1513   CL 113* 06/05/2015 0326   CO2 21* 06/05/2015 0326   CO2 21* 06/01/2015 1513   BUN 28* 06/05/2015 0326   BUN 27.5* 06/01/2015 1513   CREATININE 1.08* 06/05/2015 0326   CREATININE 1.2* 06/01/2015 1513      Component Value Date/Time   CALCIUM 8.7* 06/05/2015 0326   CALCIUM 9.2 06/01/2015 1513   ALKPHOS 45 06/02/2015 0708   ALKPHOS 53 06/01/2015 1513   AST 26 06/02/2015 0708   AST 15 06/01/2015 1513   ALT 10* 06/02/2015 0708   ALT <9 06/01/2015 1513   BILITOT  0.7 06/02/2015 0708   BILITOT 0.54 06/01/2015 1513            Impression and Plan:  82 year old woman with the following issues:  1. IgG kappa plasma cell disorder with 78% plasma cell infiltration in the bone marrow. She is quite symptomatic and certainly requires treatment. She meets the criteria of multiple myeloma given her anemia and overall symptomatology.  She is S/P Velcade and dexamethasone for 6 months with a partial response but subsequently developed relapsed disease.  After 1 cycle of Revlimid at 10 mg daily with dexamethasone therapy was interrupted because of cytopenias and pneumonia.   Given her overall functional decline, poor performance status and progression of her cancer I have recommended hospice care moving forward. I do not think she is a candidate for any therapy given her overall profound decline. She is agreeable to enroll in hospice and she understands that she has limited life expectancy of less than 6 months.   2. Anemia: Her hemoglobin is stable and asymptomatic. I have  advised against transfusions.  3. Weight loss: Appetite is reasonable at this time.  4. Thrombocytopenia: Related to Revlimid appears to be improving.  5. VZV prophylaxis: I have recommended discontinuation of acyclovir at this time.  6. Pain: Control at this time with tramadol.  7. Follow-up: Will be in 2 months or as needed. We will rely on hospice for updates in the interim.     [facility], MD 3/24/20174:03 PM

## 2015-06-29 NOTE — Progress Notes (Signed)
Per Dr. Alen Blew, I made another referral to Hospice today.

## 2015-07-04 ENCOUNTER — Encounter: Payer: Self-pay | Admitting: *Deleted

## 2015-07-04 NOTE — Progress Notes (Signed)
Daughter donna calling, to ask when hospice will be coming. Spoke with sandra at [facility]. She states that patient was at a nursing home and the family was to have a meeting and let the nurse know. Hospice was never called. This R.N. Called hospice back and gave verbal order to donna that dr Alen Blew will be the attending, hospice M.D.'s to do symptom management, and have DNR signed, if family wishes.

## 2015-07-09 ENCOUNTER — Telehealth: Payer: Self-pay | Admitting: *Deleted

## 2015-07-09 ENCOUNTER — Other Ambulatory Visit: Payer: Self-pay | Admitting: *Deleted

## 2015-07-09 DIAGNOSIS — C9002 Multiple myeloma in relapse: Secondary | ICD-10-CM

## 2015-07-09 MED ORDER — GABAPENTIN 100 MG PO CAPS
100.0000 mg | ORAL_CAPSULE | Freq: Every day | ORAL | Status: AC
Start: 2015-07-09 — End: ?

## 2015-07-09 NOTE — Telephone Encounter (Signed)
FYI Call from [facility] with [facility].  Calling requesting "what Hospice information you have listed in her records.  She doesn't know, nor does she have a phone number to contact a nurse if needed so I will call to have them re-educate her.  I've called [place], [place] and more and she is not under them.  I learned there is a [facility] but do not have a number for them."  This nurse provided [facility].number.  Called [place] leaving message with the Hospice Medical Records Team to confirm status.  Added "Hospice Patient" to general FYI flags for alert staff.

## 2015-07-30 ENCOUNTER — Encounter: Payer: Self-pay | Admitting: *Deleted

## 2015-07-30 ENCOUNTER — Other Ambulatory Visit: Payer: Self-pay | Admitting: *Deleted

## 2015-07-30 MED ORDER — EZETIMIBE 10 MG PO TABS: 10.0000 mg | ORAL_TABLET | Freq: Every day | ORAL | Status: AC

## 2015-07-30 NOTE — Telephone Encounter (Signed)
Refilled zetia per hospice nurse keisha's request. Informed her that hospice physicians may do symptom management.

## 2015-08-13 ENCOUNTER — Encounter: Payer: Self-pay | Admitting: *Deleted

## 2015-08-13 NOTE — Progress Notes (Signed)
Received voice message from Hospice nurse at [facility] that patient passed away on 2022-08-14 at 10:22 am.

## 2015-08-24 ENCOUNTER — Ambulatory Visit: Payer: Medicare Other | Admitting: Oncology

## 2015-09-06 DEATH — deceased

## 2016-03-21 ENCOUNTER — Other Ambulatory Visit: Payer: Self-pay | Admitting: Nurse Practitioner

## 2016-07-12 IMAGING — NM NM PULMONARY VENT & PERF
16 series · 16 of 16 positions shown · non-contrast
Comparison: Chest x-ray from a skeletal survey dated July 20, 2014

CLINICAL DATA: Chest pain

EXAM:
NUCLEAR MEDICINE VENTILATION - PERFUSION LUNG SCAN
TECHNIQUE: Ventilation images were obtained in multiple projections using
inhaled aerosol Gc-EEm DTPA. Perfusion images were obtained in
multiple projections after intravenous injection of Gc-EEm MAA.
RADIOPHARMACEUTICALS:  43.0 mCi Uechnetium-SSm DTPA aerosol
inhalation and 6.38 mCi chest x-ray from a skeletal survey dated
July 20, 2014 Uechnetium-SSm MAA IV

[Series 1: ant post vent · 2.07mm/px · 1 of 1 slices shown (1 of 2)]
[im 1/1]
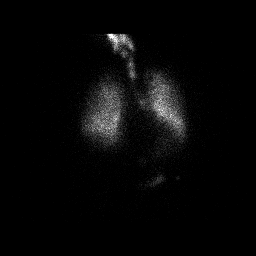

[Series 1: ant post vent · 2.07mm/px · 1 of 1 slices shown (2 of 2)]
[im 1/1  full-range]
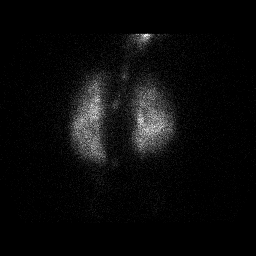

[Series 2: lao-rpo vent · 2.07mm/px · 1 of 1 slices shown (1 of 2)]
[im 1/1  full-range]
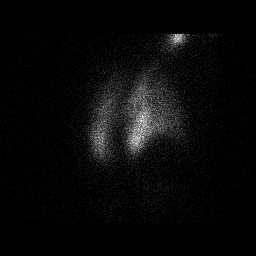

[Series 2: lao-rpo vent · 2.07mm/px · 1 of 1 slices shown (2 of 2)]
[im 1/1]
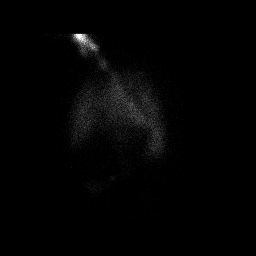

[Series 3: llat-rlat vent · 2.07mm/px · 1 of 1 slices shown (1 of 2)]
[im 1/1]
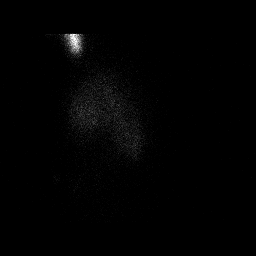

[Series 3: llat-rlat vent · 2.07mm/px · 1 of 1 slices shown (2 of 2)]
[im 1/1]
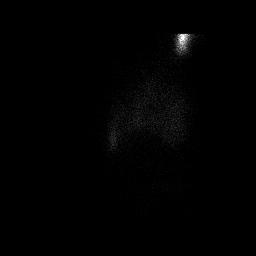

[Series 4: lpo-rao vent · 2.07mm/px · 1 of 1 slices shown (1 of 2)]
[im 1/1]
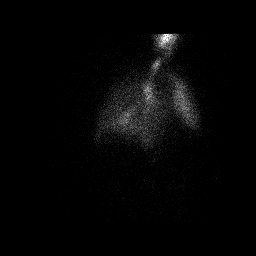

[Series 4: lpo-rao vent · 2.07mm/px · 1 of 1 slices shown (2 of 2)]
[im 1/1  full-range]
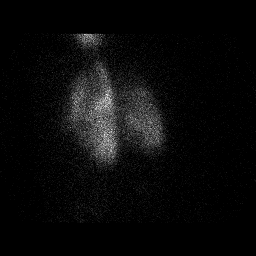

[Series 5: lpo-rao perf · 2.07mm/px · 1 of 1 slices shown (1 of 2)]
[im 1/1]
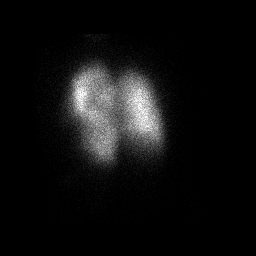

[Series 5: lpo-rao perf · 2.07mm/px · 1 of 1 slices shown (2 of 2)]
[im 1/1]
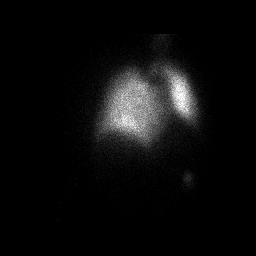

[Series 6: llat-rlat perf · 2.07mm/px · 1 of 1 slices shown (1 of 2)]
[im 1/1]
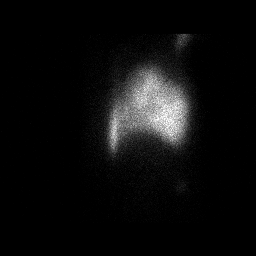

[Series 6: llat-rlat perf · 2.07mm/px · 1 of 1 slices shown (2 of 2)]
[im 1/1]
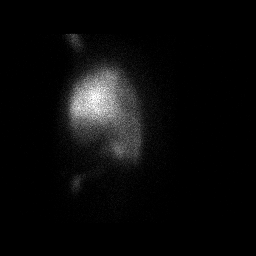

[Series 7: lao-rpo perf · 2.07mm/px · 1 of 1 slices shown (1 of 2)]
[im 1/1]
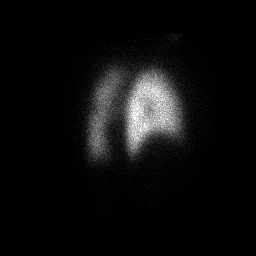

[Series 7: lao-rpo perf · 2.07mm/px · 1 of 1 slices shown (2 of 2)]
[im 1/1]
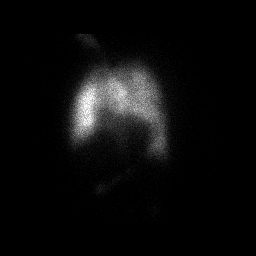

[Series 8: ant post perf · 2.07mm/px · 1 of 1 slices shown (1 of 2)]
[im 1/1]
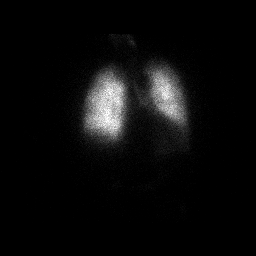

[Series 8: ant post perf · 2.07mm/px · 1 of 1 slices shown (2 of 2)]
[im 1/1]
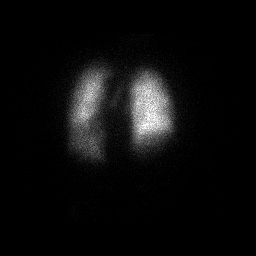

[16 of 16 positions shown; findings below may reference images not displayed]

FINDINGS: Ventilation: No focal ventilation defect.

Perfusion: No wedge shaped peripheral perfusion defects to suggest
acute pulmonary embolism.
IMPRESSION: Findings consistent with a low probability for acute pulmonary
embolism.
